# Patient Record
Sex: Male | Born: 1940
Health system: Southern US, Community
[De-identification: ages and names within clinical notes are randomized; demographics above are authoritative.]

## PROBLEM LIST (undated history)

## (undated) DIAGNOSIS — Z87442 Personal history of urinary calculi: Secondary | ICD-10-CM

## (undated) DIAGNOSIS — Z9221 Personal history of antineoplastic chemotherapy: Secondary | ICD-10-CM

## (undated) DIAGNOSIS — Z973 Presence of spectacles and contact lenses: Secondary | ICD-10-CM

## (undated) DIAGNOSIS — K219 Gastro-esophageal reflux disease without esophagitis: Secondary | ICD-10-CM

## (undated) DIAGNOSIS — I739 Peripheral vascular disease, unspecified: Secondary | ICD-10-CM

## (undated) DIAGNOSIS — J189 Pneumonia, unspecified organism: Secondary | ICD-10-CM

## (undated) DIAGNOSIS — C3490 Malignant neoplasm of unspecified part of unspecified bronchus or lung: Secondary | ICD-10-CM

## (undated) DIAGNOSIS — M1990 Unspecified osteoarthritis, unspecified site: Secondary | ICD-10-CM

## (undated) DIAGNOSIS — R32 Unspecified urinary incontinence: Secondary | ICD-10-CM

## (undated) DIAGNOSIS — H9190 Unspecified hearing loss, unspecified ear: Secondary | ICD-10-CM

## (undated) DIAGNOSIS — R0600 Dyspnea, unspecified: Secondary | ICD-10-CM

## (undated) DIAGNOSIS — Z8709 Personal history of other diseases of the respiratory system: Secondary | ICD-10-CM

## (undated) DIAGNOSIS — I6381 Other cerebral infarction due to occlusion or stenosis of small artery: Secondary | ICD-10-CM

## (undated) DIAGNOSIS — Z720 Tobacco use: Secondary | ICD-10-CM

## (undated) DIAGNOSIS — I2510 Atherosclerotic heart disease of native coronary artery without angina pectoris: Secondary | ICD-10-CM

## (undated) DIAGNOSIS — I7121 Aneurysm of the ascending aorta, without rupture: Secondary | ICD-10-CM

## (undated) DIAGNOSIS — I10 Essential (primary) hypertension: Secondary | ICD-10-CM

## (undated) DIAGNOSIS — N189 Chronic kidney disease, unspecified: Secondary | ICD-10-CM

## (undated) DIAGNOSIS — E119 Type 2 diabetes mellitus without complications: Secondary | ICD-10-CM

## (undated) DIAGNOSIS — E785 Hyperlipidemia, unspecified: Secondary | ICD-10-CM

## (undated) DIAGNOSIS — I214 Non-ST elevation (NSTEMI) myocardial infarction: Secondary | ICD-10-CM

## (undated) DIAGNOSIS — I712 Thoracic aortic aneurysm, without rupture: Secondary | ICD-10-CM

## (undated) HISTORY — DX: Tobacco use: Z72.0

## (undated) HISTORY — DX: Atherosclerotic heart disease of native coronary artery without angina pectoris: I25.10

## (undated) HISTORY — PX: COLONOSCOPY: SHX174

## (undated) HISTORY — DX: Peripheral vascular disease, unspecified: I73.9

## (undated) HISTORY — DX: Type 2 diabetes mellitus without complications: E11.9

## (undated) HISTORY — DX: Hyperlipidemia, unspecified: E78.5

## (undated) HISTORY — PX: APPENDECTOMY: SHX54

## (undated) HISTORY — DX: Essential (primary) hypertension: I10

---
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
## 1995-07-21 DIAGNOSIS — I2510 Atherosclerotic heart disease of native coronary artery without angina pectoris: Secondary | ICD-10-CM

## 1995-07-21 HISTORY — PX: CORONARY ANGIOPLASTY: SHX604

## 1995-07-21 HISTORY — DX: Atherosclerotic heart disease of native coronary artery without angina pectoris: I25.10

## 2008-03-14 ENCOUNTER — Encounter: Admission: RE | Admit: 2008-03-14 | Discharge: 2008-03-14 | Payer: Self-pay | Admitting: Internal Medicine

## 2012-08-25 ENCOUNTER — Other Ambulatory Visit (HOSPITAL_COMMUNITY): Payer: Self-pay | Admitting: Cardiovascular Disease

## 2012-08-25 DIAGNOSIS — I739 Peripheral vascular disease, unspecified: Secondary | ICD-10-CM

## 2012-09-13 ENCOUNTER — Encounter: Payer: Self-pay | Admitting: Cardiology

## 2012-09-21 ENCOUNTER — Encounter (HOSPITAL_COMMUNITY): Payer: Self-pay

## 2012-10-13 ENCOUNTER — Encounter: Payer: Self-pay | Admitting: Cardiovascular Disease

## 2013-04-24 ENCOUNTER — Other Ambulatory Visit: Payer: Self-pay | Admitting: Cardiovascular Disease

## 2013-04-25 NOTE — Telephone Encounter (Signed)
Rx was sent to pharmacy electronically. 

## 2013-05-11 ENCOUNTER — Other Ambulatory Visit: Payer: Self-pay | Admitting: Cardiovascular Disease

## 2013-05-12 NOTE — Telephone Encounter (Signed)
Rx was sent to pharmacy electronically. 

## 2013-09-04 ENCOUNTER — Other Ambulatory Visit: Payer: Self-pay | Admitting: Cardiovascular Disease

## 2013-09-07 ENCOUNTER — Other Ambulatory Visit: Payer: Self-pay

## 2013-09-07 MED ORDER — LOSARTAN POTASSIUM-HCTZ 100-25 MG PO TABS: 1.0000 | ORAL_TABLET | Freq: Every day | ORAL | Status: DC

## 2013-09-07 NOTE — Telephone Encounter (Signed)
Rx was sent to pharmacy electronically. 

## 2013-09-18 ENCOUNTER — Other Ambulatory Visit: Payer: Self-pay | Admitting: Cardiovascular Disease

## 2013-09-18 NOTE — Telephone Encounter (Signed)
E-sent rx ; For 2 more refill, appointment on 09/20/13

## 2013-09-20 ENCOUNTER — Encounter: Payer: Self-pay | Admitting: Cardiovascular Disease

## 2013-09-20 ENCOUNTER — Ambulatory Visit (INDEPENDENT_AMBULATORY_CARE_PROVIDER_SITE_OTHER): Payer: Medicare Other | Admitting: Cardiovascular Disease

## 2013-09-20 VITALS — BP 138/82 | HR 64 | Ht 70.0 in | Wt 179.0 lb

## 2013-09-20 DIAGNOSIS — E785 Hyperlipidemia, unspecified: Secondary | ICD-10-CM

## 2013-09-20 DIAGNOSIS — Z794 Long term (current) use of insulin: Secondary | ICD-10-CM

## 2013-09-20 DIAGNOSIS — I2510 Atherosclerotic heart disease of native coronary artery without angina pectoris: Secondary | ICD-10-CM | POA: Insufficient documentation

## 2013-09-20 DIAGNOSIS — I739 Peripheral vascular disease, unspecified: Secondary | ICD-10-CM | POA: Insufficient documentation

## 2013-09-20 DIAGNOSIS — IMO0001 Reserved for inherently not codable concepts without codable children: Secondary | ICD-10-CM | POA: Insufficient documentation

## 2013-09-20 DIAGNOSIS — Z9861 Coronary angioplasty status: Secondary | ICD-10-CM

## 2013-09-20 DIAGNOSIS — I10 Essential (primary) hypertension: Secondary | ICD-10-CM | POA: Insufficient documentation

## 2013-09-20 DIAGNOSIS — E119 Type 2 diabetes mellitus without complications: Secondary | ICD-10-CM

## 2013-09-20 NOTE — Assessment & Plan Note (Signed)
Well-controlled on current medications 

## 2013-09-20 NOTE — Assessment & Plan Note (Signed)
On statin therapy followed by his PCP 

## 2013-09-20 NOTE — Patient Instructions (Addendum)
  We will see you back in follow up in 1 year with Dr Gwenlyn Found  Dr Gwenlyn Found has ordered lower extremity arterial dopplers

## 2013-09-20 NOTE — Progress Notes (Signed)
09/20/2013 Arthur [name]   02-17-1941  [id number]  Primary Physician Salena Saner., MD Primary Cardiologist: Lorretta Harp MD [name]   HPI:  The patient is a 73 year old, mildly overweight, married Caucasian male, father of 16, grandfather to 62 grandchildren who I saw a year ago. He has a history of CAD status post circumflex intervention by myself May 20, 1996. He also had a 60% ostial ramus branch stenosis, a 70% distal RCA stenosis and normal LV function. His last functional study performed August 15, 2010, was nonischemic. He does continue to smoke 3 cigarettes a day. His other problems include hypertension, hyperlipidemia and noninsulin-requiring diabetes. Dr. Karlton Lemon follows his lipid profile. Since I saw him he ago he has complained of new onset bilateral calf claudication.    Current Outpatient Prescriptions  Medication Sig Dispense Refill  . aspirin 325 MG EC tablet Take 325 mg by mouth daily.      Marland Kitchen atorvastatin (LIPITOR) 20 MG tablet TAKE 1 TABLET EVERY DAY  30 tablet  2  . cholecalciferol (VITAMIN D) 1000 UNITS tablet Take 1,000 Units by mouth daily.      Marland Kitchen doxazosin (CARDURA) 1 MG tablet Take 1 mg by mouth 2 (two) times daily.      . fenofibrate 160 MG tablet TAKE 1 TABLET BY MOUTH AT BEDTIME FOR BLOOD FAT  30 tablet  2  . fish oil-omega-3 fatty acids 1000 MG capsule Take 4 g by mouth daily.      . insulin detemir (LEVEMIR) 100 UNIT/ML injection Inject 30 Units into the skin at bedtime.      Marland Kitchen losartan-hydrochlorothiazide (HYZAAR) 100-25 MG per tablet Take 1 tablet by mouth daily.  30 tablet  0  . metoprolol (LOPRESSOR) 50 MG tablet TAKE 1 TABLET BY MOUTH TWICE A DAY  60 tablet  0   No current facility-administered medications for this visit.    Not on File  History   Social History  . Marital Status: Unknown    Spouse Name: N/A    Number of Children: N/A  . Years of Education: N/A   Occupational History  . Not on file.   Social  History Main Topics  . Smoking status: Current Every Day Smoker -- 0.50 packs/day    Types: Cigarettes  . Smokeless tobacco: Not on file     Comment: 3 cigs a day  . Alcohol Use: No  . Drug Use: No  . Sexual Activity: Not on file   Other Topics Concern  . Not on file   Social History Narrative   Retired from [employer]. Married 2 children, 11 grand children, 6 great grand children     Review of Systems: General: negative for chills, fever, night sweats or weight changes.  Cardiovascular: negative for chest pain, dyspnea on exertion, edema, orthopnea, palpitations, paroxysmal nocturnal dyspnea or shortness of breath Dermatological: negative for rash Respiratory: negative for cough or wheezing Urologic: negative for hematuria Abdominal: negative for nausea, vomiting, diarrhea, bright red blood per rectum, melena, or hematemesis Neurologic: negative for visual changes, syncope, or dizziness All other systems reviewed and are otherwise negative except as noted above.    Blood pressure 138/82, pulse 64, height 5\' 10"  (1.778 m), weight 81.194 kg (179 lb).  General appearance: alert and no distress Neck: no adenopathy, no carotid bruit, no JVD, supple, symmetrical, trachea midline and thyroid not enlarged, symmetric, no tenderness/mass/nodules Lungs: clear to auscultation bilaterally Heart: regular rate and rhythm, S1, S2 normal, no murmur, click,  rub or gallop Extremities: extremities normal, atraumatic, no cyanosis or edema  EKG normal sinus rhythm at 64 without ST or T wave changes  ASSESSMENT AND PLAN:   Coronary artery disease Status post circumflex intervention by myself 05/20/96. He also has 60% ostial ramus branch stenosis and 70% distal RCA stenosis with normal LV function. A Myoview performed 08/15/10 with nonischemic. He denies chest pain or shortness of breath.  Essential hypertension Well-controlled on current medications  Hyperlipidemia On statin therapy followed by  his PCP  Claudication The patient relates symptoms compatible claudication while walking up a hill new since his prior office visit. He does have palpable pedal pulses. I'm going to get lower extremity arterial Doppler studies.      Lorretta Harp MD FACP,FACC,FAHA, [facility] 09/20/2013 5:16 PM

## 2013-09-20 NOTE — Assessment & Plan Note (Signed)
The patient relates symptoms compatible claudication while walking up a hill new since his prior office visit. He does have palpable pedal pulses. I'm going to get lower extremity arterial Doppler studies.

## 2013-09-20 NOTE — Assessment & Plan Note (Signed)
Status post circumflex intervention by myself 05/20/96. He also has 60% ostial ramus branch stenosis and 70% distal RCA stenosis with normal LV function. A Myoview performed 08/15/10 with nonischemic. He denies chest pain or shortness of breath.

## 2013-10-02 ENCOUNTER — Ambulatory Visit (HOSPITAL_COMMUNITY)
Admission: RE | Admit: 2013-10-02 | Discharge: 2013-10-02 | Disposition: A | Discharge status: Home / Self Care | Payer: Medicare Other | Source: Ambulatory Visit | Attending: Cardiovascular Disease | Admitting: Cardiovascular Disease

## 2013-10-02 ENCOUNTER — Other Ambulatory Visit: Payer: Self-pay | Admitting: *Deleted

## 2013-10-02 DIAGNOSIS — I70219 Atherosclerosis of native arteries of extremities with intermittent claudication, unspecified extremity: Secondary | ICD-10-CM | POA: Insufficient documentation

## 2013-10-02 DIAGNOSIS — I739 Peripheral vascular disease, unspecified: Secondary | ICD-10-CM

## 2013-10-02 MED ORDER — METOPROLOL TARTRATE 50 MG PO TABS: ORAL_TABLET | ORAL | Status: DC

## 2013-10-02 NOTE — Telephone Encounter (Signed)
Rx was sent to pharmacy electronically. 

## 2013-10-02 NOTE — Progress Notes (Signed)
Arterial Duplex Lower Ext. Completed. Kelle Ruppert, BS, RDMS, RVT  

## 2013-10-09 ENCOUNTER — Encounter: Payer: Self-pay | Admitting: *Deleted

## 2013-10-09 ENCOUNTER — Other Ambulatory Visit: Payer: Self-pay

## 2013-10-09 ENCOUNTER — Telehealth: Payer: Self-pay | Admitting: *Deleted

## 2013-10-09 DIAGNOSIS — I739 Peripheral vascular disease, unspecified: Secondary | ICD-10-CM

## 2013-10-09 MED ORDER — LOSARTAN POTASSIUM-HCTZ 100-25 MG PO TABS: 1.0000 | ORAL_TABLET | Freq: Every day | ORAL | Status: DC

## 2013-10-09 MED ORDER — ATORVASTATIN CALCIUM 20 MG PO TABS: 20.0000 mg | ORAL_TABLET | Freq: Every day | ORAL | Status: DC

## 2013-10-09 MED ORDER — METOPROLOL TARTRATE 50 MG PO TABS: ORAL_TABLET | ORAL | Status: DC

## 2013-10-09 MED ORDER — FENOFIBRATE 160 MG PO TABS: 160.0000 mg | ORAL_TABLET | Freq: Every day | ORAL | Status: DC

## 2013-10-09 NOTE — Telephone Encounter (Signed)
Message copied by Chauncy Lean on Mon Oct 09, 2013  9:59 PM ------      Message from: Lorretta Harp      Created: Mon Oct 09, 2013  7:06 AM       Nl ABIs. Mod distal LSFA stenosis that doesn't explain symptoms. Repeat 12 months ------

## 2013-10-09 NOTE — Telephone Encounter (Signed)
Rx was sent to pharmacy electronically. 

## 2013-10-09 NOTE — Addendum Note (Signed)
Addended by: Diana Eves on: 10/09/2013 04:11 PM   Modules accepted: Orders

## 2013-10-09 NOTE — Telephone Encounter (Signed)
Order placed for repeat lower extremity arterial doppler in 1 year

## 2014-04-30 ENCOUNTER — Other Ambulatory Visit: Payer: Self-pay | Admitting: Cardiovascular Disease

## 2014-04-30 NOTE — Telephone Encounter (Signed)
Rx was sent to pharmacy electronically. 

## 2014-07-23 ENCOUNTER — Other Ambulatory Visit: Payer: Self-pay | Admitting: Cardiovascular Disease

## 2014-09-12 ENCOUNTER — Other Ambulatory Visit: Payer: Self-pay | Admitting: Cardiovascular Disease

## 2014-09-12 NOTE — Telephone Encounter (Signed)
Rx(s) sent to pharmacy electronically. OV 09/21/14

## 2014-09-17 ENCOUNTER — Other Ambulatory Visit: Payer: Self-pay | Admitting: Cardiovascular Disease

## 2014-09-17 NOTE — Telephone Encounter (Signed)
Rx has been sent to the pharmacy electronically. ° °

## 2014-09-21 ENCOUNTER — Ambulatory Visit (INDEPENDENT_AMBULATORY_CARE_PROVIDER_SITE_OTHER): Payer: Medicare Other | Admitting: Cardiovascular Disease

## 2014-09-21 ENCOUNTER — Encounter: Payer: Self-pay | Admitting: Cardiovascular Disease

## 2014-09-21 VITALS — BP 140/62 | HR 61 | Ht 70.0 in | Wt 177.4 lb

## 2014-09-21 DIAGNOSIS — E785 Hyperlipidemia, unspecified: Secondary | ICD-10-CM

## 2014-09-21 DIAGNOSIS — I739 Peripheral vascular disease, unspecified: Secondary | ICD-10-CM

## 2014-09-21 DIAGNOSIS — I10 Essential (primary) hypertension: Secondary | ICD-10-CM | POA: Diagnosis not present

## 2014-09-21 DIAGNOSIS — I2583 Coronary atherosclerosis due to lipid rich plaque: Secondary | ICD-10-CM

## 2014-09-21 DIAGNOSIS — I2510 Atherosclerotic heart disease of native coronary artery without angina pectoris: Secondary | ICD-10-CM

## 2014-09-21 NOTE — Assessment & Plan Note (Signed)
History of hyperlipidemia on atorvastatin and fenofibrate followed by his PCP

## 2014-09-21 NOTE — Assessment & Plan Note (Signed)
History of hypertension with blood pressure measured at 140/62. He is on losartan hydrochlorothiazide, metoprolol and Cardura. Continue current meds at current dosing

## 2014-09-21 NOTE — Progress Notes (Signed)
09/21/2014 Arthur [name]   11-Aug-1940  [id number]  Primary Physician Salena Saner., MD Primary Cardiologist: Arthur Harp MD [name]   HPI:   The patient is a 74 year old, mildly overweight, married Caucasian male, father of 27, grandfather to 58 grandchildren who I saw a year ago. He has a history of CAD status post circumflex intervention by myself May 20, 1996. He also had a 60% ostial ramus branch stenosis, a 70% distal RCA stenosis and normal LV function. His last functional study performed August 15, 2010, was nonischemic. He does continue to smoke 3 cigarettes a day. His other problems include hypertension, hyperlipidemia and noninsulin-requiring diabetes. Dr. Karlton Lemon follows his lipid profile. Since I saw him he ago he has complained of new onset bilateral calf claudication. I obtained lower extremity arterial Doppler studies on 10/02/13 which revealed ABIs that were normal bilaterally with a high-frequency signal in his distal left SFA.   Current Outpatient Prescriptions  Medication Sig Dispense Refill  . aspirin 325 MG EC tablet Take 325 mg by mouth daily.    Marland Kitchen atorvastatin (LIPITOR) 20 MG tablet TAKE 1 TABLET (20 MG TOTAL) BY MOUTH DAILY. 90 tablet 0  . cholecalciferol (VITAMIN D) 1000 UNITS tablet Take 1,000 Units by mouth daily.    Marland Kitchen doxazosin (CARDURA) 1 MG tablet TAKE 2 TABLETS AT BEDTIME FOR BLOOD PRESSURE AND URINE FLOW 60 tablet 5  . fenofibrate 160 MG tablet Take 1 tablet (160 mg total) by mouth daily. 90 tablet 3  . fish oil-omega-3 fatty acids 1000 MG capsule Take 4 g by mouth daily.    . insulin detemir (LEVEMIR) 100 UNIT/ML injection Inject 30 Units into the skin 2 (two) times daily.     Marland Kitchen losartan-hydrochlorothiazide (HYZAAR) 100-25 MG per tablet Take 1 tablet by mouth daily. 90 tablet 3  . metoprolol (LOPRESSOR) 50 MG tablet TAKE 1 TABLET BY MOUTH TWICE A DAY 60 tablet 2   No current facility-administered medications for this visit.     No Known Allergies  History   Social History  . Marital Status: Unknown    Spouse Name: N/A  . Number of Children: N/A  . Years of Education: N/A   Occupational History  . Not on file.   Social History Main Topics  . Smoking status: Current Every Day Smoker -- 0.50 packs/day    Types: Cigarettes  . Smokeless tobacco: Not on file     Comment: 3 cigs a day  . Alcohol Use: No  . Drug Use: No  . Sexual Activity: Not on file   Other Topics Concern  . Not on file   Social History Narrative   Retired from [employer]. Married 2 children, 11 grand children, 6 great grand children     Review of Systems: General: negative for chills, fever, night sweats or weight changes.  Cardiovascular: negative for chest pain, dyspnea on exertion, edema, orthopnea, palpitations, paroxysmal nocturnal dyspnea or shortness of breath Dermatological: negative for rash Respiratory: negative for cough or wheezing Urologic: negative for hematuria Abdominal: negative for nausea, vomiting, diarrhea, bright red blood per rectum, melena, or hematemesis Neurologic: negative for visual changes, syncope, or dizziness All other systems reviewed and are otherwise negative except as noted above.    Blood pressure 140/62, pulse 61, height 5\' 10"  (1.778 m), weight 177 lb 6.4 oz (80.468 kg).  General appearance: alert and no distress Neck: no adenopathy, no carotid bruit, no JVD, supple, symmetrical, trachea midline and thyroid not enlarged, symmetric, no  tenderness/mass/nodules Lungs: clear to auscultation bilaterally Heart: regular rate and rhythm, S1, S2 normal, no murmur, click, rub or gallop Extremities: extremities normal, atraumatic, no cyanosis or edema  EKG normal sinus rhythm at 61 without ST or T-wave changes. I personally reviewed this EKG  ASSESSMENT AND PLAN:   Hyperlipidemia History of hyperlipidemia on atorvastatin and fenofibrate followed by his PCP   Essential hypertension History of  hypertension with blood pressure measured at 140/62. He is on losartan hydrochlorothiazide, metoprolol and Cardura. Continue current meds at current dosing   Coronary artery disease History of coronary artery disease status post circumflex intervention by myself 05/20/96. He did have a 50% ostial ramus branch stenosis, 70% distal RCA with normal LV function. His last ultrasound study performed 08/15/10 was nonischemic. He denies chest pain or shortness of breath   Claudication History of claudication with Dopplers performed 10/02/13 revealed a normal ABI in the right as well as the left with a high frequency signal in the distal left SFA. We will repeat lower extremity arterial Doppler studies       Arthur Harp MD [facility] 09/21/2014 10:36 AM

## 2014-09-21 NOTE — Assessment & Plan Note (Signed)
History of claudication with Dopplers performed 10/02/13 revealed a normal ABI in the right as well as the left with a high frequency signal in the distal left SFA. We will repeat lower extremity arterial Doppler studies

## 2014-09-21 NOTE — Assessment & Plan Note (Signed)
History of coronary artery disease status post circumflex intervention by myself 05/20/96. He did have a 50% ostial ramus branch stenosis, 70% distal RCA with normal LV function. His last ultrasound study performed 08/15/10 was nonischemic. He denies chest pain or shortness of breath

## 2014-09-21 NOTE — Patient Instructions (Signed)
Your physician wants you to follow-up in 1 year with Dr. Berry. You will receive a reminder letter in the mail 2 months in advance. If you do not receive a letter, please call our office to schedule the follow-up appointment.  

## 2014-09-24 ENCOUNTER — Other Ambulatory Visit: Payer: Self-pay | Admitting: *Deleted

## 2014-09-24 MED ORDER — LOSARTAN POTASSIUM-HCTZ 100-25 MG PO TABS: 1.0000 | ORAL_TABLET | Freq: Every day | ORAL | Status: DC

## 2014-09-24 MED ORDER — DOXAZOSIN MESYLATE 1 MG PO TABS: ORAL_TABLET | ORAL | Status: DC

## 2014-09-24 NOTE — Telephone Encounter (Signed)
[place] sent to pharmacy

## 2014-09-26 ENCOUNTER — Encounter: Payer: Self-pay | Admitting: Cardiovascular Disease

## 2014-09-27 DIAGNOSIS — Z125 Encounter for screening for malignant neoplasm of prostate: Secondary | ICD-10-CM | POA: Diagnosis not present

## 2014-09-27 DIAGNOSIS — I129 Hypertensive chronic kidney disease with stage 1 through stage 4 chronic kidney disease, or unspecified chronic kidney disease: Secondary | ICD-10-CM | POA: Diagnosis not present

## 2014-09-27 DIAGNOSIS — N183 Chronic kidney disease, stage 3 (moderate): Secondary | ICD-10-CM | POA: Diagnosis not present

## 2014-09-27 DIAGNOSIS — Z23 Encounter for immunization: Secondary | ICD-10-CM | POA: Diagnosis not present

## 2014-09-27 DIAGNOSIS — E782 Mixed hyperlipidemia: Secondary | ICD-10-CM | POA: Diagnosis not present

## 2014-09-28 ENCOUNTER — Encounter: Payer: Self-pay | Admitting: *Deleted

## 2014-09-28 ENCOUNTER — Telehealth (HOSPITAL_COMMUNITY): Payer: Self-pay | Admitting: *Deleted

## 2014-09-28 NOTE — Telephone Encounter (Signed)
Left message for patient to call back.  Dr. Gwenlyn Found is having the patient repeat the study to see if there are any changes from the previous study completed 12 months ago.

## 2014-09-28 NOTE — Telephone Encounter (Signed)
Pt would like to speak with the nurse regarding the LEA ordered by JB. He states that he just had one last year and doesn't understand why he needs another one

## 2014-10-01 ENCOUNTER — Other Ambulatory Visit: Payer: Self-pay | Admitting: Cardiovascular Disease

## 2014-10-01 NOTE — Telephone Encounter (Signed)
Spoke with pt, aware of reason for repeat. He will go ahead and schedule.  Will forward to ebony

## 2014-10-01 NOTE — Telephone Encounter (Signed)
Returning call from Friday.

## 2014-10-01 NOTE — Telephone Encounter (Signed)
Rx has been sent to the pharmacy electronically. ° °

## 2014-10-05 ENCOUNTER — Telehealth (HOSPITAL_COMMUNITY): Payer: Self-pay | Admitting: *Deleted

## 2014-10-15 ENCOUNTER — Other Ambulatory Visit: Payer: Self-pay | Admitting: Cardiovascular Disease

## 2014-10-15 ENCOUNTER — Ambulatory Visit (HOSPITAL_COMMUNITY)
Admission: RE | Admit: 2014-10-15 | Discharge: 2014-10-15 | Disposition: A | Discharge status: Home / Self Care | Payer: Medicare Other | Source: Ambulatory Visit | Attending: Cardiology | Admitting: Cardiology

## 2014-10-15 DIAGNOSIS — I739 Peripheral vascular disease, unspecified: Secondary | ICD-10-CM | POA: Insufficient documentation

## 2014-10-15 NOTE — Progress Notes (Signed)
Arterial Lower Ext. Duplex Completed. Rumaldo Difatta, BS, RDMS, RVT  

## 2014-10-16 NOTE — Telephone Encounter (Signed)
Rx refill sent to patient pharmacy   

## 2014-10-19 ENCOUNTER — Other Ambulatory Visit: Payer: Self-pay | Admitting: Cardiovascular Disease

## 2014-10-19 ENCOUNTER — Encounter: Payer: Self-pay | Admitting: Cardiovascular Disease

## 2014-10-19 NOTE — Telephone Encounter (Signed)
This encounter was created in error - please disregard.

## 2014-10-19 NOTE — Telephone Encounter (Signed)
Rx(s) sent to pharmacy electronically.  

## 2014-10-19 NOTE — Telephone Encounter (Signed)
Patient is returning call regarding his doppler result.

## 2014-12-06 DIAGNOSIS — N183 Chronic kidney disease, stage 3 (moderate): Secondary | ICD-10-CM | POA: Diagnosis not present

## 2014-12-06 DIAGNOSIS — E1121 Type 2 diabetes mellitus with diabetic nephropathy: Secondary | ICD-10-CM | POA: Diagnosis not present

## 2014-12-06 DIAGNOSIS — N2581 Secondary hyperparathyroidism of renal origin: Secondary | ICD-10-CM | POA: Diagnosis not present

## 2014-12-06 DIAGNOSIS — I129 Hypertensive chronic kidney disease with stage 1 through stage 4 chronic kidney disease, or unspecified chronic kidney disease: Secondary | ICD-10-CM | POA: Diagnosis not present

## 2014-12-10 ENCOUNTER — Other Ambulatory Visit: Payer: Self-pay | Admitting: Cardiovascular Disease

## 2014-12-10 NOTE — Telephone Encounter (Signed)
Rx has been sent to the pharmacy electronically. ° °

## 2014-12-12 ENCOUNTER — Other Ambulatory Visit: Payer: Self-pay | Admitting: Cardiovascular Disease

## 2014-12-12 ENCOUNTER — Telehealth: Payer: Self-pay | Admitting: Cardiovascular Disease

## 2014-12-12 NOTE — Telephone Encounter (Signed)
Rx(s) sent to pharmacy electronically.  

## 2014-12-12 NOTE — Telephone Encounter (Signed)
°  1. Which medications need to be refilled? Doxazosin-need new prescription  2. Which pharmacy is medication to be sent to? [phone]  3. Do they need a 30 day or 90 day supply? #[id number] and refills  4. Would they like a call back once the medication has been sent to the pharmacy? no

## 2014-12-12 NOTE — Telephone Encounter (Signed)
Spoke to pharmacist  She states the patient is taking 3 tablets daily. Dr Baird Cancer increase medication per patient. Record shows  2 tablet at bedtime. Pharmacist states she will contact  Dr Baird Cancer.

## 2014-12-28 DIAGNOSIS — H9190 Unspecified hearing loss, unspecified ear: Secondary | ICD-10-CM | POA: Diagnosis not present

## 2014-12-28 DIAGNOSIS — N183 Chronic kidney disease, stage 3 (moderate): Secondary | ICD-10-CM | POA: Diagnosis not present

## 2014-12-28 DIAGNOSIS — N08 Glomerular disorders in diseases classified elsewhere: Secondary | ICD-10-CM | POA: Diagnosis not present

## 2014-12-28 DIAGNOSIS — E1122 Type 2 diabetes mellitus with diabetic chronic kidney disease: Secondary | ICD-10-CM | POA: Diagnosis not present

## 2014-12-28 DIAGNOSIS — H6122 Impacted cerumen, left ear: Secondary | ICD-10-CM | POA: Diagnosis not present

## 2014-12-28 DIAGNOSIS — E782 Mixed hyperlipidemia: Secondary | ICD-10-CM | POA: Diagnosis not present

## 2015-01-08 DIAGNOSIS — R350 Frequency of micturition: Secondary | ICD-10-CM | POA: Diagnosis not present

## 2015-01-08 DIAGNOSIS — R3911 Hesitancy of micturition: Secondary | ICD-10-CM | POA: Diagnosis not present

## 2015-04-01 DIAGNOSIS — N08 Glomerular disorders in diseases classified elsewhere: Secondary | ICD-10-CM | POA: Diagnosis not present

## 2015-04-01 DIAGNOSIS — N183 Chronic kidney disease, stage 3 (moderate): Secondary | ICD-10-CM | POA: Diagnosis not present

## 2015-04-01 DIAGNOSIS — E782 Mixed hyperlipidemia: Secondary | ICD-10-CM | POA: Diagnosis not present

## 2015-04-01 DIAGNOSIS — Z23 Encounter for immunization: Secondary | ICD-10-CM | POA: Diagnosis not present

## 2015-04-01 DIAGNOSIS — I129 Hypertensive chronic kidney disease with stage 1 through stage 4 chronic kidney disease, or unspecified chronic kidney disease: Secondary | ICD-10-CM | POA: Diagnosis not present

## 2015-04-01 DIAGNOSIS — E1122 Type 2 diabetes mellitus with diabetic chronic kidney disease: Secondary | ICD-10-CM | POA: Diagnosis not present

## 2015-04-16 ENCOUNTER — Other Ambulatory Visit: Payer: Self-pay | Admitting: Nephrology

## 2015-04-16 DIAGNOSIS — N183 Chronic kidney disease, stage 3 unspecified: Secondary | ICD-10-CM

## 2015-04-23 ENCOUNTER — Ambulatory Visit
Admission: RE | Admit: 2015-04-23 | Discharge: 2015-04-23 | Disposition: A | Discharge status: Home / Self Care | Payer: Medicare Other | Source: Ambulatory Visit | Attending: Nephrology | Admitting: Nephrology

## 2015-04-23 DIAGNOSIS — N189 Chronic kidney disease, unspecified: Secondary | ICD-10-CM | POA: Diagnosis not present

## 2015-04-23 DIAGNOSIS — N183 Chronic kidney disease, stage 3 unspecified: Secondary | ICD-10-CM

## 2015-06-17 ENCOUNTER — Other Ambulatory Visit: Payer: Self-pay | Admitting: Cardiovascular Disease

## 2015-06-17 NOTE — Telephone Encounter (Signed)
Rx(s) sent to pharmacy electronically.  

## 2015-06-27 DIAGNOSIS — I129 Hypertensive chronic kidney disease with stage 1 through stage 4 chronic kidney disease, or unspecified chronic kidney disease: Secondary | ICD-10-CM | POA: Diagnosis not present

## 2015-06-27 DIAGNOSIS — E782 Mixed hyperlipidemia: Secondary | ICD-10-CM | POA: Diagnosis not present

## 2015-06-27 DIAGNOSIS — N183 Chronic kidney disease, stage 3 (moderate): Secondary | ICD-10-CM | POA: Diagnosis not present

## 2015-06-27 DIAGNOSIS — E1122 Type 2 diabetes mellitus with diabetic chronic kidney disease: Secondary | ICD-10-CM | POA: Diagnosis not present

## 2015-08-01 DIAGNOSIS — E1121 Type 2 diabetes mellitus with diabetic nephropathy: Secondary | ICD-10-CM | POA: Diagnosis not present

## 2015-08-01 DIAGNOSIS — N183 Chronic kidney disease, stage 3 (moderate): Secondary | ICD-10-CM | POA: Diagnosis not present

## 2015-08-01 DIAGNOSIS — D509 Iron deficiency anemia, unspecified: Secondary | ICD-10-CM | POA: Diagnosis not present

## 2015-08-01 DIAGNOSIS — N2581 Secondary hyperparathyroidism of renal origin: Secondary | ICD-10-CM | POA: Diagnosis not present

## 2015-08-01 DIAGNOSIS — I129 Hypertensive chronic kidney disease with stage 1 through stage 4 chronic kidney disease, or unspecified chronic kidney disease: Secondary | ICD-10-CM | POA: Diagnosis not present

## 2015-10-04 DIAGNOSIS — E782 Mixed hyperlipidemia: Secondary | ICD-10-CM | POA: Diagnosis not present

## 2015-10-04 DIAGNOSIS — I129 Hypertensive chronic kidney disease with stage 1 through stage 4 chronic kidney disease, or unspecified chronic kidney disease: Secondary | ICD-10-CM | POA: Diagnosis not present

## 2015-10-04 DIAGNOSIS — N183 Chronic kidney disease, stage 3 (moderate): Secondary | ICD-10-CM | POA: Diagnosis not present

## 2015-10-04 DIAGNOSIS — H6123 Impacted cerumen, bilateral: Secondary | ICD-10-CM | POA: Diagnosis not present

## 2015-10-04 DIAGNOSIS — E1122 Type 2 diabetes mellitus with diabetic chronic kidney disease: Secondary | ICD-10-CM | POA: Diagnosis not present

## 2015-10-05 ENCOUNTER — Other Ambulatory Visit: Payer: Self-pay | Admitting: Cardiovascular Disease

## 2015-10-07 NOTE — Telephone Encounter (Signed)
Rx(s) sent to pharmacy electronically.  

## 2015-11-01 ENCOUNTER — Other Ambulatory Visit: Payer: Self-pay | Admitting: Cardiovascular Disease

## 2015-11-01 NOTE — Telephone Encounter (Signed)
Rx(s) sent to pharmacy electronically.  

## 2015-11-02 ENCOUNTER — Other Ambulatory Visit: Payer: Self-pay | Admitting: Cardiovascular Disease

## 2015-11-04 ENCOUNTER — Other Ambulatory Visit: Payer: Self-pay | Admitting: Cardiovascular Disease

## 2015-11-04 NOTE — Telephone Encounter (Signed)
Rx(s) sent to pharmacy electronically.  

## 2015-11-25 ENCOUNTER — Other Ambulatory Visit: Payer: Self-pay | Admitting: Cardiovascular Disease

## 2015-11-25 NOTE — Telephone Encounter (Signed)
Rx Refill

## 2015-12-09 ENCOUNTER — Other Ambulatory Visit: Payer: Self-pay | Admitting: Cardiovascular Disease

## 2015-12-10 NOTE — Telephone Encounter (Signed)
Rx(s) sent to pharmacy electronically.  

## 2015-12-23 ENCOUNTER — Other Ambulatory Visit: Payer: Self-pay | Admitting: Cardiovascular Disease

## 2015-12-23 NOTE — Telephone Encounter (Signed)
No more refills will be given until pt schedules a follow up appointment. Please call [facility] at [phone] to schedule your follow up appointment with Dr. Gwenlyn Found. 2nd attempt

## 2015-12-24 ENCOUNTER — Ambulatory Visit (INDEPENDENT_AMBULATORY_CARE_PROVIDER_SITE_OTHER): Payer: Medicare Other | Admitting: Cardiovascular Disease

## 2015-12-24 ENCOUNTER — Encounter: Payer: Self-pay | Admitting: Cardiovascular Disease

## 2015-12-24 VITALS — BP 132/58 | HR 57 | Ht 70.0 in | Wt 172.6 lb

## 2015-12-24 DIAGNOSIS — I739 Peripheral vascular disease, unspecified: Secondary | ICD-10-CM

## 2015-12-24 DIAGNOSIS — Z79899 Other long term (current) drug therapy: Secondary | ICD-10-CM | POA: Diagnosis not present

## 2015-12-24 DIAGNOSIS — E785 Hyperlipidemia, unspecified: Secondary | ICD-10-CM | POA: Diagnosis not present

## 2015-12-24 DIAGNOSIS — I10 Essential (primary) hypertension: Secondary | ICD-10-CM

## 2015-12-24 DIAGNOSIS — Z0000 Encounter for general adult medical examination without abnormal findings: Secondary | ICD-10-CM

## 2015-12-24 NOTE — Assessment & Plan Note (Signed)
History of hyperlipidemia on statin therapy. We will recheck a lipid and liver profile 

## 2015-12-24 NOTE — Assessment & Plan Note (Signed)
He patient complains of right lower extremity claudication. He did have lower extremity Dopplers performed 10/15/14 revealing a right ABI of 1 and a left upper and 86 with a high-frequency signal in his distal left SFA. He now complains of right lower extremity claudication. We will check lower extremity arterial Doppler studies.

## 2015-12-24 NOTE — Assessment & Plan Note (Signed)
History of CAD status post circumflex intervention which I performed 05/20/1996. He also had a 60% ostial ramus branch stenosis, 70% distal RCA stenosis and normal LV function. His last functional study performed 08/15/10 was nonischemic. He denies chest pain or shortness of breath.

## 2015-12-24 NOTE — Patient Instructions (Signed)
Medication Instructions:  Your physician recommends that you continue on your current medications as directed. Please refer to the Current Medication list given to you today.   Labwork: Your physician recommends that you return for lab work AT [facility]. The lab can be found on the FIRST FLOOR of out building in [address]   Testing/Procedures: Your physician has requested that you have a lower extremity arterial doppler- During this test, ultrasound is used to evaluate arterial blood flow in the legs. Allow approximately one hour for this exam.    Follow-Up: Your physician wants you to follow-up in: [place]. You will receive a reminder letter in the mail two months in advance. If you don't receive a letter, please call our office to schedule the follow-up appointment.   Any Other Special Instructions Will Be Listed Below (If Applicable).     If you need a refill on your cardiac medications before your next appointment, please call your pharmacy.

## 2015-12-24 NOTE — Assessment & Plan Note (Signed)
History of hypertension blood pressure measured at 132/58. He is on losartan, hydrochlorothiazide and metoprolol Continue current meds at current dosing

## 2015-12-24 NOTE — Progress Notes (Signed)
12/24/2015 Arthur [name]   January 17, 1941  [id number]  Primary Physician Salena Saner., MD Primary Cardiologist: Lorretta Harp MD Arthur [name]   HPI:   The patient is a 75 year old, mildly overweight, married Caucasian male, father of 2, grandfather to 73 grandchildren who I saw 3 /4/16. He has a history of CAD status post circumflex intervention by myself May 20, 1996. He also had a 60% ostial ramus branch stenosis, a 70% distal RCA stenosis and normal LV function. His last functional study performed August 15, 2010, was nonischemic. He does continue to smoke 3 cigarettes a day. His other problems include hypertension, hyperlipidemia and noninsulin-requiring diabetes. Dr. Karlton Lemon follows his lipid profile. . I obtained lower extremity arterial Doppler studies on 10/15/14 which revealed ABIs showing a right ABI of 1.86. There was a high-frequency signal in his distal left SFA. He now complains of right lower extremity claudication. He denies chest pain or shortness of breath.   Current Outpatient Prescriptions  Medication Sig Dispense Refill  . aspirin 325 MG EC tablet Take 325 mg by mouth daily.    Marland Kitchen atorvastatin (LIPITOR) 20 MG tablet TAKE 1 TABLET (20 MG TOTAL) BY MOUTH DAILY. 90 tablet 2  . cholecalciferol (VITAMIN D) 1000 UNITS tablet Take 1,000 Units by mouth daily.    Marland Kitchen doxazosin (CARDURA) 1 MG tablet TAKE 2 TABLETS AT BEDTIME FOR BLOOD PRESSURE AND URINE FLOW 180 tablet 3  . fenofibrate 160 MG tablet Take 1 tablet (160 mg total) by mouth daily. 30 tablet 5  . fish oil-omega-3 fatty acids 1000 MG capsule Take 4 g by mouth daily.    . insulin detemir (LEVEMIR) 100 UNIT/ML injection Inject 30 Units into the skin 2 (two) times daily.     Marland Kitchen losartan-hydrochlorothiazide (HYZAAR) 100-25 MG tablet TAKE 1 TABLET BY MOUTH DAILY. <PLEASE MAKE APPOINTMENT FOR REFILLS> 15 tablet 0  . metoprolol (LOPRESSOR) 50 MG tablet TAKE 1 TABLET BY MOUTH TWICE A DAY 60 tablet 2  . metoprolol  (LOPRESSOR) 50 MG tablet Take 1 tablet (50 mg total) by mouth 2 (two) times daily. <PLEASE MAKE APPOINTMENT FOR REFILLS> 180 tablet 0  . ULTICARE MICRO PEN NEEDLES 32G X 4 MM MISC CHECK BLOOD SUGARS 3X DAILY  11   No current facility-administered medications for this visit.    No Known Allergies  Social History   Social History  . Marital Status: Married    Spouse Name: N/A  . Number of Children: N/A  . Years of Education: N/A   Occupational History  . Not on file.   Social History Main Topics  . Smoking status: Current Every Day Smoker -- 0.50 packs/day    Types: Cigarettes  . Smokeless tobacco: Not on file     Comment: 3 cigs a day  . Alcohol Use: No  . Drug Use: No  . Sexual Activity: Not on file   Other Topics Concern  . Not on file   Social History Narrative   Retired from [employer]. Married 2 children, 11 grand children, 6 great grand children     Review of Systems: General: negative for chills, fever, night sweats or weight changes.  Cardiovascular: negative for chest pain, dyspnea on exertion, edema, orthopnea, palpitations, paroxysmal nocturnal dyspnea or shortness of breath Dermatological: negative for rash Respiratory: negative for cough or wheezing Urologic: negative for hematuria Abdominal: negative for nausea, vomiting, diarrhea, bright red blood per rectum, melena, or hematemesis Neurologic: negative for visual changes, syncope, or dizziness All other systems  reviewed and are otherwise negative except as noted above.    Blood pressure 132/58, pulse 57, height '5\' 10"'$  (1.778 m), weight 172 lb 9.6 oz (78.291 kg).  General appearance: alert and no distress Neck: no adenopathy, no carotid bruit, no JVD, supple, symmetrical, trachea midline and thyroid not enlarged, symmetric, no tenderness/mass/nodules Lungs: clear to auscultation bilaterally Heart: regular rate and rhythm, S1, S2 normal, no murmur, click, rub or gallop Extremities: extremities normal,  atraumatic, no cyanosis or edema  EKG sinus bradycardia at 57 without ST or T-wave changes. I personally reviewed this EKG  ASSESSMENT AND PLAN:   Coronary artery disease History of CAD status post circumflex intervention which I performed 05/20/1996. He also had a 60% ostial ramus branch stenosis, 70% distal RCA stenosis and normal LV function. His last functional study performed 08/15/10 was nonischemic. He denies chest pain or shortness of breath.  Essential hypertension History of hypertension blood pressure measured at 132/58. He is on losartan, hydrochlorothiazide and metoprolol Continue current meds at current dosing  Hyperlipidemia History of hyperlipidemia on statin therapy. We will recheck a lipid and liver profile  Claudication He patient complains of right lower extremity claudication. He did have lower extremity Dopplers performed 10/15/14 revealing a right ABI of 1 and a left upper and 86 with a high-frequency signal in his distal left SFA. He now complains of right lower extremity claudication. We will check lower extremity arterial Doppler studies.      Lorretta Harp MD FACP,FACC,FAHA, [facility] 12/24/2015 8:15 AM

## 2015-12-25 ENCOUNTER — Other Ambulatory Visit: Payer: Self-pay | Admitting: Cardiovascular Disease

## 2015-12-25 NOTE — Telephone Encounter (Signed)
Rx(s) sent to pharmacy electronically.  

## 2015-12-31 DIAGNOSIS — I10 Essential (primary) hypertension: Secondary | ICD-10-CM | POA: Diagnosis not present

## 2015-12-31 DIAGNOSIS — Z79899 Other long term (current) drug therapy: Secondary | ICD-10-CM | POA: Diagnosis not present

## 2015-12-31 DIAGNOSIS — I739 Peripheral vascular disease, unspecified: Secondary | ICD-10-CM | POA: Diagnosis not present

## 2015-12-31 DIAGNOSIS — Z0000 Encounter for general adult medical examination without abnormal findings: Secondary | ICD-10-CM | POA: Diagnosis not present

## 2015-12-31 DIAGNOSIS — E785 Hyperlipidemia, unspecified: Secondary | ICD-10-CM | POA: Diagnosis not present

## 2016-01-01 LAB — LIPID PANEL
CHOL/HDL RATIO: 4.1 ratio (ref <=5.0)
CHOLESTEROL: 136 mg/dL (ref 125–200)
HDL: 33 mg/dL — ABNORMAL LOW (ref >=40)
LDL Cholesterol: 71 mg/dL (ref <130)
Triglycerides: 160 mg/dL — ABNORMAL HIGH (ref <150)
VLDL: 32 mg/dL — ABNORMAL HIGH (ref <30)

## 2016-01-01 LAB — HEPATIC FUNCTION PANEL
ALK PHOS: 29 U/L — AB (ref 40–115)
ALT: 18 U/L (ref 9–46)
AST: 19 U/L (ref 10–35)
Albumin: 3.9 g/dL (ref 3.6–5.1)
BILIRUBIN DIRECT: 0.2 mg/dL (ref <=0.2)
BILIRUBIN INDIRECT: 0.5 mg/dL (ref 0.2–1.2)
Total Bilirubin: 0.7 mg/dL (ref 0.2–1.2)
Total Protein: 6.9 g/dL (ref 6.1–8.1)

## 2016-01-06 ENCOUNTER — Other Ambulatory Visit: Payer: Self-pay | Admitting: Cardiovascular Disease

## 2016-01-06 DIAGNOSIS — I739 Peripheral vascular disease, unspecified: Secondary | ICD-10-CM

## 2016-01-28 ENCOUNTER — Ambulatory Visit (HOSPITAL_COMMUNITY)
Admission: RE | Admit: 2016-01-28 | Discharge: 2016-01-28 | Disposition: A | Discharge status: Home / Self Care | Payer: Medicare Other | Source: Ambulatory Visit | Attending: Cardiology | Admitting: Cardiology

## 2016-01-28 ENCOUNTER — Other Ambulatory Visit: Payer: Self-pay

## 2016-01-28 DIAGNOSIS — I739 Peripheral vascular disease, unspecified: Secondary | ICD-10-CM | POA: Insufficient documentation

## 2016-01-28 DIAGNOSIS — Z79899 Other long term (current) drug therapy: Secondary | ICD-10-CM | POA: Diagnosis not present

## 2016-01-28 DIAGNOSIS — E119 Type 2 diabetes mellitus without complications: Secondary | ICD-10-CM | POA: Insufficient documentation

## 2016-01-28 DIAGNOSIS — Z720 Tobacco use: Secondary | ICD-10-CM | POA: Insufficient documentation

## 2016-01-28 DIAGNOSIS — Z0000 Encounter for general adult medical examination without abnormal findings: Secondary | ICD-10-CM | POA: Insufficient documentation

## 2016-01-28 DIAGNOSIS — I10 Essential (primary) hypertension: Secondary | ICD-10-CM | POA: Insufficient documentation

## 2016-01-28 DIAGNOSIS — E785 Hyperlipidemia, unspecified: Secondary | ICD-10-CM | POA: Diagnosis not present

## 2016-01-28 DIAGNOSIS — R938 Abnormal findings on diagnostic imaging of other specified body structures: Secondary | ICD-10-CM | POA: Diagnosis not present

## 2016-01-28 DIAGNOSIS — I70203 Unspecified atherosclerosis of native arteries of extremities, bilateral legs: Secondary | ICD-10-CM | POA: Diagnosis not present

## 2016-01-28 DIAGNOSIS — I2510 Atherosclerotic heart disease of native coronary artery without angina pectoris: Secondary | ICD-10-CM | POA: Insufficient documentation

## 2016-02-03 ENCOUNTER — Other Ambulatory Visit: Payer: Self-pay | Admitting: Cardiovascular Disease

## 2016-02-03 NOTE — Telephone Encounter (Signed)
Rx(s) sent to pharmacy electronically.  

## 2016-02-07 ENCOUNTER — Ambulatory Visit (INDEPENDENT_AMBULATORY_CARE_PROVIDER_SITE_OTHER): Payer: Medicare Other | Admitting: Cardiovascular Disease

## 2016-02-07 ENCOUNTER — Encounter: Payer: Self-pay | Admitting: Cardiovascular Disease

## 2016-02-07 ENCOUNTER — Other Ambulatory Visit: Payer: Self-pay | Admitting: *Deleted

## 2016-02-07 VITALS — BP 130/66 | HR 64 | Ht 70.0 in | Wt 173.2 lb

## 2016-02-07 DIAGNOSIS — Z0000 Encounter for general adult medical examination without abnormal findings: Secondary | ICD-10-CM

## 2016-02-07 DIAGNOSIS — I739 Peripheral vascular disease, unspecified: Secondary | ICD-10-CM

## 2016-02-07 DIAGNOSIS — Z79899 Other long term (current) drug therapy: Secondary | ICD-10-CM

## 2016-02-07 DIAGNOSIS — Z01818 Encounter for other preprocedural examination: Secondary | ICD-10-CM | POA: Diagnosis not present

## 2016-02-07 DIAGNOSIS — D689 Coagulation defect, unspecified: Secondary | ICD-10-CM

## 2016-02-07 NOTE — Patient Instructions (Addendum)
Medication Instructions:  Your physician recommends that you continue on your current medications as directed. Please refer to the Current Medication list given to you today.   Testing/Procedures: Dr. Gwenlyn Found has ordered a peripheral angiogram to be done at [facility].  This procedure is going to look at the bloodflow in your lower extremities.  If Dr. Gwenlyn Found is able to open up the arteries, you will have to spend one night in the hospital.  If he is not able to open the arteries, you will be able to go home that same day.  July 24 OR 27 OR 31.  After the procedure, you will not be allowed to drive for 3 days or push, pull, or lift anything greater than 10 lbs for one week.    You will be required to have the following tests prior to the procedure:  1. Blood work-the blood work can be done no more than 14 days prior to the procedure.  It can be done at any [facility] lab.  There is one downstairs on the first floor of this building and one in the [facility] building [phone] [address])  2. Chest Xray-the chest xray order has already been placed at the [facility].     *REPS  SCOTT  Puncture site LEFT GROIN   If you need a refill on your cardiac medications before your next appointment, please call your pharmacy.

## 2016-02-07 NOTE — Progress Notes (Signed)
02/07/2016 Arthur [name]   [birth year]  [id number]  Primary Physician Arthur [name]., MD Primary Cardiologist: Arthur Harp MD Arthur [name]  HPI:  The patient is a 75 year old, mildly overweight, married Caucasian male, father of 2, grandfather to 48 grandchildren who I saw 12/24/15. He has a history of CAD status post circumflex intervention by myself May 20, 1996. He also had a 60% ostial ramus branch stenosis, a 70% distal RCA stenosis and normal LV function. His last functional study performed August 15, 2010, was nonischemic. He does continue to smoke 3 cigarettes a day. His other problems include hypertension, hyperlipidemia and noninsulin-requiring diabetes. Dr. Karlton [name] follows his lipid profile. . I obtained lower extremity arterial Doppler studies on 10/15/14 which revealed ABIs showing a right ABI of 1.86. There was a high-frequency signal in his distal left SFA. He now complains of right lower extremity claudication. He denies chest pain or shortness of breath. Dopplers performed 01/28/16 revealed a decline in his right ABI from one down to 0.56 with a new high-frequency signal in his right common femoral artery.    Current Outpatient Prescriptions  Medication Sig Dispense Refill  . aspirin 325 MG EC tablet Take 325 mg by mouth daily.    Marland Kitchen atorvastatin (LIPITOR) 20 MG tablet TAKE 1 TABLET (20 MG TOTAL) BY MOUTH DAILY. 90 tablet 2  . cholecalciferol (VITAMIN D) 1000 UNITS tablet Take 1,000 Units by mouth daily.    Marland Kitchen doxazosin (CARDURA) 1 MG tablet TAKE 2 TABLETS AT BEDTIME FOR BLOOD PRESSURE AND URINE FLOW 180 tablet 3  . doxazosin (CARDURA) 1 MG tablet Take 1 mg by mouth daily. 3 tablets daily    . fenofibrate 160 MG tablet Take 1 tablet (160 mg total) by mouth daily. 30 tablet 5  . fish oil-omega-3 fatty acids 1000 MG capsule Take 4 g by mouth daily.    . insulin detemir (LEVEMIR) 100 UNIT/ML injection Inject 30 Units into the skin 2 (two) times daily.     Marland Kitchen  losartan-hydrochlorothiazide (HYZAAR) 100-25 MG tablet TAKE 1 TABLET BY MOUTH DAILY. <PLEASE MAKE APPOINTMENT FOR REFILLS> 15 tablet 0  . metoprolol (LOPRESSOR) 50 MG tablet TAKE 1 TABLET BY MOUTH TWICE A DAY 60 tablet 2  . metoprolol (LOPRESSOR) 50 MG tablet Take 1 tablet (50 mg total) by mouth 2 (two) times daily. 180 tablet 3  . ULTICARE MICRO PEN NEEDLES 32G X 4 MM MISC CHECK BLOOD SUGARS 3X DAILY  11   No current facility-administered medications for this visit.    No Known Allergies  Social History   Social History  . Marital Status: Married    Spouse Name: N/A  . Number of Children: N/A  . Years of Education: N/A   Occupational History  . Not on file.   Social History Main Topics  . Smoking status: Current Every Day Smoker -- 0.50 packs/day    Types: Cigarettes  . Smokeless tobacco: Not on file     Comment: 3 cigs a day  . Alcohol Use: No  . Drug Use: No  . Sexual Activity: Not on file   Other Topics Concern  . Not on file   Social History Narrative   Retired from [employer]. Married 2 children, 11 grand children, 6 great grand children     Review of Systems: General: negative for chills, fever, night sweats or weight changes.  Cardiovascular: negative for chest pain, dyspnea on exertion, edema, orthopnea, palpitations, paroxysmal nocturnal dyspnea or shortness of breath  Dermatological: negative for rash Respiratory: negative for cough or wheezing Urologic: negative for hematuria Abdominal: negative for nausea, vomiting, diarrhea, bright red blood per rectum, melena, or hematemesis Neurologic: negative for visual changes, syncope, or dizziness All other systems reviewed and are otherwise negative except as noted above.    Blood pressure 130/66, pulse 64, height '5\' 10"'$  (1.778 m), weight 173 lb 3.2 oz (78.563 kg), SpO2 96 %.  General appearance: alert and no distress Neck: no adenopathy, no carotid bruit, no JVD, supple, symmetrical, trachea midline and thyroid  not enlarged, symmetric, no tenderness/mass/nodules Lungs: clear to auscultation bilaterally Heart: regular rate and rhythm, S1, S2 normal, no murmur, click, rub or gallop Extremities: extremities normal, atraumatic, no cyanosis or edema  EKG not performed today  ASSESSMENT AND PLAN:   Claudication [name] has been complaining of right lower extreme claudication for last 6 months. Recent Dopplers performed 01/28/16 revealed a right ABI 0.56 which is reduced from 1.0 on a Doppler performed 3/16. He does have a high-frequency signal in his right common femoral artery. Based on this I recommended that we proceed with angiography and potential intervention.      Arthur Harp MD FACP,FACC,FAHA, [facility] 02/07/2016 10:52 AM

## 2016-02-07 NOTE — Addendum Note (Signed)
Addended by: Vanessa Ralphs on: 02/07/2016 10:59 AM   Modules accepted: Orders

## 2016-02-07 NOTE — Assessment & Plan Note (Signed)
Arthur [name] has been complaining of right lower extreme claudication for last 6 months. Recent Dopplers performed 01/28/16 revealed a right ABI 0.56 which is reduced from 1.0 on a Doppler performed 3/16. He does have a high-frequency signal in his right common femoral artery. Based on this I recommended that we proceed with angiography and potential intervention.

## 2016-02-13 ENCOUNTER — Ambulatory Visit
Admission: RE | Admit: 2016-02-13 | Discharge: 2016-02-13 | Disposition: A | Discharge status: Home / Self Care | Payer: Medicare Other | Source: Ambulatory Visit | Attending: Cardiovascular Disease | Admitting: Cardiovascular Disease

## 2016-02-13 DIAGNOSIS — I739 Peripheral vascular disease, unspecified: Secondary | ICD-10-CM | POA: Diagnosis not present

## 2016-02-13 DIAGNOSIS — Z01818 Encounter for other preprocedural examination: Secondary | ICD-10-CM

## 2016-02-13 DIAGNOSIS — Z0000 Encounter for general adult medical examination without abnormal findings: Secondary | ICD-10-CM | POA: Diagnosis not present

## 2016-02-13 DIAGNOSIS — D689 Coagulation defect, unspecified: Secondary | ICD-10-CM

## 2016-02-13 DIAGNOSIS — Z79899 Other long term (current) drug therapy: Secondary | ICD-10-CM

## 2016-02-13 LAB — PROTIME-INR
INR: 1.1
Prothrombin Time: 11.5 s (ref 9.0–11.5)

## 2016-02-13 LAB — APTT: APTT: 26 s (ref 22–34)

## 2016-02-14 LAB — BASIC METABOLIC PANEL
BUN: 27 mg/dL — AB (ref 7–25)
CHLORIDE: 103 mmol/L (ref 98–110)
CO2: 27 mmol/L (ref 20–31)
Calcium: 9.5 mg/dL (ref 8.6–10.3)
Creat: 1.74 mg/dL — ABNORMAL HIGH (ref 0.70–1.18)
GLUCOSE: 215 mg/dL — AB (ref 65–99)
POTASSIUM: 4.2 mmol/L (ref 3.5–5.3)
Sodium: 137 mmol/L (ref 135–146)

## 2016-02-14 LAB — CBC WITH DIFFERENTIAL/PLATELET
BASOS PCT: 1 %
Basophils Absolute: 80 cells/uL (ref 0–200)
EOS ABS: 80 {cells}/uL (ref 15–500)
Eosinophils Relative: 1 %
HEMATOCRIT: 39.7 % (ref 38.5–50.0)
HEMOGLOBIN: 13.1 g/dL — AB (ref 13.2–17.1)
LYMPHS PCT: 28 %
Lymphs Abs: 2240 cells/uL (ref 850–3900)
MCH: 31.1 pg (ref 27.0–33.0)
MCHC: 33 g/dL (ref 32.0–36.0)
MCV: 94.3 fL (ref 80.0–100.0)
MONO ABS: 560 {cells}/uL (ref 200–950)
MPV: 9.6 fL (ref 7.5–12.5)
Monocytes Relative: 7 %
NEUTROS PCT: 63 %
Neutro Abs: 5040 cells/uL (ref 1500–7800)
Platelets: 204 10*3/uL (ref 140–400)
RBC: 4.21 MIL/uL (ref 4.20–5.80)
RDW: 13.4 % (ref 11.0–15.0)
WBC: 8 10*3/uL (ref 3.8–10.8)

## 2016-02-14 LAB — TSH: TSH: 1.57 mIU/L (ref 0.40–4.50)

## 2016-02-17 DIAGNOSIS — I129 Hypertensive chronic kidney disease with stage 1 through stage 4 chronic kidney disease, or unspecified chronic kidney disease: Secondary | ICD-10-CM | POA: Diagnosis not present

## 2016-02-17 DIAGNOSIS — E1122 Type 2 diabetes mellitus with diabetic chronic kidney disease: Secondary | ICD-10-CM | POA: Diagnosis not present

## 2016-02-17 DIAGNOSIS — N183 Chronic kidney disease, stage 3 (moderate): Secondary | ICD-10-CM | POA: Diagnosis not present

## 2016-02-17 DIAGNOSIS — N08 Glomerular disorders in diseases classified elsewhere: Secondary | ICD-10-CM | POA: Diagnosis not present

## 2016-02-18 ENCOUNTER — Telehealth: Payer: Self-pay | Admitting: *Deleted

## 2016-02-18 NOTE — Telephone Encounter (Signed)
Dr Gwenlyn Found aware that patient took his losartan/hctz this morning but will hold it tomorrow, 02/19/16 and 02/20/16. No new orders at this time.  [facility] aware that patient to be preadmitted.

## 2016-02-18 NOTE — Telephone Encounter (Signed)
-----   Message from Lorretta Harp, MD sent at 02/14/2016 12:05 PM EDT ----- Hold losartan and hydrochlorothiazide for 3 days prior to the procedure and bring him in the night before for hydration

## 2016-02-18 NOTE — Telephone Encounter (Signed)
Notes Recorded by Vanessa Ralphs, RN on 02/18/2016 at 9:20 AM EDT Called patient. He had already taken his Losartan/HCTZ today, 02/18/16. Explained to him the procedure that someone from [facility] will call him tomorrow with the time and place for him to go into the hospital tomorrow. He verbalized understanding not to take Losartan/HCTZ tomorrow or the morning of the procedure.

## 2016-02-19 ENCOUNTER — Observation Stay (HOSPITAL_COMMUNITY)
Admission: RE | Admit: 2016-02-19 | Discharge: 2016-02-20 | Disposition: A | Discharge status: Home / Self Care | Payer: Medicare Other | Source: Ambulatory Visit | Attending: Cardiovascular Disease | Admitting: Cardiovascular Disease

## 2016-02-19 DIAGNOSIS — F17210 Nicotine dependence, cigarettes, uncomplicated: Secondary | ICD-10-CM | POA: Insufficient documentation

## 2016-02-19 DIAGNOSIS — Z7982 Long term (current) use of aspirin: Secondary | ICD-10-CM | POA: Diagnosis not present

## 2016-02-19 DIAGNOSIS — I10 Essential (primary) hypertension: Secondary | ICD-10-CM | POA: Diagnosis not present

## 2016-02-19 DIAGNOSIS — E785 Hyperlipidemia, unspecified: Secondary | ICD-10-CM | POA: Diagnosis not present

## 2016-02-19 DIAGNOSIS — I739 Peripheral vascular disease, unspecified: Secondary | ICD-10-CM | POA: Diagnosis not present

## 2016-02-19 DIAGNOSIS — E119 Type 2 diabetes mellitus without complications: Secondary | ICD-10-CM | POA: Diagnosis not present

## 2016-02-19 DIAGNOSIS — I70211 Atherosclerosis of native arteries of extremities with intermittent claudication, right leg: Principal | ICD-10-CM | POA: Insufficient documentation

## 2016-02-19 DIAGNOSIS — E860 Dehydration: Secondary | ICD-10-CM | POA: Diagnosis not present

## 2016-02-19 DIAGNOSIS — I2510 Atherosclerotic heart disease of native coronary artery without angina pectoris: Secondary | ICD-10-CM | POA: Diagnosis not present

## 2016-02-19 DIAGNOSIS — Z794 Long term (current) use of insulin: Secondary | ICD-10-CM | POA: Insufficient documentation

## 2016-02-19 HISTORY — DX: Peripheral vascular disease, unspecified: I73.9

## 2016-02-19 LAB — GLUCOSE, CAPILLARY
GLUCOSE-CAPILLARY: 221 mg/dL — AB (ref 65–99)
GLUCOSE-CAPILLARY: 256 mg/dL — AB (ref 65–99)

## 2016-02-19 LAB — COMPREHENSIVE METABOLIC PANEL
ALT: 22 U/L (ref 17–63)
ANION GAP: 11 (ref 5–15)
AST: 24 U/L (ref 15–41)
Albumin: 3.4 g/dL — ABNORMAL LOW (ref 3.5–5.0)
Alkaline Phosphatase: 28 U/L — ABNORMAL LOW (ref 38–126)
BUN: 26 mg/dL — AB (ref 6–20)
CHLORIDE: 102 mmol/L (ref 101–111)
CO2: 23 mmol/L (ref 22–32)
Calcium: 9.7 mg/dL (ref 8.9–10.3)
Creatinine, Ser: 1.72 mg/dL — ABNORMAL HIGH (ref 0.61–1.24)
GFR calc Af Amer: 43 mL/min — ABNORMAL LOW (ref >60)
GFR, EST NON AFRICAN AMERICAN: 37 mL/min — AB (ref >60)
Glucose, Bld: 220 mg/dL — ABNORMAL HIGH (ref 65–99)
POTASSIUM: 3.9 mmol/L (ref 3.5–5.1)
Sodium: 136 mmol/L (ref 135–145)
Total Bilirubin: 0.6 mg/dL (ref 0.3–1.2)
Total Protein: 6.6 g/dL (ref 6.5–8.1)

## 2016-02-19 LAB — CBC
HCT: 38.3 % — ABNORMAL LOW (ref 39.0–52.0)
Hemoglobin: 13.2 g/dL (ref 13.0–17.0)
MCH: 31.8 pg (ref 26.0–34.0)
MCHC: 34.5 g/dL (ref 30.0–36.0)
MCV: 92.3 fL (ref 78.0–100.0)
PLATELETS: 205 10*3/uL (ref 150–400)
RBC: 4.15 MIL/uL — ABNORMAL LOW (ref 4.22–5.81)
RDW: 13 % (ref 11.5–15.5)
WBC: 6.8 10*3/uL (ref 4.0–10.5)

## 2016-02-19 LAB — PROTIME-INR
INR: 0.97
PROTHROMBIN TIME: 12.8 s (ref 11.4–15.2)

## 2016-02-19 MED ORDER — DOXAZOSIN MESYLATE 1 MG PO TABS
1.0000 mg | ORAL_TABLET | Freq: Every day | ORAL | Status: DC
  Administered 2016-02-20: 1 mg via ORAL
  Filled 2016-02-19: qty 1

## 2016-02-19 MED ORDER — ASPIRIN EC 81 MG PO TBEC
81.0000 mg | DELAYED_RELEASE_TABLET | Freq: Every day | ORAL | Status: DC
  Administered 2016-02-20: 81 mg via ORAL
  Filled 2016-02-19: qty 1

## 2016-02-19 MED ORDER — INSULIN DETEMIR 100 UNIT/ML ~~LOC~~ SOLN
30.0000 [IU] | Freq: Two times a day (BID) | SUBCUTANEOUS | Status: DC
  Administered 2016-02-19: 15 [IU] via SUBCUTANEOUS
  Administered 2016-02-20: 30 [IU] via SUBCUTANEOUS
  Filled 2016-02-19 (×3): qty 0.3

## 2016-02-19 MED ORDER — ACETAMINOPHEN 325 MG PO TABS: 650.0000 mg | ORAL_TABLET | ORAL | Status: DC | PRN

## 2016-02-19 MED ORDER — SODIUM CHLORIDE 0.9 % WEIGHT BASED INFUSION: 1.0000 mL/kg/h | INTRAVENOUS | Status: DC

## 2016-02-19 MED ORDER — HEPARIN SODIUM (PORCINE) 5000 UNIT/ML IJ SOLN
5000.0000 [IU] | Freq: Three times a day (TID) | INTRAMUSCULAR | Status: DC
  Administered 2016-02-19 – 2016-02-20 (×3): 5000 [IU] via SUBCUTANEOUS
  Filled 2016-02-19 (×3): qty 1

## 2016-02-19 MED ORDER — SODIUM CHLORIDE 0.9 % WEIGHT BASED INFUSION: 3.0000 mL/kg/h | INTRAVENOUS | Status: DC

## 2016-02-19 MED ORDER — ONDANSETRON HCL 4 MG/2ML IJ SOLN: 4.0000 mg | Freq: Four times a day (QID) | INTRAMUSCULAR | Status: DC | PRN

## 2016-02-19 MED ORDER — SODIUM CHLORIDE 0.9 % IV SOLN
INTRAVENOUS | Status: DC
  Administered 2016-02-19 – 2016-02-20 (×2): via INTRAVENOUS

## 2016-02-19 MED ORDER — OMEGA-3 FATTY ACIDS 1000 MG PO CAPS: 4.0000 g | ORAL_CAPSULE | Freq: Every day | ORAL | Status: DC

## 2016-02-19 MED ORDER — METOPROLOL TARTRATE 50 MG PO TABS
50.0000 mg | ORAL_TABLET | Freq: Two times a day (BID) | ORAL | Status: DC
  Administered 2016-02-19 – 2016-02-20 (×2): 50 mg via ORAL
  Filled 2016-02-19 (×2): qty 1

## 2016-02-19 MED ORDER — ATORVASTATIN CALCIUM 20 MG PO TABS
20.0000 mg | ORAL_TABLET | Freq: Every day | ORAL | Status: DC
  Administered 2016-02-19: 20 mg via ORAL
  Filled 2016-02-19 (×2): qty 1

## 2016-02-19 MED ORDER — OMEGA-3-ACID ETHYL ESTERS 1 G PO CAPS
2.0000 g | ORAL_CAPSULE | Freq: Two times a day (BID) | ORAL | Status: DC
  Administered 2016-02-20: 2 g via ORAL
  Filled 2016-02-19: qty 2

## 2016-02-19 MED ORDER — ASPIRIN 81 MG PO CHEW
81.0000 mg | CHEWABLE_TABLET | ORAL | Status: AC
  Administered 2016-02-20: 81 mg via ORAL
  Filled 2016-02-19: qty 1

## 2016-02-19 MED ORDER — SODIUM CHLORIDE 0.9% FLUSH: 3.0000 mL | INTRAVENOUS | Status: DC | PRN

## 2016-02-19 MED ORDER — FENOFIBRATE 160 MG PO TABS
160.0000 mg | ORAL_TABLET | Freq: Every day | ORAL | Status: DC
  Administered 2016-02-20: 160 mg via ORAL
  Filled 2016-02-19: qty 1

## 2016-02-19 NOTE — H&P (Signed)
     02/19/2016 Arthur [name]   10/24/1940  [id number]  Primary Physician Arthur [name]., MD Primary Cardiologist: Arthur Harp MD Arthur [name]  HPI:  The patient is a 75 year old, mildly overweight, married Caucasian male, father of 2, grandfather to 33 grandchildren who I saw 12/24/15. He has a history of CAD status post circumflex intervention by myself May 20, 1996. He also had a 60% ostial ramus branch stenosis, a 70% distal RCA stenosis and normal LV function. His last functional study performed August 15, 2010, was nonischemic. He does continue to smoke 3 cigarettes a day. His other problems include hypertension, hyperlipidemia and noninsulin-requiring diabetes. Arthur [name] follows his lipid profile. Arthur [name] obtained lower extremity arterial Doppler studies on 10/15/14 which revealed ABIs showing a right ABI of 1.86. There was a high-frequency signal in his distal left SFA. He now complains of right lower extremity claudication. He denies chest pain or shortness of breath. Dopplers performed 01/28/16 revealed a decline in his right ABI from one down to 0.56 with a new high-frequency signal in his right common femoral artery.    No current facility-administered medications for this encounter.     No Known Allergies  Social History   Social History  . Marital status: Married    Spouse name: N/A  . Number of children: N/A  . Years of education: N/A   Occupational History  . Not on file.   Social History Main Topics  . Smoking status: Current Every Day Smoker    Packs/day: 0.50    Types: Cigarettes  . Smokeless tobacco: Not on file     Comment: 3 cigs a day  . Alcohol use No  . Drug use: No  . Sexual activity: Not on file   Other Topics Concern  . Not on file   Social History Narrative   Retired from [employer]. Married 2 children, 11 grand children, 6 great grand children     Review of Systems: General: negative for chills, fever, night sweats or  weight changes.  Cardiovascular: negative for chest pain, dyspnea on exertion, edema, orthopnea, palpitations, paroxysmal nocturnal dyspnea or shortness of breath Dermatological: negative for rash Respiratory: negative for cough or wheezing Urologic: negative for hematuria Abdominal: negative for nausea, vomiting, diarrhea, bright red blood per rectum, melena, or hematemesis Neurologic: negative for visual changes, syncope, or dizziness All other systems reviewed and are otherwise negative except as noted above.    Blood pressure 130/66, pulse 64, height '5\' 10"'$  (1.778 m), weight 173 lb 3.2 oz (78.563 kg), SpO2 96 %.  General appearance: alert and no distress Neck: no adenopathy, no carotid bruit, no JVD, supple, symmetrical, trachea midline and thyroid not enlarged, symmetric, no tenderness/mass/nodules Lungs: clear to auscultation bilaterally Heart: regular rate and rhythm, S1, S2 normal, no murmur, click, rub or gallop Extremities: extremities normal, atraumatic, no cyanosis or edema EKG not performed today  ASSESSMENT AND PLAN:   Claudication [name] has been complaining of right lower extreme claudication for last 6 months. Recent Dopplers performed 01/28/16 revealed a right ABI 0.56 which is reduced from 1.0 on a Doppler performed 3/16. He does have a high-frequency signal in his right common femoral artery. Based on this Arthur [name] recommended that we proceed with angiography and potential intervention. He will be admitted overnight for hydration with plans for PV procedure tomorrow.   Signed,  Arthur Bellis NP-C 02/19/2016 2:36

## 2016-02-20 ENCOUNTER — Encounter (HOSPITAL_COMMUNITY)
Admission: RE | Disposition: A | Discharge status: Home / Self Care | Payer: Self-pay | Source: Ambulatory Visit | Attending: Cardiovascular Disease

## 2016-02-20 ENCOUNTER — Encounter (HOSPITAL_COMMUNITY): Payer: Self-pay | Admitting: General Practice

## 2016-02-20 ENCOUNTER — Other Ambulatory Visit: Payer: Self-pay | Admitting: Student

## 2016-02-20 DIAGNOSIS — I10 Essential (primary) hypertension: Secondary | ICD-10-CM | POA: Diagnosis not present

## 2016-02-20 DIAGNOSIS — E785 Hyperlipidemia, unspecified: Secondary | ICD-10-CM | POA: Diagnosis not present

## 2016-02-20 DIAGNOSIS — Z7982 Long term (current) use of aspirin: Secondary | ICD-10-CM | POA: Diagnosis not present

## 2016-02-20 DIAGNOSIS — E119 Type 2 diabetes mellitus without complications: Secondary | ICD-10-CM | POA: Diagnosis not present

## 2016-02-20 DIAGNOSIS — Z794 Long term (current) use of insulin: Secondary | ICD-10-CM | POA: Diagnosis not present

## 2016-02-20 DIAGNOSIS — N183 Chronic kidney disease, stage 3 unspecified: Secondary | ICD-10-CM

## 2016-02-20 DIAGNOSIS — I70213 Atherosclerosis of native arteries of extremities with intermittent claudication, bilateral legs: Secondary | ICD-10-CM | POA: Diagnosis not present

## 2016-02-20 DIAGNOSIS — E860 Dehydration: Secondary | ICD-10-CM | POA: Diagnosis not present

## 2016-02-20 DIAGNOSIS — I70211 Atherosclerosis of native arteries of extremities with intermittent claudication, right leg: Secondary | ICD-10-CM | POA: Diagnosis not present

## 2016-02-20 DIAGNOSIS — I2510 Atherosclerotic heart disease of native coronary artery without angina pectoris: Secondary | ICD-10-CM | POA: Diagnosis not present

## 2016-02-20 HISTORY — PX: PERIPHERAL VASCULAR CATHETERIZATION: SHX172C

## 2016-02-20 LAB — BASIC METABOLIC PANEL
ANION GAP: 7 (ref 5–15)
BUN: 25 mg/dL — ABNORMAL HIGH (ref 6–20)
CHLORIDE: 101 mmol/L (ref 101–111)
CO2: 28 mmol/L (ref 22–32)
Calcium: 9.2 mg/dL (ref 8.9–10.3)
Creatinine, Ser: 1.48 mg/dL — ABNORMAL HIGH (ref 0.61–1.24)
GFR calc non Af Amer: 44 mL/min — ABNORMAL LOW (ref >60)
GFR, EST AFRICAN AMERICAN: 51 mL/min — AB (ref >60)
Glucose, Bld: 198 mg/dL — ABNORMAL HIGH (ref 65–99)
Potassium: 3.5 mmol/L (ref 3.5–5.1)
Sodium: 136 mmol/L (ref 135–145)

## 2016-02-20 LAB — GLUCOSE, CAPILLARY
Glucose-Capillary: 203 mg/dL — ABNORMAL HIGH (ref 65–99)
Glucose-Capillary: 154 mg/dL — ABNORMAL HIGH (ref 65–99)

## 2016-02-20 SURGERY — LOWER EXTREMITY ANGIOGRAPHY
Anesthesia: LOCAL

## 2016-02-20 MED ORDER — LIDOCAINE HCL (PF) 1 % IJ SOLN
INTRAMUSCULAR | Status: AC
  Filled 2016-02-20: qty 30

## 2016-02-20 MED ORDER — IODIXANOL 320 MG/ML IV SOLN
INTRAVENOUS | Status: DC | PRN
  Administered 2016-02-20: 75 mL via INTRA_ARTERIAL

## 2016-02-20 MED ORDER — ASPIRIN EC 325 MG PO TBEC: 325.0000 mg | DELAYED_RELEASE_TABLET | Freq: Every day | ORAL | Status: DC

## 2016-02-20 MED ORDER — LIDOCAINE HCL (PF) 1 % IJ SOLN
INTRAMUSCULAR | Status: DC | PRN
  Administered 2016-02-20: 25 mL

## 2016-02-20 MED ORDER — ONDANSETRON HCL 4 MG/2ML IJ SOLN: 4.0000 mg | Freq: Four times a day (QID) | INTRAMUSCULAR | Status: DC | PRN

## 2016-02-20 MED ORDER — HEPARIN (PORCINE) IN NACL 2-0.9 UNIT/ML-% IJ SOLN
INTRAMUSCULAR | Status: DC | PRN
  Administered 2016-02-20: 1000 mL
  Administered 2016-02-20: 500 mL

## 2016-02-20 MED ORDER — ACETAMINOPHEN 325 MG PO TABS: 650.0000 mg | ORAL_TABLET | ORAL | Status: DC | PRN

## 2016-02-20 MED ORDER — SODIUM CHLORIDE 0.9 % IV SOLN: INTRAVENOUS | Status: AC

## 2016-02-20 MED ORDER — HEPARIN (PORCINE) IN NACL 2-0.9 UNIT/ML-% IJ SOLN
INTRAMUSCULAR | Status: AC
  Filled 2016-02-20: qty 1000

## 2016-02-20 MED ORDER — HYDRALAZINE HCL 20 MG/ML IJ SOLN: 10.0000 mg | INTRAMUSCULAR | Status: DC | PRN

## 2016-02-20 SURGICAL SUPPLY — 11 items
CATH ANGIO 5F PIGTAIL 65CM (CATHETERS) ×2 IMPLANT
CATH CROSS OVER TEMPO 5F (CATHETERS) ×2 IMPLANT
CATH STRAIGHT 5FR 65CM (CATHETERS) ×2 IMPLANT
KIT PV (KITS) ×2 IMPLANT
SHEATH PINNACLE 5F 10CM (SHEATH) ×2 IMPLANT
STOPCOCK MORSE 400PSI 3WAY (MISCELLANEOUS) ×2 IMPLANT
SYRINGE MEDRAD AVANTA MACH 7 (SYRINGE) ×2 IMPLANT
TRANSDUCER W/STOPCOCK (MISCELLANEOUS) ×2 IMPLANT
TRAY PV CATH (CUSTOM PROCEDURE TRAY) ×2 IMPLANT
TUBING CIL FLEX 10 FLL-RA (TUBING) ×2 IMPLANT
WIRE HITORQ VERSACORE ST 145CM (WIRE) ×2 IMPLANT

## 2016-02-20 NOTE — Discharge Summary (Signed)
   Discharge Summary    Patient ID: Arthur [name],  MRN: [id number], DOB/AGE: 77/04/1941 77 y.o.  Admit date: 02/19/2016 Discharge date: 02/20/2016  Primary Care Provider: SHELTON,KIMBERLY R. Primary Cardiologist: Dr. Berry   Discharge Diagnoses    Active Problems:   PAD (peripheral artery disease) (HCC)   Allergies Allergies  Allergen Reactions  . Codeine Swelling    Diagnostic Studies/Procedures    02/20/2016  Angiographic Data:   1: Abdominal aortogram-the distal abdominal aorta was free of significant atherosclerotic changes 2: Left lower extremity-the left common iliac artery is widely patent. There was a 30-40% eccentric plaque in the distal left external iliac artery. 3: Right lower extremity-there was a 95-90% calcified, exophytic plaque in the distal right common femoral artery. There was a 75% calcified lesion in the mid right SFA with two-vessel runoff. The anterior tibial was occluded.  IMPRESSION: Arthur [name] has a high-grade calcified exophytic plaque in the distal right common femoral artery. The best treatment option would be endarterectomy with patch angioplasty. The sheath was removed and pressure held. Patient dehydrated over the next 4 hours and discharged home. We'll check a basic metabolic panel early next week. He will see me back in the office in 2-3 weeks at which time I will refer him to vascular surgery for evaluation and surgical revascularization. _____________   History of Present Illness     Arthur [name] is a 75-year-old male patient of Dr. Berry's with past apical history of hypertension, hyperlipidemia, CAD status post intervention to the left circumflex, along with 60% ostial ramus branch stenosis and 70% distal RCA stenosis. He is currently a smoker reporting he smokes at least 3 cigarettes a day. Dr. Mary obtained a lower extremity arterial Doppler on 3/20 03/09/2015 which revealed ABIs showing a right ABI of 1.86. There was also a high-frequency  signal in his left distal SFA. He presented back to the office reporting right lower extremity claudication, and repeat Dopplers were performed on 01/28/2016 showing a decline in his right ABI down to 0.56 with new high-frequency signal in the right common femoral artery. Based on and information Dr. Berry recommended proceeding forward with angiography and possible intervention. He was admitted on 02/19/2016 for IV hydration with plans for PV procedure the following day.  Hospital Course     Consultants: None   On admission his creatinine was 1.7 to, other electrolytes were stable, along with stable hemoglobin. He was hydrated with 100cc/hr of normal saline on admission into the following morning prior to his procedure. Morning creatinine was 1.48. He underwent lower extremity angiography with Dr. Barry which showed left common iliac artery is widely patent, 30-40% plaque in the distal left external iliac artery, 9 90-95% plaque in the right distal common femoral artery and 70% lesion in the mid right SFA with two-vessel runoff. The anterior tibial was occluded. His recommendation at that time was in arterectomy with patch angioplasty. The patient was taken back to the floor and hydrated over the next 4 hours with plans to discharge home. He will need a be met early next week, and will arrange follow-up in the office within 2-3 weeks with Dr. Berry to refer him for vascular surgery evaluation.  Physical Exam: General appearance: alert and no distress Neck: no adenopathy, no carotid bruit, no JVD, supple, symmetrical, trachea midline and thyroid not enlarged, symmetric, no tenderness/mass/nodules Lungs: clear to auscultation bilaterally Heart: regular rate and rhythm, S1, S2 normal, no murmur, click, rub or gallop Extremities: extremities normal,   atraumatic, no cyanosis or edema, Femoral cath site stable.  _____________  Discharge Vitals Blood pressure 139/64, pulse 87, temperature 98.3 F (36.8 C),  temperature source Oral, resp. rate 20, height 5' 10" (1.778 m), weight 170 lb 9.6 oz (77.4 kg), SpO2 98 %.  Filed Weights   02/19/16 1429 02/20/16 0642  Weight: 169 lb 8 oz (76.9 kg) 170 lb 9.6 oz (77.4 kg)    Labs & Radiologic Studies    CBC  Recent Labs  02/19/16 1516  WBC 6.8  HGB 13.2  HCT 38.3*  MCV 92.3  PLT 314   Basic Metabolic Panel  Recent Labs  02/19/16 1516 02/20/16 0358  NA 136 136  K 3.9 3.5  CL 102 101  CO2 23 28  GLUCOSE 220* 198*  BUN 26* 25*  CREATININE 1.72* 1.48*  CALCIUM 9.7 9.2   Liver Function Tests  Recent Labs  02/19/16 1516  AST 24  ALT 22  ALKPHOS 28*  BILITOT 0.6  PROT 6.6  ALBUMIN 3.4*   No results for input(s): LIPASE, AMYLASE in the last 72 hours. Cardiac Enzymes No results for input(s): CKTOTAL, CKMB, CKMBINDEX, TROPONINI in the last 72 hours. BNP Invalid input(s): POCBNP D-Dimer No results for input(s): DDIMER in the last 72 hours. Hemoglobin A1C No results for input(s): HGBA1C in the last 72 hours. Fasting Lipid Panel No results for input(s): CHOL, HDL, LDLCALC, TRIG, CHOLHDL, LDLDIRECT in the last 72 hours. Thyroid Function Tests No results for input(s): TSH, T4TOTAL, T3FREE, THYROIDAB in the last 72 hours.  Invalid input(s): FREET3 _____________    Disposition   Pt is being discharged home today in good condition.  Follow-up Plans & Appointments    Follow-up Information    Quay Burow, MD Follow up on 03/11/2016.   Specialties:  Cardiology, Radiology Why:  Cardiology Hospital Follow-Up 03/11/2016 at 10:00AM.  Contact information: [address] [place] [address] [place] .   Specialty:  Cardiology Why:  Lab Work on 02/27/2016 to check kidney function. [facility] in [place] [employer] [place]. Closed between 12:00-1:00PM. Contact information: [address] [place]., MD .   Specialty:  Internal Medicine Why:  Please follow up with your primary doctor regarding your elevated blood sugars within 2 weeks.  Contact information: [address] [place] [address] [phone]          Discharge Instructions    Diet - low sodium heart healthy    Complete by:  As directed   Discharge instructions    Complete by:  As directed   Groin Site Care Refer to this sheet in the next few weeks. These instructions provide you with information on caring for yourself after your procedure. Your caregiver may also give you more specific instructions. Your treatment has been planned according to current medical practices, but problems sometimes occur. Call your caregiver if you have any problems or questions after your procedure. HOME CARE INSTRUCTIONS You may shower 24 hours after the procedure. Remove the bandage (dressing) and gently wash the site with plain soap and water. Gently pat the site dry.  Do not apply powder or lotion to the site.  Do not sit in a bathtub, swimming pool, or whirlpool for 5 to 7 days.  No bending, squatting, or lifting anything over 10 pounds (4.5 kg) as directed by your caregiver.  Inspect the site at least twice daily.  Do not drive home if you are discharged the same day of the procedure. Have someone else drive you.  You may drive 24 hours after the procedure unless otherwise instructed by your caregiver.  What to expect: Any bruising will usually fade within 1 to 2 weeks.  Blood that collects in the tissue (hematoma) may be painful to the touch. It should usually decrease in size and tenderness within 1 to 2 weeks.  SEEK IMMEDIATE MEDICAL CARE IF: You have unusual pain at the groin site or down the affected leg.  You have redness, warmth, swelling, or pain at the groin site.  You have drainage (other than a small amount of blood on the dressing).  You have chills.  You have a fever or persistent  symptoms for more than 72 hours.  You have a fever and your symptoms suddenly get worse.  Your leg becomes pale, cool, tingly, or numb.  You have heavy bleeding from the site. Hold pressure on the site. .   Increase activity slowly    Complete by:  As directed      Discharge Medications   Current Discharge Medication List    CONTINUE these medications which have NOT CHANGED   Details  aspirin 325 MG EC tablet Take 325 mg by mouth daily.    atorvastatin (LIPITOR) 20 MG tablet TAKE 1 TABLET (20 MG TOTAL) BY MOUTH DAILY. Qty: 90 tablet, Refills: 2    cholecalciferol (VITAMIN D) 1000 UNITS tablet Take 1,000 Units by mouth daily.    doxazosin (CARDURA) 1 MG tablet TAKE 2 TABLETS AT BEDTIME FOR BLOOD PRESSURE AND URINE FLOW Qty: 180 tablet, Refills: 3    fenofibrate 160 MG tablet Take 1 tablet (160 mg total) by mouth daily. Qty: 30 tablet, Refills: 5    fish oil-omega-3 fatty acids 1000 MG capsule Take 4 g by mouth daily.    insulin detemir (LEVEMIR) 100 UNIT/ML injection Inject 30 Units into the skin 2 (two) times daily.     metoprolol (LOPRESSOR) 50 MG tablet Take 1 tablet (50 mg total) by mouth 2 (two) times daily. Qty: 180 tablet, Refills: 3      STOP taking these medications     losartan-hydrochlorothiazide (HYZAAR) 100-25 MG tablet           Outstanding Labs/Studies   BMET  Duration of Discharge Encounter   Greater than 30 minutes including physician time.  Signed, Lindsay Roberts NP-C 02/20/2016, 3:19 PM  

## 2016-02-20 NOTE — Progress Notes (Signed)
Patient tolerating Clear liquid diet.

## 2016-02-20 NOTE — H&P (View-Only) (Signed)
02/07/2016 Arthur [name]   Sep 10, 1940  [id number]  Primary Physician Arthur [name]., MD Primary Cardiologist: Arthur Harp MD Arthur [name]  HPI:  The patient is a 75 year old, mildly overweight, married Caucasian male, father of 2, grandfather to 15 grandchildren who I saw 12/24/15. He has a history of CAD status post circumflex intervention by myself May 20, 1996. He also had a 60% ostial ramus branch stenosis, a 70% distal RCA stenosis and normal LV function. His last functional study performed August 15, 2010, was nonischemic. He does continue to smoke 3 cigarettes a day. His other problems include hypertension, hyperlipidemia and noninsulin-requiring diabetes. Dr. Karlton [name] follows his lipid profile. . I obtained lower extremity arterial Doppler studies on 10/15/14 which revealed ABIs showing a right ABI of 1.86. There was a high-frequency signal in his distal left SFA. He now complains of right lower extremity claudication. He denies chest pain or shortness of breath. Dopplers performed 01/28/16 revealed a decline in his right ABI from one down to 0.56 with a new high-frequency signal in his right common femoral artery.    Current Outpatient Prescriptions  Medication Sig Dispense Refill  . aspirin 325 MG EC tablet Take 325 mg by mouth daily.    Marland Kitchen atorvastatin (LIPITOR) 20 MG tablet TAKE 1 TABLET (20 MG TOTAL) BY MOUTH DAILY. 90 tablet 2  . cholecalciferol (VITAMIN D) 1000 UNITS tablet Take 1,000 Units by mouth daily.    Marland Kitchen doxazosin (CARDURA) 1 MG tablet TAKE 2 TABLETS AT BEDTIME FOR BLOOD PRESSURE AND URINE FLOW 180 tablet 3  . doxazosin (CARDURA) 1 MG tablet Take 1 mg by mouth daily. 3 tablets daily    . fenofibrate 160 MG tablet Take 1 tablet (160 mg total) by mouth daily. 30 tablet 5  . fish oil-omega-3 fatty acids 1000 MG capsule Take 4 g by mouth daily.    . insulin detemir (LEVEMIR) 100 UNIT/ML injection Inject 30 Units into the skin 2 (two) times daily.     Marland Kitchen  losartan-hydrochlorothiazide (HYZAAR) 100-25 MG tablet TAKE 1 TABLET BY MOUTH DAILY. <PLEASE MAKE APPOINTMENT FOR REFILLS> 15 tablet 0  . metoprolol (LOPRESSOR) 50 MG tablet TAKE 1 TABLET BY MOUTH TWICE A DAY 60 tablet 2  . metoprolol (LOPRESSOR) 50 MG tablet Take 1 tablet (50 mg total) by mouth 2 (two) times daily. 180 tablet 3  . ULTICARE MICRO PEN NEEDLES 32G X 4 MM MISC CHECK BLOOD SUGARS 3X DAILY  11   No current facility-administered medications for this visit.    No Known Allergies  Social History   Social History  . Marital Status: Married    Spouse Name: N/A  . Number of Children: N/A  . Years of Education: N/A   Occupational History  . Not on file.   Social History Main Topics  . Smoking status: Current Every Day Smoker -- 0.50 packs/day    Types: Cigarettes  . Smokeless tobacco: Not on file     Comment: 3 cigs a day  . Alcohol Use: No  . Drug Use: No  . Sexual Activity: Not on file   Other Topics Concern  . Not on file   Social History Narrative   Retired from [employer]. Married 2 children, 11 grand children, 6 great grand children     Review of Systems: General: negative for chills, fever, night sweats or weight changes.  Cardiovascular: negative for chest pain, dyspnea on exertion, edema, orthopnea, palpitations, paroxysmal nocturnal dyspnea or shortness of breath  Dermatological: negative for rash Respiratory: negative for cough or wheezing Urologic: negative for hematuria Abdominal: negative for nausea, vomiting, diarrhea, bright red blood per rectum, melena, or hematemesis Neurologic: negative for visual changes, syncope, or dizziness All other systems reviewed and are otherwise negative except as noted above.    Blood pressure 130/66, pulse 64, height '5\' 10"'$  (1.778 m), weight 173 lb 3.2 oz (78.563 kg), SpO2 96 %.  General appearance: alert and no distress Neck: no adenopathy, no carotid bruit, no JVD, supple, symmetrical, trachea midline and thyroid  not enlarged, symmetric, no tenderness/mass/nodules Lungs: clear to auscultation bilaterally Heart: regular rate and rhythm, S1, S2 normal, no murmur, click, rub or gallop Extremities: extremities normal, atraumatic, no cyanosis or edema  EKG not performed today  ASSESSMENT AND PLAN:   Claudication [name] has been complaining of right lower extreme claudication for last 6 months. Recent Dopplers performed 01/28/16 revealed a right ABI 0.56 which is reduced from 1.0 on a Doppler performed 3/16. He does have a high-frequency signal in his right common femoral artery. Based on this I recommended that we proceed with angiography and potential intervention.      Arthur Harp MD FACP,FACC,FAHA, [facility] 02/07/2016 10:52 AM

## 2016-02-20 NOTE — Interval H&P Note (Signed)
History and Physical Interval Note:  02/20/2016 9:45 AM  Arthur [name]  has presented today for surgery, with the diagnosis of pvd  The various methods of treatment have been discussed with the patient and family. After consideration of risks, benefits and other options for treatment, the patient has consented to  Procedure(s): Lower Extremity Angiography (N/A) as a surgical intervention .  The patient's history has been reviewed, patient examined, no change in status, stable for surgery.  I have reviewed the patient's chart and labs.  Questions were answered to the patient's satisfaction.     Quay Burow

## 2016-02-20 NOTE — Progress Notes (Signed)
Site area: LFA Site Prior to Removal:  Level 0 Pressure Applied For:20 min Manual:  yes  Patient Status During Pull:  stable Post Pull Site:  Level Post Pull Instructions Given:  yes Post Pull Pulses Present:palpable  Dressing Applied:  tegaderm Bedrest begins @ 1120 till 1520 Comments:

## 2016-02-20 NOTE — Progress Notes (Signed)
Patient discharge to home accompanied by spouse and NT via wheelchair. Discharge instructions given. Patient verbalizes understanding.All personalbelongingsgive,/ Telemetry box and IV removed prior to discharge. No drainage and no active bleeding noted to the left groin area.

## 2016-02-20 NOTE — Progress Notes (Signed)
Inpatient Diabetes Program Recommendations  AACE/ADA: New Consensus Statement on Inpatient Glycemic Control (2015)  Target Ranges:  Prepandial:   less than 140 mg/dL      Peak postprandial:   less than 180 mg/dL (1-2 hours)      Critically ill patients:  140 - 180 mg/dL   Results for Arthur [name], Arthur [name] (MRN [id number]) as of 02/20/2016 09:17  Ref. Range 02/19/2016 15:16 02/20/2016 03:58  Glucose Latest Ref Range: 65 - 99 mg/dL 220 (H) 198 (H)   Review of Glycemic Control  Diabetes history: DM2 Outpatient Diabetes medications: Levemir 30 units BID Current orders for Inpatient glycemic control: Levemir 30 units BID  Inpatient Diabetes Program Recommendations: Insulin - Basal: Patient received Levemir 15 units last night at bedtime and fasting glucose is 203 mg/dl this morning. Please consider changing Levemir to 15 units BID. Correction (SSI): Please consider ordering CBGs with Novolog 0-9 units Q4H (change to ACHS when diet resumed). HgbA1C: Please consider ordering an A1C to evaluate glycemic control over the past 2-3 months.  Thanks, Barnie Alderman, RN, MSN, CDE Diabetes Coordinator Inpatient Diabetes Program [phone] (Team Pager from [place] to [place]) [phone] ([place] office) [phone] [facility] office) [phone] [facility] office)

## 2016-02-21 ENCOUNTER — Encounter (HOSPITAL_COMMUNITY): Payer: Self-pay | Admitting: Cardiovascular Disease

## 2016-02-24 ENCOUNTER — Telehealth: Payer: Self-pay | Admitting: Cardiovascular Disease

## 2016-02-24 DIAGNOSIS — Z79899 Other long term (current) drug therapy: Secondary | ICD-10-CM

## 2016-02-24 DIAGNOSIS — N183 Chronic kidney disease, stage 3 unspecified: Secondary | ICD-10-CM

## 2016-02-24 NOTE — Telephone Encounter (Signed)
Attempted to return call to patient. His phone number did not ring and had a message that it had not been set up yet.

## 2016-02-24 NOTE — Telephone Encounter (Signed)
Arthur [name] is calling because when he was discharged from the hospital , some of his medications were dropped off. The vitamins in which he was taken have dropped of the list he would like to keep taking them but is not sure if he should or not . Please call him at [phone]

## 2016-02-26 NOTE — Telephone Encounter (Signed)
Returned pt call. He was recently discharged and had med changes on his list.   Patient wanted to see if it would be OK for him to continue his Centrum Silver and Osteo Bi-flex vitamins w current prescription meds. Informed pt should not be any problems, I would verify w pharmD and return his call. Pt voiced thanks.

## 2016-02-27 DIAGNOSIS — N183 Chronic kidney disease, stage 3 (moderate): Secondary | ICD-10-CM | POA: Diagnosis not present

## 2016-02-27 DIAGNOSIS — Z79899 Other long term (current) drug therapy: Secondary | ICD-10-CM | POA: Diagnosis not present

## 2016-02-27 LAB — BASIC METABOLIC PANEL
BUN: 24 mg/dL (ref 7–25)
CHLORIDE: 103 mmol/L (ref 98–110)
CO2: 29 mmol/L (ref 20–31)
Calcium: 10 mg/dL (ref 8.6–10.3)
Creat: 1.46 mg/dL — ABNORMAL HIGH (ref 0.70–1.18)
Glucose, Bld: 108 mg/dL — ABNORMAL HIGH (ref 65–99)
POTASSIUM: 4.9 mmol/L (ref 3.5–5.3)
SODIUM: 140 mmol/L (ref 135–146)

## 2016-02-27 NOTE — Telephone Encounter (Signed)
Received call from [facility] needing to know what labs to draw for patient - he presented there but did not have visible orders in system. Reviewed and reordered a BMP.  Also spoke w/ patient and discussed the recommendations from pharmD that it is ok to continue his vitamins.  Pt verbalized thanks and understanding.

## 2016-02-27 NOTE — Telephone Encounter (Signed)
These are fine with his current medications

## 2016-03-09 ENCOUNTER — Other Ambulatory Visit: Payer: Self-pay | Admitting: Cardiovascular Disease

## 2016-03-09 NOTE — Telephone Encounter (Signed)
Rx(s) sent to pharmacy electronically.  

## 2016-03-11 ENCOUNTER — Encounter: Payer: Self-pay | Admitting: Cardiovascular Disease

## 2016-03-11 ENCOUNTER — Ambulatory Visit (INDEPENDENT_AMBULATORY_CARE_PROVIDER_SITE_OTHER): Payer: Medicare Other | Admitting: Cardiovascular Disease

## 2016-03-11 VITALS — BP 142/64 | HR 57 | Ht 70.0 in | Wt 177.0 lb

## 2016-03-11 DIAGNOSIS — I739 Peripheral vascular disease, unspecified: Secondary | ICD-10-CM

## 2016-03-11 DIAGNOSIS — I10 Essential (primary) hypertension: Secondary | ICD-10-CM

## 2016-03-11 DIAGNOSIS — E785 Hyperlipidemia, unspecified: Secondary | ICD-10-CM

## 2016-03-11 NOTE — Assessment & Plan Note (Signed)
History of CAD status post circumflex intervention by myself 05/20/1996. His last functional study performed 08/15/10 was nonischemic. He denies chest pain or shortness of breath.

## 2016-03-11 NOTE — Assessment & Plan Note (Signed)
History of peripheral arterial disease status post recent angiogram which I performed 02/19/16 revealing a 99% calcified exophytic distal right common femoral artery plaque with 75% mid right SFA stenosis and two-vessel runoff. He is symptomatic. I believe the best revascularization option would be common femoral endarterectomy and patch angioplasty. I'm referring him to Dr. Trula Slade for surgical evaluation and treatment. I will follow him in my office after his surgery and will repeat his Doppler studies. He may ultimately require right SFA intervention endovascularly.Marland Kitchen

## 2016-03-11 NOTE — Progress Notes (Signed)
03/11/2016 Arthur [name]   April 01, 1941  [id number]  Primary Physician Salena Saner., MD Primary Cardiologist: Lorretta Harp MD [name]  HPI:  The patient is a 75 year old, mildly overweight, married Caucasian male, father of 2, grandfather to 61 grandchildren who I saw 02/07/16. He has a history of CAD status post circumflex intervention by myself May 20, 1996. He also had a 60% ostial ramus branch stenosis, a 70% distal RCA stenosis and normal LV function. His last functional study performed August 15, 2010, was nonischemic. He does continue to smoke 3 cigarettes a day. His other problems include hypertension, hyperlipidemia and noninsulin-requiring diabetes. Dr. Karlton Lemon follows his lipid profile. . I obtained lower extremity arterial Doppler studies on 10/15/14 which revealed ABIs showing a right ABI of 1.86. There was a high-frequency signal in his distal left SFA. He now complains of right lower extremity claudication. He denies chest pain or shortness of breath. Dopplers performed 01/28/16 revealed a decline in his right ABI from one down to 0.56 with a new high-frequency signal in his right common femoral artery. I performed angiography on him 02/20/16 revealing a 99% calcified exophytic distal right common femoral artery stenosis along with a 35% calcified mid right SFA stenosis and two-vessel runoff. He was sent home the same day. He presents today but for follow-up. I think the best revascularization strategy would be right common femoral endarterectomy with patch angioplasty.  Current Outpatient Prescriptions  Medication Sig Dispense Refill  . aspirin 325 MG EC tablet Take 325 mg by mouth daily.    Marland Kitchen atorvastatin (LIPITOR) 20 MG tablet TAKE 1 TABLET (20 MG TOTAL) BY MOUTH DAILY. 90 tablet 2  . cholecalciferol (VITAMIN D) 1000 UNITS tablet Take 1,000 Units by mouth daily.    Marland Kitchen doxazosin (CARDURA) 1 MG tablet TAKE 2 TABLETS AT BEDTIME FOR BLOOD PRESSURE AND URINE FLOW  180 tablet 3  . fenofibrate 160 MG tablet Take 1 tablet (160 mg total) by mouth daily. 30 tablet 5  . fish oil-omega-3 fatty acids 1000 MG capsule Take 4 g by mouth daily.    . insulin detemir (LEVEMIR) 100 UNIT/ML injection Inject 30 Units into the skin 2 (two) times daily.     . metoprolol (LOPRESSOR) 50 MG tablet Take 1 tablet (50 mg total) by mouth 2 (two) times daily. 180 tablet 3   No current facility-administered medications for this visit.     Allergies  Allergen Reactions  . Codeine Swelling    Social History   Social History  . Marital status: Married    Spouse name: N/A  . Number of children: N/A  . Years of education: N/A   Occupational History  . Not on file.   Social History Main Topics  . Smoking status: Current Every Day Smoker    Packs/day: 0.50    Years: 65.00    Types: Cigarettes  . Smokeless tobacco: Never Used     Comment: 3 cigs a day  . Alcohol use No  . Drug use: No  . Sexual activity: Not on file   Other Topics Concern  . Not on file   Social History Narrative   Retired from [employer]. Married 2 children, 11 grand children, 6 great grand children     Review of Systems: General: negative for chills, fever, night sweats or weight changes.  Cardiovascular: negative for chest pain, dyspnea on exertion, edema, orthopnea, palpitations, paroxysmal nocturnal dyspnea or shortness of breath Dermatological: negative for rash Respiratory: negative  for cough or wheezing Urologic: negative for hematuria Abdominal: negative for nausea, vomiting, diarrhea, bright red blood per rectum, melena, or hematemesis Neurologic: negative for visual changes, syncope, or dizziness All other systems reviewed and are otherwise negative except as noted above.    Blood pressure (!) 142/64, pulse (!) 57, height '5\' 10"'$  (1.778 m), weight 177 lb (80.3 kg).  General appearance: alert and no distress Neck: no adenopathy, no carotid bruit, no JVD, supple, symmetrical,  trachea midline and thyroid not enlarged, symmetric, no tenderness/mass/nodules Lungs: clear to auscultation bilaterally Heart: regular rate and rhythm, S1, S2 normal, no murmur, click, rub or gallop Extremities: extremities normal, atraumatic, no cyanosis or edema  EKG not performed today  ASSESSMENT AND PLAN:   Coronary artery disease History of CAD status post circumflex intervention by myself 05/20/1996. His last functional study performed 08/15/10 was nonischemic. He denies chest pain or shortness of breath.  Essential hypertension History of hypertension blood pressure measured at 142/64. He is on metoprolol. Continue current meds at current dosing. Interestingly, his losartan and hydrochlorothiazide were discontinued and he says that he feels better since that time  Hyperlipidemia History of hyperlipidemia on statin therapy with recent lipid profile performed 12/31/15 with a total cholesterol of 136, LDL 71 and HDL 33  PAD (peripheral artery disease) (HCC) History of peripheral arterial disease status post recent angiogram which I performed 02/19/16 revealing a 99% calcified exophytic distal right common femoral artery plaque with 75% mid right SFA stenosis and two-vessel runoff. He is symptomatic. I believe the best revascularization option would be common femoral endarterectomy and patch angioplasty. I'm referring him to Dr. Trula Slade for surgical evaluation and treatment. I will follow him in my office after his surgery and will repeat his Doppler studies. He may ultimately require right SFA intervention endovascularly.Lorretta Harp MD FACP,FACC,FAHA, [facility] 03/11/2016 10:53 AM

## 2016-03-11 NOTE — Patient Instructions (Signed)
Medication Instructions:  Your physician recommends that you continue on your current medications as directed. Please refer to the Current Medication list given to you today.   Follow-Up: You have been referred to WITH DR Trula Slade for consultation for vascular surgery.  Your physician recommends that you schedule a follow-up appointment in: [place].   If you need a refill on your cardiac medications before your next appointment, please call your pharmacy.

## 2016-03-11 NOTE — Assessment & Plan Note (Signed)
History of hyperlipidemia on statin therapy with recent lipid profile performed 12/31/15 with a total cholesterol of 136, LDL 71 and HDL 33

## 2016-03-11 NOTE — Assessment & Plan Note (Signed)
History of hypertension blood pressure measured at 142/64. He is on metoprolol. Continue current meds at current dosing. Interestingly, his losartan and hydrochlorothiazide were discontinued and he says that he feels better since that time

## 2016-04-04 DIAGNOSIS — J40 Bronchitis, not specified as acute or chronic: Secondary | ICD-10-CM | POA: Diagnosis not present

## 2016-04-04 DIAGNOSIS — R05 Cough: Secondary | ICD-10-CM | POA: Diagnosis not present

## 2016-04-04 DIAGNOSIS — R062 Wheezing: Secondary | ICD-10-CM | POA: Diagnosis not present

## 2016-04-14 DIAGNOSIS — I129 Hypertensive chronic kidney disease with stage 1 through stage 4 chronic kidney disease, or unspecified chronic kidney disease: Secondary | ICD-10-CM | POA: Diagnosis not present

## 2016-04-14 DIAGNOSIS — E1121 Type 2 diabetes mellitus with diabetic nephropathy: Secondary | ICD-10-CM | POA: Diagnosis not present

## 2016-04-14 DIAGNOSIS — N2581 Secondary hyperparathyroidism of renal origin: Secondary | ICD-10-CM | POA: Diagnosis not present

## 2016-04-14 DIAGNOSIS — N183 Chronic kidney disease, stage 3 (moderate): Secondary | ICD-10-CM | POA: Diagnosis not present

## 2016-04-14 DIAGNOSIS — D509 Iron deficiency anemia, unspecified: Secondary | ICD-10-CM | POA: Diagnosis not present

## 2016-04-15 ENCOUNTER — Encounter: Payer: Self-pay | Admitting: Surgery

## 2016-04-17 ENCOUNTER — Telehealth: Payer: Self-pay | Admitting: Cardiovascular Disease

## 2016-04-17 NOTE — Telephone Encounter (Signed)
Records received from [facility] for apt on 06/16/16 with Dr Gwenlyn Found. Records given to [employer] (medical records) CN

## 2016-04-20 ENCOUNTER — Ambulatory Visit (INDEPENDENT_AMBULATORY_CARE_PROVIDER_SITE_OTHER): Payer: Medicare Other | Admitting: Surgery

## 2016-04-20 ENCOUNTER — Encounter: Payer: Self-pay | Admitting: Surgery

## 2016-04-20 ENCOUNTER — Other Ambulatory Visit: Payer: Self-pay

## 2016-04-20 VITALS — BP 170/81 | HR 65 | Temp 97.9°F | Resp 16 | Ht 70.0 in | Wt 173.0 lb

## 2016-04-20 DIAGNOSIS — I70211 Atherosclerosis of native arteries of extremities with intermittent claudication, right leg: Secondary | ICD-10-CM

## 2016-04-20 NOTE — Progress Notes (Signed)
Vascular and Vein Specialist of [facility]  Patient name: Arthur [name] MRN: [id number] DOB: Dec 25, 1940 Sex: male  REFERRING PHYSICIAN: Dr. Gwenlyn Found  REASON FOR CONSULT: claudication  HPI: Arthur [name] is a 75 y.o. male, who is referred today for claudication.  The patient states that he has difficulty with his right leg when walking several 100 feet.  He underwent angiography which showed a focal calcific plaque at the right femoral bifurcation.  He does not have any ulcers.  The patient has a history of coronary artery disease, status post PCI he continues to smoke.  He suffers from hypercholesterolemia which is managed with a statin.  The patient is a diabetic, treated with insulin.  Past Medical History:  Diagnosis Date  . Claudication ([place])   . Coronary artery disease 1997   CFX PCI, '97. Mod residual RI and RCA dis.  . Diabetes mellitus ([place])    IDDM  . Dyslipidemia   . HTN (hypertension)   . PAD (peripheral artery disease) ([place])   . Tobacco abuse     Family History  Problem Relation Age of Onset  . Diabetes Sister     SOCIAL HISTORY: Social History   Social History  . Marital status: Married    Spouse name: N/A  . Number of children: N/A  . Years of education: N/A   Occupational History  . Not on file.   Social History Main Topics  . Smoking status: Current Every Day Smoker    Packs/day: 0.50    Years: 65.00    Types: Cigarettes  . Smokeless tobacco: Never Used     Comment: 3 cigs a day  . Alcohol use No  . Drug use: No  . Sexual activity: Not on file   Other Topics Concern  . Not on file   Social History Narrative   Retired from [employer]. Married 2 children, 11 grand children, 6 great grand children    Allergies  Allergen Reactions  . Codeine Swelling    Current Outpatient Prescriptions  Medication Sig Dispense Refill  . aspirin 325 MG EC tablet Take 325 mg by mouth daily.    Marland Kitchen atorvastatin (LIPITOR) 20 MG tablet  TAKE 1 TABLET (20 MG TOTAL) BY MOUTH DAILY. 90 tablet 2  . cholecalciferol (VITAMIN D) 1000 UNITS tablet Take 1,000 Units by mouth daily.    Marland Kitchen doxazosin (CARDURA) 1 MG tablet TAKE 2 TABLETS AT BEDTIME FOR BLOOD PRESSURE AND URINE FLOW 180 tablet 3  . fenofibrate 160 MG tablet Take 1 tablet (160 mg total) by mouth daily. 30 tablet 5  . fish oil-omega-3 fatty acids 1000 MG capsule Take 4 g by mouth daily.    . insulin detemir (LEVEMIR) 100 UNIT/ML injection Inject 30 Units into the skin 2 (two) times daily.     . metoprolol (LOPRESSOR) 50 MG tablet Take 1 tablet (50 mg total) by mouth 2 (two) times daily. 180 tablet 3   No current facility-administered medications for this visit.     REVIEW OF SYSTEMS:  '[X]'$  denotes positive finding, '[ ]'$  denotes negative finding Cardiac  Comments:  Chest pain or chest pressure: x   Shortness of breath upon exertion:    Short of breath when lying flat:    Irregular heart rhythm:        Vascular    Pain in calf, thigh, or hip brought on by ambulation: x   Pain in feet at night that wakes you up from your sleep:     Blood  clot in your veins: x   Leg swelling:         Pulmonary    Oxygen at home:    Productive cough:     Wheezing:         Neurologic    Sudden weakness in arms or legs:     Sudden numbness in arms or legs:     Sudden onset of difficulty speaking or slurred speech:    Temporary loss of vision in one eye:     Problems with dizziness:  x       Gastrointestinal    Blood in stool:     Vomited blood:         Genitourinary    Burning when urinating:     Blood in urine:        Psychiatric    Major depression:         Hematologic    Bleeding problems:    Problems with blood clotting too easily:        Skin    Rashes or ulcers:        Constitutional    Fever or chills:      PHYSICAL EXAM: Vitals:   04/20/16 0906 04/20/16 0909  BP: (!) 157/84 (!) 170/81  Pulse: 65   Resp: 16   Temp: 97.9 F (36.6 C)   TempSrc: Oral     SpO2: 97%   Weight: 173 lb (78.5 kg)   Height: '5\' 10"'$  (1.778 m)     GENERAL: The patient is a well-nourished male, in no acute distress. The vital signs are documented above. CARDIAC: There is a regular rate and rhythm.  VASCULAR: No carotid bruits PULMONARY: There is good air exchange bilaterally without wheezing or rales. MUSCULOSKELETAL: There are no major deformities or cyanosis. NEUROLOGIC: No focal weakness or paresthesias are detected. SKIN: There are no ulcers or rashes noted. PSYCHIATRIC: The patient has a normal affect.  DATA:  I have reviewed his arteriogram which shows a exophytic calcified plaque at the femoral bifurcation  ASSESSMENT AND PLAN: Right leg claudication: I discussed proceeding with right femoral endarterectomy with bovine pericardial patch angioplasty.  We discussed risks and benefits of the operation which include but are not limited to the risk of leg swelling, infection, bleeding, recurrent stenosis.  All of his questions were answered and he wishes to proceed.  His operations been scheduled for Wednesday, October 25   Annamarie Major, MD Vascular and Vein Specialists of [facility] [phone] Pager [phone]

## 2016-05-01 DIAGNOSIS — E1122 Type 2 diabetes mellitus with diabetic chronic kidney disease: Secondary | ICD-10-CM | POA: Diagnosis not present

## 2016-05-01 DIAGNOSIS — N183 Chronic kidney disease, stage 3 (moderate): Secondary | ICD-10-CM | POA: Diagnosis not present

## 2016-05-01 DIAGNOSIS — I129 Hypertensive chronic kidney disease with stage 1 through stage 4 chronic kidney disease, or unspecified chronic kidney disease: Secondary | ICD-10-CM | POA: Diagnosis not present

## 2016-05-01 DIAGNOSIS — N08 Glomerular disorders in diseases classified elsewhere: Secondary | ICD-10-CM | POA: Diagnosis not present

## 2016-05-06 ENCOUNTER — Other Ambulatory Visit (HOSPITAL_COMMUNITY): Payer: Self-pay | Admitting: *Deleted

## 2016-05-06 NOTE — Pre-Procedure Instructions (Addendum)
Arthur [name]  05/06/2016      [id number] [place] [place] - [address] [address]Phone: [phone]: [phone]  Your procedure is scheduled on Wednesday, May 13, 2016 at 8:30 AM.   [facility] 6:30 AM.   Call this number if you have problems the morning of [phone]   Questions prior to day of surgery, please call [phone] 8 & 4 PM.   Remember:  Do not eat food or drink liquids after midnight Tuesday, 05/12/16.   Take these medicines the morning of surgery with A SIP OF WATER: Aspirin, Metoprolol (Lopressor)  Stop Fish Oil, Multivitamins and Herbal medications as of today.   What do I do about my diabetes medications?   THE NIGHT BEFORE SURGERY, take 15 units of Levemir Insulin.   THE MORNING OF SURGERY, take 15 units of Levemir Insulin.    How to Manage Your Diabetes Before Surgery   Why is it important to control my blood sugar before and after surgery?   Improving blood sugar levels before and after surgery helps healing and can limit problems.  A way of improving blood sugar control is eating a healthy diet by:  - Eating less sugar and carbohydrates  - Increasing activity/exercise  - Talk with your doctor about reaching your blood sugar goals  High blood sugars (greater than 180 mg/dL) can raise your risk of infections and slow down your recovery so you will need to focus on controlling your diabetes during the weeks before surgery.  Make sure that the doctor who takes care of your diabetes knows about your planned surgery including the date and location.  How do I manage my blood sugars before surgery?   Check your blood sugar at least 4 times a day, 2 days before surgery to make sure that they are not too high or low.  Check your blood sugar the morning of your surgery when you wake up and every 2 hours until you get  to the Short-Stay unit.  Treat a low blood sugar (less than 70 mg/dL) with 1/2 cup of clear juice (cranberry or apple), 4 glucose tablets, OR glucose gel.  Recheck blood sugar in 15 minutes after treatment (to make sure it is greater than 70 mg/dL).  If blood sugar is not greater than 70 mg/dL on re-check, call [phone] further instructions.   Report your blood sugar to the Short-Stay nurse when you get to Short-Stay.  References:  [facility] 2007 "How to Manage your Diabetes Before and After Surgery".    Do not wear jewelry.  Do not wear lotions, powders, cologne, or deodorant.  Men may shave face and neck.  Do not bring valuables to the hospital.  [facility] or [facility].  Contacts, dentures or bridgework may not be worn into surgery.  Leave your suitcase in the car.  After surgery it may be brought to your room.  For patients admitted to the hospital, discharge time will be determined by your treatment team.   Special instructions:  [facility] - Preparing for Surgery  Before surgery, you can play an important role.  Because skin is not sterile, your skin needs to be as free of germs as possible.  You can reduce the number of germs on you skin by washing with CHG (chlorahexidine gluconate) soap before surgery.  CHG is an antiseptic cleaner which kills germs and bonds with the skin to continue killing germs even after washing.  Please DO NOT use if you have an allergy to CHG or antibacterial soaps.  If your skin becomes reddened/irritated stop using the CHG and inform your nurse when you arrive at Short Stay.  Do not shave (including legs and underarms) for at least 48 hours prior to the first CHG shower.  You may shave your face.  Please follow these instructions carefully:   1.  Shower with CHG Soap the night before surgery and the  morning of Surgery.  2.  If you choose to wash your hair, wash your hair  first as usual with your normal shampoo.  3.  After you shampoo, rinse your hair and body thoroughly to remove the shampoo.  4.  Use CHG as you would any other liquid soap.  You can apply chg directly to the skin and wash gently with scrungie or a clean washcloth.  5.  Apply the CHG Soap to your body ONLY FROM THE NECK DOWN.  Do not use on open wounds or open sores.  Avoid contact with your eyes, ears, mouth and genitals (private parts).  Wash genitals (private parts) with your normal soap.  6.  Wash thoroughly, paying special attention to the area where your surgery will be performed.  7.  Thoroughly rinse your body with warm water from the neck down.  8.  DO NOT shower/wash with your normal soap after using and rinsing off the CHG Soap.  9.  Pat yourself dry with a clean towel.            10.  Wear clean pajamas.            11.  Place clean sheets on your bed the night of your first shower and do not sleep with pets.  Day of Surgery  Do not apply any lotions/deodorants the morning of surgery.  Please wear clean clothes to the hospital.  Please read over the following fact sheets that you were given. Pain Booklet, Coughing and Deep Breathing, MRSA Information and Surgical Site Infection Prevention

## 2016-05-07 ENCOUNTER — Encounter (HOSPITAL_COMMUNITY): Payer: Self-pay

## 2016-05-07 ENCOUNTER — Encounter (HOSPITAL_COMMUNITY)
Admission: RE | Admit: 2016-05-07 | Discharge: 2016-05-07 | Disposition: A | Discharge status: Home / Self Care | Payer: Medicare Other | Source: Ambulatory Visit | Attending: Surgery | Admitting: Surgery

## 2016-05-07 DIAGNOSIS — E1122 Type 2 diabetes mellitus with diabetic chronic kidney disease: Secondary | ICD-10-CM | POA: Insufficient documentation

## 2016-05-07 DIAGNOSIS — Z0183 Encounter for blood typing: Secondary | ICD-10-CM | POA: Diagnosis not present

## 2016-05-07 DIAGNOSIS — Z01818 Encounter for other preprocedural examination: Secondary | ICD-10-CM | POA: Insufficient documentation

## 2016-05-07 DIAGNOSIS — I129 Hypertensive chronic kidney disease with stage 1 through stage 4 chronic kidney disease, or unspecified chronic kidney disease: Secondary | ICD-10-CM | POA: Insufficient documentation

## 2016-05-07 DIAGNOSIS — Z87891 Personal history of nicotine dependence: Secondary | ICD-10-CM | POA: Diagnosis not present

## 2016-05-07 DIAGNOSIS — I739 Peripheral vascular disease, unspecified: Secondary | ICD-10-CM | POA: Insufficient documentation

## 2016-05-07 DIAGNOSIS — N189 Chronic kidney disease, unspecified: Secondary | ICD-10-CM | POA: Insufficient documentation

## 2016-05-07 DIAGNOSIS — Z79899 Other long term (current) drug therapy: Secondary | ICD-10-CM | POA: Diagnosis not present

## 2016-05-07 DIAGNOSIS — E785 Hyperlipidemia, unspecified: Secondary | ICD-10-CM | POA: Diagnosis not present

## 2016-05-07 DIAGNOSIS — Z01812 Encounter for preprocedural laboratory examination: Secondary | ICD-10-CM | POA: Diagnosis not present

## 2016-05-07 DIAGNOSIS — Z955 Presence of coronary angioplasty implant and graft: Secondary | ICD-10-CM | POA: Diagnosis not present

## 2016-05-07 DIAGNOSIS — Z7982 Long term (current) use of aspirin: Secondary | ICD-10-CM | POA: Diagnosis not present

## 2016-05-07 DIAGNOSIS — I2510 Atherosclerotic heart disease of native coronary artery without angina pectoris: Secondary | ICD-10-CM | POA: Insufficient documentation

## 2016-05-07 HISTORY — DX: Chronic kidney disease, unspecified: N18.9

## 2016-05-07 HISTORY — DX: Presence of spectacles and contact lenses: Z97.3

## 2016-05-07 HISTORY — DX: Unspecified hearing loss, unspecified ear: H91.90

## 2016-05-07 HISTORY — DX: Unspecified urinary incontinence: R32

## 2016-05-07 HISTORY — DX: Personal history of other diseases of the respiratory system: Z87.09

## 2016-05-07 HISTORY — DX: Unspecified osteoarthritis, unspecified site: M19.90

## 2016-05-07 HISTORY — DX: Personal history of urinary calculi: Z87.442

## 2016-05-07 LAB — COMPREHENSIVE METABOLIC PANEL
ALT: 23 U/L (ref 17–63)
AST: 28 U/L (ref 15–41)
Albumin: 3.7 g/dL (ref 3.5–5.0)
Alkaline Phosphatase: 32 U/L — ABNORMAL LOW (ref 38–126)
Anion gap: 7 (ref 5–15)
BILIRUBIN TOTAL: 0.6 mg/dL (ref 0.3–1.2)
BUN: 19 mg/dL (ref 6–20)
CO2: 29 mmol/L (ref 22–32)
CREATININE: 1.4 mg/dL — AB (ref 0.61–1.24)
Calcium: 10.2 mg/dL (ref 8.9–10.3)
Chloride: 104 mmol/L (ref 101–111)
GFR calc Af Amer: 54 mL/min — ABNORMAL LOW (ref >60)
GFR, EST NON AFRICAN AMERICAN: 47 mL/min — AB (ref >60)
Glucose, Bld: 107 mg/dL — ABNORMAL HIGH (ref 65–99)
POTASSIUM: 4.2 mmol/L (ref 3.5–5.1)
Sodium: 140 mmol/L (ref 135–145)
TOTAL PROTEIN: 7.3 g/dL (ref 6.5–8.1)

## 2016-05-07 LAB — URINALYSIS, ROUTINE W REFLEX MICROSCOPIC
Bilirubin Urine: NEGATIVE
Glucose, UA: NEGATIVE mg/dL
Hgb urine dipstick: NEGATIVE
Ketones, ur: NEGATIVE mg/dL
LEUKOCYTES UA: NEGATIVE
NITRITE: NEGATIVE
PH: 5.5 (ref 5.0–8.0)
Protein, ur: NEGATIVE mg/dL
SPECIFIC GRAVITY, URINE: 1.019 (ref 1.005–1.030)

## 2016-05-07 LAB — SURGICAL PCR SCREEN
MRSA, PCR: NEGATIVE
Staphylococcus aureus: NEGATIVE

## 2016-05-07 LAB — CBC
HEMATOCRIT: 40.5 % (ref 39.0–52.0)
Hemoglobin: 13.9 g/dL (ref 13.0–17.0)
MCH: 31.1 pg (ref 26.0–34.0)
MCHC: 34.3 g/dL (ref 30.0–36.0)
MCV: 90.6 fL (ref 78.0–100.0)
Platelets: 235 10*3/uL (ref 150–400)
RBC: 4.47 MIL/uL (ref 4.22–5.81)
RDW: 13.9 % (ref 11.5–15.5)
WBC: 10.5 10*3/uL (ref 4.0–10.5)

## 2016-05-07 LAB — TYPE AND SCREEN
ABO/RH(D): A NEG
Antibody Screen: NEGATIVE

## 2016-05-07 LAB — PROTIME-INR
INR: 1.07
PROTHROMBIN TIME: 14 s (ref 11.4–15.2)

## 2016-05-07 LAB — GLUCOSE, CAPILLARY: GLUCOSE-CAPILLARY: 109 mg/dL — AB (ref 65–99)

## 2016-05-07 LAB — ABO/RH: ABO/RH(D): A NEG

## 2016-05-07 LAB — APTT: aPTT: 32 seconds (ref 24–36)

## 2016-05-07 NOTE — Progress Notes (Addendum)
Anesthesia Chart Review:  Pt is a 75 year old male scheduled for R femoral endarterectomy, patch angioplasty on 05/13/2016 with Harold Barban, MD.   - Cardiologist is Quay Burow, MD who referred pt to Dr. Trula Slade for surgery.  - PCP is Willey Blade, MD.   PMH includes:  CAD (PCI to [place]), HTN, DM, hyperlipidemia, CKD, PAD. Hard of hearing. Current smoker. BMI 25  Medications include: ASA 325, lipitor, doxazosin, fenofibrate, levemir, metoprolol.   Preoperative labs reviewed.    CXR 02/13/16:  1. Stable mild cardiomegaly. Mild hyperinflation indicating COPD and/or asthma. No acute cardiopulmonary disease. 2. Thoracic aortic atherosclerosis.  EKG 02/20/16: NSR. Incomplete LBBB. Appears stable when compared to EKG 09/21/14.   If no changes, I anticipate pt can proceed with surgery as scheduled.   Willeen Cass, FNP-BC [facility] Short Stay Surgical Center/Anesthesiology Phone: [phone] 05/08/2016 1:57 PM

## 2016-05-07 NOTE — Progress Notes (Signed)
PCP - Dr. Willey Blade Cardiologist - Dr. Gwenlyn Found Nephrologist - Dr. Marval Regal  EKG - 02/20/16 CXR - 02/13/16 Echo/Stress test - denies Cardiac Cath - 1997  Patient denies chest pain and shortness of breath at PAT appointment.    Patient states that he chest his blood sugar at least twice a day and that his fasting glucose is 110.

## 2016-05-13 ENCOUNTER — Encounter (HOSPITAL_COMMUNITY)
Admission: RE | Disposition: A | Discharge status: Home / Self Care | Payer: Self-pay | Source: Ambulatory Visit | Attending: Surgery

## 2016-05-13 ENCOUNTER — Encounter (HOSPITAL_COMMUNITY): Payer: Self-pay | Admitting: *Deleted

## 2016-05-13 ENCOUNTER — Inpatient Hospital Stay (HOSPITAL_COMMUNITY): Payer: Medicare Other | Admitting: Anesthesiology

## 2016-05-13 ENCOUNTER — Inpatient Hospital Stay (HOSPITAL_COMMUNITY): Payer: Medicare Other | Admitting: Emergency Medicine

## 2016-05-13 ENCOUNTER — Inpatient Hospital Stay (HOSPITAL_COMMUNITY)
Admission: RE | Admit: 2016-05-13 | Discharge: 2016-05-14 | DRG: 272 | Disposition: A | Discharge status: Home / Self Care | Payer: Medicare Other | Source: Ambulatory Visit | Attending: Surgery | Admitting: Surgery

## 2016-05-13 DIAGNOSIS — Z794 Long term (current) use of insulin: Secondary | ICD-10-CM

## 2016-05-13 DIAGNOSIS — E860 Dehydration: Secondary | ICD-10-CM | POA: Diagnosis not present

## 2016-05-13 DIAGNOSIS — Z23 Encounter for immunization: Secondary | ICD-10-CM | POA: Diagnosis not present

## 2016-05-13 DIAGNOSIS — Z833 Family history of diabetes mellitus: Secondary | ICD-10-CM

## 2016-05-13 DIAGNOSIS — I2510 Atherosclerotic heart disease of native coronary artery without angina pectoris: Secondary | ICD-10-CM | POA: Diagnosis not present

## 2016-05-13 DIAGNOSIS — Z87442 Personal history of urinary calculi: Secondary | ICD-10-CM | POA: Diagnosis not present

## 2016-05-13 DIAGNOSIS — E1151 Type 2 diabetes mellitus with diabetic peripheral angiopathy without gangrene: Secondary | ICD-10-CM | POA: Diagnosis not present

## 2016-05-13 DIAGNOSIS — E7800 Pure hypercholesterolemia, unspecified: Secondary | ICD-10-CM | POA: Diagnosis not present

## 2016-05-13 DIAGNOSIS — Z9861 Coronary angioplasty status: Secondary | ICD-10-CM | POA: Diagnosis not present

## 2016-05-13 DIAGNOSIS — I70211 Atherosclerosis of native arteries of extremities with intermittent claudication, right leg: Secondary | ICD-10-CM | POA: Diagnosis not present

## 2016-05-13 DIAGNOSIS — I10 Essential (primary) hypertension: Secondary | ICD-10-CM | POA: Diagnosis present

## 2016-05-13 DIAGNOSIS — F17210 Nicotine dependence, cigarettes, uncomplicated: Secondary | ICD-10-CM | POA: Diagnosis present

## 2016-05-13 DIAGNOSIS — N189 Chronic kidney disease, unspecified: Secondary | ICD-10-CM | POA: Diagnosis not present

## 2016-05-13 DIAGNOSIS — I739 Peripheral vascular disease, unspecified: Secondary | ICD-10-CM | POA: Diagnosis not present

## 2016-05-13 DIAGNOSIS — H9190 Unspecified hearing loss, unspecified ear: Secondary | ICD-10-CM | POA: Diagnosis not present

## 2016-05-13 HISTORY — PX: PATCH ANGIOPLASTY: SHX6230

## 2016-05-13 HISTORY — DX: Pneumonia, unspecified organism: J18.9

## 2016-05-13 HISTORY — PX: ENDARTERECTOMY FEMORAL: SHX5804

## 2016-05-13 LAB — GLUCOSE, CAPILLARY
GLUCOSE-CAPILLARY: 64 mg/dL — AB (ref 65–99)
GLUCOSE-CAPILLARY: 157 mg/dL — AB (ref 65–99)
Glucose-Capillary: 100 mg/dL — ABNORMAL HIGH (ref 65–99)
Glucose-Capillary: 93 mg/dL (ref 65–99)
Glucose-Capillary: 170 mg/dL — ABNORMAL HIGH (ref 65–99)

## 2016-05-13 LAB — CBC
HCT: 37.6 % — ABNORMAL LOW (ref 39.0–52.0)
Hemoglobin: 12.6 g/dL — ABNORMAL LOW (ref 13.0–17.0)
MCH: 30.8 pg (ref 26.0–34.0)
MCHC: 33.5 g/dL (ref 30.0–36.0)
MCV: 91.9 fL (ref 78.0–100.0)
PLATELETS: 183 10*3/uL (ref 150–400)
RBC: 4.09 MIL/uL — AB (ref 4.22–5.81)
RDW: 14.3 % (ref 11.5–15.5)
WBC: 8.1 10*3/uL (ref 4.0–10.5)

## 2016-05-13 LAB — CREATININE, SERUM
CREATININE: 1.35 mg/dL — AB (ref 0.61–1.24)
GFR calc Af Amer: 57 mL/min — ABNORMAL LOW (ref >60)
GFR, EST NON AFRICAN AMERICAN: 49 mL/min — AB (ref >60)

## 2016-05-13 SURGERY — ENDARTERECTOMY, FEMORAL
Anesthesia: General | Site: Groin | Laterality: Right

## 2016-05-13 MED ORDER — CHLORHEXIDINE GLUCONATE CLOTH 2 % EX PADS: 6.0000 | MEDICATED_PAD | Freq: Once | CUTANEOUS | Status: DC

## 2016-05-13 MED ORDER — OMEGA-3-ACID ETHYL ESTERS 1 G PO CAPS
2.0000 g | ORAL_CAPSULE | Freq: Two times a day (BID) | ORAL | Status: DC
  Administered 2016-05-13: 2 g via ORAL
  Filled 2016-05-13 (×2): qty 2

## 2016-05-13 MED ORDER — ONDANSETRON HCL 4 MG/2ML IJ SOLN
INTRAMUSCULAR | Status: AC
  Filled 2016-05-13: qty 2

## 2016-05-13 MED ORDER — PHENYLEPHRINE HCL 10 MG/ML IJ SOLN
INTRAMUSCULAR | Status: DC | PRN
  Administered 2016-05-13 (×3): 120 ug via INTRAVENOUS
  Administered 2016-05-13: 40 ug via INTRAVENOUS

## 2016-05-13 MED ORDER — ASPIRIN EC 325 MG PO TBEC
325.0000 mg | DELAYED_RELEASE_TABLET | Freq: Every day | ORAL | Status: DC
  Filled 2016-05-13: qty 1

## 2016-05-13 MED ORDER — MORPHINE SULFATE (PF) 2 MG/ML IV SOLN: 2.0000 mg | INTRAVENOUS | Status: DC | PRN

## 2016-05-13 MED ORDER — LACTATED RINGERS IV SOLN
INTRAVENOUS | Status: DC | PRN
  Administered 2016-05-13 (×2): via INTRAVENOUS

## 2016-05-13 MED ORDER — ACETAMINOPHEN 325 MG RE SUPP: 325.0000 mg | RECTAL | Status: DC | PRN

## 2016-05-13 MED ORDER — DEXTROSE 5 % IV SOLN
1.5000 g | Freq: Four times a day (QID) | INTRAVENOUS | Status: AC
  Administered 2016-05-13 (×2): 1.5 g via INTRAVENOUS
  Filled 2016-05-13 (×2): qty 1.5

## 2016-05-13 MED ORDER — VITAMIN D 1000 UNITS PO TABS
1000.0000 [IU] | ORAL_TABLET | Freq: Every day | ORAL | Status: DC
  Administered 2016-05-13: 1000 [IU] via ORAL
  Filled 2016-05-13 (×2): qty 1

## 2016-05-13 MED ORDER — GUAIFENESIN-DM 100-10 MG/5ML PO SYRP: 15.0000 mL | ORAL_SOLUTION | ORAL | Status: DC | PRN

## 2016-05-13 MED ORDER — ONDANSETRON HCL 4 MG/2ML IJ SOLN: 4.0000 mg | Freq: Once | INTRAMUSCULAR | Status: DC | PRN

## 2016-05-13 MED ORDER — PROPOFOL 10 MG/ML IV BOLUS
INTRAVENOUS | Status: DC | PRN
  Administered 2016-05-13: 100 mg via INTRAVENOUS

## 2016-05-13 MED ORDER — SODIUM CHLORIDE 0.9 % IV SOLN
INTRAVENOUS | Status: DC
  Administered 2016-05-13: 22:00:00 via INTRAVENOUS

## 2016-05-13 MED ORDER — GLYCOPYRROLATE 0.2 MG/ML IJ SOLN
INTRAMUSCULAR | Status: DC | PRN
  Administered 2016-05-13: .2 mg via INTRAVENOUS

## 2016-05-13 MED ORDER — FENTANYL CITRATE (PF) 100 MCG/2ML IJ SOLN
INTRAMUSCULAR | Status: DC | PRN
  Administered 2016-05-13: 50 ug via INTRAVENOUS
  Administered 2016-05-13: 100 ug via INTRAVENOUS
  Administered 2016-05-13: 50 ug via INTRAVENOUS

## 2016-05-13 MED ORDER — OMEGA-3 FATTY ACIDS 1000 MG PO CAPS: 2.0000 g | ORAL_CAPSULE | Freq: Two times a day (BID) | ORAL | Status: DC

## 2016-05-13 MED ORDER — LIDOCAINE HCL (CARDIAC) 20 MG/ML IV SOLN
INTRAVENOUS | Status: DC | PRN
  Administered 2016-05-13: 100 mg via INTRAVENOUS

## 2016-05-13 MED ORDER — FENOFIBRATE 160 MG PO TABS
160.0000 mg | ORAL_TABLET | Freq: Every day | ORAL | Status: DC
  Administered 2016-05-13: 160 mg via ORAL
  Filled 2016-05-13 (×2): qty 1

## 2016-05-13 MED ORDER — DOXAZOSIN MESYLATE 2 MG PO TABS
2.0000 mg | ORAL_TABLET | Freq: Every day | ORAL | Status: DC
  Administered 2016-05-13: 2 mg via ORAL
  Filled 2016-05-13 (×2): qty 1

## 2016-05-13 MED ORDER — PROPOFOL 10 MG/ML IV BOLUS
INTRAVENOUS | Status: AC
  Filled 2016-05-13: qty 20

## 2016-05-13 MED ORDER — SUGAMMADEX SODIUM 200 MG/2ML IV SOLN
INTRAVENOUS | Status: DC | PRN
  Administered 2016-05-13: 150 mg via INTRAVENOUS

## 2016-05-13 MED ORDER — HYDROMORPHONE HCL 1 MG/ML IJ SOLN: 0.2500 mg | INTRAMUSCULAR | Status: DC | PRN

## 2016-05-13 MED ORDER — DEXTROSE 5 % IV SOLN
1.5000 g | INTRAVENOUS | Status: AC
  Administered 2016-05-13: 1.5 g via INTRAVENOUS
  Filled 2016-05-13: qty 1.5

## 2016-05-13 MED ORDER — SODIUM CHLORIDE 0.9 % IV SOLN: 500.0000 mL | Freq: Once | INTRAVENOUS | Status: DC | PRN

## 2016-05-13 MED ORDER — ONDANSETRON HCL 4 MG/2ML IJ SOLN: 4.0000 mg | Freq: Four times a day (QID) | INTRAMUSCULAR | Status: DC | PRN

## 2016-05-13 MED ORDER — PHENYLEPHRINE HCL 10 MG/ML IJ SOLN
INTRAVENOUS | Status: DC | PRN
  Administered 2016-05-13: 50 ug/min via INTRAVENOUS

## 2016-05-13 MED ORDER — HEPARIN SODIUM (PORCINE) 1000 UNIT/ML IJ SOLN
INTRAMUSCULAR | Status: AC
  Filled 2016-05-13: qty 2

## 2016-05-13 MED ORDER — EPHEDRINE 5 MG/ML INJ
INTRAVENOUS | Status: AC
  Filled 2016-05-13: qty 10

## 2016-05-13 MED ORDER — HYDRALAZINE HCL 20 MG/ML IJ SOLN: 5.0000 mg | INTRAMUSCULAR | Status: DC | PRN

## 2016-05-13 MED ORDER — PHENYLEPHRINE 40 MCG/ML (10ML) SYRINGE FOR IV PUSH (FOR BLOOD PRESSURE SUPPORT)
PREFILLED_SYRINGE | INTRAVENOUS | Status: AC
  Filled 2016-05-13: qty 10

## 2016-05-13 MED ORDER — INSULIN DETEMIR 100 UNIT/ML ~~LOC~~ SOLN
30.0000 [IU] | Freq: Two times a day (BID) | SUBCUTANEOUS | Status: DC
  Administered 2016-05-13: 30 [IU] via SUBCUTANEOUS
  Filled 2016-05-13 (×3): qty 0.3

## 2016-05-13 MED ORDER — OXYCODONE-ACETAMINOPHEN 5-325 MG PO TABS: 1.0000 | ORAL_TABLET | ORAL | Status: DC | PRN

## 2016-05-13 MED ORDER — HEMOSTATIC AGENTS (NO CHARGE) OPTIME
TOPICAL | Status: DC | PRN
  Administered 2016-05-13: 1 via TOPICAL

## 2016-05-13 MED ORDER — FENTANYL CITRATE (PF) 250 MCG/5ML IJ SOLN
INTRAMUSCULAR | Status: AC
  Filled 2016-05-13: qty 10

## 2016-05-13 MED ORDER — SUCCINYLCHOLINE CHLORIDE 200 MG/10ML IV SOSY
PREFILLED_SYRINGE | INTRAVENOUS | Status: AC
  Filled 2016-05-13: qty 10

## 2016-05-13 MED ORDER — LABETALOL HCL 5 MG/ML IV SOLN: 10.0000 mg | INTRAVENOUS | Status: DC | PRN

## 2016-05-13 MED ORDER — 0.9 % SODIUM CHLORIDE (POUR BTL) OPTIME
TOPICAL | Status: DC | PRN
  Administered 2016-05-13: 2000 mL

## 2016-05-13 MED ORDER — ATORVASTATIN CALCIUM 20 MG PO TABS
20.0000 mg | ORAL_TABLET | Freq: Every day | ORAL | Status: DC
  Administered 2016-05-13: 20 mg via ORAL
  Filled 2016-05-13: qty 1

## 2016-05-13 MED ORDER — SODIUM CHLORIDE 0.9 % IV SOLN: INTRAVENOUS | Status: DC

## 2016-05-13 MED ORDER — ALUM & MAG HYDROXIDE-SIMETH 200-200-20 MG/5ML PO SUSP
15.0000 mL | ORAL | Status: DC | PRN
Start: 2016-05-13 — End: 2016-05-14

## 2016-05-13 MED ORDER — INFLUENZA VAC SPLIT QUAD 0.5 ML IM SUSY
0.5000 mL | PREFILLED_SYRINGE | INTRAMUSCULAR | Status: AC
  Administered 2016-05-14: 0.5 mL via INTRAMUSCULAR
  Filled 2016-05-13: qty 0.5

## 2016-05-13 MED ORDER — LIDOCAINE 2% (20 MG/ML) 5 ML SYRINGE
INTRAMUSCULAR | Status: AC
  Filled 2016-05-13: qty 10

## 2016-05-13 MED ORDER — METOPROLOL TARTRATE 5 MG/5ML IV SOLN: 2.0000 mg | INTRAVENOUS | Status: DC | PRN

## 2016-05-13 MED ORDER — DEXTROSE 5 % IV SOLN
1.5000 g | Freq: Two times a day (BID) | INTRAVENOUS | Status: DC
  Filled 2016-05-13: qty 1.5

## 2016-05-13 MED ORDER — MAGNESIUM SULFATE 2 GM/50ML IV SOLN: 2.0000 g | Freq: Every day | INTRAVENOUS | Status: DC | PRN

## 2016-05-13 MED ORDER — PROTAMINE SULFATE 10 MG/ML IV SOLN
INTRAVENOUS | Status: AC
  Filled 2016-05-13: qty 5

## 2016-05-13 MED ORDER — PANTOPRAZOLE SODIUM 40 MG PO TBEC
40.0000 mg | DELAYED_RELEASE_TABLET | Freq: Every day | ORAL | Status: DC
  Administered 2016-05-13: 40 mg via ORAL
  Filled 2016-05-13 (×2): qty 1

## 2016-05-13 MED ORDER — INSULIN ASPART 100 UNIT/ML ~~LOC~~ SOLN
0.0000 [IU] | Freq: Three times a day (TID) | SUBCUTANEOUS | Status: DC
  Administered 2016-05-13 – 2016-05-14 (×2): 3 [IU] via SUBCUTANEOUS

## 2016-05-13 MED ORDER — PROTAMINE SULFATE 10 MG/ML IV SOLN
INTRAVENOUS | Status: DC | PRN
  Administered 2016-05-13: 10 mg via INTRAVENOUS
  Administered 2016-05-13: 40 mg via INTRAVENOUS

## 2016-05-13 MED ORDER — HEPARIN SODIUM (PORCINE) 1000 UNIT/ML IJ SOLN
INTRAMUSCULAR | Status: DC | PRN
  Administered 2016-05-13: 7000 [IU] via INTRAVENOUS

## 2016-05-13 MED ORDER — ROCURONIUM BROMIDE 10 MG/ML (PF) SYRINGE
PREFILLED_SYRINGE | INTRAVENOUS | Status: AC
  Filled 2016-05-13: qty 10

## 2016-05-13 MED ORDER — ENOXAPARIN SODIUM 40 MG/0.4ML ~~LOC~~ SOLN: 40.0000 mg | SUBCUTANEOUS | Status: DC

## 2016-05-13 MED ORDER — ACETAMINOPHEN 325 MG PO TABS
325.0000 mg | ORAL_TABLET | ORAL | Status: DC | PRN
Start: 2016-05-13 — End: 2016-05-14

## 2016-05-13 MED ORDER — HEPARIN SODIUM (PORCINE) 5000 UNIT/ML IJ SOLN
INTRAMUSCULAR | Status: DC | PRN
  Administered 2016-05-13: 500 mL

## 2016-05-13 MED ORDER — METOPROLOL TARTRATE 50 MG PO TABS
50.0000 mg | ORAL_TABLET | Freq: Two times a day (BID) | ORAL | Status: DC
  Administered 2016-05-13: 50 mg via ORAL
  Filled 2016-05-13 (×2): qty 1

## 2016-05-13 MED ORDER — DOCUSATE SODIUM 100 MG PO CAPS
100.0000 mg | ORAL_CAPSULE | Freq: Every day | ORAL | Status: DC
  Filled 2016-05-13: qty 1

## 2016-05-13 MED ORDER — PHENOL 1.4 % MT LIQD: 1.0000 | OROMUCOSAL | Status: DC | PRN

## 2016-05-13 MED ORDER — POTASSIUM CHLORIDE CRYS ER 20 MEQ PO TBCR: 20.0000 meq | EXTENDED_RELEASE_TABLET | Freq: Every day | ORAL | Status: DC | PRN

## 2016-05-13 MED ORDER — ROCURONIUM BROMIDE 100 MG/10ML IV SOLN
INTRAVENOUS | Status: DC | PRN
  Administered 2016-05-13: 60 mg via INTRAVENOUS

## 2016-05-13 SURGICAL SUPPLY — 50 items
BANDAGE ACE 4X5 VEL STRL LF (GAUZE/BANDAGES/DRESSINGS) IMPLANT
CANISTER SUCTION 2500CC (MISCELLANEOUS) ×3 IMPLANT
CATH EMB 4FR 80CM (CATHETERS) ×3 IMPLANT
CLIP TI MEDIUM 24 (CLIP) ×3 IMPLANT
CLIP TI WIDE RED SMALL 24 (CLIP) ×3 IMPLANT
DERMABOND ADVANCED (GAUZE/BANDAGES/DRESSINGS) ×2
DERMABOND ADVANCED .7 DNX12 (GAUZE/BANDAGES/DRESSINGS) ×1 IMPLANT
DRAIN CHANNEL 15F RND FF W/TCR (WOUND CARE) IMPLANT
DRAPE X-RAY CASS 24X20 (DRAPES) IMPLANT
DRSG COVADERM 4X10 (GAUZE/BANDAGES/DRESSINGS) IMPLANT
DRSG COVADERM 4X8 (GAUZE/BANDAGES/DRESSINGS) IMPLANT
ELECT REM PT RETURN 9FT ADLT (ELECTROSURGICAL) ×3
ELECTRODE REM PT RTRN 9FT ADLT (ELECTROSURGICAL) ×1 IMPLANT
EVACUATOR SILICONE 100CC (DRAIN) IMPLANT
GLOVE BIO SURGEON STRL SZ 6.5 (GLOVE) ×2 IMPLANT
GLOVE BIO SURGEONS STRL SZ 6.5 (GLOVE) ×1
GLOVE BIOGEL PI IND STRL 6.5 (GLOVE) ×2 IMPLANT
GLOVE BIOGEL PI IND STRL 7.5 (GLOVE) ×1 IMPLANT
GLOVE BIOGEL PI INDICATOR 6.5 (GLOVE) ×4
GLOVE BIOGEL PI INDICATOR 7.5 (GLOVE) ×2
GLOVE SURG SS PI 7.5 STRL IVOR (GLOVE) ×3 IMPLANT
GOWN STRL REUS W/ TWL LRG LVL3 (GOWN DISPOSABLE) ×2 IMPLANT
GOWN STRL REUS W/ TWL XL LVL3 (GOWN DISPOSABLE) ×1 IMPLANT
GOWN STRL REUS W/TWL LRG LVL3 (GOWN DISPOSABLE) ×4
GOWN STRL REUS W/TWL XL LVL3 (GOWN DISPOSABLE) ×2
HEMOSTAT SNOW SURGICEL 2X4 (HEMOSTASIS) ×3 IMPLANT
KIT BASIN OR (CUSTOM PROCEDURE TRAY) ×3 IMPLANT
KIT ROOM TURNOVER OR (KITS) ×3 IMPLANT
NS IRRIG 1000ML POUR BTL (IV SOLUTION) ×6 IMPLANT
PACK PERIPHERAL VASCULAR (CUSTOM PROCEDURE TRAY) ×3 IMPLANT
PAD ARMBOARD 7.5X6 YLW CONV (MISCELLANEOUS) ×6 IMPLANT
PATCH VASC XENOSURE 1CMX6CM (Vascular Products) ×2 IMPLANT
PATCH VASC XENOSURE 1X6 (Vascular Products) ×1 IMPLANT
SET COLLECT BLD 21X3/4 12 (NEEDLE) IMPLANT
SPONGE INTESTINAL PEANUT (DISPOSABLE) IMPLANT
STOPCOCK 4 WAY LG BORE MALE ST (IV SETS) ×3 IMPLANT
SUT ETHILON 3 0 PS 1 (SUTURE) IMPLANT
SUT PROLENE 5 0 C 1 24 (SUTURE) ×6 IMPLANT
SUT PROLENE 6 0 BV (SUTURE) ×3 IMPLANT
SUT PROLENE 7 0 BV1 MDA (SUTURE) ×3 IMPLANT
SUT VIC AB 2-0 CT1 27 (SUTURE)
SUT VIC AB 2-0 CT1 TAPERPNT 27 (SUTURE) IMPLANT
SUT VIC AB 2-0 CTB1 (SUTURE) ×3 IMPLANT
SUT VIC AB 3-0 SH 27 (SUTURE) ×4
SUT VIC AB 3-0 SH 27X BRD (SUTURE) ×2 IMPLANT
SUT VICRYL 4-0 PS2 18IN ABS (SUTURE) ×3 IMPLANT
SYR 3ML LL SCALE MARK (SYRINGE) ×3 IMPLANT
TUBING EXTENTION W/L.L. (IV SETS) IMPLANT
UNDERPAD 30X30 (UNDERPADS AND DIAPERS) ×3 IMPLANT
WATER STERILE IRR 1000ML POUR (IV SOLUTION) ×3 IMPLANT

## 2016-05-13 NOTE — Anesthesia Postprocedure Evaluation (Signed)
Anesthesia Post Note  Patient: Everett Graff  Procedure(s) Performed: Procedure(s) (LRB): RIGHT ENDARTERECTOMY FEMORAL (Right) PATCH ANGIOPLASTY USING XENOSURE BIOLOGIC PATCH (Right)  Patient location during evaluation: PACU Anesthesia Type: General Level of consciousness: awake and alert Pain management: pain level controlled Vital Signs Assessment: post-procedure vital signs reviewed and stable Respiratory status: spontaneous breathing, nonlabored ventilation, respiratory function stable and patient connected to nasal cannula oxygen Cardiovascular status: blood pressure returned to baseline and stable Postop Assessment: no signs of nausea or vomiting Anesthetic complications: no    Last Vitals:  Vitals:   05/13/16 1045 05/13/16 1100  BP: 138/73 (!) 143/68  Pulse: 73 64  Resp: 15 13  Temp:      Last Pain:  Vitals:   05/13/16 1045  TempSrc:   PainSc: 0-No pain                 Zenaida Deed

## 2016-05-13 NOTE — Transfer of Care (Signed)
Immediate Anesthesia Transfer of Care Note  Patient: Arthur [name]  Procedure(s) Performed: Procedure(s): RIGHT ENDARTERECTOMY FEMORAL (Right) PATCH ANGIOPLASTY USING XENOSURE BIOLOGIC PATCH (Right)  Patient Location: PACU  Anesthesia Type:General  Level of Consciousness: awake, alert , oriented and patient cooperative  Airway & Oxygen Therapy: Patient Spontanous Breathing and Patient connected to nasal cannula oxygen  Post-op Assessment: Report given to RN and Post -op Vital signs reviewed and stable  Post vital signs: Reviewed and stable  Last Vitals:  Vitals:   05/13/16 0637  BP: (!) 179/77  Pulse: 60  Resp: 18  Temp: 36.6 C    Last Pain:  Vitals:   05/13/16 0637  TempSrc: Oral         Complications: No apparent anesthesia complications

## 2016-05-13 NOTE — Anesthesia Preprocedure Evaluation (Signed)
Anesthesia Evaluation  Patient identified by MRN, date of birth, ID band Patient awake    Reviewed: Allergy & Precautions, H&P , NPO status , Patient's Chart, lab work & pertinent test results  History of Anesthesia Complications Negative for: history of anesthetic complications  Airway Mallampati: II  TM Distance: >3 FB Neck ROM: full    Dental no notable dental hx.    Pulmonary Current Smoker,    Pulmonary exam normal breath sounds clear to auscultation       Cardiovascular hypertension, + CAD and + Peripheral Vascular Disease  Normal cardiovascular exam Rhythm:regular Rate:Normal     Neuro/Psych negative neurological ROS     GI/Hepatic negative GI ROS, Neg liver ROS,   Endo/Other  diabetes, Insulin Dependent  Renal/GU Renal disease     Musculoskeletal  (+) Arthritis ,   Abdominal   Peds  Hematology negative hematology ROS (+)   Anesthesia Other Findings   Reproductive/Obstetrics negative OB ROS                             Anesthesia Physical Anesthesia Plan  ASA: III  Anesthesia Plan: General   Post-op Pain Management:    Induction: Intravenous  Airway Management Planned: Oral ETT  Additional Equipment:   Intra-op Plan:   Post-operative Plan: Extubation in OR  Informed Consent: I have reviewed the patients History and Physical, chart, labs and discussed the procedure including the risks, benefits and alternatives for the proposed anesthesia with the patient or authorized representative who has indicated his/her understanding and acceptance.   Dental Advisory Given  Plan Discussed with: Anesthesiologist, CRNA and Surgeon  Anesthesia Plan Comments:         Anesthesia Quick Evaluation

## 2016-05-13 NOTE — Addendum Note (Signed)
Addendum  created 05/13/16 1157 by Glyn Ade, CRNA   Anesthesia Intra Meds edited

## 2016-05-13 NOTE — H&P (View-Only) (Signed)
[facility] and [facility] Specialist of [facility]  Patient name: Arthur [name] MRN: [id number] DOB: 1940/11/18 Sex: male  REFERRING PHYSICIAN: Dr. Gwenlyn Found  REASON FOR CONSULT: claudication  HPI: Arthur [name] is a 75 y.o. male, who is referred today for claudication.  The patient states that he has difficulty with his right leg when walking several 100 feet.  He underwent angiography which showed a focal calcific plaque at the right femoral bifurcation.  He does not have any ulcers.  The patient has a history of coronary artery disease, status post PCI he continues to smoke.  He suffers from hypercholesterolemia which is managed with a statin.  The patient is a diabetic, treated with insulin.  Past Medical History:  Diagnosis Date  . Claudication ([place])   . Coronary artery disease 1997   CFX PCI, '97. Mod residual RI and RCA dis.  . Diabetes mellitus ([place])    IDDM  . Dyslipidemia   . HTN (hypertension)   . PAD (peripheral artery disease) ([place])   . Tobacco abuse     Family History  Problem Relation Age of Onset  . Diabetes Sister     SOCIAL HISTORY: Social History   Social History  . Marital status: Married    Spouse name: N/A  . Number of children: N/A  . Years of education: N/A   Occupational History  . Not on file.   Social History Main Topics  . Smoking status: Current Every Day Smoker    Packs/day: 0.50    Years: 65.00    Types: Cigarettes  . Smokeless tobacco: Never Used     Comment: 3 cigs a day  . Alcohol use No  . Drug use: No  . Sexual activity: Not on file   Other Topics Concern  . Not on file   Social History Narrative   Retired from [employer]. Married 2 children, 11 grand children, 6 great grand children    Allergies  Allergen Reactions  . Codeine Swelling    Current Outpatient Prescriptions  Medication Sig Dispense Refill  . aspirin 325 MG EC tablet Take 325 mg by mouth daily.    Marland Kitchen atorvastatin (LIPITOR) 20 MG tablet  TAKE 1 TABLET (20 MG TOTAL) BY MOUTH DAILY. 90 tablet 2  . cholecalciferol (VITAMIN D) 1000 UNITS tablet Take 1,000 Units by mouth daily.    Marland Kitchen doxazosin (CARDURA) 1 MG tablet TAKE 2 TABLETS AT BEDTIME FOR BLOOD PRESSURE AND URINE FLOW 180 tablet 3  . fenofibrate 160 MG tablet Take 1 tablet (160 mg total) by mouth daily. 30 tablet 5  . fish oil-omega-3 fatty acids 1000 MG capsule Take 4 g by mouth daily.    . insulin detemir (LEVEMIR) 100 UNIT/ML injection Inject 30 Units into the skin 2 (two) times daily.     . metoprolol (LOPRESSOR) 50 MG tablet Take 1 tablet (50 mg total) by mouth 2 (two) times daily. 180 tablet 3   No current facility-administered medications for this visit.     REVIEW OF SYSTEMS:  '[X]'$  denotes positive finding, '[ ]'$  denotes negative finding Cardiac  Comments:  Chest pain or chest pressure: x   Shortness of breath upon exertion:    Short of breath when lying flat:    Irregular heart rhythm:        Vascular    Pain in calf, thigh, or hip brought on by ambulation: x   Pain in feet at night that wakes you up from your sleep:     Blood  clot in your veins: x   Leg swelling:         Pulmonary    Oxygen at home:    Productive cough:     Wheezing:         Neurologic    Sudden weakness in arms or legs:     Sudden numbness in arms or legs:     Sudden onset of difficulty speaking or slurred speech:    Temporary loss of vision in one eye:     Problems with dizziness:  x       Gastrointestinal    Blood in stool:     Vomited blood:         Genitourinary    Burning when urinating:     Blood in urine:        Psychiatric    Major depression:         Hematologic    Bleeding problems:    Problems with blood clotting too easily:        Skin    Rashes or ulcers:        Constitutional    Fever or chills:      PHYSICAL EXAM: Vitals:   04/20/16 0906 04/20/16 0909  BP: (!) 157/84 (!) 170/81  Pulse: 65   Resp: 16   Temp: 97.9 F (36.6 C)   TempSrc: Oral     SpO2: 97%   Weight: 173 lb (78.5 kg)   Height: '5\' 10"'$  (1.778 m)     GENERAL: The patient is a well-nourished male, in no acute distress. The vital signs are documented above. CARDIAC: There is a regular rate and rhythm.  VASCULAR: No carotid bruits PULMONARY: There is good air exchange bilaterally without wheezing or rales. MUSCULOSKELETAL: There are no major deformities or cyanosis. NEUROLOGIC: No focal weakness or paresthesias are detected. SKIN: There are no ulcers or rashes noted. PSYCHIATRIC: The patient has a normal affect.  DATA:  I have reviewed his arteriogram which shows a exophytic calcified plaque at the femoral bifurcation  ASSESSMENT AND PLAN: Right leg claudication: I discussed proceeding with right femoral endarterectomy with bovine pericardial patch angioplasty.  We discussed risks and benefits of the operation which include but are not limited to the risk of leg swelling, infection, bleeding, recurrent stenosis.  All of his questions were answered and he wishes to proceed.  His operations been scheduled for Wednesday, October 25   Annamarie Major, MD [facility]ists of [facility] [phone] Pager [phone]

## 2016-05-13 NOTE — Anesthesia Procedure Notes (Signed)
Procedure Name: Intubation Date/Time: 05/13/2016 8:38 AM Performed by: Salli Quarry Kacper Cartlidge Pre-anesthesia Checklist: Patient identified, Emergency Drugs available, Suction available and Patient being monitored Patient Re-evaluated:Patient Re-evaluated prior to inductionOxygen Delivery Method: Circle System Utilized Preoxygenation: Pre-oxygenation with 100% oxygen Intubation Type: IV induction Ventilation: Mask ventilation without difficulty Laryngoscope Size: Mac and 4 Grade View: Grade I Tube type: Oral Tube size: 7.5 mm Number of attempts: 1 Airway Equipment and Method: Stylet Placement Confirmation: ETT inserted through vocal cords under direct vision,  positive ETCO2 and breath sounds checked- equal and bilateral Secured at: 23 cm Tube secured with: Tape Dental Injury: Teeth and Oropharynx as per pre-operative assessment

## 2016-05-13 NOTE — Op Note (Signed)
Patient name: Arthur [name] MRN: [id number] DOB: 08-Mar-1941 Sex: male  05/13/2016 Pre-operative Diagnosis: Right leg claudication Post-operative diagnosis:  Same Surgeon:  Annamarie Major Assistants:  Silva Bandy Procedure:   Right iliofemoral endarterectomy with bovine pericardial patch angioplasty Anesthesia:  Gen. Blood Loss:  See anesthesia record Specimens:  none   Findings:  Nearly occlusive plaque within the common femoral artery.  There was a focal cauliflower-type lesion as well as significant posterior plaque.  Indications:  The patient suffers from claudication.  Angiography revealed a high-grade common femoral lesion.  He is here today for femoral endarterectomy.  Procedure:  The patient was identified in the holding area and taken to [place] 16  The patient was then placed supine on the table. general anesthesia was administered.  The patient was prepped and draped in the usual sterile fashion.  A time out was called and antibiotics were administered.  I used ultrasound to evaluate the location of the femoral bifurcation.  I then made an oblique incision above the groin crease.  Cautery was used was placed tissue down to the femoral sheath which was opened sharply.  I was able to expose the common femoral artery from the inguinal ligament down to the bifurcation.  I also exposed the first 1 cm of the superficial femoral and profunda femoral artery.  After everything was adequately exposed, the patient was fully heparinized.  After the heparin circulated the artery was occluded with vascular clamps.  A #11 blade was used to make an arteriotomy which was extended longitudinally with Potts scissors.  There was a focal calcified plaque which was nearly occlusive.  The patient also had significant posterior plaque.  Endarterectomy was performed with a Hector [place].  I inserted a #4 Fogarty catheter in the external iliac artery so that I could perform endarterectomy of the distal  external iliac artery.  All plaque was removed.  I then removed the posterior plaque which extended down into the profunda femoral artery.  Similarly the plaque was removed from the proximal superficial femoral artery.  I placed 3 tacking sutures in the distal common femoral artery.  The endarterectomized area was copiously irrigated and all potential embolic debris was removed.  A bovine pericardial patch was selected and patch angioplasty was performed with running 5-0 Prolene.  Prior to completion the appropriate flushing maneuvers were performed the anastomosis was completed.  The patient had excellent Doppler signals in the superficial femoral and profunda femoral artery.  50 mg of protamine was given.  Once hemostasis was satisfactory, the femoral sheath was reapproximated 2-0 Vicryl the subcutaneous tissue was closed with 3-0 Vicryl the skin was closed with 4-0 Vicryl followed by Dermabond.  There were no immediate complications.     Disposition:  To PACU in stable condition.   Theotis Burrow, M.D. [facility] [place] Office: [phone] Pager:   [phone]

## 2016-05-13 NOTE — Progress Notes (Signed)
Report given to ly rn as caregvier

## 2016-05-13 NOTE — Interval H&P Note (Signed)
History and Physical Interval Note:  05/13/2016 7:18 AM  Arthur [name]  has presented today for surgery, with the diagnosis of Peripheral vascular disease with right lower extremity claudication I70.211  The various methods of treatment have been discussed with the patient and family. After consideration of risks, benefits and other options for treatment, the patient has consented to  Procedure(s): ENDARTERECTOMY FEMORAL (Right) PATCH ANGIOPLASTY (Right) as a surgical intervention .  The patient's history has been reviewed, patient examined, no change in status, stable for surgery.  I have reviewed the patient's chart and labs.  Questions were answered to the patient's satisfaction.     Annamarie Major

## 2016-05-14 ENCOUNTER — Encounter (HOSPITAL_COMMUNITY): Payer: Medicare Other

## 2016-05-14 ENCOUNTER — Telehealth: Payer: Self-pay | Admitting: Surgery

## 2016-05-14 ENCOUNTER — Encounter (HOSPITAL_COMMUNITY): Payer: Self-pay | Admitting: Surgery

## 2016-05-14 DIAGNOSIS — Z23 Encounter for immunization: Secondary | ICD-10-CM | POA: Diagnosis not present

## 2016-05-14 LAB — BASIC METABOLIC PANEL
ANION GAP: 7 (ref 5–15)
BUN: 20 mg/dL (ref 6–20)
CALCIUM: 9.1 mg/dL (ref 8.9–10.3)
CHLORIDE: 103 mmol/L (ref 101–111)
CO2: 28 mmol/L (ref 22–32)
CREATININE: 1.53 mg/dL — AB (ref 0.61–1.24)
GFR calc non Af Amer: 42 mL/min — ABNORMAL LOW (ref >60)
GFR, EST AFRICAN AMERICAN: 49 mL/min — AB (ref >60)
Glucose, Bld: 116 mg/dL — ABNORMAL HIGH (ref 65–99)
Potassium: 3.8 mmol/L (ref 3.5–5.1)
SODIUM: 138 mmol/L (ref 135–145)

## 2016-05-14 LAB — CBC
HEMATOCRIT: 35.5 % — AB (ref 39.0–52.0)
HEMOGLOBIN: 12 g/dL — AB (ref 13.0–17.0)
MCH: 30.6 pg (ref 26.0–34.0)
MCHC: 33.8 g/dL (ref 30.0–36.0)
MCV: 90.6 fL (ref 78.0–100.0)
Platelets: 178 10*3/uL (ref 150–400)
RBC: 3.92 MIL/uL — ABNORMAL LOW (ref 4.22–5.81)
RDW: 13.9 % (ref 11.5–15.5)
WBC: 8.8 10*3/uL (ref 4.0–10.5)

## 2016-05-14 LAB — GLUCOSE, CAPILLARY: Glucose-Capillary: 197 mg/dL — ABNORMAL HIGH (ref 65–99)

## 2016-05-14 MED ORDER — OXYCODONE-ACETAMINOPHEN 5-325 MG PO TABS: 1.0000 | ORAL_TABLET | ORAL | 0 refills | Status: DC | PRN

## 2016-05-14 NOTE — Progress Notes (Signed)
  [facility]ists Progress Note  Subjective  - POD #1  Denies any pain. Has walked laps without pain.   Objective Vitals:   05/13/16 2310 05/14/16 0410  BP: (!) 118/49 138/65  Pulse: 85 75  Resp: 15 17  Temp: 99.5 F (37.5 C) 98.6 F (37 C)    Intake/Output Summary (Last 24 hours) at 05/14/16 0730 Last data filed at 05/14/16 0600  Gross per 24 hour  Intake          2640.83 ml  Output             1980 ml  Net           660.83 ml    Right groin incision c/d/i. No hematoma. Palpable right PT pulse.   Assessment/Planning: 75 y.o. male is s/p: Right iliofemoral endarterectomy with bovine pericardial patch angioplasty 1 Day Post-Op   Palpable right PT pulse this am.  Has ambulated without difficulty.  Having no pain. Baseline creatinine 1.4. Slightly elevated creatinine at 1.5 today. Likely dehydration.  D/c home today.  On ASA and plavix.  F/u in 2 weeks.   Alvia Grove 05/14/2016 7:30 AM --  Laboratory CBC    Component Value Date/Time   WBC 8.8 05/14/2016 0317   HGB 12.0 (L) 05/14/2016 0317   HCT 35.5 (L) 05/14/2016 0317   PLT 178 05/14/2016 0317    BMET    Component Value Date/Time   NA 138 05/14/2016 0317   K 3.8 05/14/2016 0317   CL 103 05/14/2016 0317   CO2 28 05/14/2016 0317   GLUCOSE 116 (H) 05/14/2016 0317   BUN 20 05/14/2016 0317   CREATININE 1.53 (H) 05/14/2016 0317   CREATININE 1.46 (H) 02/27/2016 0820   CALCIUM 9.1 05/14/2016 0317   GFRNONAA 42 (L) 05/14/2016 0317   GFRAA 49 (L) 05/14/2016 0317    COAG Lab Results  Component Value Date   INR 1.07 05/07/2016   INR 0.97 02/19/2016   INR 1.1 02/13/2016   No results found for: PTT  Antibiotics Anti-infectives    Start     Dose/Rate Route Frequency Ordered Stop   05/13/16 2000  cefUROXime (ZINACEF) 1.5 g in dextrose 5 % 50 mL IVPB  Status:  Discontinued     1.5 g 100 mL/hr over 30 Minutes Intravenous Every 12 hours 05/13/16 1217 05/13/16 1314   05/13/16 1500   cefUROXime (ZINACEF) 1.5 g in dextrose 5 % 50 mL IVPB     1.5 g 100 mL/hr over 30 Minutes Intravenous Every 6 hours 05/13/16 1314 05/13/16 2153   05/13/16 0533  cefUROXime (ZINACEF) 1.5 g in dextrose 5 % 50 mL IVPB     1.5 g 100 mL/hr over 30 Minutes Intravenous 30 min pre-op 05/13/16 0533 05/13/16 [place], PA-C [facility] Office: [phone] Pager: [phone] 05/14/2016 7:30 AM   Agree with the above D/c home  [place]

## 2016-05-14 NOTE — Progress Notes (Signed)
PT Cancellation Note  Patient Details Name: Arthur [name] MRN: [id number] DOB: Apr 10, 1941   Cancelled Treatment:    Reason Eval/Treat Not Completed: PT screened, no needs identified, will sign off Per RN, [name], pt does not require skilled therapy services as pt ambulating around unit and being discharged now. Thanks for your consult. Please re consult if any changes arise.    [name] A [name] 05/14/2016, 10:10 AM Wray Kearns, PT, DPT [phone]

## 2016-05-14 NOTE — Telephone Encounter (Signed)
Sched appt 11/15 at 11:15. Spoke to pt's daughter to inform pt of appt.

## 2016-05-14 NOTE — Care Management Note (Signed)
Case Management Note  Patient Details  Name: Albaro Deviney MRN: [id number] Date of Birth: July 21, 1940  Subjective/Objective:  S/p Right iliofemoral endarterectomy , ambulatory, for dc today,no needs.                  Action/Plan:   Expected Discharge Date:                  Expected Discharge Plan:  Home/Self Care  In-House Referral:     Discharge planning Services  CM Consult  Post Acute Care Choice:    Choice offered to:     DME Arranged:    DME Agency:     HH Arranged:    HH Agency:     Status of Service:  Completed, signed off  If discussed at [employer] of Stay Meetings, dates discussed:    Additional Comments:  Zenon Mayo, RN 05/14/2016, 10:12 AM

## 2016-05-14 NOTE — Progress Notes (Signed)
DC instructions given to patient and visitor. Rx given to patient. PIV DC, hemostasis achieved. Groin site level 1, bruising noted, soft, no hematoma present. VSS. eICU and CCMD notified of DC. Pt ambulated in hall this AM. Educated on medication regimen and follow up appts. Pt escorted by volunteer via wheelchair to private vehicle driven by visitor.

## 2016-05-14 NOTE — Discharge Summary (Signed)
[facility] Discharge Summary  Arthur [name] 1941/03/07 75 y.o. male  [id number]  Admission Date: 05/13/2016  Discharge Date: 05/14/2016  Physician: Serafina Mitchell, MD  Admission Diagnosis: Peripheral vascular disease with right lower extremity claudication I70.211  HPI:   This is a 75 y.o. male who was referred today for claudication.  The patient states that he has difficulty with his right leg when walking several 100 feet.  He underwent angiography which showed a focal calcific plaque at the right femoral bifurcation.  He does not have any ulcers.  The patient has a history of coronary artery disease, status post PCI he continues to smoke.  He suffers from hypercholesterolemia which is managed with a statin.  The patient is a diabetic, treated with insulin.  Hospital Course:  The patient was admitted to the hospital and taken to the operating room on 05/13/2016 and underwent: Right iliofemoral endarterectomy with bovine pericardial patch angioplasty    The patient tolerated the procedure well and was transported to the PACU in stable condition.   The patient was doing well post-operatively. His incision was healing well with a palpable PT pulse. He was ambulating well without difficulty. His pain was well controlled. He was discharged home on POD 1 in good condition.     CBC    Component Value Date/Time   WBC 8.8 05/14/2016 0317   RBC 3.92 (L) 05/14/2016 0317   HGB 12.0 (L) 05/14/2016 0317   HCT 35.5 (L) 05/14/2016 0317   PLT 178 05/14/2016 0317   MCV 90.6 05/14/2016 0317   MCH 30.6 05/14/2016 0317   MCHC 33.8 05/14/2016 0317   RDW 13.9 05/14/2016 0317   LYMPHSABS 2,240 02/13/2016 1005   MONOABS 560 02/13/2016 1005   EOSABS 80 02/13/2016 1005   BASOSABS 80 02/13/2016 1005    BMET    Component Value Date/Time   NA 138 05/14/2016 0317   K 3.8 05/14/2016 0317   CL 103 05/14/2016 0317   CO2 28 05/14/2016 0317   GLUCOSE 116 (H) 05/14/2016 0317    BUN 20 05/14/2016 0317   CREATININE 1.53 (H) 05/14/2016 0317   CREATININE 1.46 (H) 02/27/2016 0820   CALCIUM 9.1 05/14/2016 0317   GFRNONAA 42 (L) 05/14/2016 0317   GFRAA 49 (L) 05/14/2016 0317     Discharge Instructions:   The patient is discharged to home with extensive instructions on wound care and progressive ambulation.  They are instructed not to drive or perform any heavy lifting until returning to see the physician in his office.  Discharge Instructions    Call MD for:  redness, tenderness, or signs of infection (pain, swelling, bleeding, redness, odor or green/yellow discharge around incision site)    Complete by:  As directed    Call MD for:  severe or increased pain, loss or decreased feeling  in affected limb(s)    Complete by:  As directed    Call MD for:  temperature >100.5    Complete by:  As directed    Discharge wound care:    Complete by:  As directed    Wash the groin wound with soap and water daily and pat dry. (No tub bath-only shower)  Then put a dry gauze or washcloth there to keep this area dry daily and as needed.  Do not use Vaseline or neosporin on your incisions.  Only use soap and water on your incisions and then protect and keep dry.   Driving Restrictions    Complete  by:  As directed    No driving for 1 week and while on pain medication   Lifting restrictions    Complete by:  As directed    No lifting for 2 weeks   Resume previous diet    Complete by:  As directed       Discharge Diagnosis:  Peripheral vascular disease with right lower extremity claudication I70.211  Secondary Diagnosis: Patient Active Problem List   Diagnosis Date Noted  . Atherosclerosis of native arteries of extremities with intermittent claudication, right leg ([place]) 05/13/2016  . PAD (peripheral artery disease) ([place]) 02/19/2016  . Coronary artery disease 09/20/2013  . Essential hypertension 09/20/2013  . Hyperlipidemia 09/20/2013  . Insulin dependent diabetes mellitus  ([place]) 09/20/2013  . Claudication ([place]) 09/20/2013   Past Medical History:  Diagnosis Date  . Arthritis   . Chronic kidney disease   . Claudication ([place])   . Coronary artery disease 1997   CFX PCI, '97. Mod residual RI and RCA dis.  . Diabetes mellitus ([place])    IDDM  . Dyslipidemia   . Hard of hearing   . History of bronchitis   . History of kidney stones 1958  . HTN (hypertension)   . PAD (peripheral artery disease) ([place])   . Pneumonia   . Tobacco abuse   . Urinary incontinence   . Wears glasses        Medication List    TAKE these medications   aspirin 325 MG EC tablet Take 325 mg by mouth daily.   atorvastatin 20 MG tablet Commonly known as:  LIPITOR TAKE 1 TABLET (20 MG TOTAL) BY MOUTH DAILY.   CENTRUM SILVER PO Take 1 tablet by mouth daily.   cholecalciferol 1000 units tablet Commonly known as:  VITAMIN D Take 1,000 Units by mouth daily.   doxazosin 1 MG tablet Commonly known as:  CARDURA TAKE 2 TABLETS AT BEDTIME FOR BLOOD PRESSURE AND URINE FLOW   fenofibrate 160 MG tablet Take 1 tablet (160 mg total) by mouth daily.   fish oil-omega-3 fatty acids 1000 MG capsule Take 2 g by mouth 2 (two) times daily.   insulin detemir 100 UNIT/ML injection Commonly known as:  LEVEMIR Inject 30 Units into the skin 2 (two) times daily.   metoprolol 50 MG tablet Commonly known as:  LOPRESSOR Take 1 tablet (50 mg total) by mouth 2 (two) times daily.   OSTEO BI-FLEX TRIPLE STRENGTH Tabs Take 1 tablet by mouth 2 (two) times daily.   oxyCODONE-acetaminophen 5-325 MG tablet Commonly known as:  PERCOCET/ROXICET Take 1-2 tablets by mouth every 4 (four) hours as needed for moderate pain.       Percocet #30 No Refill  Disposition: Home  Patient's condition: is Good  Follow up: 1. Dr. Trula Slade in 2 weeks   Virgina Jock, PA-C [facility] [phone] 05/14/2016  7:56 AM  - For VQI Registry use --- Instructions: Press F2 to tab through  selections.  Delete question if not applicable.   Post-op:  Wound infection: No  Graft infection: No  Transfusion: No   New Arrhythmia: No Ipsilateral amputation: No, '[ ]'$  Minor, '[ ]'$  BKA, '[ ]'$  AKA D/C Ambulatory Status: Ambulatory  Complications: MI: No, '[ ]'$  Troponin only, '[ ]'$  EKG or Clinical CHF: No Resp failure:No, '[ ]'$  Pneumonia, '[ ]'$  Ventilator Chg in renal function: No, '[ ]'$  Inc. Cr > 0.5, '[ ]'$  Temp. Dialysis, '[ ]'$  Permanent dialysis Stroke: No, '[ ]'$  Minor, '[ ]'$  Major  Return to OR: No  Reason for return to OR: '[ ]'$  Bleeding, '[ ]'$  Infection, '[ ]'$  Thrombosis, '[ ]'$  Revision  Discharge medications: Statin use:  yes ASA use:  yes Plavix use:  no Beta blocker use: yes Coumadin use: no

## 2016-05-14 NOTE — Telephone Encounter (Signed)
-----   Message from Mena Goes, RN sent at 05/14/2016  9:18 AM EDT ----- Regarding: schedule 2 weeks post op   ----- Message ----- From: Alvia Grove, PA-C Sent: 05/14/2016   7:34 AM To: Vvs Charge Pool  S/p Right iliofemoral endarterectomy with bovine pericardial patch angioplasty 05/13/16  F/u with Dr. Trula Slade in 2 weeks  Thanks Maudie Mercury

## 2016-05-29 ENCOUNTER — Encounter: Payer: Self-pay | Admitting: Surgery

## 2016-06-03 ENCOUNTER — Encounter: Payer: Self-pay | Admitting: Surgery

## 2016-06-03 ENCOUNTER — Other Ambulatory Visit: Payer: Self-pay | Admitting: Cardiovascular Disease

## 2016-06-03 ENCOUNTER — Ambulatory Visit (INDEPENDENT_AMBULATORY_CARE_PROVIDER_SITE_OTHER): Payer: Medicare Other | Admitting: Surgery

## 2016-06-03 VITALS — BP 154/70 | HR 60 | Temp 97.2°F | Ht 69.5 in | Wt 172.0 lb

## 2016-06-03 DIAGNOSIS — I70211 Atherosclerosis of native arteries of extremities with intermittent claudication, right leg: Secondary | ICD-10-CM

## 2016-06-03 NOTE — Progress Notes (Signed)
   Patient name: Arthur [name] MRN: [id number] DOB: 1941-03-05 Sex: male  REASON FOR VISIT: Post-op  HPI: Arthur [name] is a 75 y.o. male who returns today for his first postoperative visit.  On 05/13/2016 he underwent a right iliofemoral endarterectomy with bovine pericardial patch angioplasty.  This was done for severe claudication.  Intraoperative findings included a near occlusive plaque within the common femoral artery.  This was a focal cauliflower-type lesion with significant posterior plaque.  The patient states that his symptoms are essentially resolved.  He is now stating that he will occasionally get difficulty walking in his left leg.  His right leg does not bother him.  He states he has a small amount of swelling in his right leg.  He has had no drainage from his incision  Current Outpatient Prescriptions  Medication Sig Dispense Refill  . aspirin 325 MG EC tablet Take 325 mg by mouth daily.    Marland Kitchen atorvastatin (LIPITOR) 20 MG tablet TAKE 1 TABLET (20 MG TOTAL) BY MOUTH DAILY. 90 tablet 2  . cholecalciferol (VITAMIN D) 1000 UNITS tablet Take 1,000 Units by mouth daily.    Marland Kitchen doxazosin (CARDURA) 1 MG tablet TAKE 2 TABLETS AT BEDTIME FOR BLOOD PRESSURE AND URINE FLOW 180 tablet 3  . fenofibrate 160 MG tablet Take 1 tablet (160 mg total) by mouth daily. 30 tablet 1  . fish oil-omega-3 fatty acids 1000 MG capsule Take 2 g by mouth 2 (two) times daily.     . insulin detemir (LEVEMIR) 100 UNIT/ML injection Inject 30 Units into the skin 2 (two) times daily.     . metoprolol (LOPRESSOR) 50 MG tablet Take 1 tablet (50 mg total) by mouth 2 (two) times daily. 180 tablet 3  . Misc Natural Products (OSTEO BI-FLEX TRIPLE STRENGTH) TABS Take 1 tablet by mouth 2 (two) times daily.    . Multiple Vitamins-Minerals (CENTRUM SILVER PO) Take 1 tablet by mouth daily.    Marland Kitchen oxyCODONE-acetaminophen (PERCOCET/ROXICET) 5-325 MG tablet Take 1-2 tablets by mouth every 4 (four) hours as  needed for moderate pain. 15 tablet 0   No current facility-administered medications for this visit.     REVIEW OF SYSTEMS:  '[X]'$  denotes positive finding, '[ ]'$  denotes negative finding Cardiac  Comments:  Chest pain or chest pressure:    Shortness of breath upon exertion:    Short of breath when lying flat:    Irregular heart rhythm:    Constitutional    Fever or chills:      PHYSICAL EXAM: Vitals:   06/03/16 1132  BP: (!) 154/70  Pulse: 60  Temp: 97.2 F (36.2 C)  TempSrc: Oral  Weight: 172 lb (78 kg)  Height: 5' 9.5" (1.765 m)    GENERAL: The patient is a well-nourished male, in no acute distress. The vital signs are documented above. CARDIOVASCULAR: There is a regular rate and rhythm. PULMONARY: There is good air exchange bilaterally without wheezing or rales. Mild right leg edema.  Palpable posterior tibial pulse oblique femoral incision is healing nicely  MEDICAL ISSUES: Status post right iliofemoral endarterectomy with patch angioplasty.  The patient is doing very well.  His swelling should continues to improve over time.  He is getting surveillance ultrasound follow-up with Dr. Gwenlyn Found.  He will follow with me on an as-needed basis  Annamarie Major, MD [facility] [phone] Pager [phone]

## 2016-06-05 ENCOUNTER — Other Ambulatory Visit: Payer: Self-pay | Admitting: Cardiovascular Disease

## 2016-06-05 ENCOUNTER — Telehealth: Payer: Self-pay | Admitting: Cardiovascular Disease

## 2016-06-05 DIAGNOSIS — I739 Peripheral vascular disease, unspecified: Secondary | ICD-10-CM

## 2016-06-05 DIAGNOSIS — Z9889 Other specified postprocedural states: Secondary | ICD-10-CM

## 2016-06-05 NOTE — Telephone Encounter (Signed)
Informed pt that he needs to have f/u dopplers. Pt verbalized understanding. Pt has appt scheduled with Dr. Gwenlyn Found on 06/16/16 and is able to have dopplers done prior to this appt.   Will route to scheduling.

## 2016-06-05 NOTE — Telephone Encounter (Signed)
-----   Message from Lorretta Harp, MD sent at 06/03/2016  3:54 PM EST ----- Regarding: Leandro Reasoner, Mr. Audie Box needs lower extremity are chilled Doppler studies that he is post vascular surgery and then follow-up with me as an outpatient.  JJB ----- Message ----- From: Serafina Mitchell, MD Sent: 06/03/2016  11:55 AM To: Lorretta Harp, MD

## 2016-06-08 ENCOUNTER — Other Ambulatory Visit: Payer: Self-pay | Admitting: Cardiovascular Disease

## 2016-06-09 ENCOUNTER — Other Ambulatory Visit: Payer: Self-pay | Admitting: Cardiovascular Disease

## 2016-06-09 ENCOUNTER — Other Ambulatory Visit: Payer: Self-pay

## 2016-06-09 ENCOUNTER — Ambulatory Visit (HOSPITAL_COMMUNITY)
Admission: RE | Admit: 2016-06-09 | Discharge: 2016-06-09 | Disposition: A | Discharge status: Home / Self Care | Payer: Medicare Other | Source: Ambulatory Visit | Attending: Cardiovascular Disease | Admitting: Cardiovascular Disease

## 2016-06-09 DIAGNOSIS — E785 Hyperlipidemia, unspecified: Secondary | ICD-10-CM | POA: Diagnosis not present

## 2016-06-09 DIAGNOSIS — R938 Abnormal findings on diagnostic imaging of other specified body structures: Secondary | ICD-10-CM | POA: Insufficient documentation

## 2016-06-09 DIAGNOSIS — Z720 Tobacco use: Secondary | ICD-10-CM | POA: Diagnosis not present

## 2016-06-09 DIAGNOSIS — I739 Peripheral vascular disease, unspecified: Secondary | ICD-10-CM

## 2016-06-09 DIAGNOSIS — R590 Localized enlarged lymph nodes: Secondary | ICD-10-CM | POA: Diagnosis not present

## 2016-06-09 DIAGNOSIS — I708 Atherosclerosis of other arteries: Secondary | ICD-10-CM | POA: Diagnosis not present

## 2016-06-09 DIAGNOSIS — Z9889 Other specified postprocedural states: Secondary | ICD-10-CM

## 2016-06-09 DIAGNOSIS — E1151 Type 2 diabetes mellitus with diabetic peripheral angiopathy without gangrene: Secondary | ICD-10-CM | POA: Diagnosis not present

## 2016-06-09 DIAGNOSIS — I2510 Atherosclerotic heart disease of native coronary artery without angina pectoris: Secondary | ICD-10-CM | POA: Insufficient documentation

## 2016-06-09 DIAGNOSIS — I10 Essential (primary) hypertension: Secondary | ICD-10-CM | POA: Insufficient documentation

## 2016-06-16 ENCOUNTER — Encounter: Payer: Self-pay | Admitting: Cardiovascular Disease

## 2016-06-16 ENCOUNTER — Ambulatory Visit (INDEPENDENT_AMBULATORY_CARE_PROVIDER_SITE_OTHER): Payer: Medicare Other | Admitting: Cardiovascular Disease

## 2016-06-16 VITALS — BP 150/82 | HR 63 | Ht 69.5 in | Wt 175.6 lb

## 2016-06-16 DIAGNOSIS — I739 Peripheral vascular disease, unspecified: Secondary | ICD-10-CM | POA: Diagnosis not present

## 2016-06-16 DIAGNOSIS — E7800 Pure hypercholesterolemia, unspecified: Secondary | ICD-10-CM | POA: Diagnosis not present

## 2016-06-16 DIAGNOSIS — I2510 Atherosclerotic heart disease of native coronary artery without angina pectoris: Secondary | ICD-10-CM | POA: Diagnosis not present

## 2016-06-16 DIAGNOSIS — I10 Essential (primary) hypertension: Secondary | ICD-10-CM

## 2016-06-16 DIAGNOSIS — I70211 Atherosclerosis of native arteries of extremities with intermittent claudication, right leg: Secondary | ICD-10-CM

## 2016-06-16 NOTE — Progress Notes (Signed)
06/16/2016 Arthur [name]   [birth year]  [id number]  Primary Physician Salena Saner., MD Primary Cardiologist: Lorretta Harp MD FACP, [facility], [place], [place]  HPI:  .The patient is a 75 year old, mildly overweight, married Caucasian male, father of 2, grandfather to 25 grandchildren who I saw 03/11/16. He has a history of CAD status post circumflex intervention by myself May 20, 1996. He also had a 60% ostial ramus branch stenosis, a 70% distal RCA stenosis and normal LV function. His last functional study performed August 15, 2010, was nonischemic. He does continue to smoke 3 cigarettes a day. His other problems include hypertension, hyperlipidemia and noninsulin-requiring diabetes. Dr. Karlton Lemon follows his lipid profile. . I obtained lower extremity arterial Doppler studies on 10/15/14 which revealed ABIs showing a right ABI of 1.86. There was a high-frequency signal in his distal left SFA. He now complains of right lower extremity claudication. He denies chest pain or shortness of breath. Dopplers performed 01/28/16 revealed a decline in his right ABI from one down to 0.56 with a new high-frequency signal in his right common femoral artery. Because of an elevated creatinine the patient was admitted yesterday for hydration. His serum creatinine did improve. He underwent lower extremity angiography by myself 02/19/16 revealing a 99% calcified exophytic right common femoral artery stenosis, 75% segmental calcified mid right SFA stenosis with 2 vessel runoff. He underwent right iliofemoral endarterectomy with bovine pericardial patch angioplasty by Dr. Trula Slade 05/13/16 with an excellent clinical result. He no longer has claudication on that side. His follow-up Dopplers performed 06/09/16 revealed normalization of his right ABI 1.0. His right common femoral velocities have normalized as well. He now complains of some left lower extremity claudication.  Current Outpatient Prescriptions  Medication Sig  Dispense Refill  . aspirin 325 MG EC tablet Take 325 mg by mouth daily.    Marland Kitchen atorvastatin (LIPITOR) 20 MG tablet TAKE 1 TABLET (20 MG TOTAL) BY MOUTH DAILY. 90 tablet 2  . cholecalciferol (VITAMIN D) 1000 UNITS tablet Take 1,000 Units by mouth daily.    Marland Kitchen doxazosin (CARDURA) 1 MG tablet TAKE 2 TABLETS AT BEDTIME FOR BLOOD PRESSURE AND URINE FLOW 180 tablet 3  . fenofibrate 160 MG tablet Take 1 tablet (160 mg total) by mouth daily. 30 tablet 1  . fenofibrate 160 MG tablet TAKE 1 TABLET (160 MG TOTAL) BY MOUTH DAILY. MUST KEEP APPT. 30 tablet 5  . fish oil-omega-3 fatty acids 1000 MG capsule Take 2 g by mouth 2 (two) times daily.     . insulin detemir (LEVEMIR) 100 UNIT/ML injection Inject 30 Units into the skin 2 (two) times daily.     . metoprolol (LOPRESSOR) 50 MG tablet Take 1 tablet (50 mg total) by mouth 2 (two) times daily. 180 tablet 3  . Misc Natural Products (OSTEO BI-FLEX TRIPLE STRENGTH) TABS Take 1 tablet by mouth 2 (two) times daily.    . Multiple Vitamins-Minerals (CENTRUM SILVER PO) Take 1 tablet by mouth daily.    Marland Kitchen oxyCODONE-acetaminophen (PERCOCET/ROXICET) 5-325 MG tablet Take 1-2 tablets by mouth every 4 (four) hours as needed for moderate pain. 15 tablet 0   No current facility-administered medications for this visit.     Allergies  Allergen Reactions  . Codeine Swelling    Social History   Social History  . Marital status: Married    Spouse name: N/A  . Number of children: N/A  . Years of education: N/A   Occupational History  . Not on file.  Social History Main Topics  . Smoking status: Former Smoker    Packs/day: 0.25    Years: 65.00    Types: Cigarettes  . Smokeless tobacco: Never Used     Comment: 05/13/2016 "stopped smoking ~ 1 month ago"  . Alcohol use No  . Drug use: No  . Sexual activity: Not on file   Other Topics Concern  . Not on file   Social History Narrative   Retired from [employer]. Married 2 children, 11 grand children, 6 great grand  children     Review of Systems: General: negative for chills, fever, night sweats or weight changes.  Cardiovascular: negative for chest pain, dyspnea on exertion, edema, orthopnea, palpitations, paroxysmal nocturnal dyspnea or shortness of breath Dermatological: negative for rash Respiratory: negative for cough or wheezing Urologic: negative for hematuria Abdominal: negative for nausea, vomiting, diarrhea, bright red blood per rectum, melena, or hematemesis Neurologic: negative for visual changes, syncope, or dizziness All other systems reviewed and are otherwise negative except as noted above.    Blood pressure (!) 150/82, pulse 63, height 5' 9.5" (1.765 m), weight 175 lb 9.6 oz (79.7 kg).  General appearance: alert and no distress Neck: no adenopathy, no carotid bruit, no JVD, supple, symmetrical, trachea midline and thyroid not enlarged, symmetric, no tenderness/mass/nodules Lungs: clear to auscultation bilaterally Heart: regular rate and rhythm, S1, S2 normal, no murmur, click, rub or gallop Extremities: extremities normal, atraumatic, no cyanosis or edema and Healed right common femoral endarterectomy surgical wound  EKG not performed today  ASSESSMENT AND PLAN:   Coronary artery disease History of coronary artery disease status post circumflex intervention by myself 05/20/96. He also had a 60% ostial ramus branch stenosis, 70% distal RCA stenosis and normal LV function.Marland Kitchen His last functional study performed 08/15/10 was nonischemic. He denies chest pain or shortness of breath.  Essential hypertension History of hypertension with blood pressure measured today at 150/82. He is on metoprolol. Continue current meds at current dosing  Hyperlipidemia History of hyperlipidemia on atorvastatin. 5. Recent lipid profile performed 12/30/68. Cholesterol 136, LDL 71 and HDL of 33  Atherosclerosis of native arteries of extremities with intermittent claudication, right leg (HCC) History of  right greater than left lower extremity claudication with Dopplers performed 01/28/16 revealing a right ABI 0.56 with a high-frequency signal in the right common femoral artery. His left ABI was 0.8 to. I performed angiography on 02/19/16 revealing a 90% calcified eccentric plaque in the right common femoral artery, 75% segmental calcified mid right SFA stenosis with 2 vessel runoff. His anterior tibial was occluded. He underwent right iliofemoral endarterectomy with patch angioplasty by Dr. Trula Slade 05/13/16 with an excellent surgical and clinical result. His follow-up Dopplers performed 06/09/16 revealed an increase in his right ABI to 1.0. His right common femoral frequencies have normalized his claudication has resolved. He is on aspirin. He now complains of some left lower extremity claudication. We will get follow-up lower extremity arterial Doppler studies in 6 months after which I will see him back in follow-up.      Lorretta Harp MD FACP,FACC,FAHA, [facility] 06/16/2016 8:34 AM

## 2016-06-16 NOTE — Assessment & Plan Note (Signed)
History of hyperlipidemia on atorvastatin. 5. Recent lipid profile performed 12/30/68. Cholesterol 136, LDL 71 and HDL of 33

## 2016-06-16 NOTE — Assessment & Plan Note (Signed)
History of hypertension with blood pressure measured today at 150/82. He is on metoprolol. Continue current meds at current dosing

## 2016-06-16 NOTE — Patient Instructions (Signed)
Medication Instructions: Your physician recommends that you continue on your current medications as directed. Please refer to the Current Medication list given to you today.  Testing:  Your physician has requested that you have a lower extremity arterial duplex in 6 months. During this test, ultrasound is used to evaluate arterial blood flow in the legs. Allow one hour for this exam. There are no restrictions or special instructions.  Your physician has requested that you have an ankle brachial index (ABI) in 6 months. During this test an ultrasound and blood pressure cuff are used to evaluate the arteries that supply the arms and legs with blood. Allow thirty minutes for this exam. There are no restrictions or special instructions.   Follow-Up: Your physician wants you to follow-up in: 6 months with Dr. Gwenlyn Found after dopplers. You will receive a reminder letter in the mail two months in advance. If you don't receive a letter, please call our office to schedule the follow-up appointment.  If you need a refill on your cardiac medications before your next appointment, please call your pharmacy.

## 2016-06-16 NOTE — Assessment & Plan Note (Signed)
History of coronary artery disease status post circumflex intervention by myself 05/20/96. He also had a 60% ostial ramus branch stenosis, 70% distal RCA stenosis and normal LV function.Marland Kitchen His last functional study performed 08/15/10 was nonischemic. He denies chest pain or shortness of breath.

## 2016-06-16 NOTE — Assessment & Plan Note (Signed)
History of right greater than left lower extremity claudication with Dopplers performed 01/28/16 revealing a right ABI 0.56 with a high-frequency signal in the right common femoral artery. His left ABI was 0.8 to. I performed angiography on 02/19/16 revealing a 90% calcified eccentric plaque in the right common femoral artery, 75% segmental calcified mid right SFA stenosis with 2 vessel runoff. His anterior tibial was occluded. He underwent right iliofemoral endarterectomy with patch angioplasty by Dr. Trula Slade 05/13/16 with an excellent surgical and clinical result. His follow-up Dopplers performed 06/09/16 revealed an increase in his right ABI to 1.0. His right common femoral frequencies have normalized his claudication has resolved. He is on aspirin. He now complains of some left lower extremity claudication. We will get follow-up lower extremity arterial Doppler studies in 6 months after which I will see him back in follow-up.

## 2016-07-03 ENCOUNTER — Encounter (HOSPITAL_COMMUNITY): Payer: Medicare Other

## 2016-07-17 DIAGNOSIS — Z0000 Encounter for general adult medical examination without abnormal findings: Secondary | ICD-10-CM | POA: Diagnosis not present

## 2016-07-17 DIAGNOSIS — N183 Chronic kidney disease, stage 3 (moderate): Secondary | ICD-10-CM | POA: Diagnosis not present

## 2016-07-17 DIAGNOSIS — N08 Glomerular disorders in diseases classified elsewhere: Secondary | ICD-10-CM | POA: Diagnosis not present

## 2016-07-17 DIAGNOSIS — I129 Hypertensive chronic kidney disease with stage 1 through stage 4 chronic kidney disease, or unspecified chronic kidney disease: Secondary | ICD-10-CM | POA: Diagnosis not present

## 2016-07-17 DIAGNOSIS — E1122 Type 2 diabetes mellitus with diabetic chronic kidney disease: Secondary | ICD-10-CM | POA: Diagnosis not present

## 2016-08-04 ENCOUNTER — Other Ambulatory Visit: Payer: Self-pay | Admitting: Cardiovascular Disease

## 2016-08-04 NOTE — Telephone Encounter (Signed)
Rx(s) sent to pharmacy electronically.  

## 2016-08-11 ENCOUNTER — Ambulatory Visit: Payer: Medicare Other | Admitting: Podiatry

## 2016-09-30 DIAGNOSIS — N08 Glomerular disorders in diseases classified elsewhere: Secondary | ICD-10-CM | POA: Diagnosis not present

## 2016-09-30 DIAGNOSIS — E782 Mixed hyperlipidemia: Secondary | ICD-10-CM | POA: Diagnosis not present

## 2016-09-30 DIAGNOSIS — N400 Enlarged prostate without lower urinary tract symptoms: Secondary | ICD-10-CM | POA: Diagnosis not present

## 2016-09-30 DIAGNOSIS — E1122 Type 2 diabetes mellitus with diabetic chronic kidney disease: Secondary | ICD-10-CM | POA: Diagnosis not present

## 2016-09-30 DIAGNOSIS — N183 Chronic kidney disease, stage 3 (moderate): Secondary | ICD-10-CM | POA: Diagnosis not present

## 2016-11-18 DIAGNOSIS — N2581 Secondary hyperparathyroidism of renal origin: Secondary | ICD-10-CM | POA: Diagnosis not present

## 2016-11-18 DIAGNOSIS — D631 Anemia in chronic kidney disease: Secondary | ICD-10-CM | POA: Diagnosis not present

## 2016-11-18 DIAGNOSIS — N183 Chronic kidney disease, stage 3 (moderate): Secondary | ICD-10-CM | POA: Diagnosis not present

## 2016-11-26 DIAGNOSIS — D509 Iron deficiency anemia, unspecified: Secondary | ICD-10-CM | POA: Diagnosis not present

## 2016-11-26 DIAGNOSIS — N183 Chronic kidney disease, stage 3 (moderate): Secondary | ICD-10-CM | POA: Diagnosis not present

## 2016-11-26 DIAGNOSIS — I129 Hypertensive chronic kidney disease with stage 1 through stage 4 chronic kidney disease, or unspecified chronic kidney disease: Secondary | ICD-10-CM | POA: Diagnosis not present

## 2016-11-26 DIAGNOSIS — E1121 Type 2 diabetes mellitus with diabetic nephropathy: Secondary | ICD-10-CM | POA: Diagnosis not present

## 2016-11-26 DIAGNOSIS — N2581 Secondary hyperparathyroidism of renal origin: Secondary | ICD-10-CM | POA: Diagnosis not present

## 2016-12-02 ENCOUNTER — Telehealth: Payer: Self-pay | Admitting: Cardiovascular Disease

## 2016-12-02 NOTE — Telephone Encounter (Signed)
Received records from [facility] for appointment on 12/16/16 with Dr Gwenlyn Found.  Records put with Dr Kennon Holter schedule for 12/16/16. lp

## 2016-12-16 ENCOUNTER — Encounter: Payer: Self-pay | Admitting: Cardiovascular Disease

## 2016-12-16 ENCOUNTER — Ambulatory Visit (INDEPENDENT_AMBULATORY_CARE_PROVIDER_SITE_OTHER): Payer: Medicare Other | Admitting: Cardiovascular Disease

## 2016-12-16 VITALS — BP 154/70 | HR 65 | Ht 70.0 in | Wt 186.6 lb

## 2016-12-16 DIAGNOSIS — E7800 Pure hypercholesterolemia, unspecified: Secondary | ICD-10-CM | POA: Diagnosis not present

## 2016-12-16 DIAGNOSIS — I2510 Atherosclerotic heart disease of native coronary artery without angina pectoris: Secondary | ICD-10-CM | POA: Diagnosis not present

## 2016-12-16 DIAGNOSIS — I70211 Atherosclerosis of native arteries of extremities with intermittent claudication, right leg: Secondary | ICD-10-CM

## 2016-12-16 DIAGNOSIS — I10 Essential (primary) hypertension: Secondary | ICD-10-CM | POA: Diagnosis not present

## 2016-12-16 DIAGNOSIS — I739 Peripheral vascular disease, unspecified: Secondary | ICD-10-CM

## 2016-12-16 NOTE — Assessment & Plan Note (Signed)
History of essential hypertension blood pressure measured 154/70. He is on metoprolol. Continue current meds at current dosing

## 2016-12-16 NOTE — Assessment & Plan Note (Signed)
History of peripheral arterial disease status post angiography by myself 02/19/16 revealing a 99% calcified exophytic plaque in the right common femoral artery. He had a 75%, 5 plaque in the mid right SFA with two-vessel runoff. The anterior tibial was occluded. He underwent right common femoral endarterectomy with patch angioplasty by Dr. Trula Slade 05/13/16 with an excellent angiographic and clinical result. Follow-up Dopplers performed 06/09/16 revealed an increase in his right ABI from 0.56 up to 1.0. His claudication has resolved.

## 2016-12-16 NOTE — Progress Notes (Signed)
12/16/2016 Arthur [name]   1941/05/03  [id number]  Primary Physician [name], MD Primary Cardiologist: Lorretta Harp MD FACP, [facility], [place], [place]  HPI:  .The patient is a 76 year old, mildly overweight, married Caucasian male, father of 2, grandfather to 5 grandchildren who I saw 06/16/16. He has a history of CAD status post circumflex intervention by myself May 20, 1996. He also had a 60% ostial ramus branch stenosis, a 70% distal RCA stenosis and normal LV function. His last functional study performed August 15, 2010, was nonischemic. He does continue to smoke 3 cigarettes a day. His other problems include hypertension, hyperlipidemia and noninsulin-requiring diabetes. Dr. Karlton Lemon follows his lipid profile. . I obtained lower extremity arterial Doppler studies on 10/15/14 which revealed ABIs showing a right ABI of 1.86. There was a high-frequency signal in his distal left SFA. He now complains of right lower extremity claudication. He denies chest pain or shortness of breath. Dopplers performed 01/28/16 revealed a decline in his right ABI from one down to 0.56 with a new high-frequency signal in his right common femoral artery. Because of an elevated creatinine the patient was admitted yesterday for hydration. His serum creatinine did improve. He underwent lower extremity angiography by myself 02/19/16 revealing a 99% calcified exophytic right common femoral artery stenosis, 75% segmental calcified mid right SFA stenosis with 2 vessel runoff. He underwent right iliofemoral endarterectomy with bovine pericardial patch angioplasty by Dr. Trula Slade 05/13/16 with an excellent clinical result. He no longer has claudication on that side. His follow-up Dopplers performed 06/09/16 revealed normalization of his right ABI 1.0. His right common femoral velocities have normalized as well. He Says that his left leg actually improved as well after his right common femoral endarterectomy. He denies chest pain or  shortness of breath.   Current Outpatient Prescriptions  Medication Sig Dispense Refill  . aspirin 325 MG EC tablet Take 325 mg by mouth daily.    Marland Kitchen atorvastatin (LIPITOR) 20 MG tablet TAKE 1 TABLET (20 MG TOTAL) BY MOUTH DAILY. 90 tablet 2  . cholecalciferol (VITAMIN D) 1000 UNITS tablet Take 1,000 Units by mouth daily.    Marland Kitchen doxazosin (CARDURA) 1 MG tablet Take by mouth 3 (three) times daily. Pt takes 3 at bedtime    . fenofibrate 160 MG tablet TAKE 1 TABLET (160 MG TOTAL) BY MOUTH DAILY. MUST KEEP APPT. 30 tablet 5  . fish oil-omega-3 fatty acids 1000 MG capsule Take 2 g by mouth 2 (two) times daily.     . insulin lispro (HUMALOG) 100 UNIT/ML injection Inject 40 Units into the skin 2 (two) times daily.    . metoprolol (LOPRESSOR) 50 MG tablet Take 1 tablet (50 mg total) by mouth 2 (two) times daily. 180 tablet 3  . Misc Natural Products (OSTEO BI-FLEX TRIPLE STRENGTH) TABS Take 1 tablet by mouth 2 (two) times daily.    . Multiple Vitamins-Minerals (CENTRUM SILVER PO) Take 1 tablet by mouth daily.     No current facility-administered medications for this visit.     Allergies  Allergen Reactions  . Codeine Swelling    Social History   Social History  . Marital status: Married    Spouse name: N/A  . Number of children: N/A  . Years of education: N/A   Occupational History  . Not on file.   Social History Main Topics  . Smoking status: Former Smoker    Packs/day: 0.25    Years: 65.00    Types: Cigarettes  .  Smokeless tobacco: Never Used     Comment: 05/13/2016 "stopped smoking ~ 1 month ago"  . Alcohol use No  . Drug use: No  . Sexual activity: Not on file   Other Topics Concern  . Not on file   Social History Narrative   Retired from [employer]. Married 2 children, 11 grand children, 6 great grand children     Review of Systems: General: negative for chills, fever, night sweats or weight changes.  Cardiovascular: negative for chest pain, dyspnea on exertion, edema,  orthopnea, palpitations, paroxysmal nocturnal dyspnea or shortness of breath Dermatological: negative for rash Respiratory: negative for cough or wheezing Urologic: negative for hematuria Abdominal: negative for nausea, vomiting, diarrhea, bright red blood per rectum, melena, or hematemesis Neurologic: negative for visual changes, syncope, or dizziness All other systems reviewed and are otherwise negative except as noted above.    Blood pressure (!) 154/70, pulse 65, height 5\' 10"  (1.778 m), weight 186 lb 9.6 oz (84.6 kg).  General appearance: alert and no distress Neck: no adenopathy, no carotid bruit, no JVD, supple, symmetrical, trachea midline and thyroid not enlarged, symmetric, no tenderness/mass/nodules Lungs: clear to auscultation bilaterally Heart: regular rate and rhythm, S1, S2 normal, no murmur, click, rub or gallop Extremities: extremities normal, atraumatic, no cyanosis or edema  EKG Normal sinus rhythm at 65without ST or T-wave changes. I personally reviewed this EKG  ASSESSMENT AND PLAN:   Coronary artery disease History of CAD status post circumflex intervention by myself 05/20/96. He has 60% ostial ramus branch stenosis, 70% distal RCA stenosis and normal LV function. A functional study performed 08/15/10 was nonischemic.He denies chest pain or shortness of breath.  Essential hypertension History of essential hypertension blood pressure measured 154/70. He is on metoprolol. Continue current meds at current dosing  Hyperlipidemia History of hyperlipidemia on statin therapy followed by his PCP  Atherosclerosis of native arteries of extremities with intermittent claudication, right leg ([place]) History of peripheral arterial disease status post angiography by myself 02/19/16 revealing a 99% calcified exophytic plaque in the right common femoral artery. He had a 75%, 5 plaque in the mid right SFA with two-vessel runoff. The anterior tibial was occluded. He underwent right common  femoral endarterectomy with patch angioplasty by Dr. Trula Slade 05/13/16 with an excellent angiographic and clinical result. Follow-up Dopplers performed 06/09/16 revealed an increase in his right ABI from 0.56 up to 1.0. His claudication has resolved.      Lorretta Harp MD FACP,FACC,FAHA, [facility] 12/16/2016 9:05 AM

## 2016-12-16 NOTE — Patient Instructions (Signed)
Medication Instructions: Your physician recommends that you continue on your current medications as directed. Please refer to the Current Medication list given to you today.   Testing/Procedures: Your physician has requested that you have a lower extremity arterial duplex. During this test, ultrasound is used to evaluate arterial blood flow in the legs. Allow one hour for this exam. There are no restrictions or special instructions.  Your physician has requested that you have an ankle brachial index (ABI). During this test an ultrasound and blood pressure cuff are used to evaluate the arteries that supply the arms and legs with blood. Allow thirty minutes for this exam. There are no restrictions or special instructions.  (Schedule for November 2018)  Follow-Up: Your physician wants you to follow-up in: 1 year with Dr. Gwenlyn Found. You will receive a reminder letter in the mail two months in advance. If you don't receive a letter, please call our office to schedule the follow-up appointment.  If you need a refill on your cardiac medications before your next appointment, please call your pharmacy.

## 2016-12-16 NOTE — Assessment & Plan Note (Signed)
History of hyperlipidemia on statin therapy followed by his PCP 

## 2016-12-16 NOTE — Assessment & Plan Note (Signed)
History of CAD status post circumflex intervention by myself 05/20/96. He has 60% ostial ramus branch stenosis, 70% distal RCA stenosis and normal LV function. A functional study performed 08/15/10 was nonischemic.He denies chest pain or shortness of breath.

## 2016-12-31 DIAGNOSIS — E1122 Type 2 diabetes mellitus with diabetic chronic kidney disease: Secondary | ICD-10-CM | POA: Diagnosis not present

## 2016-12-31 DIAGNOSIS — R531 Weakness: Secondary | ICD-10-CM | POA: Diagnosis not present

## 2016-12-31 DIAGNOSIS — E782 Mixed hyperlipidemia: Secondary | ICD-10-CM | POA: Diagnosis not present

## 2016-12-31 DIAGNOSIS — I129 Hypertensive chronic kidney disease with stage 1 through stage 4 chronic kidney disease, or unspecified chronic kidney disease: Secondary | ICD-10-CM | POA: Diagnosis not present

## 2017-01-05 ENCOUNTER — Other Ambulatory Visit: Payer: Self-pay | Admitting: Nurse Practitioner

## 2017-01-05 DIAGNOSIS — R531 Weakness: Secondary | ICD-10-CM

## 2017-01-07 ENCOUNTER — Ambulatory Visit
Admission: RE | Admit: 2017-01-07 | Discharge: 2017-01-07 | Disposition: A | Discharge status: Home / Self Care | Payer: Medicare Other | Source: Ambulatory Visit | Attending: Nurse Practitioner | Admitting: Nurse Practitioner

## 2017-01-07 DIAGNOSIS — R42 Dizziness and giddiness: Secondary | ICD-10-CM | POA: Diagnosis not present

## 2017-01-07 DIAGNOSIS — R531 Weakness: Secondary | ICD-10-CM

## 2017-01-27 ENCOUNTER — Ambulatory Visit (INDEPENDENT_AMBULATORY_CARE_PROVIDER_SITE_OTHER): Payer: Medicare Other | Admitting: Neurology

## 2017-01-27 ENCOUNTER — Encounter: Payer: Self-pay | Admitting: Neurology

## 2017-01-27 VITALS — BP 157/81 | HR 71 | Resp 18 | Ht 70.0 in | Wt 182.5 lb

## 2017-01-27 DIAGNOSIS — I70211 Atherosclerosis of native arteries of extremities with intermittent claudication, right leg: Secondary | ICD-10-CM | POA: Diagnosis not present

## 2017-01-27 DIAGNOSIS — G4733 Obstructive sleep apnea (adult) (pediatric): Secondary | ICD-10-CM

## 2017-01-27 DIAGNOSIS — E119 Type 2 diabetes mellitus without complications: Secondary | ICD-10-CM

## 2017-01-27 DIAGNOSIS — Z720 Tobacco use: Secondary | ICD-10-CM

## 2017-01-27 DIAGNOSIS — I10 Essential (primary) hypertension: Secondary | ICD-10-CM | POA: Diagnosis not present

## 2017-01-27 DIAGNOSIS — E7800 Pure hypercholesterolemia, unspecified: Secondary | ICD-10-CM | POA: Diagnosis not present

## 2017-01-27 DIAGNOSIS — I6381 Other cerebral infarction due to occlusion or stenosis of small artery: Secondary | ICD-10-CM | POA: Insufficient documentation

## 2017-01-27 DIAGNOSIS — Z794 Long term (current) use of insulin: Secondary | ICD-10-CM

## 2017-01-27 DIAGNOSIS — I2510 Atherosclerotic heart disease of native coronary artery without angina pectoris: Secondary | ICD-10-CM

## 2017-01-27 DIAGNOSIS — R29898 Other symptoms and signs involving the musculoskeletal system: Secondary | ICD-10-CM | POA: Diagnosis not present

## 2017-01-27 DIAGNOSIS — IMO0001 Reserved for inherently not codable concepts without codable children: Secondary | ICD-10-CM

## 2017-01-27 DIAGNOSIS — I639 Cerebral infarction, unspecified: Secondary | ICD-10-CM | POA: Diagnosis not present

## 2017-01-27 NOTE — Progress Notes (Signed)
x

## 2017-01-27 NOTE — Progress Notes (Signed)
[facility]  PATIENT: Arthur [name] DOB: Dec 12, 1940  REFERRING DOCTOR OR PCP:  Minette Brine, FNP SOURCE: Patient, family, notes from PCP, imaging/lab reports, CT scan was personally reviewed on PACS.  _________________________________   HISTORICAL  CHIEF COMPLAINT:  Chief Complaint  Patient presents with  . Abnormal CT  scan    Arthur [name] is here with his dtr. Arthur [name], and g'dtr. to discuss CT head results.  Sts. around 12/18/16, he awoke in the middle of the night to go to the restroom.  Sts. he was sweaty when he woke up, and noted new left sided weakness.  Had dizziness for about 3 days, but his has resolved now.  He saw his pcp, had CT head and is here to discuss results.  Still c/o some left sided weakness.  Dtr. sts. he occasionally drags his left foot./fim    HISTORY OF PRESENT ILLNESS:  I had the pleasure seeing you patient, Arthur [name]) Arthur [name], at [facility] for neurologic consultation regarding his mild left-sided weakness and abnormal CT scan.  In early June 2018, he woke up sweating.   When he got out of bed, he fell and he noticed the left side was weak.   He got himself back up and was able to walk but dragged the left leg.   His sugars were checked and it was fine.  The left arm was also mildly weak.    Over the next couple weeks, strength improved but he still notes minimal left sided weakness.      Of note, he was on aspirin at the time (has been on 325 mg > 25 years).    He saw primary care 12/31/2016 due to the weakness in the left arm and leg. There is no tremor reviewed. He was felt to have weakness and mild dysmetria and the CT scan and lab work was ordered. CT scan showed ischemic changes that appear to be chronic. By my review there are 3 lacunar infarctions, one in the centrum semiovale bilaterally and one in the anterior limb of the right internal capsule.   Additionally, some calcification is noted in the internal carotid arteries and  vertebral arteries  He feels he is still improving and not yet to baseline.     He denies any similar event in the past and he had never had a brain scan before last month.      He has several risk factors.   He has CAD and has had PTCA.   He has had vascular surgery for PAD.  He has poorly controlled DM and is on insulin.   He has HTN and hyperlipidemia also.     He also has mild CRF.      He snores loudly at night x years.   Although definite pauses have not been noted he sometimes jerks after a louder snore.     I personally reviewed the CT scan performed 01/07/2017. It is abnormal showing to moderate size lacunar instructions in the centrum semiovale ovale above the basal ganglia and one tiny lacunar infarction in the anterior limb of the right internal capsule. Additionally, there are hyperdense signal changes in the periventricular and deep white matter consistent with chronic microvascular ischemic change. Brain volume is slightly reduced.  Blood work was reviewed. The hemoglobin A1c was elevated at 7.7. HDL is reduced at 35 and triglycerides were minimally elevated at 157. GFR was slightly reduced at 50 and glucose was mildly elevated. TSH was normal.  REVIEW  OF SYSTEMS: Constitutional: No fevers, chills, sweats, or change in appetite Eyes: No visual changes, double vision, eye pain Ear, nose and throat: No hearing loss, ear pain, nasal congestion, sore throat Cardiovascular: History of coronary artery disease and peripheral vascular disease Respiratory: No shortness of breath at rest or with exertion.   No wheezes GastrointestinaI: No nausea, vomiting, diarrhea, abdominal pain, fecal incontinence Genitourinary: No dysuria, urinary retention or frequency.  No nocturia. Musculoskeletal: No neck pain, back pain Integumentary: No rash, pruritus, skin lesions Neurological: as above Psychiatric: No depression at this time.  No anxiety Endocrine: No palpitations, diaphoresis, change in  appetite, change in weigh or increased thirst Hematologic/Lymphatic: No anemia, purpura, petechiae.   Occasional bruising on his arms Allergic/Immunologic: No itchy/runny eyes, nasal congestion, recent allergic reactions, rashes  ALLERGIES: Allergies  Allergen Reactions  . Codeine Swelling    HOME MEDICATIONS:  Current Outpatient Prescriptions:  .  aspirin 325 MG EC tablet, Take 325 mg by mouth daily., Disp: , Rfl:  .  atorvastatin (LIPITOR) 20 MG tablet, TAKE 1 TABLET (20 MG TOTAL) BY MOUTH DAILY., Disp: 90 tablet, Rfl: 2 .  cholecalciferol (VITAMIN D) 1000 UNITS tablet, Take 1,000 Units by mouth daily., Disp: , Rfl:  .  doxazosin (CARDURA) 1 MG tablet, Take by mouth 3 (three) times daily. Pt takes 3 at bedtime, Disp: , Rfl:  .  fenofibrate 160 MG tablet, TAKE 1 TABLET (160 MG TOTAL) BY MOUTH DAILY. MUST KEEP APPT., Disp: 30 tablet, Rfl: 5 .  fish oil-omega-3 fatty acids 1000 MG capsule, Take 2 g by mouth 2 (two) times daily. , Disp: , Rfl:  .  insulin lispro (HUMALOG) 100 UNIT/ML injection, Inject 40 Units into the skin 2 (two) times daily., Disp: , Rfl:  .  metoprolol (LOPRESSOR) 50 MG tablet, Take 1 tablet (50 mg total) by mouth 2 (two) times daily., Disp: 180 tablet, Rfl: 3 .  Misc Natural Products (OSTEO BI-FLEX TRIPLE STRENGTH) TABS, Take 1 tablet by mouth 2 (two) times daily., Disp: , Rfl:  .  Multiple Vitamins-Minerals (CENTRUM SILVER PO), Take 1 tablet by mouth daily., Disp: , Rfl:  .  TRADJENTA 5 MG TABS tablet, Take 5 mg by mouth daily., Disp: , Rfl: 1  PAST MEDICAL HISTORY: Past Medical History:  Diagnosis Date  . Arthritis   . Chronic kidney disease   . Claudication ([place])   . Coronary artery disease 1997   CFX PCI, '97. Mod residual RI and RCA dis.  . Diabetes mellitus ([place])    IDDM  . Dyslipidemia   . Hard of hearing   . History of bronchitis   . History of kidney stones 1958  . HTN (hypertension)   . PAD (peripheral artery disease) ([place])   . Pneumonia   .  Tobacco abuse   . Urinary incontinence   . Wears glasses     PAST SURGICAL HISTORY: Past Surgical History:  Procedure Laterality Date  . APPENDECTOMY     "busted on me"  . COLONOSCOPY    . CORONARY ANGIOPLASTY  1997   CFX  . ENDARTERECTOMY FEMORAL Right 05/13/2016   iliofemoral endarterectomy with bovine pericardial patch angioplasty  . ENDARTERECTOMY FEMORAL Right 05/13/2016   Procedure: RIGHT ENDARTERECTOMY FEMORAL;  Surgeon: Serafina Mitchell, MD;  Location: [facility];  Service: Vascular;  Laterality: Right;  . PATCH ANGIOPLASTY Right 05/13/2016   Procedure: PATCH ANGIOPLASTY USING Rueben Bash BIOLOGIC PATCH;  Surgeon: Serafina Mitchell, MD;  Location: [facility];  Service: Vascular;  Laterality:  Right;  Marland Kitchen PERIPHERAL VASCULAR CATHETERIZATION N/A 02/20/2016   Procedure: Lower Extremity Angiography;  Surgeon: Lorretta Harp, MD;  Location: [facility];  Service: Cardiovascular;  Laterality: N/A;    FAMILY HISTORY: Family History  Problem Relation Age of Onset  . Diabetes Sister   . Arthritis Mother   . Heart disease Father     SOCIAL HISTORY:  Social History   Social History  . Marital status: Married    Spouse name: N/A  . Number of children: N/A  . Years of education: N/A   Occupational History  . Not on file.   Social History Main Topics  . Smoking status: Former Smoker    Packs/day: 0.25    Years: 65.00    Types: Cigarettes  . Smokeless tobacco: Never Used     Comment: 05/13/2016 "stopped smoking ~ 1 month ago"  . Alcohol use No  . Drug use: No  . Sexual activity: Not on file   Other Topics Concern  . Not on file   Social History Narrative   Retired from [employer]. Married 2 children, 11 grand children, 6 great grand children     PHYSICAL EXAM  Vitals:   01/27/17 0853  BP: (!) 157/81  Pulse: 71  Resp: 18  Weight: 182 lb 8 oz (82.8 kg)  Height: 5\' 10"  (1.778 m)    Body mass index is 26.19 kg/m.   General: The patient is well-developed and  well-nourished and in no acute distress  Eyes:  Funduscopic exam shows normal optic discs and retinal vessels.  Neck: The neck is supple, no carotid bruits are noted.  The neck is nontender.  Cardiovascular: The heart has a regular rate and rhythm with a normal S1 and S2. There were no murmurs, gallops or rubs. Lungs are clear to auscultation.  Skin: Extremities are without significant edema.  Musculoskeletal:  Back is nontender  Neurologic Exam  Mental status: The patient is alert and oriented x 3 at the time of the examination. The patient has apparent normal recent and remote memory, with an apparently normal attention span and concentration ability.   Speech is normal.  Cranial nerves: Extraocular movements are full. Pupils are equal, round, and reactive to light and accomodation.  Visual fields are full.  Facial symmetry is present. There is good facial sensation to soft touch bilaterally.  Facial strength is normal.  Trapezius and sternocleidomastoid strength is normal. No dysarthria is noted.  The tongue is midline, and the patient has symmetric elevation of the soft palate. Mild bilateral hearing deficits are noted.  Motor:  Muscle bulk is normal.   Tone is normal. Strength is  5 / 5 in all 4 extremities.   Sensory: Sensory testing is intact to pinprick, soft touch and vibration sensation in all 4 extremities.  Coordination: Cerebellar testing reveals reduced finger-nose-finger and reduced heel-to-shin on the left.  These are normal on the right.  Gait and station: Station is normal.   Gait is normal. Tandem gait is wide. Romberg is negative.   Reflexes: Deep tendon reflexes are symmetric and normal bilaterally.   Plantar responses are flexor.    DIAGNOSTIC DATA (LABS, IMAGING, TESTING) - I reviewed patient records, labs, notes, testing and imaging myself where available.  Lab Results  Component Value Date   WBC 8.8 05/14/2016   HGB 12.0 (L) 05/14/2016   HCT 35.5 (L)  05/14/2016   MCV 90.6 05/14/2016   PLT 178 05/14/2016      Component  Value Date/Time   NA 138 05/14/2016 0317   K 3.8 05/14/2016 0317   CL 103 05/14/2016 0317   CO2 28 05/14/2016 0317   GLUCOSE 116 (H) 05/14/2016 0317   BUN 20 05/14/2016 0317   CREATININE 1.53 (H) 05/14/2016 0317   CREATININE 1.46 (H) 02/27/2016 0820   CALCIUM 9.1 05/14/2016 0317   PROT 7.3 05/07/2016 0847   ALBUMIN 3.7 05/07/2016 0847   AST 28 05/07/2016 0847   ALT 23 05/07/2016 0847   ALKPHOS 32 (L) 05/07/2016 0847   BILITOT 0.6 05/07/2016 0847   GFRNONAA 42 (L) 05/14/2016 0317   GFRAA 49 (L) 05/14/2016 0317   Lab Results  Component Value Date   CHOL 136 12/31/2015   HDL 33 (L) 12/31/2015   LDLCALC 71 12/31/2015   TRIG 160 (H) 12/31/2015   CHOLHDL 4.1 12/31/2015   No results found for: HGBA1C No results found for: VITAMINB12 Lab Results  Component Value Date   TSH 1.57 02/13/2016       ASSESSMENT AND PLAN  Lacunar stroke ([place]) - Plan: MR BRAIN WO CONTRAST, MR MRA HEAD WO CONTRAST, US Carotid Bilateral, Split night study  Atherosclerosis of native arteries of extremities with intermittent claudication, right leg (HCC)  Coronary artery disease involving native coronary artery of native heart without angina pectoris  Insulin dependent diabetes mellitus (HCC)  Essential hypertension  Pure hypercholesterolemia  Left leg weakness  OSA (obstructive sleep apnea) - Plan: Split night study  Tobacco abuse   In summary, Mr. Arthur [name] is a 76 year old man who appears to have had a small stroke about 5 weeks ago. The CT scan actually shows 3 lacunar infarctions and other chronic microvascular ischemic change.    Two of the chronic lacunar infarctions on CT (performed a couple weeks after symptoms), especially the small one of the internal capsule, could easily have caused his symptoms.   I showed the actual CT scan images to Mr. Whitmire and his family. We need to check an MRI of the brain and MR  angiogram of the intracranial arteries to determine if he has intracranial stenosis and a carotid ultrasound to determine if he has significant carotid stenosis. Additionally, he may have sleep apnea that could be another risk factor that can be treated. We will check a split-night polysomnogram to further evaluate this possibility  He has multiple vascular risk factors including smoking, CAD/PVD, hyperlipidemia,  hypertension and insulin-dependent diabetes.     We had a good conversation about smoking cessation. He notes that he still smokes about 5 cigarettes a day. He never smoked more than one pack per day. He would prefer to try to quit smoking by tapering over the next month rather than using a nicotine patch or an oral medication. I'm in agreement with that plan but we will use a prescription strength medicine if he has not completely quit by the time of his next visit.  For the time being I will keep him on aspirin but I would consider switching to Plavix 75 mg/aspirin 81 mg if cardiology is in agreement and further risk factors are identified when he undergoes carotid ultrasound and imaging studies.  He will return to see me in 2 months but call sooner if he has new or worsening neurologic symptoms.  Thank you for asking me to see [name]. Please let me know if I can be of further assistance with him or other patients in the future.   Jamespaul Secrist A. Felecia Shelling, MD, [facility] 3/55/7322, 02:54 PM Certified  in Neurology, Clinical Neurophysiology, Sleep Medicine, Pain Medicine and Neuroimaging  [facility] [address] [place], [place] [address] [phone]

## 2017-01-28 ENCOUNTER — Other Ambulatory Visit: Payer: Self-pay | Admitting: Neurology

## 2017-01-28 DIAGNOSIS — Z77018 Contact with and (suspected) exposure to other hazardous metals: Secondary | ICD-10-CM

## 2017-01-31 ENCOUNTER — Other Ambulatory Visit: Payer: Self-pay | Admitting: Cardiovascular Disease

## 2017-02-10 ENCOUNTER — Ambulatory Visit
Admission: RE | Admit: 2017-02-10 | Discharge: 2017-02-10 | Disposition: A | Discharge status: Home / Self Care | Payer: Medicare Other | Source: Ambulatory Visit | Attending: Neurology | Admitting: Neurology

## 2017-02-10 DIAGNOSIS — I639 Cerebral infarction, unspecified: Secondary | ICD-10-CM | POA: Diagnosis not present

## 2017-02-10 DIAGNOSIS — I6381 Other cerebral infarction due to occlusion or stenosis of small artery: Secondary | ICD-10-CM

## 2017-02-10 DIAGNOSIS — Z77018 Contact with and (suspected) exposure to other hazardous metals: Secondary | ICD-10-CM

## 2017-02-10 DIAGNOSIS — Z01818 Encounter for other preprocedural examination: Secondary | ICD-10-CM | POA: Diagnosis not present

## 2017-02-15 ENCOUNTER — Telehealth: Payer: Self-pay

## 2017-02-15 NOTE — Telephone Encounter (Signed)
I called pt. I advised him that Dr. Felecia Shelling reviewed his MRI brain, MRA and carotid doppler. I advised pt that his MRI showed old strokes, but nothing looked new and there were no changes from the previous scan. The MRA was normal. The carotid dopplers showed mild narrowing of the arteries but not enough to be significant. I advised pt to continue on the aspirin. Pt verbalized understanding of results. I reminded pt of his appt with Dr. Felecia Shelling in September. Pt had no questions at this time but was encouraged to call back if questions arise.

## 2017-02-15 NOTE — Telephone Encounter (Signed)
-----   Message from Britt Bottom, MD sent at 02/15/2017  8:10 AM EDT ----- Please let him know that the studies look ok: The MRI of the brain showed the old strokes but nothing looked at new and there were no changes compared to his previous scan The MR of the arteries of the brain (MRA) was normal The carotid Dopplers showed mild narrowing of the arteries, not enough to be significant.     Continue on the aspirin.

## 2017-02-24 DIAGNOSIS — D631 Anemia in chronic kidney disease: Secondary | ICD-10-CM | POA: Diagnosis not present

## 2017-02-24 DIAGNOSIS — D531 Other megaloblastic anemias, not elsewhere classified: Secondary | ICD-10-CM | POA: Diagnosis not present

## 2017-02-24 DIAGNOSIS — N2581 Secondary hyperparathyroidism of renal origin: Secondary | ICD-10-CM | POA: Diagnosis not present

## 2017-02-24 DIAGNOSIS — N183 Chronic kidney disease, stage 3 (moderate): Secondary | ICD-10-CM | POA: Diagnosis not present

## 2017-03-04 DIAGNOSIS — R350 Frequency of micturition: Secondary | ICD-10-CM | POA: Diagnosis not present

## 2017-03-04 DIAGNOSIS — N183 Chronic kidney disease, stage 3 (moderate): Secondary | ICD-10-CM | POA: Diagnosis not present

## 2017-03-06 ENCOUNTER — Other Ambulatory Visit: Payer: Self-pay | Admitting: Cardiovascular Disease

## 2017-03-08 NOTE — Telephone Encounter (Signed)
Rx(s) sent to pharmacy electronically.  

## 2017-04-01 ENCOUNTER — Ambulatory Visit: Payer: Medicare Other | Admitting: Neurology

## 2017-04-07 DIAGNOSIS — E1122 Type 2 diabetes mellitus with diabetic chronic kidney disease: Secondary | ICD-10-CM | POA: Diagnosis not present

## 2017-04-07 DIAGNOSIS — N183 Chronic kidney disease, stage 3 (moderate): Secondary | ICD-10-CM | POA: Diagnosis not present

## 2017-04-07 DIAGNOSIS — E782 Mixed hyperlipidemia: Secondary | ICD-10-CM | POA: Diagnosis not present

## 2017-04-07 DIAGNOSIS — I129 Hypertensive chronic kidney disease with stage 1 through stage 4 chronic kidney disease, or unspecified chronic kidney disease: Secondary | ICD-10-CM | POA: Diagnosis not present

## 2017-05-01 ENCOUNTER — Other Ambulatory Visit: Payer: Self-pay | Admitting: Cardiovascular Disease

## 2017-05-03 NOTE — Telephone Encounter (Signed)
Rx(s) sent to pharmacy electronically.  

## 2017-05-13 ENCOUNTER — Encounter: Payer: Self-pay | Admitting: Neurology

## 2017-05-13 ENCOUNTER — Ambulatory Visit (INDEPENDENT_AMBULATORY_CARE_PROVIDER_SITE_OTHER): Payer: Medicare Other | Admitting: Neurology

## 2017-05-13 VITALS — BP 163/76 | HR 55 | Resp 16 | Ht 70.0 in | Wt 186.5 lb

## 2017-05-13 DIAGNOSIS — R29898 Other symptoms and signs involving the musculoskeletal system: Secondary | ICD-10-CM

## 2017-05-13 DIAGNOSIS — I6381 Other cerebral infarction due to occlusion or stenosis of small artery: Secondary | ICD-10-CM | POA: Diagnosis not present

## 2017-05-13 DIAGNOSIS — I2510 Atherosclerotic heart disease of native coronary artery without angina pectoris: Secondary | ICD-10-CM | POA: Diagnosis not present

## 2017-05-13 NOTE — Progress Notes (Signed)
[facility]  PATIENT: Arthur [name] DOB: 12/27/1940  REFERRING DOCTOR OR PCP:  Minette Brine, FNP SOURCE: Patient, family, notes from PCP, imaging/lab reports, CT scan was personally reviewed on PACS.  _________________________________   HISTORICAL  CHIEF COMPLAINT:  Chief Complaint  Patient presents with  . History of CVA    Denies new stroke sx.  Taking ASA as rx'd.  Has decreased smoking to 3-4 cigarettes per day/fim    HISTORY OF PRESENT ILLNESS:  I had the pleasure seeing you patient, Arthur [name]) Marisue Ivan, at [facility] for neurologic consultation regarding his mild left-sided weakness and abnormal CT scan.  Update 05/13/2017:    The left sided weakness has resolved but he has trouble controlling hte left leg and arm.  He feels his gait has a mild limp.       He is on aspirin and denies any stomach issues.    He has had heartburn with fish oil.      He now only smokes one pack per week.    He is quitting on his own without medication and prefers to do it as a slow taper.      He has been told he snores when he sleeps but has never been told he has pauses in his breathing and does not wake up gasping for air.   He easily falls asleep during the day for a nap but does not doze off when he does not want to.   The sleep study was ordered but he canceled it because he does not feel he will use any CPAP treatment if it was discovered.  He has several risk factors.   He has CAD and has had PTCA.   He has had vascular surgery for PAD.  He has poorly controlled DM and is on insulin.   He has HTN and hyperlipidemia also.     He also has mild CRF.      ___________________________________________________ From 01/27/2017:  In early June 2018, he woke up sweating.   When he got out of bed, he fell and he noticed the left side was weak.   He got himself back up and was able to walk but dragged the left leg.   His sugars were checked and it was fine.  The  left arm was also mildly weak.    Over the next couple weeks, strength improved but he still notes minimal left sided weakness.      Of note, he was on aspirin at the time (has been on 325 mg > 25 years).    He saw primary care 12/31/2016 due to the weakness in the left arm and leg. There is no tremor reviewed. He was felt to have weakness and mild dysmetria and the CT scan and lab work was ordered. CT scan showed ischemic changes that appear to be chronic. By my review there are 3 lacunar infarctions, one in the centrum semiovale bilaterally and one in the anterior limb of the right internal capsule.   Additionally, some calcification is noted in the internal carotid arteries and vertebral arteries  He feels he is still improving and not yet to baseline.     He denies any similar event in the past and he had never had a brain scan before last month.      He has several risk factors.   He has CAD and has had PTCA.   He has had vascular surgery for PAD.  He has poorly  controlled DM and is on insulin.   He has HTN and hyperlipidemia also.     He also has mild CRF.      He snores loudly at night x years.   Although definite pauses have not been noted he sometimes jerks after a louder snore.     I personally reviewed the CT scan performed 01/07/2017. It is abnormal showing to moderate size lacunar instructions in the centrum semiovale ovale above the basal ganglia and one tiny lacunar infarction in the anterior limb of the right internal capsule. Additionally, there are hyperdense signal changes in the periventricular and deep white matter consistent with chronic microvascular ischemic change. Brain volume is slightly reduced.  Blood work was reviewed. The hemoglobin A1c was elevated at 7.7. HDL is reduced at 35 and triglycerides were minimally elevated at 157. GFR was slightly reduced at 50 and glucose was mildly elevated. TSH was normal.  REVIEW OF SYSTEMS: Constitutional: No fevers, chills, sweats,  or change in appetite Eyes: No visual changes, double vision, eye pain Ear, nose and throat: No hearing loss, ear pain, nasal congestion, sore throat Cardiovascular: History of coronary artery disease and peripheral vascular disease Respiratory: No shortness of breath at rest or with exertion.   No wheezes GastrointestinaI: No nausea, vomiting, diarrhea, abdominal pain, fecal incontinence Genitourinary: No dysuria, urinary retention or frequency.  No nocturia. Musculoskeletal: No neck pain, back pain Integumentary: No rash, pruritus, skin lesions Neurological: as above Psychiatric: No depression at this time.  No anxiety Endocrine: No palpitations, diaphoresis, change in appetite, change in weigh or increased thirst Hematologic/Lymphatic: No anemia, purpura, petechiae.   Occasional bruising on his arms Allergic/Immunologic: No itchy/runny eyes, nasal congestion, recent allergic reactions, rashes  ALLERGIES: Allergies  Allergen Reactions  . Codeine Swelling    HOME MEDICATIONS:  Current Outpatient Prescriptions:  .  aspirin 325 MG EC tablet, Take 325 mg by mouth daily., Disp: , Rfl:  .  atorvastatin (LIPITOR) 20 MG tablet, TAKE 1 TABLET (20 MG TOTAL) BY MOUTH DAILY., Disp: 90 tablet, Rfl: 2 .  cholecalciferol (VITAMIN D) 1000 UNITS tablet, Take 1,000 Units by mouth daily., Disp: , Rfl:  .  doxazosin (CARDURA) 1 MG tablet, Take by mouth 3 (three) times daily. Pt takes 3 at bedtime, Disp: , Rfl:  .  fenofibrate 160 MG tablet, TAKE 1 TABLET (160 MG TOTAL) BY MOUTH DAILY., Disp: 30 tablet, Rfl: 6 .  fish oil-omega-3 fatty acids 1000 MG capsule, Take 2 g by mouth 2 (two) times daily. , Disp: , Rfl:  .  insulin lispro (HUMALOG) 100 UNIT/ML injection, Inject 40 Units into the skin 2 (two) times daily., Disp: , Rfl:  .  metoprolol tartrate (LOPRESSOR) 50 MG tablet, TAKE 1 TABLET (50 MG TOTAL) BY MOUTH 2 (TWO) TIMES DAILY., Disp: 180 tablet, Rfl: 3 .  Misc Natural Products (OSTEO BI-FLEX  TRIPLE STRENGTH) TABS, Take 1 tablet by mouth 2 (two) times daily., Disp: , Rfl:  .  Multiple Vitamins-Minerals (CENTRUM SILVER PO), Take 1 tablet by mouth daily., Disp: , Rfl:   PAST MEDICAL HISTORY: Past Medical History:  Diagnosis Date  . Arthritis   . Chronic kidney disease   . Claudication ([place])   . Coronary artery disease 1997   CFX PCI, '97. Mod residual RI and RCA dis.  . Diabetes mellitus ([place])    IDDM  . Dyslipidemia   . Hard of hearing   . History of bronchitis   . History of kidney stones 1958  .  HTN (hypertension)   . PAD (peripheral artery disease) ([place])   . Pneumonia   . Tobacco abuse   . Urinary incontinence   . Wears glasses     PAST SURGICAL HISTORY: Past Surgical History:  Procedure Laterality Date  . APPENDECTOMY     "busted on me"  . COLONOSCOPY    . CORONARY ANGIOPLASTY  1997   CFX  . ENDARTERECTOMY FEMORAL Right 05/13/2016   iliofemoral endarterectomy with bovine pericardial patch angioplasty  . ENDARTERECTOMY FEMORAL Right 05/13/2016   Procedure: RIGHT ENDARTERECTOMY FEMORAL;  Surgeon: Serafina Mitchell, MD;  Location: [facility];  Service: Vascular;  Laterality: Right;  . PATCH ANGIOPLASTY Right 05/13/2016   Procedure: [place];  Surgeon: Serafina Mitchell, MD;  Location: [facility];  Service: Vascular;  Laterality: Right;  . PERIPHERAL VASCULAR CATHETERIZATION N/A 02/20/2016   Procedure: Lower Extremity Angiography;  Surgeon: Lorretta Harp, MD;  Location: [facility];  Service: Cardiovascular;  Laterality: N/A;    FAMILY HISTORY: Family History  Problem Relation Age of Onset  . Diabetes Sister   . Arthritis Mother   . Heart disease Father     SOCIAL HISTORY:  Social History   Social History  . Marital status: Married    Spouse name: N/A  . Number of children: N/A  . Years of education: N/A   Occupational History  . Not on file.   Social History Main Topics  . Smoking status: Former Smoker     Packs/day: 0.25    Years: 65.00    Types: Cigarettes  . Smokeless tobacco: Never Used     Comment: 05/13/2016 "stopped smoking ~ 1 month ago"  . Alcohol use No  . Drug use: No  . Sexual activity: Not on file   Other Topics Concern  . Not on file   Social History Narrative   Retired from [employer]. Married 2 children, 11 grand children, 6 great grand children     PHYSICAL EXAM  Vitals:   05/13/17 0814  BP: (!) 163/76  Pulse: (!) 55  Resp: 16  Weight: 186 lb 8 oz (84.6 kg)  Height: 5\' 10"  (1.778 m)    Body mass index is 26.76 kg/m.   General: The patient is well-developed and well-nourished and in no acute distress   Neurologic Exam  Mental status: The patient is alert and oriented x 3 at the time of the examination. The patient has apparent normal recent and remote memory, with an apparently normal attention span and concentration ability.   Speech is normal.  Cranial nerves: Extraocular movements are full. Facial strength and sensation is normal. Trapezius strength is normal  The tongue is midline, and the patient has symmetric elevation of the soft palate. Mild bilateral hearing deficits are noted.  Motor:  Muscle bulk is normal.   Tone is normal. Strength is  5 / 5 in all 4 extremities.   Sensory: Sensory testing is intact to touch and vibration in the arms and legs.  Coordination: Cerebellar testing slightly reduced finger-nose-finger and mildly reduced heel-to-shin on the left. Right finger-to-nose and heel-to-shin is normal.  Gait and station: Station is normal.   Gait is normal. Tandem gait is mildly wide. Romberg is negative.   Reflexes: Deep tendon reflexes are symmetric and normal bilaterally.        DIAGNOSTIC DATA (LABS, IMAGING, TESTING) - I reviewed patient records, labs, notes, testing and imaging myself where available.  Lab Results  Component Value  Date   WBC 8.8 05/14/2016   HGB 12.0 (L) 05/14/2016   HCT 35.5 (L) 05/14/2016   MCV 90.6  05/14/2016   PLT 178 05/14/2016      Component Value Date/Time   NA 138 05/14/2016 0317   K 3.8 05/14/2016 0317   CL 103 05/14/2016 0317   CO2 28 05/14/2016 0317   GLUCOSE 116 (H) 05/14/2016 0317   BUN 20 05/14/2016 0317   CREATININE 1.53 (H) 05/14/2016 0317   CREATININE 1.46 (H) 02/27/2016 0820   CALCIUM 9.1 05/14/2016 0317   PROT 7.3 05/07/2016 0847   ALBUMIN 3.7 05/07/2016 0847   AST 28 05/07/2016 0847   ALT 23 05/07/2016 0847   ALKPHOS 32 (L) 05/07/2016 0847   BILITOT 0.6 05/07/2016 0847   GFRNONAA 42 (L) 05/14/2016 0317   GFRAA 49 (L) 05/14/2016 0317   Lab Results  Component Value Date   CHOL 136 12/31/2015   HDL 33 (L) 12/31/2015   LDLCALC 71 12/31/2015   TRIG 160 (H) 12/31/2015   CHOLHDL 4.1 12/31/2015   No results found for: HGBA1C No results found for: VITAMINB12 Lab Results  Component Value Date   TSH 1.57 02/13/2016       ASSESSMENT AND PLAN  Lacunar stroke  Left leg weakness  Coronary artery disease involving native coronary artery of native heart without angina pectoris   1.    He will continue aspirin and try to increase the fish oil today to 3 times a day if he is able to tolerate that.  2.    We discussed trying to taper the smoking them further.  3.    He does not want to have a sleep study as he does not feel he will use CPAP or an oral appliance if the ulcer is detected. 4.     Return as needed. Advised to call if any new or worsening neurologic symptoms  Kourosh Jablonsky A. Felecia Shelling, MD, [facility] [id number], 2:20 AM Certified in Neurology, Clinical Neurophysiology, Sleep Medicine, Pain Medicine and Neuroimaging  [facility] [address] [place], [place] [address] [phone]

## 2017-05-26 ENCOUNTER — Other Ambulatory Visit: Payer: Self-pay | Admitting: Cardiovascular Disease

## 2017-05-26 DIAGNOSIS — I739 Peripheral vascular disease, unspecified: Secondary | ICD-10-CM

## 2017-06-14 ENCOUNTER — Inpatient Hospital Stay (HOSPITAL_COMMUNITY): Admission: RE | Admit: 2017-06-14 | Payer: Medicare Other | Source: Ambulatory Visit

## 2017-07-29 DIAGNOSIS — I129 Hypertensive chronic kidney disease with stage 1 through stage 4 chronic kidney disease, or unspecified chronic kidney disease: Secondary | ICD-10-CM | POA: Diagnosis not present

## 2017-07-29 DIAGNOSIS — N183 Chronic kidney disease, stage 3 (moderate): Secondary | ICD-10-CM | POA: Diagnosis not present

## 2017-07-29 DIAGNOSIS — E782 Mixed hyperlipidemia: Secondary | ICD-10-CM | POA: Diagnosis not present

## 2017-07-29 DIAGNOSIS — N08 Glomerular disorders in diseases classified elsewhere: Secondary | ICD-10-CM | POA: Diagnosis not present

## 2017-07-29 DIAGNOSIS — E119 Type 2 diabetes mellitus without complications: Secondary | ICD-10-CM | POA: Diagnosis not present

## 2017-08-02 DIAGNOSIS — H40033 Anatomical narrow angle, bilateral: Secondary | ICD-10-CM | POA: Diagnosis not present

## 2017-08-02 DIAGNOSIS — E119 Type 2 diabetes mellitus without complications: Secondary | ICD-10-CM | POA: Diagnosis not present

## 2017-08-26 DIAGNOSIS — N183 Chronic kidney disease, stage 3 (moderate): Secondary | ICD-10-CM | POA: Diagnosis not present

## 2017-08-26 DIAGNOSIS — D631 Anemia in chronic kidney disease: Secondary | ICD-10-CM | POA: Diagnosis not present

## 2017-09-20 DIAGNOSIS — N183 Chronic kidney disease, stage 3 (moderate): Secondary | ICD-10-CM | POA: Diagnosis not present

## 2017-09-20 DIAGNOSIS — I129 Hypertensive chronic kidney disease with stage 1 through stage 4 chronic kidney disease, or unspecified chronic kidney disease: Secondary | ICD-10-CM | POA: Diagnosis not present

## 2017-09-20 DIAGNOSIS — E1121 Type 2 diabetes mellitus with diabetic nephropathy: Secondary | ICD-10-CM | POA: Diagnosis not present

## 2017-09-20 DIAGNOSIS — N2581 Secondary hyperparathyroidism of renal origin: Secondary | ICD-10-CM | POA: Diagnosis not present

## 2017-09-20 DIAGNOSIS — D509 Iron deficiency anemia, unspecified: Secondary | ICD-10-CM | POA: Diagnosis not present

## 2017-10-26 ENCOUNTER — Other Ambulatory Visit: Payer: Self-pay | Admitting: Cardiovascular Disease

## 2017-10-26 NOTE — Telephone Encounter (Signed)
REFILL 

## 2017-10-27 DIAGNOSIS — E782 Mixed hyperlipidemia: Secondary | ICD-10-CM | POA: Diagnosis not present

## 2017-10-27 DIAGNOSIS — R21 Rash and other nonspecific skin eruption: Secondary | ICD-10-CM | POA: Diagnosis not present

## 2017-10-27 DIAGNOSIS — I129 Hypertensive chronic kidney disease with stage 1 through stage 4 chronic kidney disease, or unspecified chronic kidney disease: Secondary | ICD-10-CM | POA: Diagnosis not present

## 2017-10-27 DIAGNOSIS — E1122 Type 2 diabetes mellitus with diabetic chronic kidney disease: Secondary | ICD-10-CM | POA: Diagnosis not present

## 2017-10-27 DIAGNOSIS — N08 Glomerular disorders in diseases classified elsewhere: Secondary | ICD-10-CM | POA: Diagnosis not present

## 2017-10-27 DIAGNOSIS — N183 Chronic kidney disease, stage 3 (moderate): Secondary | ICD-10-CM | POA: Diagnosis not present

## 2017-11-27 ENCOUNTER — Other Ambulatory Visit: Payer: Self-pay | Admitting: Cardiovascular Disease

## 2017-11-29 NOTE — Telephone Encounter (Signed)
Rx(s) sent to pharmacy electronically.  

## 2017-11-30 ENCOUNTER — Other Ambulatory Visit: Payer: Self-pay | Admitting: Cardiovascular Disease

## 2017-12-17 ENCOUNTER — Encounter: Payer: Self-pay | Admitting: Cardiovascular Disease

## 2017-12-17 ENCOUNTER — Ambulatory Visit: Payer: Medicare Other | Admitting: Cardiovascular Disease

## 2017-12-17 VITALS — BP 132/69 | HR 82 | Ht 69.5 in | Wt 178.0 lb

## 2017-12-17 DIAGNOSIS — E7800 Pure hypercholesterolemia, unspecified: Secondary | ICD-10-CM

## 2017-12-17 DIAGNOSIS — I739 Peripheral vascular disease, unspecified: Secondary | ICD-10-CM

## 2017-12-17 DIAGNOSIS — Z720 Tobacco use: Secondary | ICD-10-CM | POA: Diagnosis not present

## 2017-12-17 DIAGNOSIS — I10 Essential (primary) hypertension: Secondary | ICD-10-CM

## 2017-12-17 NOTE — Progress Notes (Signed)
12/17/2017 Arthur [name]   15-Jan-1941  [id number]  Primary Physician [name], MD Primary Cardiologist: Lorretta Harp MD FACP, [place], [place], [place]  HPI:  Arthur [name] is a 77 y.o.  mildly overweight, married Caucasian male, father of 2, grandfather to 63 grandchildren who I saw  12/16/2016. He has a history of CAD status post circumflex intervention by myself May 20, 1996. He also had a 60% ostial ramus branch stenosis, a 70% distal RCA stenosis and normal LV function. His last functional study performed August 15, 2010, was nonischemic. He does continue to smoke 3 cigarettes a day. His other problems include hypertension, hyperlipidemia and noninsulin-requiring diabetes. Dr. Karlton Lemon follows his lipid profile. . I obtained lower extremity arterial Doppler studies on 10/15/14 which revealed ABIs showing a right ABI of 1.86. There was a high-frequency signal in his distal left SFA. He now complains of right lower extremity claudication. He denies chest pain or shortness of breath. Dopplers performed 01/28/16 revealed a decline in his right ABI from one down to 0.56 with a new high-frequency signal in his right common femoral artery. Because of an elevated creatinine the patient was admitted yesterday for hydration. His serum creatinine did improve. He underwent lower extremity angiography by myself 02/19/16 revealing a 99% calcified exophytic right common femoral artery stenosis, 75% segmental calcified mid right SFA stenosis with 2 vessel runoff. He underwent right iliofemoral endarterectomy with bovine pericardial patch angioplasty by Dr. Trula Slade 05/13/16 with an excellent clinical result. He no longer has claudication on that side. His follow-up Dopplers performed 06/09/16 revealed normalization of his right ABI 1.0. His right common femoral velocities have normalized as well. He Says that his left leg actually improved as well after his right common femoral endarterectomy. He denies chest pain or  shortness of breath Since I saw him a year ago he did have a lacunar stroke 12/18/2016 resulting in left upper and lower extremity impairment.  He now walks with an unsteady gait.  Continues to deny chest pain but does have shortness of breath which sounds chronic.     Current Meds  Medication Sig  . aspirin 325 MG EC tablet Take 325 mg by mouth daily.  Marland Kitchen atorvastatin (LIPITOR) 20 MG tablet TAKE 1 TABLET (20 MG TOTAL) BY MOUTH DAILY.  . cholecalciferol (VITAMIN D) 1000 UNITS tablet Take 1,000 Units by mouth daily.  Marland Kitchen doxazosin (CARDURA) 1 MG tablet Take by mouth 3 (three) times daily. Pt takes 3 at bedtime  . fenofibrate 160 MG tablet TAKE 1 TABLET (160 MG TOTAL) BY MOUTH DAILY.  . fish oil-omega-3 fatty acids 1000 MG capsule Take 2 g by mouth 2 (two) times daily.   . insulin lispro (HUMALOG) 100 UNIT/ML injection Inject 40 Units into the skin 2 (two) times daily.  . metoprolol tartrate (LOPRESSOR) 50 MG tablet Take 1 tablet (50 mg total) by mouth 2 (two) times daily. KEEP OV.  . Misc Natural Products (OSTEO BI-FLEX TRIPLE STRENGTH) TABS Take 1 tablet by mouth 2 (two) times daily.  . Multiple Vitamins-Minerals (CENTRUM SILVER PO) Take 1 tablet by mouth daily.     Allergies  Allergen Reactions  . Codeine Swelling    Social History   Socioeconomic History  . Marital status: Married    Spouse name: Not on file  . Number of children: Not on file  . Years of education: Not on file  . Highest education level: Not on file  Occupational History  . Not on file  Social Needs  . Financial resource strain: Not on file  . Food insecurity:    Worry: Not on file    Inability: Not on file  . Transportation needs:    Medical: Not on file    Non-medical: Not on file  Tobacco Use  . Smoking status: Former Smoker    Packs/day: 0.25    Years: 65.00    Pack years: 16.25    Types: Cigarettes  . Smokeless tobacco: Never Used  . Tobacco comment: 05/13/2016 "stopped smoking ~ 1 month ago"    Substance and Sexual Activity  . Alcohol use: No  . Drug use: No  . Sexual activity: Not on file  Lifestyle  . Physical activity:    Days per week: Not on file    Minutes per session: Not on file  . Stress: Not on file  Relationships  . Social connections:    Talks on phone: Not on file    Gets together: Not on file    Attends religious service: Not on file    Active member of club or organization: Not on file    Attends meetings of clubs or organizations: Not on file    Relationship status: Not on file  . Intimate partner violence:    Fear of current or ex partner: Not on file    Emotionally abused: Not on file    Physically abused: Not on file    Forced sexual activity: Not on file  Other Topics Concern  . Not on file  Social History Narrative   Retired from [employer]. Married 2 children, 11 grand children, 6 great grand children     Review of Systems: General: negative for chills, fever, night sweats or weight changes.  Cardiovascular: negative for chest pain, dyspnea on exertion, edema, orthopnea, palpitations, paroxysmal nocturnal dyspnea or shortness of breath Dermatological: negative for rash Respiratory: negative for cough or wheezing Urologic: negative for hematuria Abdominal: negative for nausea, vomiting, diarrhea, bright red blood per rectum, melena, or hematemesis Neurologic: negative for visual changes, syncope, or dizziness All other systems reviewed and are otherwise negative except as noted above.    Blood pressure 132/69, pulse 82, height 5' 9.5" (1.765 m), weight 178 lb (80.7 kg).  General appearance: alert and no distress Neck: no adenopathy, no carotid bruit, no JVD, supple, symmetrical, trachea midline and thyroid not enlarged, symmetric, no tenderness/mass/nodules Lungs: clear to auscultation bilaterally Heart: regular rate and rhythm, S1, S2 normal, no murmur, click, rub or gallop Extremities: extremities normal, atraumatic, no cyanosis or  edema Pulses: 2+ and symmetric Skin: Skin color, texture, turgor normal. No rashes or lesions Neurologic: Alert and oriented X 3, normal strength and tone. Normal symmetric reflexes. Normal coordination and gait  EKG normal sinus rhythm at 69 .  I personally reviewed this EKG.  ASSESSMENT AND PLAN:   Coronary artery disease History of CAD status post circumflex intermittent intervention by myself 05/20/1996.  He also had 60% ostial ramus branch stenosis, 70% distal RCA normal LV function.  His last functional study performed 08/15/2010 was nonischemic.  He denies chest pain but does have chronic shortness of breath.  Essential hypertension History of essential hypertension her blood pressure measured at 132/69.  He is on metoprolol.  Continue current meds at current dosing  Hyperlipidemia History of hyperlipidemia on statin therapy  PAD (peripheral artery disease) ([place]) History of PAD status post peripheral angiography by myself 02/19/2016 revealing a high-grade calcified exophytic right common femoral artery stenosis along with a  75% calcified mid right SFA stenosis two-vessel runoff.  He ultimately underwent right iliofemoral endarterectomy with bovine pericardial patch angioplasty by Dr.Brabham 05/13/2016 with an excellent clinical result.  His Dopplers improved.  He no longer has claudication.  His last lower extremity arterial Doppler studies performed 06/09/2016 revealed a right ABI of 1 and a left of 0.78.  His right common femoral velocities were normal.  Tobacco abuse History of continued tobacco abuse although markedly diminished compared to prior visits.      Lorretta Harp MD FACP,FACC,FAHA, [facility] 12/17/2017 8:22 AM

## 2017-12-17 NOTE — Assessment & Plan Note (Signed)
History of PAD status post peripheral angiography by myself 02/19/2016 revealing a high-grade calcified exophytic right common femoral artery stenosis along with a 75% calcified mid right SFA stenosis two-vessel runoff.  He ultimately underwent right iliofemoral endarterectomy with bovine pericardial patch angioplasty by Dr.Brabham 05/13/2016 with an excellent clinical result.  His Dopplers improved.  He no longer has claudication.  His last lower extremity arterial Doppler studies performed 06/09/2016 revealed a right ABI of 1 and a left of 0.78.  His right common femoral velocities were normal.

## 2017-12-17 NOTE — Assessment & Plan Note (Signed)
History of continued tobacco abuse although markedly diminished compared to prior visits.

## 2017-12-17 NOTE — Assessment & Plan Note (Signed)
History of essential hypertension her blood pressure measured at 132/69.  He is on metoprolol.  Continue current meds at current dosing

## 2017-12-17 NOTE — Patient Instructions (Signed)
Medication Instructions: Your physician recommends that you continue on your current medications as directed. Please refer to the Current Medication list given to you today.  Testing/Procedures: Your physician has requested that you have a lower extremity arterial duplex. During this test, ultrasound is used to evaluate arterial blood flow in the legs. Allow one hour for this exam. There are no restrictions or special instructions.  Your physician has requested that you have an ankle brachial index (ABI). During this test an ultrasound and blood pressure cuff are used to evaluate the arteries that supply the arms and legs with blood. Allow thirty minutes for this exam. There are no restrictions or special instructions.  Follow-Up: Your physician wants you to follow-up in: 1 year with Dr. Gwenlyn Found. You will receive a reminder letter in the mail two months in advance. If you don't receive a letter, please call our office to schedule the follow-up appointment.  If you need a refill on your cardiac medications before your next appointment, please call your pharmacy.

## 2017-12-17 NOTE — Assessment & Plan Note (Signed)
History of hyperlipidemia on statin therapy. 

## 2017-12-17 NOTE — Assessment & Plan Note (Signed)
History of CAD status post circumflex intermittent intervention by myself 05/20/1996.  He also had 60% ostial ramus branch stenosis, 70% distal RCA normal LV function.  His last functional study performed 08/15/2010 was nonischemic.  He denies chest pain but does have chronic shortness of breath.

## 2018-02-24 ENCOUNTER — Other Ambulatory Visit: Payer: Self-pay | Admitting: Cardiovascular Disease

## 2018-02-24 DIAGNOSIS — I739 Peripheral vascular disease, unspecified: Secondary | ICD-10-CM

## 2018-02-25 ENCOUNTER — Other Ambulatory Visit: Payer: Self-pay

## 2018-02-25 ENCOUNTER — Emergency Department (HOSPITAL_COMMUNITY): Payer: Medicare Other

## 2018-02-25 ENCOUNTER — Emergency Department (HOSPITAL_COMMUNITY)
Admission: EM | Admit: 2018-02-25 | Discharge: 2018-02-25 | Disposition: A | Discharge status: Home / Self Care | Payer: Medicare Other | Attending: Emergency Medicine | Admitting: Emergency Medicine

## 2018-02-25 ENCOUNTER — Encounter (HOSPITAL_COMMUNITY): Payer: Self-pay | Admitting: *Deleted

## 2018-02-25 DIAGNOSIS — Z7982 Long term (current) use of aspirin: Secondary | ICD-10-CM | POA: Diagnosis not present

## 2018-02-25 DIAGNOSIS — E119 Type 2 diabetes mellitus without complications: Secondary | ICD-10-CM | POA: Diagnosis not present

## 2018-02-25 DIAGNOSIS — N200 Calculus of kidney: Secondary | ICD-10-CM | POA: Diagnosis not present

## 2018-02-25 DIAGNOSIS — I10 Essential (primary) hypertension: Secondary | ICD-10-CM | POA: Insufficient documentation

## 2018-02-25 DIAGNOSIS — K668 Other specified disorders of peritoneum: Secondary | ICD-10-CM | POA: Insufficient documentation

## 2018-02-25 DIAGNOSIS — Z79899 Other long term (current) drug therapy: Secondary | ICD-10-CM | POA: Insufficient documentation

## 2018-02-25 DIAGNOSIS — I2510 Atherosclerotic heart disease of native coronary artery without angina pectoris: Secondary | ICD-10-CM | POA: Insufficient documentation

## 2018-02-25 DIAGNOSIS — Z955 Presence of coronary angioplasty implant and graft: Secondary | ICD-10-CM | POA: Diagnosis not present

## 2018-02-25 DIAGNOSIS — N289 Disorder of kidney and ureter, unspecified: Secondary | ICD-10-CM | POA: Insufficient documentation

## 2018-02-25 DIAGNOSIS — Z794 Long term (current) use of insulin: Secondary | ICD-10-CM | POA: Insufficient documentation

## 2018-02-25 DIAGNOSIS — R1011 Right upper quadrant pain: Secondary | ICD-10-CM | POA: Diagnosis present

## 2018-02-25 DIAGNOSIS — Z87891 Personal history of nicotine dependence: Secondary | ICD-10-CM | POA: Diagnosis not present

## 2018-02-25 DIAGNOSIS — K669 Disorder of peritoneum, unspecified: Secondary | ICD-10-CM

## 2018-02-25 LAB — BASIC METABOLIC PANEL
Anion gap: 11 (ref 5–15)
BUN: 19 mg/dL (ref 8–23)
CO2: 25 mmol/L (ref 22–32)
Calcium: 9.7 mg/dL (ref 8.9–10.3)
Chloride: 103 mmol/L (ref 98–111)
Creatinine, Ser: 1.51 mg/dL — ABNORMAL HIGH (ref 0.61–1.24)
GFR calc Af Amer: 49 mL/min — ABNORMAL LOW (ref >60)
GFR calc non Af Amer: 42 mL/min — ABNORMAL LOW (ref >60)
Glucose, Bld: 100 mg/dL — ABNORMAL HIGH (ref 70–99)
Potassium: 4.3 mmol/L (ref 3.5–5.1)
Sodium: 139 mmol/L (ref 135–145)

## 2018-02-25 LAB — I-STAT TROPONIN, ED: Troponin i, poc: 0.02 ng/mL (ref 0.00–0.08)

## 2018-02-25 LAB — CBC
HEMATOCRIT: 37.2 % — AB (ref 39.0–52.0)
Hemoglobin: 12.2 g/dL — ABNORMAL LOW (ref 13.0–17.0)
MCH: 30.6 pg (ref 26.0–34.0)
MCHC: 32.8 g/dL (ref 30.0–36.0)
MCV: 93.2 fL (ref 78.0–100.0)
PLATELETS: 314 10*3/uL (ref 150–400)
RBC: 3.99 MIL/uL — AB (ref 4.22–5.81)
RDW: 13.4 % (ref 11.5–15.5)
WBC: 8.7 10*3/uL (ref 4.0–10.5)

## 2018-02-25 LAB — URINALYSIS, ROUTINE W REFLEX MICROSCOPIC
Bilirubin Urine: NEGATIVE
Glucose, UA: 50 mg/dL — AB
Hgb urine dipstick: NEGATIVE
Ketones, ur: NEGATIVE mg/dL
Leukocytes, UA: NEGATIVE
Nitrite: NEGATIVE
Protein, ur: NEGATIVE mg/dL
Specific Gravity, Urine: 1.016 (ref 1.005–1.030)
pH: 6 (ref 5.0–8.0)

## 2018-02-25 MED ORDER — TRAMADOL HCL 50 MG PO TABS: 50.0000 mg | ORAL_TABLET | Freq: Four times a day (QID) | ORAL | 0 refills | Status: DC | PRN

## 2018-02-25 NOTE — ED Triage Notes (Signed)
Pt in c/o shortness of breath and right flank pain for the last few weeks, shortness of breath started this morning, also right shoulder pain radiating into his chest (pt states his shoulder is out of joint), also bit by a tick a few weeks ago and wants to be checked for that. No distress noted

## 2018-02-25 NOTE — ED Provider Notes (Signed)
[facility] EMERGENCY DEPARTMENT Provider Note   CSN: [id number] Arrival date & time: 02/25/18  1034     History   Chief Complaint Chief Complaint  Patient presents with  . Shortness of Breath  . Flank Pain    HPI Arthur [name] is a 77 y.o. male.  HPI Arthur [name] is a 77 y.o. male  With hx of CKD, CAD, DM, HT, presents to ED with complaint of flank pain. Pt states pain started a week ago. Pain on right flank. States it is worse first thing in the morning. Reports its better as the day goes on. Denies associated urinary symptoms. No nausea, vomiting. No fever or chills. Pain is sometimes better when laying on his side. Denies cough, congestion, shortness of breath, chest pain. No injuries.   Past Medical History:  Diagnosis Date  . Arthritis   . Chronic kidney disease   . Claudication ([place])   . Coronary artery disease 1997   CFX PCI, '97. Mod residual RI and RCA dis.  . Diabetes mellitus ([place])    IDDM  . Dyslipidemia   . Hard of hearing   . History of bronchitis   . History of kidney stones 1958  . HTN (hypertension)   . PAD (peripheral artery disease) ([place])   . Pneumonia   . Tobacco abuse   . Urinary incontinence   . Wears glasses     Patient Active Problem List   Diagnosis Date Noted  . Lacunar stroke ([place]) 01/27/2017  . Left leg weakness 01/27/2017  . Tobacco abuse 01/27/2017  . Atherosclerosis of native arteries of extremities with intermittent claudication, right leg ([place]) 05/13/2016  . PAD (peripheral artery disease) ([place]) 02/19/2016  . Coronary artery disease 09/20/2013  . Essential hypertension 09/20/2013  . Hyperlipidemia 09/20/2013  . Insulin dependent diabetes mellitus ([place]) 09/20/2013  . Claudication ([place]) 09/20/2013    Past Surgical History:  Procedure Laterality Date  . APPENDECTOMY     "busted on me"  . COLONOSCOPY    . CORONARY ANGIOPLASTY  1997   CFX  . ENDARTERECTOMY FEMORAL Right 05/13/2016   iliofemoral endarterectomy  with bovine pericardial patch angioplasty  . ENDARTERECTOMY FEMORAL Right 05/13/2016   Procedure: RIGHT ENDARTERECTOMY FEMORAL;  Surgeon: Serafina Mitchell, MD;  Location: [facility];  Service: Vascular;  Laterality: Right;  . PATCH ANGIOPLASTY Right 05/13/2016   Procedure: [place];  Surgeon: Serafina Mitchell, MD;  Location: [facility];  Service: Vascular;  Laterality: Right;  . PERIPHERAL VASCULAR CATHETERIZATION N/A 02/20/2016   Procedure: Lower Extremity Angiography;  Surgeon: Lorretta Harp, MD;  Location: [facility];  Service: Cardiovascular;  Laterality: N/A;        Home Medications    Prior to Admission medications   Medication Sig Start Date End Date Taking? Authorizing Provider  aspirin 325 MG EC tablet Take 325 mg by mouth daily.    [provider]  atorvastatin (LIPITOR) 20 MG tablet TAKE 1 TABLET (20 MG TOTAL) BY MOUTH DAILY. 11/30/17   Lorretta Harp, MD  cholecalciferol (VITAMIN D) 1000 UNITS tablet Take 1,000 Units by mouth daily.    [provider]  doxazosin (CARDURA) 1 MG tablet Take by mouth 3 (three) times daily. Pt takes 3 at bedtime    [provider]  fenofibrate 160 MG tablet TAKE 1 TABLET (160 MG TOTAL) BY MOUTH DAILY. 11/29/17   Lorretta Harp, MD  fish oil-omega-3 fatty acids 1000 MG capsule  Take 2 g by mouth 2 (two) times daily.     [provider]  insulin lispro (HUMALOG) 100 UNIT/ML injection Inject 40 Units into the skin 2 (two) times daily.    [provider]  metoprolol tartrate (LOPRESSOR) 50 MG tablet Take 1 tablet (50 mg total) by mouth 2 (two) times daily. KEEP OV. 10/26/17   Lorretta Harp, MD  Misc Natural Products (OSTEO BI-FLEX TRIPLE STRENGTH) TABS Take 1 tablet by mouth 2 (two) times daily.    [provider]  Multiple Vitamins-Minerals (CENTRUM SILVER PO) Take 1 tablet by mouth daily.    [provider]    Family History Family History    Problem Relation Age of Onset  . Diabetes Sister   . Arthritis Mother   . Heart disease Father     Social History Social History   Tobacco Use  . Smoking status: Former Smoker    Packs/day: 0.25    Years: 65.00    Pack years: 16.25    Types: Cigarettes  . Smokeless tobacco: Never Used  . Tobacco comment: 05/13/2016 "stopped smoking ~ 1 month ago"  Substance Use Topics  . Alcohol use: No  . Drug use: No     Allergies   Codeine   Review of Systems Review of Systems  Constitutional: Negative for chills and fever.  Respiratory: Negative for cough, chest tightness and shortness of breath.   Cardiovascular: Negative for chest pain, palpitations and leg swelling.  Gastrointestinal: Positive for abdominal pain. Negative for abdominal distention, diarrhea, nausea and vomiting.  Genitourinary: Positive for flank pain. Negative for dysuria, frequency, hematuria and urgency.  Musculoskeletal: Negative for arthralgias, myalgias, neck pain and neck stiffness.  Skin: Negative for rash.  Allergic/Immunologic: Negative for immunocompromised state.  Neurological: Negative for dizziness, weakness, light-headedness, numbness and headaches.  All other systems reviewed and are negative.    Physical Exam Updated Vital Signs BP (!) 155/83 (BP Location: Right Arm)   Pulse 81   Temp 98.1 F (36.7 C) (Oral)   Resp 20   SpO2 100%   Physical Exam  Constitutional: He appears well-developed and well-nourished. No distress.  HENT:  Head: Normocephalic and atraumatic.  Eyes: Conjunctivae are normal.  Neck: Neck supple.  Cardiovascular: Normal rate, regular rhythm and normal heart sounds.  Pulmonary/Chest: Effort normal. No respiratory distress. He has no wheezes. He has no rales.  Abdominal: Soft. Bowel sounds are normal. He exhibits no distension. There is tenderness. There is no rebound.  Right mid abd/side tenderness  Musculoskeletal: He exhibits no edema.  Neurological: He is  alert.  Skin: Skin is warm and dry.  Nursing note and vitals reviewed.    ED Treatments / Results  Labs (all labs ordered are listed, but only abnormal results are displayed) Labs Reviewed  BASIC METABOLIC PANEL - Abnormal; Notable for the following components:      Result Value   Glucose, Bld 100 (*)    Creatinine, Ser 1.51 (*)    GFR calc non Af Amer 42 (*)    GFR calc Af Amer 49 (*)    All other components within normal limits  URINALYSIS, ROUTINE W REFLEX MICROSCOPIC - Abnormal; Notable for the following components:   Glucose, UA 50 (*)    All other components within normal limits  CBC - Abnormal; Notable for the following components:   RBC 3.99 (*)    Hemoglobin 12.2 (*)    HCT 37.2 (*)    All other components within  normal limits  I-STAT TROPONIN, ED    EKG None  Radiology Dg Chest 2 View  Result Date: 02/25/2018 CLINICAL DATA:  Shortness of breath and right flank pain for the past few weeks. EXAM: CHEST - 2 VIEW COMPARISON:  Chest x-ray dated February 13, 2016. FINDINGS: Stable mild cardiomegaly. Normal pulmonary vascularity. Atherosclerotic calcification of the aortic arch. The lungs remain hyperinflated with mildly increased retrosternal airspace. New streaky opacity in the right upper lobe and trace bilateral pleural effusions. No pneumothorax. No acute osseous abnormality. Old healed left-sided rib fractures. IMPRESSION: 1. New streaky opacity in the right upper lobe is favored to reflect atelectasis/scarring, although developing pneumonia could have a similar appearance. 2. New trace bilateral pleural effusions. 3. COPD. Electronically Signed   By: Titus Dubin M.D.   On: 02/25/2018 11:27   Ct Renal Stone Study  Result Date: 02/25/2018 CLINICAL DATA:  77 year old male with a history shortness of breath and right flank pain. EXAM: CT ABDOMEN AND PELVIS WITHOUT CONTRAST TECHNIQUE: Multidetector CT imaging of the abdomen and pelvis was performed following the standard  protocol without IV contrast. COMPARISON:  Ultrasound 04/23/2015 FINDINGS: Lower chest: Trace right-sided pleural effusion. No evidence of pulmonary edema. Trace pericardial fluid/thickening. Native coronary calcifications. Hepatobiliary: Unremarkable appearance of liver. Calcified cholelithiasis. No associated inflammation. No biliary ductal dilation. Pancreas: Unremarkable appearance of the pancreas Spleen: Unremarkable appearance of the spleen. Adrenals/Urinary Tract: Unremarkable right adrenal gland. Left adrenal gland demonstrates a nodule at the superior aspect with intermediate Hounsfield units measuring 14 mm (image 16 series 3). Thickening of the lateral limb inferiorly with intermediate Hounsfield units. Right kidney without hydronephrosis. No nephrolithiasis. Distal right ureteral stone measures 8 mm at the ureterovesical junction. Trace right-sided perinephric stranding. Nodule on lateral cortex of the right kidney measures 11 mm with Hounsfield units measuring 17. Left kidney without hydronephrosis. Mild perinephric stranding. Nodule or lymph node at the hilum of the left kidney measures 16 mm. Unremarkable course of the left ureter. Calcifications of the left renal vasculature. Urinary bladder partially distended with prominent median lobe of the prostate. Stomach/Bowel: Small hiatal hernia. Unremarkable stomach. Unremarkable small bowel. No abnormal distention. Mild stool burden. Normal appendix. Colonic diverticular disease without evidence of acute diverticulitis. Vascular/Lymphatic: Vascular calcifications. No mesenteric or retroperitoneal adenopathy. No inguinal adenopathy. There are small soft tissue nodules/implants along the peritoneum of the abdomen, including right abdomen inferior to segment 6 of the liver, adjacent to the ascending colon, within the mesentery (image 59 of series 3), adjacent to the spleen, lateral to the left kidney. Reproductive: Enlarged prostate with prominent median  lobe and transverse diameter measuring 5.1 cm. Other: None Musculoskeletal: Degenerative changes of the spine. No displaced fracture. Degenerative changes of the hips. IMPRESSION: Right-sided ureterovesical junction stone measures 8 mm. No evidence of right-sided hydronephrosis. If there is concern for ascending urinary tract infection, recommend correlation with urinalysis. There are multiple small soft tissue lesions of the mesenteric/peritoneal surfaces, as above. While these may represent splenosis, the findings are nonspecific, and peritoneal carcinomatosis could have this appearance. Referral for oncologic evaluation is recommended. Bilateral indeterminate renal lesions, including left renal hilar lymph node/implant. Once workup has been initiated, MR imaging may be considered. Indeterminate left adrenal nodule. This may be evaluated further by MRI. Trace right-sided pleural effusion. Trace pericardial fluid/thickening. Native coronary artery disease and aortic atherosclerosis. Aortic Atherosclerosis (ICD10-I70.0). Cholelithiasis. Diverticular disease without evidence of acute diverticulitis. Electronically Signed   By: Corrie Mckusick D.O.   On: 02/25/2018 13:57  Procedures Procedures (including critical care time)  Medications Ordered in ED Medications - No data to display   Initial Impression / Assessment and Plan / ED Course  I have reviewed the triage vital signs and the nursing notes.  Pertinent labs & imaging results that were available during my care of the patient were reviewed by me and considered in my medical decision making (see chart for details).   Patient in emergency department with right-sided pain.  No associated symptoms.  Patient states pain comes and goes, sometimes is worse with movement, sometimes is better when laying on that side.  Labs are really unremarkable, obtained at triage.  Will get CT renal to evaluate for possible kidney stone.  Urinalysis is negative as  well.  3:03 PM Patient CT scan showed right-sided uretrovesicular junction stone measuring 8 mm.  There is no hydronephrosis at this time.  Patient is currently comfortable.  I am wondering if this is causing his colicky symptoms.  There is some concern about possible ascending urinary tract infection, however his urine is absolutely clear with no evidence of infection.  His CT scan also shows multiple soft tissue lesions of the mesenteric and peritoneal surfaces.  Also showed bilateral indeterminate renal lesions, indeterminate left adrenal nodule, trace right-sided pleural effusion and pericardial fluid, coronary artery disease.  This was all discussed with patient, and he will need close outpatient follow-up for further evaluation.  Advised him that he will need an MRI for further evaluation of the lesions.  Patient understood.  He will follow-up with his primary care doctor.  Vitals:   02/25/18 1315 02/25/18 1330 02/25/18 1415 02/25/18 1445  BP: 127/79 (!) 164/85 (!) 149/84 (!) 176/88  Pulse: 72 74 78 72  Resp: (!) 21 (!) 25 19 20   Temp:      TempSrc:      SpO2: 97% 95% 94% 98%     Final Clinical Impressions(s) / ED Diagnoses   Final diagnoses:  Kidney stone  Renal lesion  Peritoneal lesion    ED Discharge Orders         Ordered    traMADol (ULTRAM) 50 MG tablet  Every 6 hours PRN     02/25/18 1513           Jeannett Senior, PA-C 02/25/18 1518    Virgel Manifold, MD 02/27/18 1056

## 2018-02-25 NOTE — Discharge Instructions (Addendum)
You have an 8 mm stone that is probably what is causing your pain.  Please follow-up with urology.  Take Tylenol for pain, tramadol for severe pain.  He also have multiple lesions in your peritoneal cavity as well as both kidneys and adrenal gland, he will need further work-up by her family doctor with an MRI.  These follow-up with your family doctor for further testing.

## 2018-02-28 ENCOUNTER — Other Ambulatory Visit: Payer: Self-pay | Admitting: *Deleted

## 2018-02-28 ENCOUNTER — Ambulatory Visit (HOSPITAL_COMMUNITY): Payer: Medicare Other

## 2018-02-28 ENCOUNTER — Ambulatory Visit (HOSPITAL_COMMUNITY)
Admission: RE | Admit: 2018-02-28 | Discharge: 2018-02-28 | Disposition: A | Discharge status: Home / Self Care | Payer: Medicare Other | Source: Ambulatory Visit | Attending: Cardiology | Admitting: Cardiology

## 2018-02-28 DIAGNOSIS — I739 Peripheral vascular disease, unspecified: Secondary | ICD-10-CM

## 2018-03-03 ENCOUNTER — Other Ambulatory Visit: Payer: Self-pay | Admitting: Urology

## 2018-03-04 NOTE — Patient Instructions (Addendum)
Arthur [name]  03/04/2018   Your procedure is scheduled on: Friday 03/18/2018  Report to [facility] Main  Entrance              Report to admitting at  215  PM    Call this number if you have problems the morning of surgery [phone]   How to Manage Your Diabetes Before and After Surgery  Why is it important to control my blood sugar before and after surgery? . Improving blood sugar levels before and after surgery helps healing and can limit problems. . A way of improving blood sugar control is eating a healthy diet by: o  Eating less sugar and carbohydrates o  Increasing activity/exercise o  Talking with your doctor about reaching your blood sugar goals . High blood sugars (greater than 180 mg/dL) can raise your risk of infections and slow your recovery, so you will need to focus on controlling your diabetes during the weeks before surgery. . Make sure that the doctor who takes care of your diabetes knows about your planned surgery including the date and location.  How do I manage my blood sugar before surgery? . Check your blood sugar at least 4 times a day, starting 2 days before surgery, to make sure that the level is not too high or low. o Check your blood sugar the morning of your surgery when you wake up and every 2 hours until you get to the Short Stay unit. . If your blood sugar is less than 70 mg/dL, you will need to treat for low blood sugar: o Do not take insulin. o Treat a low blood sugar (less than 70 mg/dL) with  cup of clear juice (cranberry or apple), 4 glucose tablets, OR glucose gel. o Recheck blood sugar in 15 minutes after treatment (to make sure it is greater than 70 mg/dL). If your blood sugar is not greater than 70 mg/dL on recheck, call [phone] for further instructions. . Report your blood sugar to the short stay nurse when you get to Short Stay.  . If you are admitted to the hospital after surgery: o Your blood sugar will be  checked by the staff and you will probably be given insulin after surgery (instead of oral diabetes medicines) to make sure you have good blood sugar levels. o The goal for blood sugar control after surgery is 80-180 mg/dL.   WHAT DO I DO ABOUT MY DIABETES MEDICATION?        The day before surgery , Take Humalog Insulin, the morning dose but NO BEDTIME DOSE!  Marland Kitchen Do not take oral diabetes medicines (pills) the morning of surgery.      . If your CBG is greater than 220 mg/dL, you may take  of your sliding scale  . (correction) dose of insulin.       Remember: Do not eat food :After Midnight. May have clear liquids from midnight up until 1015 am then nothing until after surgery!    CLEAR LIQUID DIET   Foods Allowed  Foods Excluded  Coffee and tea, regular and decaf                             liquids that you cannot  Plain Jell-O in any flavor                                             see through such as: Fruit ices (not with fruit pulp)                                     milk, soups, orange juice  Iced Popsicles                                    All solid food Carbonated beverages, regular and diet                                    Cranberry, grape and apple juices Sports drinks like Gatorade Lightly seasoned clear broth or consume(fat free) Sugar, honey syrup  Sample Menu Breakfast                                Lunch                                     Supper Cranberry juice                    Beef broth                            Chicken broth Jell-O                                     Grape juice                           Apple juice Coffee or tea                        Jell-O                                      Popsicle                                                Coffee or tea                        Coffee or tea  _____________________________________________________________________                 Please brush your teeth morning of surgery and  do not swallow water to rinse your mouth!   Take these medicines the morning of surgery with A SIP OF WATER: Metoprolol (Lopressor)  DO NOT TAKE ANY DIABETIC MEDICATIONS DAY OF YOUR SURGERY!                                You may not have any metal on your body including hair pins and              piercings  Do not wear jewelry, make-up, lotions, powders or perfumes, deodorant                        Men may shave face and neck.   Do not bring valuables to the hospital. [place].  Contacts, dentures or bridgework may not be worn into surgery.  Leave suitcase in the car. After surgery it may be brought to your room.     Patients discharged the day of surgery will not be allowed to drive home.  Name and phone number of your driver:                Please read over the following fact sheets you were given: _____________________________________________________________________             [facility] - Preparing for Surgery Before surgery, you can play an important role.  Because skin is not sterile, your skin needs to be as free of germs as possible.  You can reduce the number of germs on your skin by washing with CHG (chlorahexidine gluconate) soap before surgery.  CHG is an antiseptic cleaner which kills germs and bonds with the skin to continue killing germs even after washing. Please DO NOT use if you have an allergy to CHG or antibacterial soaps.  If your skin becomes reddened/irritated stop using the CHG and inform your nurse when you arrive at Short Stay. Do not shave (including legs and underarms) for at least 48 hours prior to the first CHG shower.  You may shave your face/neck. Please follow these instructions carefully:  1.  Shower with CHG Soap the night before surgery and the  morning of Surgery.  2.  If you choose to wash your hair, wash your hair first as usual with your   normal  shampoo.  3.  After you shampoo, rinse your hair and body thoroughly to remove the  shampoo.                           4.  Use CHG as you would any other liquid soap.  You can apply chg directly  to the skin and wash                       Gently with a scrungie or clean washcloth.  5.  Apply the CHG Soap to your body ONLY FROM THE NECK DOWN.   Do not use on face/ open                           Wound or open sores. Avoid contact with eyes, ears mouth and genitals (private parts).  Wash face,  Genitals (private parts) with your normal soap.             6.  Wash thoroughly, paying special attention to the area where your surgery  will be performed.  7.  Thoroughly rinse your body with warm water from the neck down.  8.  DO NOT shower/wash with your normal soap after using and rinsing off  the CHG Soap.                9.  Pat yourself dry with a clean towel.            10.  Wear clean pajamas.            11.  Place clean sheets on your bed the night of your first shower and do not  sleep with pets. Day of Surgery : Do not apply any lotions/deodorants the morning of surgery.  Please wear clean clothes to the hospital/surgery center.  FAILURE TO FOLLOW THESE INSTRUCTIONS MAY RESULT IN THE CANCELLATION OF YOUR SURGERY PATIENT SIGNATURE_________________________________  NURSE SIGNATURE__________________________________  ________________________________________________________________________

## 2018-03-04 NOTE — Progress Notes (Signed)
02/26/2018- noted in [facility]  02/25/2018-labs in Epic-CBC,BMP, U/A , Troponin, and CXR.  02/21/2018- Last office visit on chart from [facility] Lanius, PA on chart.  02/07/2018- labs on CHART- Lipid panel,CMP,HgA1C, CBC w/diff., from [place]  12/17/2017- noted in [facility] note from Dr. Quay Burow

## 2018-03-07 ENCOUNTER — Inpatient Hospital Stay (HOSPITAL_COMMUNITY)
Admission: RE | Admit: 2018-03-07 | Discharge: 2018-03-07 | Disposition: A | Discharge status: Home / Self Care | Payer: Medicare Other | Source: Ambulatory Visit

## 2018-03-08 ENCOUNTER — Inpatient Hospital Stay (HOSPITAL_COMMUNITY): Payer: Medicare Other

## 2018-03-08 ENCOUNTER — Other Ambulatory Visit: Payer: Self-pay

## 2018-03-08 ENCOUNTER — Encounter (HOSPITAL_COMMUNITY): Payer: Self-pay | Admitting: Emergency Medicine

## 2018-03-08 ENCOUNTER — Emergency Department (HOSPITAL_COMMUNITY): Payer: Medicare Other

## 2018-03-08 ENCOUNTER — Inpatient Hospital Stay (HOSPITAL_COMMUNITY)
Admission: EM | Admit: 2018-03-08 | Discharge: 2018-03-16 | DRG: 264 | Disposition: A | Discharge status: Home / Self Care | Payer: Medicare Other | Attending: Internal Medicine | Admitting: Internal Medicine

## 2018-03-08 DIAGNOSIS — R631 Polydipsia: Secondary | ICD-10-CM | POA: Diagnosis not present

## 2018-03-08 DIAGNOSIS — E1122 Type 2 diabetes mellitus with diabetic chronic kidney disease: Secondary | ICD-10-CM | POA: Diagnosis present

## 2018-03-08 DIAGNOSIS — R918 Other nonspecific abnormal finding of lung field: Secondary | ICD-10-CM | POA: Diagnosis not present

## 2018-03-08 DIAGNOSIS — E1151 Type 2 diabetes mellitus with diabetic peripheral angiopathy without gangrene: Secondary | ICD-10-CM | POA: Diagnosis present

## 2018-03-08 DIAGNOSIS — I69354 Hemiplegia and hemiparesis following cerebral infarction affecting left non-dominant side: Secondary | ICD-10-CM

## 2018-03-08 DIAGNOSIS — R590 Localized enlarged lymph nodes: Secondary | ICD-10-CM | POA: Diagnosis not present

## 2018-03-08 DIAGNOSIS — I130 Hypertensive heart and chronic kidney disease with heart failure and stage 1 through stage 4 chronic kidney disease, or unspecified chronic kidney disease: Secondary | ICD-10-CM | POA: Diagnosis not present

## 2018-03-08 DIAGNOSIS — Z8249 Family history of ischemic heart disease and other diseases of the circulatory system: Secondary | ICD-10-CM

## 2018-03-08 DIAGNOSIS — N200 Calculus of kidney: Secondary | ICD-10-CM | POA: Diagnosis not present

## 2018-03-08 DIAGNOSIS — C786 Secondary malignant neoplasm of retroperitoneum and peritoneum: Secondary | ICD-10-CM | POA: Diagnosis present

## 2018-03-08 DIAGNOSIS — N289 Disorder of kidney and ureter, unspecified: Secondary | ICD-10-CM | POA: Insufficient documentation

## 2018-03-08 DIAGNOSIS — Z885 Allergy status to narcotic agent status: Secondary | ICD-10-CM

## 2018-03-08 DIAGNOSIS — I5021 Acute systolic (congestive) heart failure: Secondary | ICD-10-CM | POA: Diagnosis not present

## 2018-03-08 DIAGNOSIS — I10 Essential (primary) hypertension: Secondary | ICD-10-CM | POA: Diagnosis present

## 2018-03-08 DIAGNOSIS — Z9861 Coronary angioplasty status: Secondary | ICD-10-CM

## 2018-03-08 DIAGNOSIS — E119 Type 2 diabetes mellitus without complications: Secondary | ICD-10-CM

## 2018-03-08 DIAGNOSIS — IMO0001 Reserved for inherently not codable concepts without codable children: Secondary | ICD-10-CM

## 2018-03-08 DIAGNOSIS — C7801 Secondary malignant neoplasm of right lung: Secondary | ICD-10-CM | POA: Diagnosis not present

## 2018-03-08 DIAGNOSIS — I214 Non-ST elevation (NSTEMI) myocardial infarction: Secondary | ICD-10-CM | POA: Diagnosis present

## 2018-03-08 DIAGNOSIS — K5900 Constipation, unspecified: Secondary | ICD-10-CM | POA: Diagnosis not present

## 2018-03-08 DIAGNOSIS — I739 Peripheral vascular disease, unspecified: Secondary | ICD-10-CM | POA: Diagnosis present

## 2018-03-08 DIAGNOSIS — E222 Syndrome of inappropriate secretion of antidiuretic hormone: Secondary | ICD-10-CM | POA: Diagnosis not present

## 2018-03-08 DIAGNOSIS — I2510 Atherosclerotic heart disease of native coronary artery without angina pectoris: Secondary | ICD-10-CM | POA: Diagnosis present

## 2018-03-08 DIAGNOSIS — I712 Thoracic aortic aneurysm, without rupture: Secondary | ICD-10-CM | POA: Diagnosis present

## 2018-03-08 DIAGNOSIS — D649 Anemia, unspecified: Secondary | ICD-10-CM | POA: Diagnosis not present

## 2018-03-08 DIAGNOSIS — C3411 Malignant neoplasm of upper lobe, right bronchus or lung: Secondary | ICD-10-CM | POA: Diagnosis present

## 2018-03-08 DIAGNOSIS — N189 Chronic kidney disease, unspecified: Secondary | ICD-10-CM | POA: Diagnosis not present

## 2018-03-08 DIAGNOSIS — Z794 Long term (current) use of insulin: Secondary | ICD-10-CM

## 2018-03-08 DIAGNOSIS — R3915 Urgency of urination: Secondary | ICD-10-CM | POA: Diagnosis present

## 2018-03-08 DIAGNOSIS — N183 Chronic kidney disease, stage 3 (moderate): Secondary | ICD-10-CM | POA: Diagnosis not present

## 2018-03-08 DIAGNOSIS — F17200 Nicotine dependence, unspecified, uncomplicated: Secondary | ICD-10-CM | POA: Diagnosis not present

## 2018-03-08 DIAGNOSIS — E785 Hyperlipidemia, unspecified: Secondary | ICD-10-CM | POA: Diagnosis present

## 2018-03-08 DIAGNOSIS — H9190 Unspecified hearing loss, unspecified ear: Secondary | ICD-10-CM | POA: Diagnosis present

## 2018-03-08 DIAGNOSIS — M1990 Unspecified osteoarthritis, unspecified site: Secondary | ICD-10-CM | POA: Diagnosis not present

## 2018-03-08 DIAGNOSIS — N202 Calculus of kidney with calculus of ureter: Secondary | ICD-10-CM | POA: Diagnosis present

## 2018-03-08 DIAGNOSIS — E279 Disorder of adrenal gland, unspecified: Secondary | ICD-10-CM | POA: Diagnosis present

## 2018-03-08 DIAGNOSIS — N2889 Other specified disorders of kidney and ureter: Secondary | ICD-10-CM | POA: Diagnosis present

## 2018-03-08 DIAGNOSIS — Z7982 Long term (current) use of aspirin: Secondary | ICD-10-CM

## 2018-03-08 DIAGNOSIS — E1140 Type 2 diabetes mellitus with diabetic neuropathy, unspecified: Secondary | ICD-10-CM | POA: Diagnosis present

## 2018-03-08 DIAGNOSIS — R0602 Shortness of breath: Secondary | ICD-10-CM

## 2018-03-08 DIAGNOSIS — J449 Chronic obstructive pulmonary disease, unspecified: Secondary | ICD-10-CM | POA: Diagnosis present

## 2018-03-08 DIAGNOSIS — M899 Disorder of bone, unspecified: Secondary | ICD-10-CM | POA: Diagnosis not present

## 2018-03-08 DIAGNOSIS — Z955 Presence of coronary angioplasty implant and graft: Secondary | ICD-10-CM

## 2018-03-08 DIAGNOSIS — C7971 Secondary malignant neoplasm of right adrenal gland: Secondary | ICD-10-CM | POA: Diagnosis not present

## 2018-03-08 DIAGNOSIS — Z79899 Other long term (current) drug therapy: Secondary | ICD-10-CM

## 2018-03-08 DIAGNOSIS — K6389 Other specified diseases of intestine: Secondary | ICD-10-CM | POA: Diagnosis present

## 2018-03-08 DIAGNOSIS — I313 Pericardial effusion (noninflammatory): Secondary | ICD-10-CM | POA: Diagnosis not present

## 2018-03-08 DIAGNOSIS — I129 Hypertensive chronic kidney disease with stage 1 through stage 4 chronic kidney disease, or unspecified chronic kidney disease: Secondary | ICD-10-CM | POA: Diagnosis not present

## 2018-03-08 DIAGNOSIS — C778 Secondary and unspecified malignant neoplasm of lymph nodes of multiple regions: Secondary | ICD-10-CM | POA: Diagnosis not present

## 2018-03-08 LAB — APTT: aPTT: 39 seconds — ABNORMAL HIGH (ref 24–36)

## 2018-03-08 LAB — COMPREHENSIVE METABOLIC PANEL
ALBUMIN: 2.8 g/dL — AB (ref 3.5–5.0)
ALT: 25 U/L (ref 0–44)
ANION GAP: 8 (ref 5–15)
AST: 40 U/L (ref 15–41)
Alkaline Phosphatase: 49 U/L (ref 38–126)
BUN: 19 mg/dL (ref 8–23)
CO2: 24 mmol/L (ref 22–32)
CREATININE: 1.27 mg/dL — AB (ref 0.61–1.24)
Calcium: 9.1 mg/dL (ref 8.9–10.3)
Chloride: 99 mmol/L (ref 98–111)
GFR calc Af Amer: >60 mL/min (ref >60)
GFR calc non Af Amer: 52 mL/min — ABNORMAL LOW (ref >60)
GLUCOSE: 90 mg/dL (ref 70–99)
Potassium: 3.6 mmol/L (ref 3.5–5.1)
SODIUM: 131 mmol/L — AB (ref 135–145)
Total Bilirubin: 0.7 mg/dL (ref 0.3–1.2)
Total Protein: 6.5 g/dL (ref 6.5–8.1)

## 2018-03-08 LAB — CBG MONITORING, ED: Glucose-Capillary: 121 mg/dL — ABNORMAL HIGH (ref 70–99)

## 2018-03-08 LAB — CBC WITH DIFFERENTIAL/PLATELET
Basophils Absolute: 0 10*3/uL (ref 0.0–0.1)
Basophils Relative: 1 %
EOS PCT: 0 %
Eosinophils Absolute: 0 10*3/uL (ref 0.0–0.7)
HEMATOCRIT: 31.3 % — AB (ref 39.0–52.0)
Hemoglobin: 10.6 g/dL — ABNORMAL LOW (ref 13.0–17.0)
LYMPHS ABS: 1 10*3/uL (ref 0.7–4.0)
LYMPHS PCT: 15 %
MCH: 30.8 pg (ref 26.0–34.0)
MCHC: 33.9 g/dL (ref 30.0–36.0)
MCV: 91 fL (ref 78.0–100.0)
Monocytes Absolute: 0.9 10*3/uL (ref 0.1–1.0)
Monocytes Relative: 14 %
Neutro Abs: 4.4 10*3/uL (ref 1.7–7.7)
Neutrophils Relative %: 70 %
PLATELETS: 296 10*3/uL (ref 150–400)
RBC: 3.44 MIL/uL — AB (ref 4.22–5.81)
RDW: 13.5 % (ref 11.5–15.5)
WBC: 6.3 10*3/uL (ref 4.0–10.5)

## 2018-03-08 LAB — POC OCCULT BLOOD, ED: Fecal Occult Bld: NEGATIVE

## 2018-03-08 LAB — TROPONIN I
Troponin I: 6 ng/mL (ref <0.03)
Troponin I: 5.77 ng/mL (ref <0.03)

## 2018-03-08 LAB — LIPASE, BLOOD: Lipase: 24 U/L (ref 11–51)

## 2018-03-08 LAB — PROTIME-INR
INR: 1.14
PROTHROMBIN TIME: 14.5 s (ref 11.4–15.2)

## 2018-03-08 LAB — BRAIN NATRIURETIC PEPTIDE: B Natriuretic Peptide: 734 pg/mL — ABNORMAL HIGH (ref 0.0–100.0)

## 2018-03-08 LAB — HEMOGLOBIN A1C
HEMOGLOBIN A1C: 7.2 % — AB (ref 4.8–5.6)
Mean Plasma Glucose: 159.94 mg/dL

## 2018-03-08 LAB — GLUCOSE, CAPILLARY: GLUCOSE-CAPILLARY: 163 mg/dL — AB (ref 70–99)

## 2018-03-08 LAB — HEPARIN LEVEL (UNFRACTIONATED)

## 2018-03-08 MED ORDER — ONDANSETRON HCL 4 MG/2ML IJ SOLN: 4.0000 mg | Freq: Four times a day (QID) | INTRAMUSCULAR | Status: DC | PRN

## 2018-03-08 MED ORDER — OSTEO BI-FLEX TRIPLE STRENGTH PO TABS: 1.0000 | ORAL_TABLET | Freq: Two times a day (BID) | ORAL | Status: DC

## 2018-03-08 MED ORDER — INSULIN ASPART 100 UNIT/ML ~~LOC~~ SOLN
0.0000 [IU] | SUBCUTANEOUS | Status: DC
  Administered 2018-03-08: 2 [IU] via SUBCUTANEOUS
  Administered 2018-03-09 (×4): 1 [IU] via SUBCUTANEOUS
  Administered 2018-03-09 – 2018-03-10 (×4): 2 [IU] via SUBCUTANEOUS
  Administered 2018-03-10: 1 [IU] via SUBCUTANEOUS
  Administered 2018-03-10: 0 [IU] via SUBCUTANEOUS
  Administered 2018-03-10 – 2018-03-11 (×3): 2 [IU] via SUBCUTANEOUS
  Administered 2018-03-11 (×2): 1 [IU] via SUBCUTANEOUS
  Administered 2018-03-11: 3 [IU] via SUBCUTANEOUS
  Administered 2018-03-11 – 2018-03-12 (×2): 2 [IU] via SUBCUTANEOUS
  Administered 2018-03-12: 1 [IU] via SUBCUTANEOUS
  Administered 2018-03-12 (×3): 2 [IU] via SUBCUTANEOUS
  Administered 2018-03-13 (×3): 1 [IU] via SUBCUTANEOUS
  Administered 2018-03-13 – 2018-03-14 (×3): 2 [IU] via SUBCUTANEOUS
  Administered 2018-03-14 (×4): 1 [IU] via SUBCUTANEOUS
  Administered 2018-03-15: 2 [IU] via SUBCUTANEOUS
  Administered 2018-03-15 – 2018-03-16 (×6): 1 [IU] via SUBCUTANEOUS
  Administered 2018-03-16: 2 [IU] via SUBCUTANEOUS

## 2018-03-08 MED ORDER — VITAMIN D 1000 UNITS PO TABS
2000.0000 [IU] | ORAL_TABLET | Freq: Every day | ORAL | Status: DC
  Administered 2018-03-09 – 2018-03-16 (×8): 2000 [IU] via ORAL
  Filled 2018-03-08 (×8): qty 2

## 2018-03-08 MED ORDER — ASPIRIN EC 325 MG PO TBEC
325.0000 mg | DELAYED_RELEASE_TABLET | Freq: Every day | ORAL | Status: DC
  Administered 2018-03-09 – 2018-03-15 (×7): 325 mg via ORAL
  Filled 2018-03-08 (×7): qty 1

## 2018-03-08 MED ORDER — ATORVASTATIN CALCIUM 20 MG PO TABS
20.0000 mg | ORAL_TABLET | Freq: Every day | ORAL | Status: DC
  Administered 2018-03-09 – 2018-03-15 (×7): 20 mg via ORAL
  Filled 2018-03-08 (×6): qty 1

## 2018-03-08 MED ORDER — OMEGA-3-ACID ETHYL ESTERS 1 G PO CAPS
2.0000 g | ORAL_CAPSULE | Freq: Every day | ORAL | Status: DC
  Administered 2018-03-09 – 2018-03-16 (×8): 2 g via ORAL
  Filled 2018-03-08 (×9): qty 2

## 2018-03-08 MED ORDER — ASPIRIN 81 MG PO CHEW
324.0000 mg | CHEWABLE_TABLET | Freq: Once | ORAL | Status: AC
  Administered 2018-03-08: 324 mg via ORAL
  Filled 2018-03-08: qty 4

## 2018-03-08 MED ORDER — ACETAMINOPHEN 325 MG PO TABS
650.0000 mg | ORAL_TABLET | ORAL | Status: DC | PRN
  Administered 2018-03-08 – 2018-03-10 (×2): 650 mg via ORAL
  Filled 2018-03-08 (×2): qty 2

## 2018-03-08 MED ORDER — ASPIRIN EC 81 MG PO TBEC: 81.0000 mg | DELAYED_RELEASE_TABLET | Freq: Every day | ORAL | Status: DC

## 2018-03-08 MED ORDER — HEPARIN BOLUS VIA INFUSION
4000.0000 [IU] | Freq: Once | INTRAVENOUS | Status: AC
  Administered 2018-03-08: 4000 [IU] via INTRAVENOUS

## 2018-03-08 MED ORDER — NITROGLYCERIN 0.4 MG SL SUBL: 0.4000 mg | SUBLINGUAL_TABLET | SUBLINGUAL | Status: DC | PRN

## 2018-03-08 MED ORDER — DOXAZOSIN MESYLATE 2 MG PO TABS
3.0000 mg | ORAL_TABLET | Freq: Every day | ORAL | Status: DC
  Administered 2018-03-08 – 2018-03-15 (×8): 3 mg via ORAL
  Filled 2018-03-08 (×4): qty 1
  Filled 2018-03-08: qty 2
  Filled 2018-03-08 (×3): qty 1

## 2018-03-08 MED ORDER — FUROSEMIDE 10 MG/ML IJ SOLN
40.0000 mg | Freq: Once | INTRAMUSCULAR | Status: AC
  Administered 2018-03-08: 40 mg via INTRAVENOUS
  Filled 2018-03-08: qty 4

## 2018-03-08 MED ORDER — TAMSULOSIN HCL 0.4 MG PO CAPS
0.4000 mg | ORAL_CAPSULE | Freq: Every day | ORAL | Status: DC
  Administered 2018-03-08 – 2018-03-16 (×9): 0.4 mg via ORAL
  Filled 2018-03-08 (×9): qty 1

## 2018-03-08 MED ORDER — HEPARIN (PORCINE) IN NACL 100-0.45 UNIT/ML-% IJ SOLN
1450.0000 [IU]/h | INTRAMUSCULAR | Status: DC
  Administered 2018-03-08: 1000 [IU]/h via INTRAVENOUS
  Administered 2018-03-09: 1200 [IU]/h via INTRAVENOUS
  Filled 2018-03-08 (×2): qty 250

## 2018-03-08 MED ORDER — SODIUM CHLORIDE 0.9 % IV BOLUS
500.0000 mL | Freq: Once | INTRAVENOUS | Status: AC
  Administered 2018-03-08: 500 mL via INTRAVENOUS

## 2018-03-08 MED ORDER — FENOFIBRATE 160 MG PO TABS
160.0000 mg | ORAL_TABLET | Freq: Every day | ORAL | Status: DC
  Administered 2018-03-09 – 2018-03-16 (×8): 160 mg via ORAL
  Filled 2018-03-08 (×9): qty 1

## 2018-03-08 MED ORDER — METOPROLOL TARTRATE 50 MG PO TABS
50.0000 mg | ORAL_TABLET | Freq: Two times a day (BID) | ORAL | Status: DC
Start: 2018-03-08 — End: 2018-03-12
  Administered 2018-03-08 – 2018-03-11 (×7): 50 mg via ORAL
  Filled 2018-03-08 (×7): qty 1

## 2018-03-08 MED ORDER — HYDROCODONE-ACETAMINOPHEN 5-325 MG PO TABS
2.0000 | ORAL_TABLET | Freq: Once | ORAL | Status: AC
  Administered 2018-03-08: 2 via ORAL
  Filled 2018-03-08: qty 2

## 2018-03-08 NOTE — ED Notes (Signed)
CRITICAL VALUE ALERT  Critical Value:  Troponin - 6.00  Date & Time Notied:  03/08/18   1253  Provider Notified: Dr Melina Copa  Orders Received/Actions taken:

## 2018-03-08 NOTE — ED Triage Notes (Signed)
Patient brought in by EMS for complaint of shortness of breath x 3 weeks. States he was put on percocet 3 weeks ago and symptoms started then. States shortness of breath is worse with activity.

## 2018-03-08 NOTE — ED Notes (Signed)
Gave patient ice water as requested and approved by MD.

## 2018-03-08 NOTE — ED Notes (Signed)
Called [facility] with bed assignment and for transport to [facility].

## 2018-03-08 NOTE — ED Provider Notes (Signed)
[facility] EMERGENCY DEPARTMENT Provider Note   CSN: [id number] Arrival date & time: 03/08/18  1155     History   Chief Complaint Chief Complaint  Patient presents with  . Shortness of Breath    HPI Arthur [name] is a 77 y.o. male.  He arrives by ambulance with complaint of shortness of breath.  He states it has been worse over the last few days but the triage report said he been complaining of it for 3 weeks.  Michela Pitcher it is bothering him all the time but worse when he is laying down.  He thought it had something to do with the pain medicine he was taking.  On review of his prior notes he was in the emergency department on August 9 here with flank and abdominal pain and found to have a 8 millimeter kidney stone.  Sounds like there were a lot of other findings on his abdominal CT and there was recommendations for him to follow-up with his doctors for further work-up.  He stopped the pain pills a couple of days ago with no change in his shortness of breath.  Says his abdomen still bothers him from time to time.  He has some chest pain with cough.  Is been nonproductive.  No reported fevers although he says he feels hot.  Says he is on some stool softeners because his bowels have been slow.  He said he is urinating a lot.  The history is provided by the patient.  Shortness of Breath  This is a new problem. The current episode started more than 2 days ago. The problem has not changed since onset.Associated symptoms include cough, PND, orthopnea, chest pain and abdominal pain. Pertinent negatives include no fever, no headaches, no rhinorrhea, no sore throat, no neck pain, no sputum production, no hemoptysis, no wheezing, no syncope, no vomiting, no rash, no leg pain and no leg swelling. It is unknown what precipitated the problem. He has tried nothing for the symptoms. The treatment provided no relief. He has had prior ED visits. Associated medical issues include CAD.    Past Medical History:    Diagnosis Date  . Arthritis   . Chronic kidney disease   . Claudication ([place])   . Coronary artery disease 1997   CFX PCI, '97. Mod residual RI and RCA dis.  . Diabetes mellitus ([place])    IDDM  . Dyslipidemia   . Hard of hearing   . History of bronchitis   . History of kidney stones 1958  . HTN (hypertension)   . PAD (peripheral artery disease) ([place])   . Pneumonia   . Tobacco abuse   . Urinary incontinence   . Wears glasses     Patient Active Problem List   Diagnosis Date Noted  . Lacunar stroke ([place]) 01/27/2017  . Left leg weakness 01/27/2017  . Tobacco abuse 01/27/2017  . Atherosclerosis of native arteries of extremities with intermittent claudication, right leg ([place]) 05/13/2016  . PAD (peripheral artery disease) ([place]) 02/19/2016  . Coronary artery disease 09/20/2013  . Essential hypertension 09/20/2013  . Hyperlipidemia 09/20/2013  . Insulin dependent diabetes mellitus ([place]) 09/20/2013  . Claudication ([place]) 09/20/2013    Past Surgical History:  Procedure Laterality Date  . APPENDECTOMY     "busted on me"  . COLONOSCOPY    . CORONARY ANGIOPLASTY  1997   CFX  . ENDARTERECTOMY FEMORAL Right 05/13/2016   iliofemoral endarterectomy with bovine pericardial patch angioplasty  . ENDARTERECTOMY FEMORAL Right 05/13/2016  Procedure: RIGHT ENDARTERECTOMY FEMORAL;  Surgeon: Serafina Mitchell, MD;  Location: [place];  Service: Vascular;  Laterality: Right;  . PATCH ANGIOPLASTY Right 05/13/2016   Procedure: [place];  Surgeon: Serafina Mitchell, MD;  Location: [place];  Service: Vascular;  Laterality: Right;  . PERIPHERAL VASCULAR CATHETERIZATION N/A 02/20/2016   Procedure: Lower Extremity Angiography;  Surgeon: Lorretta Harp, MD;  Location: [place] [facility];  Service: Cardiovascular;  Laterality: N/A;        Home Medications    Prior to Admission medications   Medication Sig Start Date End Date Taking? Authorizing Provider  aspirin 81  MG tablet Take 325 mg by mouth daily.     [provider]  atorvastatin (LIPITOR) 20 MG tablet TAKE 1 TABLET (20 MG TOTAL) BY MOUTH DAILY. Patient taking differently: Take 20 mg by mouth daily at 6 PM.  11/30/17   Lorretta Harp, MD  Cholecalciferol (VITAMIN D) 2000 units CAPS Take 2,000 Units by mouth daily.     [provider]  doxazosin (CARDURA) 1 MG tablet Take 3 mg by mouth at bedtime. Pt takes 3 at bedtime     [provider]  fenofibrate 160 MG tablet TAKE 1 TABLET (160 MG TOTAL) BY MOUTH DAILY. 11/29/17   Lorretta Harp, MD  fish oil-omega-3 fatty acids 1000 MG capsule Take 2 g by mouth daily.     [provider]  insulin lispro (HUMALOG) 100 UNIT/ML injection Inject 45 Units into the skin 2 (two) times daily.     [provider]  meloxicam (MOBIC) 7.5 MG tablet Take 7.5 mg by mouth daily. 02/19/18   [provider]  metoprolol tartrate (LOPRESSOR) 50 MG tablet Take 1 tablet (50 mg total) by mouth 2 (two) times daily. KEEP OV. 10/26/17   Lorretta Harp, MD  Misc Natural Products (OSTEO BI-FLEX TRIPLE STRENGTH) TABS Take 1 tablet by mouth 2 (two) times daily.    [provider]  Multiple Vitamins-Minerals (CENTRUM SILVER PO) Take 1 tablet by mouth daily.    [provider]  oxybutynin (DITROPAN) 5 MG tablet Take 5 mg by mouth daily.     [provider]  traMADol (ULTRAM) 50 MG tablet Take 1 tablet (50 mg total) by mouth every 6 (six) hours as needed. 02/25/18   Jeannett Senior, PA-C    Family History Family History  Problem Relation Age of Onset  . Diabetes Sister   . Arthritis Mother   . Heart disease Father     Social History Social History   Tobacco Use  . Smoking status: Former Smoker    Packs/day: 0.25    Years: 65.00    Pack years: 16.25    Types: Cigarettes  . Smokeless tobacco: Never Used  . Tobacco comment: 05/13/2016 "stopped smoking ~ 1 month ago"  Substance Use Topics  .  Alcohol use: No  . Drug use: No     Allergies   Codeine   Review of Systems Review of Systems  Constitutional: Negative for fever.  HENT: Negative for rhinorrhea and sore throat.   Eyes: Negative for visual disturbance.  Respiratory: Positive for cough and shortness of breath. Negative for hemoptysis, sputum production and wheezing.   Cardiovascular: Positive for chest pain, orthopnea and PND. Negative for leg swelling and syncope.  Gastrointestinal: Positive for abdominal pain. Negative for vomiting.  Genitourinary: Positive for frequency. Negative for dysuria.  Musculoskeletal: Negative for neck pain.  Skin: Negative  for rash.  Neurological: Negative for headaches.     Physical Exam Updated Vital Signs BP (!) 149/81 (BP Location: Left Arm)   Pulse 92   Temp 97.9 F (36.6 C) (Oral)   Resp 20   Ht 5\' 9"  (1.753 m)   Wt 80.7 kg   SpO2 96%   BMI 26.29 kg/m   Physical Exam  Constitutional: He appears well-developed and well-nourished.  HENT:  Head: Normocephalic and atraumatic.  Eyes: Conjunctivae are normal.  Neck: Neck supple.  Cardiovascular: Normal rate and regular rhythm.  No murmur heard. Pulmonary/Chest: Effort normal and breath sounds normal. No respiratory distress.  Abdominal: Soft. There is no tenderness.  Genitourinary: Rectum normal. Rectal exam shows no mass, no tenderness and guaiac negative stool.  Genitourinary Comments: Chaperone was present during exam.   Musculoskeletal: Normal range of motion. He exhibits no edema.       Right lower leg: He exhibits no tenderness.       Left lower leg: He exhibits no tenderness.  Neurological: He is alert.  Skin: Skin is warm and dry. Capillary refill takes less than 2 seconds.  Psychiatric: He has a normal mood and affect.  Nursing note and vitals reviewed.    ED Treatments / Results  Labs (all labs ordered are listed, but only abnormal results are displayed) Labs Reviewed  COMPREHENSIVE METABOLIC  PANEL - Abnormal; Notable for the following components:      Result Value   Sodium 131 (*)    Creatinine, Ser 1.27 (*)    Albumin 2.8 (*)    GFR calc non Af Amer 52 (*)    All other components within normal limits  TROPONIN I - Abnormal; Notable for the following components:   Troponin I 6.00 (*)    All other components within normal limits  CBC WITH DIFFERENTIAL/PLATELET - Abnormal; Notable for the following components:   RBC 3.44 (*)    Hemoglobin 10.6 (*)    HCT 31.3 (*)    All other components within normal limits  BRAIN NATRIURETIC PEPTIDE - Abnormal; Notable for the following components:   B Natriuretic Peptide 734.0 (*)    All other components within normal limits  APTT - Abnormal; Notable for the following components:   aPTT 39 (*)    All other components within normal limits  HEMOGLOBIN A1C - Abnormal; Notable for the following components:   Hgb A1c MFr Bld 7.2 (*)    All other components within normal limits  TROPONIN I - Abnormal; Notable for the following components:   Troponin I 5.77 (*)    All other components within normal limits  HEPARIN LEVEL (UNFRACTIONATED) - Abnormal; Notable for the following components:   Heparin Unfractionated <0.10 (*)    All other components within normal limits  GLUCOSE, CAPILLARY - Abnormal; Notable for the following components:   Glucose-Capillary 163 (*)    All other components within normal limits  GLUCOSE, CAPILLARY - Abnormal; Notable for the following components:   Glucose-Capillary 148 (*)    All other components within normal limits  BASIC METABOLIC PANEL - Abnormal; Notable for the following components:   Sodium 134 (*)    Chloride 97 (*)    Glucose, Bld 112 (*)    Creatinine, Ser 1.29 (*)    GFR calc non Af Amer 51 (*)    GFR calc Af Amer 59 (*)    All other components within normal limits  CBC - Abnormal; Notable for the following components:  RBC 3.73 (*)    Hemoglobin 11.2 (*)    HCT 33.2 (*)    All other  components within normal limits  HEPARIN LEVEL (UNFRACTIONATED) - Abnormal; Notable for the following components:   Heparin Unfractionated 0.10 (*)    All other components within normal limits  TROPONIN I - Abnormal; Notable for the following components:   Troponin I 4.44 (*)    All other components within normal limits  GLUCOSE, CAPILLARY - Abnormal; Notable for the following components:   Glucose-Capillary 129 (*)    All other components within normal limits  LIPID PANEL - Abnormal; Notable for the following components:   Triglycerides 152 (*)    HDL 21 (*)    All other components within normal limits  GLUCOSE, CAPILLARY - Abnormal; Notable for the following components:   Glucose-Capillary 129 (*)    All other components within normal limits  GLUCOSE, CAPILLARY - Abnormal; Notable for the following components:   Glucose-Capillary 125 (*)    All other components within normal limits  CBG MONITORING, ED - Abnormal; Notable for the following components:   Glucose-Capillary 121 (*)    All other components within normal limits  LIPASE, BLOOD  PROTIME-INR  HEPARIN LEVEL (UNFRACTIONATED)  HEPARIN LEVEL (UNFRACTIONATED)  POC OCCULT BLOOD, ED    EKG EKG Interpretation  Date/Time:  Tuesday March 08 2018 12:03:41 EDT Ventricular Rate:  85 PR Interval:    QRS Duration: 103 QT Interval:  395 QTC Calculation: 470 R Axis:   64 Text Interpretation:  Sinus rhythm Probable left atrial enlargement Low voltage, extremity leads new low voltage compared with prior 8/19 Confirmed by Aletta Edouard [phone]) on 03/08/2018 12:13:35 PM Also confirmed by Nat Christen [phone])  on 03/08/2018 5:59:05 PM   Radiology Dg Chest 2 View  Result Date: 03/08/2018 CLINICAL DATA:  Shortness of breath began after beginning pain medicine for kidney stones. The patient reports increase shortness of breath when walking. Former smoker. History of coronary artery disease. EXAM: CHEST - 2 VIEW COMPARISON:  PA and  lateral chest x-ray of February 25, 2018 FINDINGS: The left lung is well-expanded and clear. On the right there is persistent increased density in the inferior aspect of the upper lobe. There is new thickening of the minor fissure and the previously demonstrated trace right pleural effusion has increased in volume. The cardiac silhouette is mildly enlarged. The pulmonary vascularity is normal. There is calcification in the wall of the thoracic aorta. The bony thorax exhibits no acute abnormality. IMPRESSION: Worsening of the small right pleural effusion. Persistent airspace opacity inferiorly in the right upper lobe slightly more conspicuous than on the February 25, 2018 study. This likely reflects pneumonia. A central obstructing lesion is not excluded however. Chest CT scanning is recommended to exclude occult malignancy. Thoracic aortic atherosclerosis. Electronically Signed   By: David  Martinique M.D.   On: 03/08/2018 13:09   Ct Chest Wo Contrast  Result Date: 03/08/2018 CLINICAL DATA:  Shortness of breath for 3 weeks. CT recommended after abnormal chest x-ray. EXAM: CT CHEST WITHOUT CONTRAST TECHNIQUE: Multidetector CT imaging of the chest was performed following the standard protocol without IV contrast. COMPARISON:  Chest x-ray on 03/08/2018 FINDINGS: Cardiovascular: The heart is enlarged. There is a 1.0 centimeter pericardial effusion. There is dense atherosclerotic calcification of the coronary vessels. The thoracic aorta is also partially calcified, with mildly aneurysmal ascending of the aorta measuring 4.0 centimeters. Mediastinum/Nodes: The thyroid is mildly heterogeneous. There is extensive mediastinal and hilar adenopathy. Enlarged  nodes are identified in the LOWER neck bilaterally and in the supraclavicular regions, only partially evaluated. Numerous bulky lymph nodes are identified in the superior mediastinum and RIGHT paratracheal region, largest measuring 2.2 centimeters. Prevascular lymph node is 4.7  x 2.8 centimeters. Precarinal lymph node mass is 3.0 centimeters. Subcarinal mass is 2.3 centimeters. Enlarged RIGHT hilar lymph nodes are identified, associated with narrowing of the RIGHT central airways and soft tissue mass in the RIGHT hilum extends into the RIGHT UPPER lobe, described below. The esophagus is normal in appearance. Lungs/Pleura: Within the RIGHT UPPER lobe, there is nodular mass measuring approximately 5.0 x 2.8 centimeters. Mass is contiguous with RIGHT hilar adenopathy. There is soft tissue in the region of the RIGHT middle lobe, measuring 2.3 x 5.9 centimeters and also contiguous with the hilar adenopathy. There is thickening of the RIGHT minor and major fissures. RIGHT pleural effusion is present. LEFT lung is clear. Upper Abdomen: LEFT adrenal mass is 1.9 centimeters an indeterminate for benign lesion based on Hounsfield units. Numerous nodules are identified in the LEFT UPPER QUADRANT retroperitoneum, largest 1.8 centimeters. There is a mass extending off the UPPER pole of each kidney, not further characterized on this noncontrast exam. On the RIGHT, mass is 1.8 centimeters. On the LEFT, masses 2.1 centimeters. Partially imaged retroperitoneal lymphadenopathy. Numerous layering gallstones are present. Musculoskeletal: There is a comminuted fracture of the RIGHT scapula. There are acute fracture fragments and probable callus, consistent with a subacute injury. In the mid body of the scapula, there is question of a lytic lesion. IMPRESSION: 1. RIGHT UPPER lobe mass measuring 5.0 centimeters and suspicious for bronchogenic carcinoma. 2. Bulky adenopathy in the superior mediastinum, LOWER neck, supraclavicular region, mediastinum, and RIGHT hilar regions. 3. RIGHT pleural effusion and pleural thickening. 4. UPPER abdominal adenopathy/masses. Recommend further evaluation with CT of the abdomen and pelvis. Intravenous contrast is recommended unless contraindicated. Recommend renal protocol if  possible. 5. Pericardial effusion. 6. LEFT adrenal mass 1.9 centimeters. 7. Suspect subacute fracture of the RIGHT scapula which may be a pathologic fracture. 8. Bilateral renal masses are indeterminate. 9. Cholelithiasis. 10.  Aortic Atherosclerosis (ICD10-I70.0). 11. Aortic aneurysm NOS (ICD10-I71.9). Ascending aorta is 4.0 centimeters. Recommend annual imaging followup by CTA or MRA. This recommendation follows 2010 ACCF/AHA/AATS/ACR/ASA/SCA/SCAI/SIR/STS/SVM Guidelines for the Diagnosis and Management of Patients with Thoracic Aortic Disease. Circulation. 2010; 121: [id number] Electronically Signed   By: Nolon Nations M.D.   On: 03/08/2018 19:55    Procedures .Critical Care Performed by: Hayden Rasmussen, MD Authorized by: Hayden Rasmussen, MD   Critical care provider statement:    Critical care time (minutes):  45   Critical care was necessary to treat or prevent imminent or life-threatening deterioration of the following conditions:  Cardiac failure   Critical care was time spent personally by me on the following activities:  Discussions with consultants, evaluation of patient's response to treatment, examination of patient, ordering and performing treatments and interventions, ordering and review of laboratory studies, ordering and review of radiographic studies, pulse oximetry, re-evaluation of patient's condition, obtaining history from patient or surrogate, review of old charts and development of treatment plan with patient or surrogate   I assumed direction of critical care for this patient from another provider in my specialty: no     (including critical care time)  Medications Ordered in ED Medications  sodium chloride 0.9 % bolus 500 mL (has no administration in time range)     EMERGENCY DEPARTMENT [place] CARDIAC EXAM "Study: Limited Ultrasound  of the Heart and Pericardium"  INDICATIONS:Dyspnea Multiple views of the heart and pericardium were obtained in real-time with a  multi-frequency probe.  PERFORMED PE:JYLTEI IMAGES ARCHIVED?: Yes LIMITATIONS:  None VIEWS USED: Subcostal 4 chamber, Parasternal long axis, Parasternal short axis and Apical 4 chamber  INTERPRETATION: Pericardial effusion present - small   Initial Impression / Assessment and Plan / ED Course  I have reviewed the triage vital signs and the nursing notes.  Pertinent labs & imaging results that were available during my care of the patient were reviewed by me and considered in my medical decision making (see chart for details).  Clinical Course as of Mar 08 1314  Tue Mar 09, 6415  5450 77 year old male here with shortness of breath.  Is sats are in the mid 90s on room air.  He appears in no distress.  Lungs are clear.  On review of prior notes it seems like a lot of his doctors, and off on his complaint of shortness of breath some not sure how far from baseline this is.  He is getting chest x-ray EKG screening labs.  His EKG shows new low-voltage compared with one done about a week ago.  Will check bedside ultrasound.   [MB]  1238 Rectal guaiac negative.  Patient's hemoglobin is 10.6 and was 12 last week.   [MB]  3539 Patient saw Dr. Alvester Chou from [facility] cardiology last month.  Sounds like at that time they thought his cardiac disease was stable.  I do not see any cardiac echoes.   [MB]  1225 After lying the patient down to do rectal exam and set him up in the bed he is audibly wheezing.    [MB]  1315 Discussed with Dr. Harl Bowie.  He states he would come evaluate the patient in the department.  Recommends aspirin and heparin per protocol.   [MB]    Clinical Course User Index [MB] Hayden Rasmussen, MD     Final Clinical Impressions(s) / ED Diagnoses   Final diagnoses:  Non Q wave myocardial infarction ([place])  SOB (shortness of breath)    ED Discharge Orders    None       Hayden Rasmussen, MD 03/09/18 1229

## 2018-03-08 NOTE — Progress Notes (Signed)
Patient arrive to 4E room 24 at this time. Telemetry applied and CCMD notified. Patient oriented to room and how to call nurse with any needs.  Emelda Fear, RN

## 2018-03-08 NOTE — ED Notes (Signed)
Emptied urinal of 475 ml urine.

## 2018-03-08 NOTE — Progress Notes (Signed)
ANTICOAGULATION CONSULT NOTE - Initial Consult  Pharmacy Consult for heparin Indication: ACS/STEMI  Allergies  Allergen Reactions  . Codeine Swelling    Patient Measurements: Height: 5\' 9"  (175.3 cm) Weight: 178 lb (80.7 kg) IBW/kg (Calculated) : 70.7 Heparin Dosing Weight: 80.7 kg  Vital Signs: Temp: 97.9 F (36.6 C) (08/20 1200) Temp Source: Oral (08/20 1200) BP: 130/74 (08/20 1300) Pulse Rate: 87 (08/20 1300)  Labs: Recent Labs    03/08/18 1214  HGB 10.6*  HCT 31.3*  PLT 296  CREATININE 1.27*  TROPONINI 6.00*    Estimated Creatinine Clearance: 47.2 mL/min (A) (by C-G formula based on SCr of 1.27 mg/dL (H)).   Medical History: Past Medical History:  Diagnosis Date  . Arthritis   . Chronic kidney disease   . Claudication ([place])   . Coronary artery disease 1997   CFX PCI, '97. Mod residual RI and RCA dis.  . Diabetes mellitus ([place])    IDDM  . Dyslipidemia   . Hard of hearing   . History of bronchitis   . History of kidney stones 1958  . HTN (hypertension)   . PAD (peripheral artery disease) ([place])   . Pneumonia   . Tobacco abuse   . Urinary incontinence   . Wears glasses     Medications:   (Not in a hospital admission)  Assessment: Pharmacy consulted to dose heparin in patient with ACS/STEMI.  Patient's troponin on admission is 6.0. Patient was not on any anticoagulation prior to admission per med rec.  Goal of Therapy:  Heparin level 0.3-0.7 units/ml Monitor platelets by anticoagulation protocol: Yes   Plan:  Give 4000 units bolus x 1 Start heparin infusion at 1000 units/hr Check anti-Xa level in 8 hours and daily while on heparin Continue to monitor H&H and platelets  Revonda Standard Stephanine Reas 03/08/2018,1:46 PM

## 2018-03-08 NOTE — Consult Note (Signed)
Cardiology Admission History and Physical:   Patient ID: Arthur [name]; MRN: [id number]; DOB: [birth year]   Admission date: 03/08/2018  Primary Care Provider: Dione Housekeeper, MD Primary Cardiologist: Dr Quay Burow Primary Electrophysiologist:  na  Chief Complaint: SOB  Patient Profile:   Arthur [name] is a 77 y.o. male with a history of CAD, PAD, DM2, presents with 1 month of shortness of breath, we are consulted by Dr Melina Copa to evaluate symptoms and elevated troponin  History of Present Illness:   Arthur [name] is a 77 yo male history of CAD with prior CPI in 1997 to LCX (residaul 60% ramus and 70% distal RCA), DM2, CKD, PAD (He underwent right iliofemoral endarterectomy with bovine pericardial patch angioplasty by Dr. Trula Slade 05/13/16) , hyperlipidemia, HTN, lacunar CVA. Presents with a 3 week history of SOB. Denies any chest pain. Has had some significant wheezing that has been progressiong. No cough, no fevers or chills. No edmea, has had some orthopnea. Ongoing right sided abdominal pain for last few weeks, at its worst can be 10/10.    Na 131 K 3.6 Cr 1.27 Hgb 10.6 (down from 12.2 11 days ago) WBC 6.3 Plt 296 BNP 734 FOBT neg Trop 6--> CXR small right pleural effusion, questionable pneumonia RUL.  CT Renal stone 02/25/18: nonobstructive stone, multiple soft tissue lesions mesenteric and peritoneal surfaces. May represent splenosis cannot exclude peritoneal carcinmatosis, bilateral indeterminant renal lesions, indeterminate left adrenal nodule EKG SR, no acute ischemic changes     Past Medical History:  Diagnosis Date  . Arthritis   . Chronic kidney disease   . Claudication ([place])   . Coronary artery disease 1997   CFX PCI, '97. Mod residual RI and RCA dis.  . Diabetes mellitus ([place])    IDDM  . Dyslipidemia   . Hard of hearing   . History of bronchitis   . History of kidney stones 1958  . HTN (hypertension)   . PAD (peripheral artery disease) ([place])   . Pneumonia   . Tobacco  abuse   . Urinary incontinence   . Wears glasses     Past Surgical History:  Procedure Laterality Date  . APPENDECTOMY     "busted on me"  . COLONOSCOPY    . CORONARY ANGIOPLASTY  1997   CFX  . ENDARTERECTOMY FEMORAL Right 05/13/2016   iliofemoral endarterectomy with bovine pericardial patch angioplasty  . ENDARTERECTOMY FEMORAL Right 05/13/2016   Procedure: RIGHT ENDARTERECTOMY FEMORAL;  Surgeon: Serafina Mitchell, MD;  Location: [place];  Service: Vascular;  Laterality: Right;  . PATCH ANGIOPLASTY Right 05/13/2016   Procedure: [place];  Surgeon: Serafina Mitchell, MD;  Location: [place];  Service: Vascular;  Laterality: Right;  . PERIPHERAL VASCULAR CATHETERIZATION N/A 02/20/2016   Procedure: Lower Extremity Angiography;  Surgeon: Lorretta Harp, MD;  Location: [place] [facility];  Service: Cardiovascular;  Laterality: N/A;     Medications Prior to Admission: Prior to Admission medications   Medication Sig Start Date End Date Taking? Authorizing Provider  aspirin 81 MG tablet Take 325 mg by mouth daily.     [provider]  atorvastatin (LIPITOR) 20 MG tablet TAKE 1 TABLET (20 MG TOTAL) BY MOUTH DAILY. Patient taking differently: Take 20 mg by mouth daily at 6 PM.  11/30/17   Lorretta Harp, MD  Cholecalciferol (VITAMIN D) 2000 units CAPS Take 2,000 Units by mouth daily.     [provider]  doxazosin (CARDURA) 1 MG tablet Take  3 mg by mouth at bedtime. Pt takes 3 at bedtime     [provider]  fenofibrate 160 MG tablet TAKE 1 TABLET (160 MG TOTAL) BY MOUTH DAILY. 11/29/17   Lorretta Harp, MD  fish oil-omega-3 fatty acids 1000 MG capsule Take 2 g by mouth daily.     [provider]  insulin lispro (HUMALOG) 100 UNIT/ML injection Inject 45 Units into the skin 2 (two) times daily.     [provider]  meloxicam (MOBIC) 7.5 MG tablet Take 7.5 mg by mouth daily. 02/19/18   [provider]    metoprolol tartrate (LOPRESSOR) 50 MG tablet Take 1 tablet (50 mg total) by mouth 2 (two) times daily. KEEP OV. 10/26/17   Lorretta Harp, MD  Misc Natural Products (OSTEO BI-FLEX TRIPLE STRENGTH) TABS Take 1 tablet by mouth 2 (two) times daily.    [provider]  Multiple Vitamins-Minerals (CENTRUM SILVER PO) Take 1 tablet by mouth daily.    [provider]  oxybutynin (DITROPAN) 5 MG tablet Take 5 mg by mouth daily.     [provider]  traMADol (ULTRAM) 50 MG tablet Take 1 tablet (50 mg total) by mouth every 6 (six) hours as needed. 02/25/18   Jeannett Senior, PA-C     Allergies:    Allergies  Allergen Reactions  . Codeine Swelling    Social History:   Social History   Socioeconomic History  . Marital status: Married    Spouse name: Not on file  . Number of children: Not on file  . Years of education: Not on file  . Highest education level: Not on file  Occupational History  . Not on file  Social Needs  . Financial resource strain: Not on file  . Food insecurity:    Worry: Not on file    Inability: Not on file  . Transportation needs:    Medical: Not on file    Non-medical: Not on file  Tobacco Use  . Smoking status: Former Smoker    Packs/day: 0.25    Years: 65.00    Pack years: 16.25    Types: Cigarettes  . Smokeless tobacco: Never Used  . Tobacco comment: 05/13/2016 "stopped smoking ~ 1 month ago"  Substance and Sexual Activity  . Alcohol use: No  . Drug use: No  . Sexual activity: Not on file  Lifestyle  . Physical activity:    Days per week: Not on file    Minutes per session: Not on file  . Stress: Not on file  Relationships  . Social connections:    Talks on phone: Not on file    Gets together: Not on file    Attends religious service: Not on file    Active member of club or organization: Not on file    Attends meetings of clubs or organizations: Not on file    Relationship status: Not on file  . Intimate partner  violence:    Fear of current or ex partner: Not on file    Emotionally abused: Not on file    Physically abused: Not on file    Forced sexual activity: Not on file  Other Topics Concern  . Not on file  Social History Narrative   Retired from [employer]. Married 2 children, 11 grand children, 6 great grand children    Family History:   The patient's family history includes Arthritis in his mother; Diabetes in his sister; Heart disease in his father.  ROS:  Please see the history of present illness.  All other ROS reviewed and negative.     Physical Exam/Data:   Vitals:   03/08/18 1158 03/08/18 1200 03/08/18 1230 03/08/18 1300  BP:  (!) 149/81 (!) 148/84 130/74  Pulse:  92 87 87  Resp:  20 16 (!) 25  Temp:  97.9 F (36.6 C)    TempSrc:  Oral    SpO2:  96% 96% 100%  Weight: 80.7 kg     Height: 5\' 9"  (1.753 m)      No intake or output data in the 24 hours ending 03/08/18 1315 Filed Weights   03/08/18 1158  Weight: 80.7 kg   Body mass index is 26.29 kg/m.  General:  Well nourished, well developed, in no acute distress HEENT: normal Lymph: no adenopathy Neck: elevated JVD Endocrine:  No thryomegaly Cardiac:  normal S1, S2; RRR; no murmur  Lungs:  Mild bilateral crackles.  Abd: soft, nontender, no hepatomegaly  Ext: trace bilaterl edema.  Musculoskeletal:  No deformities, BUE and BLE strength normal and equal Skin: warm and dry  Neuro:  CNs 2-12 intact, no focal abnormalities noted Psych:  Normal affect     Laboratory Data:  Chemistry Recent Labs  Lab 03/08/18 1214  NA 131*  K 3.6  CL 99  CO2 24  GLUCOSE 90  BUN 19  CREATININE 1.27*  CALCIUM 9.1  GFRNONAA 52*  GFRAA >60  ANIONGAP 8    Recent Labs  Lab 03/08/18 1214  PROT 6.5  ALBUMIN 2.8*  AST 40  ALT 25  ALKPHOS 49  BILITOT 0.7   Hematology Recent Labs  Lab 03/08/18 1214  WBC 6.3  RBC 3.44*  HGB 10.6*  HCT 31.3*  MCV 91.0  MCH 30.8  MCHC 33.9  RDW 13.5  PLT 296   Cardiac  Enzymes Recent Labs  Lab 03/08/18 1214  TROPONINI 6.00*   No results for input(s): TROPIPOC in the last 168 hours.  BNP Recent Labs  Lab 03/08/18 1214  BNP 734.0*    DDimer No results for input(s): DDIMER in the last 168 hours.  Radiology/Studies:  Dg Chest 2 View  Result Date: 03/08/2018 CLINICAL DATA:  Shortness of breath began after beginning pain medicine for kidney stones. The patient reports increase shortness of breath when walking. Former smoker. History of coronary artery disease. EXAM: CHEST - 2 VIEW COMPARISON:  PA and lateral chest x-ray of February 25, 2018 FINDINGS: The left lung is well-expanded and clear. On the right there is persistent increased density in the inferior aspect of the upper lobe. There is new thickening of the minor fissure and the previously demonstrated trace right pleural effusion has increased in volume. The cardiac silhouette is mildly enlarged. The pulmonary vascularity is normal. There is calcification in the wall of the thoracic aorta. The bony thorax exhibits no acute abnormality. IMPRESSION: Worsening of the small right pleural effusion. Persistent airspace opacity inferiorly in the right upper lobe slightly more conspicuous than on the February 25, 2018 study. This likely reflects pneumonia. A central obstructing lesion is not excluded however. Chest CT scanning is recommended to exclude occult malignancy. Thoracic aortic atherosclerosis. Electronically Signed   By: David  Martinique M.D.   On: 03/08/2018 13:09    Assessment and Plan:   1. NSTEMI - patient with know CAD with prior LCX stent. Known residual disease to ramus and RCA at that time.  - significant troponin, EKG without acute ischemic changes. Presents with 4 weeks  progression SOB, no chest pain. Has some signs of volume overload - continue home ASA, atorvastatin, lopressor. No ACE/ARB due to renal function and upcoming cath. Started on hep gtt - would plan for cath tomorrow pending Hgb and renal  function. Order echo.  - lasix 40mg  IV x 1 in ER, reasssess need for continued diuresis tomorrow.    2. Abnormal chest xray - 4 weeks of progressing SOB, wheezing. Denies any cough, fevers or chills. WBC 6.3. CXR with RUL opacity worse from 02/25/18 study. Defer consideration for pneumonia to primary team.   3. Anemia - drop in Hgb from 11 days ago, FOBT neg in ER. Follow trend on heparin  4. Abdominal pain - recent CT scan with abnormal findings as reported above, defer to primary team. Ongoing 6/10 pain.    With patients abdominal pain, abnormal abdominal CT, abnormal CXR and possible pneumonia, and recent Hgb drop would ask patient be admitted to medicine at [facility] with cardiology following as consult. If labs stable tomorrow would plan cath.        For questions or updates, please contact [place] Please consult [email or web address] for contact info under Cardiology/STEMI.    Merrily Pew, MD  03/08/2018 1:15 PM

## 2018-03-08 NOTE — ED Notes (Signed)
Patient has audible wheezing after attempting to reposition himself in the bed.

## 2018-03-08 NOTE — H&P (Signed)
History and Physical    Arthur [name] TDD:[id number] DOB: 06-25-1941 DOA: 03/08/2018  PCP: Dione Housekeeper, MD  Patient coming from: Home  Chief Complaint: Shortness of breath  HPI: Arthur [name] is a 77 y.o. male with medical history significant of chronic kidney disease, coronary artery disease, diabetes, hypertension, recent kidney stone comes in with several weeks of dyspnea on exertion and shortness of breath.  He denies any cough.  He denies any lower extremity swelling or pain.  He denies any chest pain.  He denies any PND orthopnea.  He denies any fevers.  He denies any epigastric pain.  He has been trying to pass this kidney stone and is having some flank pain on the right.  Patient found to have a troponin of over 6 and NSTEMI referred for admission for such.  Cardiology seen the patient is recommending transfer to [facility] for heart catheterization in the morning.  Cardiology service at [facility] is been notified by Dr. Harl Bowie here.  Review of Systems: As per HPI otherwise 10 point review of systems negative.   Past Medical History:  Diagnosis Date  . Arthritis   . Chronic kidney disease   . Claudication ([place])   . Coronary artery disease 1997   CFX PCI, '97. Mod residual RI and RCA dis.  . Diabetes mellitus ([place])    IDDM  . Dyslipidemia   . Hard of hearing   . History of bronchitis   . History of kidney stones 1958  . HTN (hypertension)   . PAD (peripheral artery disease) ([place])   . Pneumonia   . Tobacco abuse   . Urinary incontinence   . Wears glasses     Past Surgical History:  Procedure Laterality Date  . APPENDECTOMY     "busted on me"  . COLONOSCOPY    . CORONARY ANGIOPLASTY  1997   CFX  . ENDARTERECTOMY FEMORAL Right 05/13/2016   iliofemoral endarterectomy with bovine pericardial patch angioplasty  . ENDARTERECTOMY FEMORAL Right 05/13/2016   Procedure: RIGHT ENDARTERECTOMY FEMORAL;  Surgeon: Serafina Mitchell, MD;  Location: [place];  Service: Vascular;   Laterality: Right;  . PATCH ANGIOPLASTY Right 05/13/2016   Procedure: [place];  Surgeon: Serafina Mitchell, MD;  Location: [place];  Service: Vascular;  Laterality: Right;  . PERIPHERAL VASCULAR CATHETERIZATION N/A 02/20/2016   Procedure: Lower Extremity Angiography;  Surgeon: Lorretta Harp, MD;  Location: [place] [facility];  Service: Cardiovascular;  Laterality: N/A;     reports that he has quit smoking. His smoking use included cigarettes. He has a 16.25 pack-year smoking history. He has never used smokeless tobacco. He reports that he does not drink alcohol or use drugs.  Allergies  Allergen Reactions  . Codeine Swelling    Family History  Problem Relation Age of Onset  . Diabetes Sister   . Arthritis Mother   . Heart disease Father     Prior to Admission medications   Medication Sig Start Date End Date Taking? Authorizing Provider  aspirin 81 MG tablet Take 325 mg by mouth daily.    Yes [provider]  atorvastatin (LIPITOR) 20 MG tablet TAKE 1 TABLET (20 MG TOTAL) BY MOUTH DAILY. Patient taking differently: Take 20 mg by mouth daily at 6 PM.  11/30/17  Yes Lorretta Harp, MD  Cholecalciferol (VITAMIN D) 2000 units CAPS Take 2,000 Units by mouth daily.    Yes [provider]  doxazosin (CARDURA) 1 MG tablet  Take 3 mg by mouth at bedtime. Pt takes 3 at bedtime    Yes [provider]  fenofibrate 160 MG tablet TAKE 1 TABLET (160 MG TOTAL) BY MOUTH DAILY. 11/29/17  Yes Lorretta Harp, MD  fish oil-omega-3 fatty acids 1000 MG capsule Take 2 g by mouth daily.    Yes [provider]  insulin lispro (HUMALOG) 100 UNIT/ML injection Inject 45 Units into the skin 2 (two) times daily.    Yes [provider]  metoprolol tartrate (LOPRESSOR) 50 MG tablet Take 1 tablet (50 mg total) by mouth 2 (two) times daily. KEEP OV. 10/26/17  Yes Lorretta Harp, MD  Misc Natural Products (OSTEO BI-FLEX TRIPLE STRENGTH)  TABS Take 1 tablet by mouth 2 (two) times daily.   Yes [provider]  Multiple Vitamins-Minerals (CENTRUM SILVER PO) Take 1 tablet by mouth daily.   Yes [provider]  oxybutynin (DITROPAN) 5 MG tablet Take 5 mg by mouth daily.    Yes [provider]    Physical Exam: Vitals:   03/08/18 1400 03/08/18 1430 03/08/18 1500 03/08/18 1600  BP: 137/68 127/79 (!) 144/79 136/75  Pulse: 88 83 89 80  Resp: 20 (!) 25 (!) 23 (!) 24  Temp:      TempSrc:      SpO2: 96% 97% 94% 96%  Weight:      Height:          Constitutional: NAD, calm, comfortable Vitals:   03/08/18 1400 03/08/18 1430 03/08/18 1500 03/08/18 1600  BP: 137/68 127/79 (!) 144/79 136/75  Pulse: 88 83 89 80  Resp: 20 (!) 25 (!) 23 (!) 24  Temp:      TempSrc:      SpO2: 96% 97% 94% 96%  Weight:      Height:       Eyes: PERRL, lids and conjunctivae normal ENMT: Mucous membranes are moist. Posterior pharynx clear of any exudate or lesions.Normal dentition.  Neck: normal, supple, no masses, no thyromegaly Respiratory: clear to auscultation bilaterally, no wheezing, no crackles. Normal respiratory effort. No accessory muscle use.  Cardiovascular: Regular rate and rhythm, no murmurs / rubs / gallops. No extremity edema. 2+ pedal pulses. No carotid bruits.  Abdomen: no tenderness, no masses palpated. No hepatosplenomegaly. Bowel sounds positive.  Musculoskeletal: no clubbing / cyanosis. No joint deformity upper and lower extremities. Good ROM, no contractures. Normal muscle tone.  Skin: no rashes, lesions, ulcers. No induration Neurologic: CN 2-12 grossly intact. Sensation intact, DTR normal. Strength 5/5 in all 4.  Psychiatric: Normal judgment and insight. Alert and oriented x 3. Normal mood.    Labs on Admission: I have personally reviewed following labs and imaging studies  CBC: Recent Labs  Lab 03/08/18 1214  WBC 6.3  NEUTROABS 4.4  HGB 10.6*  HCT 31.3*  MCV 91.0  PLT 220   Basic  Metabolic Panel: Recent Labs  Lab 03/08/18 1214  NA 131*  K 3.6  CL 99  CO2 24  GLUCOSE 90  BUN 19  CREATININE 1.27*  CALCIUM 9.1   GFR: Estimated Creatinine Clearance: 47.2 mL/min (A) (by C-G formula based on SCr of 1.27 mg/dL (H)). Liver Function Tests: Recent Labs  Lab 03/08/18 1214  AST 40  ALT 25  ALKPHOS 49  BILITOT 0.7  PROT 6.5  ALBUMIN 2.8*   Recent Labs  Lab 03/08/18 1214  LIPASE 24   No results for input(s): AMMONIA in the last 168 hours. Coagulation Profile: Recent Labs  Lab 03/08/18 1353  INR 1.14   Cardiac Enzymes: Recent Labs  Lab 03/08/18 1214  TROPONINI 6.00*   BNP (last 3 results) No results for input(s): PROBNP in the last 8760 hours. HbA1C: No results for input(s): HGBA1C in the last 72 hours. CBG: No results for input(s): GLUCAP in the last 168 hours. Lipid Profile: No results for input(s): CHOL, HDL, LDLCALC, TRIG, CHOLHDL, LDLDIRECT in the last 72 hours. Thyroid Function Tests: No results for input(s): TSH, T4TOTAL, FREET4, T3FREE, THYROIDAB in the last 72 hours. Anemia Panel: No results for input(s): VITAMINB12, FOLATE, FERRITIN, TIBC, IRON, RETICCTPCT in the last 72 hours. Urine analysis:    Component Value Date/Time   COLORURINE YELLOW 02/25/2018 [place] 02/25/2018 1157   LABSPEC 1.016 02/25/2018 1157   PHURINE 6.0 02/25/2018 1157   GLUCOSEU 50 (A) 02/25/2018 1157   HGBUR NEGATIVE 02/25/2018 1157   BILIRUBINUR NEGATIVE 02/25/2018 1157   KETONESUR NEGATIVE 02/25/2018 1157   PROTEINUR NEGATIVE 02/25/2018 1157   NITRITE NEGATIVE 02/25/2018 1157   LEUKOCYTESUR NEGATIVE 02/25/2018 1157   Sepsis Labs: !!!!!!!!!!!!!!!!!!!!!!!!!!!!!!!!!!!!!!!!!!!! @LABRCNTIP (procalcitonin:4,lacticidven:4) )No results found for this or any previous visit (from the past 240 hour(s)).   Radiological Exams on Admission: Dg Chest 2 View  Result Date: 03/08/2018 CLINICAL DATA:  Shortness of breath began after beginning pain  medicine for kidney stones. The patient reports increase shortness of breath when walking. Former smoker. History of coronary artery disease. EXAM: CHEST - 2 VIEW COMPARISON:  PA and lateral chest x-ray of February 25, 2018 FINDINGS: The left lung is well-expanded and clear. On the right there is persistent increased density in the inferior aspect of the upper lobe. There is new thickening of the minor fissure and the previously demonstrated trace right pleural effusion has increased in volume. The cardiac silhouette is mildly enlarged. The pulmonary vascularity is normal. There is calcification in the wall of the thoracic aorta. The bony thorax exhibits no acute abnormality. IMPRESSION: Worsening of the small right pleural effusion. Persistent airspace opacity inferiorly in the right upper lobe slightly more conspicuous than on the February 25, 2018 study. This likely reflects pneumonia. A central obstructing lesion is not excluded however. Chest CT scanning is recommended to exclude occult malignancy. Thoracic aortic atherosclerosis. Electronically Signed   By: David  Martinique M.D.   On: 03/08/2018 13:09    EKG: Independently reviewed.  Normal sinus rhythm no acute changes Old chart reviewed Case discussed with Dr. Melina Copa in the ED  Assessment/Plan 77 year old male with dyspnea on exertion comes with NSTEMI Principal Problem:   NSTEMI (non-ST elevated myocardial infarction) (HCC)-placed on aspirin.  Placed on heparin drip.  Troponin over 6.  Patient stable being transferred to [facility] stepdown unit for possible heart catheterization in the morning.  Cardiology service at [facility] has been notified and will see patient on arrival to [facility].  Statin beta-blocker instituted.  Patient with history of prior coronary artery disease in 1997.  Active Problems:   Coronary artery disease-as above    Essential hypertension-continue home meds    Hyperlipidemia-continue statin and check fasting lipid panel in the  morning    Insulin dependent diabetes mellitus (HCC)-hold his home insulin and placed on sliding scale insulin for now    PAD (peripheral artery disease) (HCC)-stable    Kidney stone-start on Flomax, may need urology work-up depending on cardiac evaluation    Mesenteric mass-this is being worked up as an outpatient continue outpatient work-up.     DVT prophylaxis: Heparin  Code Status: Full Family Communication: None Disposition Plan: Days Consults called: Cardiology Admission status: Admission   DAVID,RACHAL A MD [facility]  If 7PM-7AM, please contact night-coverage [email or web address] Password [facility]  03/08/2018, 4:26 PM

## 2018-03-09 ENCOUNTER — Inpatient Hospital Stay (HOSPITAL_COMMUNITY): Payer: Medicare Other

## 2018-03-09 ENCOUNTER — Encounter (HOSPITAL_COMMUNITY)
Admission: EM | Disposition: A | Discharge status: Home / Self Care | Payer: Self-pay | Source: Home / Self Care | Attending: Internal Medicine

## 2018-03-09 DIAGNOSIS — I313 Pericardial effusion (noninflammatory): Secondary | ICD-10-CM

## 2018-03-09 LAB — GLUCOSE, CAPILLARY
GLUCOSE-CAPILLARY: 129 mg/dL — AB (ref 70–99)
GLUCOSE-CAPILLARY: 129 mg/dL — AB (ref 70–99)
GLUCOSE-CAPILLARY: 200 mg/dL — AB (ref 70–99)
Glucose-Capillary: 125 mg/dL — ABNORMAL HIGH (ref 70–99)
Glucose-Capillary: 148 mg/dL — ABNORMAL HIGH (ref 70–99)
Glucose-Capillary: 186 mg/dL — ABNORMAL HIGH (ref 70–99)

## 2018-03-09 LAB — HEMOGLOBIN A1C
Hgb A1c MFr Bld: 7.1 % — ABNORMAL HIGH (ref 4.8–5.6)
Mean Plasma Glucose: 157.07 mg/dL

## 2018-03-09 LAB — BASIC METABOLIC PANEL
Anion gap: 12 (ref 5–15)
BUN: 17 mg/dL (ref 8–23)
CO2: 25 mmol/L (ref 22–32)
Calcium: 9.4 mg/dL (ref 8.9–10.3)
Chloride: 97 mmol/L — ABNORMAL LOW (ref 98–111)
Creatinine, Ser: 1.29 mg/dL — ABNORMAL HIGH (ref 0.61–1.24)
GFR, EST AFRICAN AMERICAN: 59 mL/min — AB (ref >60)
GFR, EST NON AFRICAN AMERICAN: 51 mL/min — AB (ref >60)
Glucose, Bld: 112 mg/dL — ABNORMAL HIGH (ref 70–99)
POTASSIUM: 3.9 mmol/L (ref 3.5–5.1)
SODIUM: 134 mmol/L — AB (ref 135–145)

## 2018-03-09 LAB — LIPID PANEL
Cholesterol: 90 mg/dL (ref 0–200)
HDL: 21 mg/dL — AB (ref >40)
LDL Cholesterol: 39 mg/dL (ref 0–99)
Total CHOL/HDL Ratio: 4.3 RATIO
Triglycerides: 152 mg/dL — ABNORMAL HIGH (ref <150)
VLDL: 30 mg/dL (ref 0–40)

## 2018-03-09 LAB — CBC
HEMATOCRIT: 33.2 % — AB (ref 39.0–52.0)
Hemoglobin: 11.2 g/dL — ABNORMAL LOW (ref 13.0–17.0)
MCH: 30 pg (ref 26.0–34.0)
MCHC: 33.7 g/dL (ref 30.0–36.0)
MCV: 89 fL (ref 78.0–100.0)
PLATELETS: 354 10*3/uL (ref 150–400)
RBC: 3.73 MIL/uL — ABNORMAL LOW (ref 4.22–5.81)
RDW: 13 % (ref 11.5–15.5)
WBC: 6.6 10*3/uL (ref 4.0–10.5)

## 2018-03-09 LAB — ECHOCARDIOGRAM COMPLETE
HEIGHTINCHES: 69 in
Weight: 2846.58 oz

## 2018-03-09 LAB — HEPARIN LEVEL (UNFRACTIONATED)
HEPARIN UNFRACTIONATED: 0.32 [IU]/mL (ref 0.30–0.70)
Heparin Unfractionated: 0.1 IU/mL — ABNORMAL LOW (ref 0.30–0.70)

## 2018-03-09 LAB — TROPONIN I: TROPONIN I: 4.44 ng/mL — AB (ref <0.03)

## 2018-03-09 SURGERY — LEFT HEART CATH AND CORONARY ANGIOGRAPHY
Anesthesia: LOCAL

## 2018-03-09 MED ORDER — HYDROMORPHONE HCL 1 MG/ML IJ SOLN
1.0000 mg | INTRAMUSCULAR | Status: DC | PRN
  Administered 2018-03-11 – 2018-03-16 (×14): 1 mg via INTRAVENOUS
  Filled 2018-03-09 (×14): qty 1

## 2018-03-09 MED ORDER — BISACODYL 5 MG PO TBEC
10.0000 mg | DELAYED_RELEASE_TABLET | Freq: Once | ORAL | Status: AC
  Administered 2018-03-09: 10 mg via ORAL
  Filled 2018-03-09: qty 2

## 2018-03-09 MED ORDER — HEPARIN SODIUM (PORCINE) 5000 UNIT/ML IJ SOLN
5000.0000 [IU] | Freq: Three times a day (TID) | INTRAMUSCULAR | Status: DC
  Administered 2018-03-09 – 2018-03-10 (×2): 5000 [IU] via SUBCUTANEOUS
  Filled 2018-03-09 (×3): qty 1

## 2018-03-09 MED ORDER — HYDROCODONE-ACETAMINOPHEN 5-325 MG PO TABS
1.0000 | ORAL_TABLET | Freq: Four times a day (QID) | ORAL | Status: DC | PRN
  Administered 2018-03-09 – 2018-03-16 (×18): 2 via ORAL
  Filled 2018-03-09 (×18): qty 2

## 2018-03-09 MED ORDER — HEPARIN BOLUS VIA INFUSION
2000.0000 [IU] | Freq: Once | INTRAVENOUS | Status: AC
  Administered 2018-03-09: 2000 [IU] via INTRAVENOUS
  Filled 2018-03-09: qty 2000

## 2018-03-09 MED ORDER — MORPHINE SULFATE (PF) 2 MG/ML IV SOLN: 1.0000 mg | INTRAVENOUS | Status: DC | PRN

## 2018-03-09 NOTE — Care Management Note (Signed)
Case Management Note  Patient Details  Name: Arthur [name] MRN: [id number] Date of Birth: 1940-11-27  Subjective/Objective:           NSTEMI         Action/Plan:  Patient admitted from home for NSTEMI, found to have masses to abd/ lungs. Heart cath on hold, awaiting Dx for masses.  Patient independent prior to admission, lives alone, has wife who is a LTC resident at [facility] He shares that he has been going there to feed her lunch everyday for the last 14 years.  He has two children that live in [place] and a local daughter who works two jobs and would not be able to assist a lot after DC.  Will continue to follow for Dx and treatment plan and assist with DC planning as his needs are determined.   Expected Discharge Date:                  Expected Discharge Plan:  [place]  In-House Referral:     Discharge planning Services  CM Consult  Post Acute Care Choice:    Choice offered to:     DME Arranged:    DME Agency:     HH Arranged:    [place] Agency:     Status of Service:  In process, will continue to follow  If discussed at Long Length of Stay Meetings, dates discussed:    Additional Comments:  Carles Collet, RN 03/09/2018, 2:22 PM

## 2018-03-09 NOTE — Progress Notes (Signed)
[place] for heparin Indication: ACS/STEMI  Allergies  Allergen Reactions  . Codeine Swelling    Patient Measurements: Height: 5\' 9"  (175.3 cm) Weight: 177 lb 14.6 oz (80.7 kg) IBW/kg (Calculated) : 70.7 Heparin Dosing Weight: 80.7 kg  Vital Signs: Temp: 97.6 F (36.4 C) (08/21 0003) Temp Source: Oral (08/21 0003) BP: 139/79 (08/21 0003) Pulse Rate: 76 (08/21 0003)  Labs: Recent Labs    03/08/18 1214 03/08/18 1353 03/08/18 1642 03/08/18 2257  HGB 10.6*  --   --   --   HCT 31.3*  --   --   --   PLT 296  --   --   --   APTT  --  39*  --   --   LABPROT  --  14.5  --   --   INR  --  1.14  --   --   HEPARINUNFRC  --   --   --  <0.10*  CREATININE 1.27*  --   --   --   TROPONINI 6.00*  --  5.77*  --     Estimated Creatinine Clearance: 47.2 mL/min (A) (by C-G formula based on SCr of 1.27 mg/dL (H)).  Assessment: .77 y.o. male with NSTEMI for heparin  Goal of Therapy:  Heparin level 0.3-0.7 units/ml Monitor platelets by anticoagulation protocol: Yes   Plan:  Heparin 2000 units IV bolus, then increase heparin 1200 units/hr Follow-up am labs.   Terita Hejl, [place] Curb 03/09/2018,12:18 AM

## 2018-03-09 NOTE — Progress Notes (Addendum)
Inpatient Diabetes Program Recommendations  AACE/ADA: New Consensus Statement on Inpatient Glycemic Control (2015)  Target Ranges:  Prepandial:   less than 140 mg/dL      Peak postprandial:   less than 180 mg/dL (1-2 hours)      Critically ill patients:  140 - 180 mg/dL   Lab Results  Component Value Date   GLUCAP 125 (H) 03/09/2018   HGBA1C 7.2 (H) 03/08/2018    Review of Glycemic ControlResults for MARVEL, MCPHILLIPS (MRN [id number]) as of 03/09/2018 13:32  Ref. Range 03/08/2018 17:43 03/08/2018 19:56 03/09/2018 00:01 03/09/2018 04:46 03/09/2018 08:20 03/09/2018 11:33  Glucose-Capillary Latest Ref Range: 70 - 99 mg/dL 121 (H) 163 (H) 148 (H) 129 (H) 129 (H) 125 (H)    Diabetes history: Type 2 DM  Outpatient Diabetes medications: Humalog 40 units bid ( called and confirmed with [facility] in [place] that this is home dose/type of insulin) Current orders for Inpatient glycemic control:  Novolog sensitive q 4 hours  Inpatient Diabetes Program Recommendations:   Agree that this is unusual home regimen with only rapid acting insulin.  Currently receiving only Novolog correction q 4 hours.  A1C is within goal.  Will discuss with patient.    Thanks,  Adah Perl, RN, BC-ADM Inpatient Diabetes Coordinator Pager [phone] Spoke with patient.  He states that he does take Humalog 40 units twice a day.  He states that he does sometimes have lows. He checks his blood sugars 2-3 times a day and that blood sugars run "all over everywhere".  We discussed potential need to have a long acting insulin plus short acting to prevent both hypoglycemia and hyperglycemia.  May do better with Humalog 75/25 at d/c but at a much lower dose? Will follow.

## 2018-03-09 NOTE — Progress Notes (Addendum)
Progress Note  Patient Name: Arthur [name] Date of Encounter: 03/09/2018  Primary Cardiologist: Quay Burow, MD   Subjective   Dyspnea not with exertion but with sitting or lying down--has to sit up to catch breath.  + abd pain no chest pain now.  Did have pain all over on admit but primary reason was dyspnea.    Inpatient Medications    Scheduled Meds: . aspirin EC  325 mg Oral Daily  . atorvastatin  20 mg Oral q1800  . cholecalciferol  2,000 Units Oral Daily  . doxazosin  3 mg Oral QHS  . fenofibrate  160 mg Oral Daily  . insulin aspart  0-9 Units Subcutaneous Q4H  . metoprolol tartrate  50 mg Oral BID  . omega-3 acid ethyl esters  2 g Oral Daily  . tamsulosin  0.4 mg Oral Daily   Continuous Infusions: . heparin 1,450 Units/hr (03/09/18 0900)   PRN Meds: acetaminophen, HYDROcodone-acetaminophen, HYDROmorphone (DILAUDID) injection, nitroGLYCERIN, ondansetron (ZOFRAN) IV   Vital Signs    Vitals:   03/09/18 0451 03/09/18 0833 03/09/18 0852 03/09/18 1130  BP: 130/75 (!) 146/78  140/84  Pulse: 83 81 100 78  Resp: 19   20  Temp: 97.6 F (36.4 C)   97.6 F (36.4 C)  TempSrc: Oral   Oral  SpO2: 96% 94%  95%  Weight:      Height:        Intake/Output Summary (Last 24 hours) at 03/09/2018 1220 Last data filed at 03/09/2018 0901 Gross per 24 hour  Intake 434.9 ml  Output 1725 ml  Net -1290.1 ml   Filed Weights   03/08/18 1158 03/08/18 1845  Weight: 80.7 kg 80.7 kg    Telemetry    SR - Personally Reviewed  ECG    No new - Personally Reviewed  Physical Exam   GEN: No acute distress.  While resting in bed some abd pain Neck: No JVD, + nodule 2-3 Cm Rt neck base Cardiac: RRR, no murmurs, rubs, or gallops.  Respiratory: rales Lt base and diminished breath sounds Rt base to auscultation. No wheezes. GI: Soft, nontender, non-distended + ascites MS: No edema; No deformity. Neuro:  Nonfocal  Psych: Normal affect   Labs    Chemistry Recent Labs  Lab  03/08/18 1214 03/09/18 0713  NA 131* 134*  K 3.6 3.9  CL 99 97*  CO2 24 25  GLUCOSE 90 112*  BUN 19 17  CREATININE 1.27* 1.29*  CALCIUM 9.1 9.4  PROT 6.5  --   ALBUMIN 2.8*  --   AST 40  --   ALT 25  --   ALKPHOS 49  --   BILITOT 0.7  --   GFRNONAA 52* 51*  GFRAA >60 59*  ANIONGAP 8 12     Hematology Recent Labs  Lab 03/08/18 1214 03/09/18 0713  WBC 6.3 6.6  RBC 3.44* 3.73*  HGB 10.6* 11.2*  HCT 31.3* 33.2*  MCV 91.0 89.0  MCH 30.8 30.0  MCHC 33.9 33.7  RDW 13.5 13.0  PLT 296 354    Cardiac Enzymes Recent Labs  Lab 03/08/18 1214 03/08/18 1642 03/09/18 0713  TROPONINI 6.00* 5.77* 4.44*   No results for input(s): TROPIPOC in the last 168 hours.   BNP Recent Labs  Lab 03/08/18 1214  BNP 734.0*     DDimer No results for input(s): DDIMER in the last 168 hours.   Radiology    Dg Chest 2 View  Result Date: 03/08/2018 CLINICAL DATA:  Shortness of breath began after beginning pain medicine for kidney stones. The patient reports increase shortness of breath when walking. Former smoker. History of coronary artery disease. EXAM: CHEST - 2 VIEW COMPARISON:  PA and lateral chest x-ray of February 25, 2018 FINDINGS: The left lung is well-expanded and clear. On the right there is persistent increased density in the inferior aspect of the upper lobe. There is new thickening of the minor fissure and the previously demonstrated trace right pleural effusion has increased in volume. The cardiac silhouette is mildly enlarged. The pulmonary vascularity is normal. There is calcification in the wall of the thoracic aorta. The bony thorax exhibits no acute abnormality. IMPRESSION: Worsening of the small right pleural effusion. Persistent airspace opacity inferiorly in the right upper lobe slightly more conspicuous than on the February 25, 2018 study. This likely reflects pneumonia. A central obstructing lesion is not excluded however. Chest CT scanning is recommended to exclude occult  malignancy. Thoracic aortic atherosclerosis. Electronically Signed   By: David  Martinique M.D.   On: 03/08/2018 13:09   Ct Chest Wo Contrast  Result Date: 03/08/2018 CLINICAL DATA:  Shortness of breath for 3 weeks. CT recommended after abnormal chest x-ray. EXAM: CT CHEST WITHOUT CONTRAST TECHNIQUE: Multidetector CT imaging of the chest was performed following the standard protocol without IV contrast. COMPARISON:  Chest x-ray on 03/08/2018 FINDINGS: Cardiovascular: The heart is enlarged. There is a 1.0 centimeter pericardial effusion. There is dense atherosclerotic calcification of the coronary vessels. The thoracic aorta is also partially calcified, with mildly aneurysmal ascending of the aorta measuring 4.0 centimeters. Mediastinum/Nodes: The thyroid is mildly heterogeneous. There is extensive mediastinal and hilar adenopathy. Enlarged nodes are identified in the LOWER neck bilaterally and in the supraclavicular regions, only partially evaluated. Numerous bulky lymph nodes are identified in the superior mediastinum and RIGHT paratracheal region, largest measuring 2.2 centimeters. Prevascular lymph node is 4.7 x 2.8 centimeters. Precarinal lymph node mass is 3.0 centimeters. Subcarinal mass is 2.3 centimeters. Enlarged RIGHT hilar lymph nodes are identified, associated with narrowing of the RIGHT central airways and soft tissue mass in the RIGHT hilum extends into the RIGHT UPPER lobe, described below. The esophagus is normal in appearance. Lungs/Pleura: Within the RIGHT UPPER lobe, there is nodular mass measuring approximately 5.0 x 2.8 centimeters. Mass is contiguous with RIGHT hilar adenopathy. There is soft tissue in the region of the RIGHT middle lobe, measuring 2.3 x 5.9 centimeters and also contiguous with the hilar adenopathy. There is thickening of the RIGHT minor and major fissures. RIGHT pleural effusion is present. LEFT lung is clear. Upper Abdomen: LEFT adrenal mass is 1.9 centimeters an  indeterminate for benign lesion based on Hounsfield units. Numerous nodules are identified in the LEFT UPPER QUADRANT retroperitoneum, largest 1.8 centimeters. There is a mass extending off the UPPER pole of each kidney, not further characterized on this noncontrast exam. On the RIGHT, mass is 1.8 centimeters. On the LEFT, masses 2.1 centimeters. Partially imaged retroperitoneal lymphadenopathy. Numerous layering gallstones are present. Musculoskeletal: There is a comminuted fracture of the RIGHT scapula. There are acute fracture fragments and probable callus, consistent with a subacute injury. In the mid body of the scapula, there is question of a lytic lesion. IMPRESSION: 1. RIGHT UPPER lobe mass measuring 5.0 centimeters and suspicious for bronchogenic carcinoma. 2. Bulky adenopathy in the superior mediastinum, LOWER neck, supraclavicular region, mediastinum, and RIGHT hilar regions. 3. RIGHT pleural effusion and pleural thickening. 4. UPPER abdominal adenopathy/masses. Recommend further evaluation with CT of the  abdomen and pelvis. Intravenous contrast is recommended unless contraindicated. Recommend renal protocol if possible. 5. Pericardial effusion. 6. LEFT adrenal mass 1.9 centimeters. 7. Suspect subacute fracture of the RIGHT scapula which may be a pathologic fracture. 8. Bilateral renal masses are indeterminate. 9. Cholelithiasis. 10.  Aortic Atherosclerosis (ICD10-I70.0). 11. Aortic aneurysm NOS (ICD10-I71.9). Ascending aorta is 4.0 centimeters. Recommend annual imaging followup by CTA or MRA. This recommendation follows 2010 ACCF/AHA/AATS/ACR/ASA/SCA/SCAI/SIR/STS/SVM Guidelines for the Diagnosis and Management of Patients with Thoracic Aortic Disease. Circulation. 2010; 121: [id number] Electronically Signed   By: Nolon Nations M.D.   On: 03/08/2018 19:55    Cardiac Studies   Cardiac cath 1997   Patient Profile     77 y.o. male with a history of CAD with prior PCI to LCX in 1997, last stress in  2012 neg for ischemia, PAD, DM2,HTN, HLD  presents with 1 month of shortness of breath to [facility].  He had elevated troponin and was transferred to [facility] for cardiac cath.  After transfer he had CT of chest with results as above.    Assessment & Plan    NSTEMI with pk troponin of 6.0--troponin is decreasing  Plan for cath but with significantly abnormal CT of chest may need to postpone until all issues addressed.   Unless recurrent chest pain.    Hold off on cath, needs more complete assessment of masses.  From cardiac standpoint he may eat.  But will defer to PCP.    CAD with hx of stent to LCX in 1997 and other non obstructive disease.  Last stress test 2012 neg for ischemia and EF 55%.  Pericardial Effusion 1.0 Cm.on CT of chest - non contrast  This increased since CT of abd. --Echo ordered.    Mild aneurysmal ascending of the aorta measuring 4.0 cm.      Nodular mass Rt upper lobe of lung 5.0 X 2.8 --suspicious for bronchogenic carcinoma.  Also with enlarged lymph nodes See report  Pt aware and needle biopsy has been ordered.   Rt. Pl effusion.  Increased from 02/25/18 with CT of abd for renal stone  Upper abd adenopathy/masses.  CT of abd is recommended.  Lt adrenal mass. Bilateral renal masses.  CKD-3 Cr 1.29  HLD with LDL of 39   DM-2 with hgb A1C of 7.2  Anemia with heme neg stools, HGB up to 11.2 today   Nephrolithiasis. Followed by urology.    For questions or updates, please contact [place] Please consult [email or web address] for contact info under Cardiology/STEMI.      Signed, Cecilie Kicks, NP  03/09/2018, 12:20 PM    Attending Note:   The patient was seen and examined.  Agree with assessment and plan as noted above.  Changes made to the above note as needed.  Patient seen and independently examined with Cecilie Kicks, NP .   We discussed all aspects of the encounter. I agree with the assessment and plan as stated above.  1.  Coronary artery disease: The  patient had a heart catheterization in 1997 which revealed severe moderate to severe coronary artery disease.  He had PCI of his left circumflex artery but still has residual disease in the LAD system and in the right system.  Continues to smoke.   Troponin level was recorded at 600 and has been gradually decreasing.  I suspect that he had a non-ST segment elevation myocardial infarction several weeks ago.  That also corresponds with his symptoms.  He also has been  found to have CT scan that is very suspicious for lung cancer.  He has a right supraclavicular lymph node that is very enlarged and also has lesions in his abdomin that are suspicious for metastases.  I think it is important that we identify the extent of his cancer before proceeding with heart cath.  He is currently pain-free.  We will stop the IV heparin.  We will place him on low-dose heparin for DVT prophylaxis.  For Now we will treat medically.   I have spent a total of 40 minutes with patient reviewing hospital  notes , telemetry, EKGs, labs and examining patient as well as establishing an assessment and plan that was discussed with the patient. > 50% of time was spent in direct patient care.    Thayer Headings, Brooke Bonito., MD, [facility] 03/09/2018, 2:22 PM [address],  [place] Pager [phone]

## 2018-03-09 NOTE — Progress Notes (Signed)
ANTICOAGULATION CONSULT NOTE  Pharmacy Consult:  Heparin Indication: ACS/STEMI  Allergies  Allergen Reactions  . Codeine Swelling    Patient Measurements: Height: 5\' 9"  (175.3 cm) Weight: 177 lb 14.6 oz (80.7 kg) IBW/kg (Calculated) : 70.7 Heparin Dosing Weight: 80 kg  Vital Signs: Temp: 97.6 F (36.4 C) (08/21 0451) Temp Source: Oral (08/21 0451) BP: 146/78 (08/21 0833) Pulse Rate: 100 (08/21 0852)  Labs: Recent Labs    03/08/18 1214 03/08/18 1353 03/08/18 1642 03/08/18 2257 03/09/18 0056 03/09/18 0713  HGB 10.6*  --   --   --   --  11.2*  HCT 31.3*  --   --   --   --  33.2*  PLT 296  --   --   --   --  354  APTT  --  39*  --   --   --   --   LABPROT  --  14.5  --   --   --   --   INR  --  1.14  --   --   --   --   HEPARINUNFRC  --   --   --  <0.10* 0.32 0.10*  CREATININE 1.27*  --   --   --   --  1.29*  TROPONINI 6.00*  --  5.77*  --   --  4.44*    Estimated Creatinine Clearance: 46.4 mL/min (A) (by C-G formula based on SCr of 1.29 mg/dL (H)).  Assessment: .77 y.o. male with NSTEMI to continue on IV heparin.  Heparin level is sub-therapeutic.  No issue with heparin infusion and no bleeding per RN.   Goal of Therapy:  Heparin level 0.3-0.7 units/ml Monitor platelets by anticoagulation protocol: Yes    Plan:  Heparin 2000 units IV x 1, then Increase heparin gtt to 1450 units/hr Check 8 hr heparin level Daily heparin level and CBC   Kijana Cromie D. Mina Marble, PharmD, BCPS, [place] 03/09/2018, 8:58 AM

## 2018-03-09 NOTE — Progress Notes (Signed)
  Echocardiogram 2D Echocardiogram has been performed.  Arthur [name] 03/09/2018, 2:25 PM

## 2018-03-09 NOTE — Progress Notes (Addendum)
PROGRESS NOTE    Arthur [name]  WGN:[id number] DOB: [birth year] DOA: 03/08/2018 PCP: Dione Housekeeper, MD  Brief Narrative: 77 year old male with history of CAD, diabetes mellitus type 2, PAD, long-term tobacco abuse and suspected COPD, CVA presented to the ED with [name] yesterday with right flank pain, suspected to be related to his kidney stone, he sees Dr. Tresa Moore with urology and is supposed to have a urological procedure later this month. -in the ED at [facility] he was found to have a troponin over 6,NSTEMI, subsequently transferred to [facility] for left heart catheterization   Assessment & Plan:   Principal Problem:   NSTEMI (non-ST elevated myocardial infarction) ([place]) -asymptomatic from this at this time, history of PCI to left circumflex in 1997 -admitted with significant troponin elevation and EKG without acute changes -Continue aspirin, Lopressor, atorvastatin, heparin GTT -Please note he also has pericardial effusion on CT, hence use caution with IV heparin, could be contributing to some extent to his troponin elevation but unlikely to cause this degree of troponin rise -Follow-up 2-D echocardiogram -Cardiology following    Right upper lobe lung mass -5 cm, in right upper lobe, given 65+ years of heavy smoking highly suspicious for bronchogenic CA -Discussed with the patient, ordered CT-guided biopsy per radiology -Also had CT abdomen pelvis from 8/9, which notes some peritoneal carcinomatosis type lesions which may be more accessible for biopsy, and increases the chances of this being advanced/stage IV cancer   right flank pain -Has an 8 mm right UVJ calculus -Pain could be coming from here versus peritoneal carcinomatosis -Continue Flomax -Supposed to see Dr. Tresa Moore for surgery in a week, will ask Dr. Tresa Moore to evaluate inpatient especially in light of all the above findings    Diabetes mellitus -home medication lists high-dose lispro twice a day, very unusual  regimen -Check hemoglobin A1c, diabetes coordinator consult, continue sliding scale insulin for now    Tobacco abuse -counseled, unfortunately may be too late now   CKD 2 -stable  DVT prophylaxis: heparin gtt Code Status: full code Family Communication: no family at bedside Disposition Plan:   Consultants:   cardiology   Procedures:   Antimicrobials:    Subjective: -continues to have right flank pain  Objective: Vitals:   03/09/18 0451 03/09/18 0833 03/09/18 0852 03/09/18 1130  BP: 130/75 (!) 146/78  140/84  Pulse: 83 81 100 78  Resp: 19   20  Temp: 97.6 F (36.4 C)   97.6 F (36.4 C)  TempSrc: Oral   Oral  SpO2: 96% 94%  95%  Weight:      Height:        Intake/Output Summary (Last 24 hours) at 03/09/2018 1246 Last data filed at 03/09/2018 1220 Gross per 24 hour  Intake 434.9 ml  Output 1725 ml  Net -1290.1 ml   Filed Weights   03/08/18 1158 03/08/18 1845  Weight: 80.7 kg 80.7 kg    Examination:  General exam: Appears calm and comfortable, no distress  Respiratory system: . Poor air movement, decreased breath sounds at both bases Cardiovascular system: S1 & S2 heard, RRR. Gastrointestinal system: Abdomen is nondistended, soft and nontender.Normal bowel sounds heard. Central nervous system: Alert and oriented. No focal neurological deficits. Extremities: No edema Skin: No rashes, lesions or ulcers Psychiatry: Judgement and insight appear normal. Mood & affect appropriate.     Data Reviewed:   CBC: Recent Labs  Lab 03/08/18 1214 03/09/18 0713  WBC 6.3 6.6  NEUTROABS 4.4  --  HGB 10.6* 11.2*  HCT 31.3* 33.2*  MCV 91.0 89.0  PLT 296 283   Basic Metabolic Panel: Recent Labs  Lab 03/08/18 1214 03/09/18 0713  NA 131* 134*  K 3.6 3.9  CL 99 97*  CO2 24 25  GLUCOSE 90 112*  BUN 19 17  CREATININE 1.27* 1.29*  CALCIUM 9.1 9.4   GFR: Estimated Creatinine Clearance: 46.4 mL/min (A) (by C-G formula based on SCr of 1.29 mg/dL  (H)). Liver Function Tests: Recent Labs  Lab 03/08/18 1214  AST 40  ALT 25  ALKPHOS 49  BILITOT 0.7  PROT 6.5  ALBUMIN 2.8*   Recent Labs  Lab 03/08/18 1214  LIPASE 24   No results for input(s): AMMONIA in the last 168 hours. Coagulation Profile: Recent Labs  Lab 03/08/18 1353  INR 1.14   Cardiac Enzymes: Recent Labs  Lab 03/08/18 1214 03/08/18 1642 03/09/18 0713  TROPONINI 6.00* 5.77* 4.44*   BNP (last 3 results) No results for input(s): PROBNP in the last 8760 hours. HbA1C: Recent Labs    03/08/18 1642  HGBA1C 7.2*   CBG: Recent Labs  Lab 03/08/18 1956 03/09/18 0001 03/09/18 0446 03/09/18 0820 03/09/18 1133  GLUCAP 163* 148* 129* 129* 125*   Lipid Profile: Recent Labs    03/09/18 0713  CHOL 90  HDL 21*  LDLCALC 39  TRIG 152*  CHOLHDL 4.3   Thyroid Function Tests: No results for input(s): TSH, T4TOTAL, FREET4, T3FREE, THYROIDAB in the last 72 hours. Anemia Panel: No results for input(s): VITAMINB12, FOLATE, FERRITIN, TIBC, IRON, RETICCTPCT in the last 72 hours. Urine analysis:    Component Value Date/Time   COLORURINE YELLOW 02/25/2018 [place] 02/25/2018 1157   LABSPEC 1.016 02/25/2018 1157   PHURINE 6.0 02/25/2018 1157   GLUCOSEU 50 (A) 02/25/2018 1157   HGBUR NEGATIVE 02/25/2018 1157   BILIRUBINUR NEGATIVE 02/25/2018 1157   KETONESUR NEGATIVE 02/25/2018 1157   PROTEINUR NEGATIVE 02/25/2018 1157   NITRITE NEGATIVE 02/25/2018 1157   LEUKOCYTESUR NEGATIVE 02/25/2018 1157   Sepsis Labs: @LABRCNTIP (procalcitonin:4,lacticidven:4)  )No results found for this or any previous visit (from the past 240 hour(s)).       Radiology Studies: Dg Chest 2 View  Result Date: 03/08/2018 CLINICAL DATA:  Shortness of breath began after beginning pain medicine for kidney stones. The patient reports increase shortness of breath when walking. Former smoker. History of coronary artery disease. EXAM: CHEST - 2 VIEW COMPARISON:  PA  and lateral chest x-ray of February 25, 2018 FINDINGS: The left lung is well-expanded and clear. On the right there is persistent increased density in the inferior aspect of the upper lobe. There is new thickening of the minor fissure and the previously demonstrated trace right pleural effusion has increased in volume. The cardiac silhouette is mildly enlarged. The pulmonary vascularity is normal. There is calcification in the wall of the thoracic aorta. The bony thorax exhibits no acute abnormality. IMPRESSION: Worsening of the small right pleural effusion. Persistent airspace opacity inferiorly in the right upper lobe slightly more conspicuous than on the February 25, 2018 study. This likely reflects pneumonia. A central obstructing lesion is not excluded however. Chest CT scanning is recommended to exclude occult malignancy. Thoracic aortic atherosclerosis. Electronically Signed   By: David  Martinique M.D.   On: 03/08/2018 13:09   Ct Chest Wo Contrast  Result Date: 03/08/2018 CLINICAL DATA:  Shortness of breath for 3 weeks. CT recommended after abnormal chest x-ray. EXAM: CT CHEST WITHOUT CONTRAST TECHNIQUE: Multidetector  CT imaging of the chest was performed following the standard protocol without IV contrast. COMPARISON:  Chest x-ray on 03/08/2018 FINDINGS: Cardiovascular: The heart is enlarged. There is a 1.0 centimeter pericardial effusion. There is dense atherosclerotic calcification of the coronary vessels. The thoracic aorta is also partially calcified, with mildly aneurysmal ascending of the aorta measuring 4.0 centimeters. Mediastinum/Nodes: The thyroid is mildly heterogeneous. There is extensive mediastinal and hilar adenopathy. Enlarged nodes are identified in the LOWER neck bilaterally and in the supraclavicular regions, only partially evaluated. Numerous bulky lymph nodes are identified in the superior mediastinum and RIGHT paratracheal region, largest measuring 2.2 centimeters. Prevascular lymph node is  4.7 x 2.8 centimeters. Precarinal lymph node mass is 3.0 centimeters. Subcarinal mass is 2.3 centimeters. Enlarged RIGHT hilar lymph nodes are identified, associated with narrowing of the RIGHT central airways and soft tissue mass in the RIGHT hilum extends into the RIGHT UPPER lobe, described below. The esophagus is normal in appearance. Lungs/Pleura: Within the RIGHT UPPER lobe, there is nodular mass measuring approximately 5.0 x 2.8 centimeters. Mass is contiguous with RIGHT hilar adenopathy. There is soft tissue in the region of the RIGHT middle lobe, measuring 2.3 x 5.9 centimeters and also contiguous with the hilar adenopathy. There is thickening of the RIGHT minor and major fissures. RIGHT pleural effusion is present. LEFT lung is clear. Upper Abdomen: LEFT adrenal mass is 1.9 centimeters an indeterminate for benign lesion based on Hounsfield units. Numerous nodules are identified in the LEFT UPPER QUADRANT retroperitoneum, largest 1.8 centimeters. There is a mass extending off the UPPER pole of each kidney, not further characterized on this noncontrast exam. On the RIGHT, mass is 1.8 centimeters. On the LEFT, masses 2.1 centimeters. Partially imaged retroperitoneal lymphadenopathy. Numerous layering gallstones are present. Musculoskeletal: There is a comminuted fracture of the RIGHT scapula. There are acute fracture fragments and probable callus, consistent with a subacute injury. In the mid body of the scapula, there is question of a lytic lesion. IMPRESSION: 1. RIGHT UPPER lobe mass measuring 5.0 centimeters and suspicious for bronchogenic carcinoma. 2. Bulky adenopathy in the superior mediastinum, LOWER neck, supraclavicular region, mediastinum, and RIGHT hilar regions. 3. RIGHT pleural effusion and pleural thickening. 4. UPPER abdominal adenopathy/masses. Recommend further evaluation with CT of the abdomen and pelvis. Intravenous contrast is recommended unless contraindicated. Recommend renal protocol if  possible. 5. Pericardial effusion. 6. LEFT adrenal mass 1.9 centimeters. 7. Suspect subacute fracture of the RIGHT scapula which may be a pathologic fracture. 8. Bilateral renal masses are indeterminate. 9. Cholelithiasis. 10.  Aortic Atherosclerosis (ICD10-I70.0). 11. Aortic aneurysm NOS (ICD10-I71.9). Ascending aorta is 4.0 centimeters. Recommend annual imaging followup by CTA or MRA. This recommendation follows 2010 ACCF/AHA/AATS/ACR/ASA/SCA/SCAI/SIR/STS/SVM Guidelines for the Diagnosis and Management of Patients with Thoracic Aortic Disease. Circulation. 2010; 121: G401-U272 Electronically Signed   By: Nolon Nations M.D.   On: 03/08/2018 19:55        Scheduled Meds: . aspirin EC  325 mg Oral Daily  . atorvastatin  20 mg Oral q1800  . cholecalciferol  2,000 Units Oral Daily  . doxazosin  3 mg Oral QHS  . fenofibrate  160 mg Oral Daily  . insulin aspart  0-9 Units Subcutaneous Q4H  . metoprolol tartrate  50 mg Oral BID  . omega-3 acid ethyl esters  2 g Oral Daily  . tamsulosin  0.4 mg Oral Daily   Continuous Infusions: . heparin 1,450 Units/hr (03/09/18 0900)     LOS: 1 day    Time spent:84min  Domenic Polite, MD [facility] Page via [email or web address], password [email or web address] After 7PM please contact night-coverage  03/09/2018, 12:46 PM

## 2018-03-10 ENCOUNTER — Inpatient Hospital Stay (HOSPITAL_COMMUNITY): Payer: Medicare Other

## 2018-03-10 LAB — CBC
HCT: 31.7 % — ABNORMAL LOW (ref 39.0–52.0)
Hemoglobin: 10.8 g/dL — ABNORMAL LOW (ref 13.0–17.0)
MCH: 30.8 pg (ref 26.0–34.0)
MCHC: 34.1 g/dL (ref 30.0–36.0)
MCV: 90.3 fL (ref 78.0–100.0)
PLATELETS: 364 10*3/uL (ref 150–400)
RBC: 3.51 MIL/uL — AB (ref 4.22–5.81)
RDW: 13.1 % (ref 11.5–15.5)
WBC: 6 10*3/uL (ref 4.0–10.5)

## 2018-03-10 LAB — BASIC METABOLIC PANEL
ANION GAP: 9 (ref 5–15)
BUN: 19 mg/dL (ref 8–23)
CALCIUM: 9.1 mg/dL (ref 8.9–10.3)
CO2: 26 mmol/L (ref 22–32)
Chloride: 97 mmol/L — ABNORMAL LOW (ref 98–111)
Creatinine, Ser: 1.25 mg/dL — ABNORMAL HIGH (ref 0.61–1.24)
GFR, EST NON AFRICAN AMERICAN: 53 mL/min — AB (ref >60)
GLUCOSE: 130 mg/dL — AB (ref 70–99)
POTASSIUM: 3.8 mmol/L (ref 3.5–5.1)
SODIUM: 132 mmol/L — AB (ref 135–145)

## 2018-03-10 LAB — GLUCOSE, CAPILLARY
GLUCOSE-CAPILLARY: 126 mg/dL — AB (ref 70–99)
GLUCOSE-CAPILLARY: 130 mg/dL — AB (ref 70–99)
GLUCOSE-CAPILLARY: 151 mg/dL — AB (ref 70–99)
GLUCOSE-CAPILLARY: 160 mg/dL — AB (ref 70–99)
GLUCOSE-CAPILLARY: 183 mg/dL — AB (ref 70–99)
GLUCOSE-CAPILLARY: 191 mg/dL — AB (ref 70–99)

## 2018-03-10 LAB — LIPID PANEL

## 2018-03-10 LAB — HEPARIN LEVEL (UNFRACTIONATED)

## 2018-03-10 LAB — PROTIME-INR
INR: 1.05
Prothrombin Time: 13.6 seconds (ref 11.4–15.2)

## 2018-03-10 MED ORDER — HEPARIN SODIUM (PORCINE) 5000 UNIT/ML IJ SOLN
5000.0000 [IU] | Freq: Three times a day (TID) | INTRAMUSCULAR | Status: DC
  Administered 2018-03-11 – 2018-03-16 (×16): 5000 [IU] via SUBCUTANEOUS
  Filled 2018-03-10 (×15): qty 1

## 2018-03-10 MED ORDER — MIDAZOLAM HCL 2 MG/2ML IJ SOLN
INTRAMUSCULAR | Status: AC
  Filled 2018-03-10: qty 2

## 2018-03-10 MED ORDER — FENTANYL CITRATE (PF) 100 MCG/2ML IJ SOLN
INTRAMUSCULAR | Status: AC
  Filled 2018-03-10: qty 2

## 2018-03-10 MED ORDER — LIDOCAINE HCL (PF) 1 % IJ SOLN
INTRAMUSCULAR | Status: AC
  Filled 2018-03-10: qty 30

## 2018-03-10 MED ORDER — FENTANYL CITRATE (PF) 100 MCG/2ML IJ SOLN
INTRAMUSCULAR | Status: AC | PRN
  Administered 2018-03-10: 25 ug via INTRAVENOUS

## 2018-03-10 MED ORDER — MIDAZOLAM HCL 2 MG/2ML IJ SOLN
INTRAMUSCULAR | Status: AC | PRN
  Administered 2018-03-10: 1 mg via INTRAVENOUS

## 2018-03-10 NOTE — Progress Notes (Addendum)
Progress Note  Patient Name: Michae Grimley Date of Encounter: 03/10/2018  Primary Cardiologist: Dr. Quay Burow, MD   Subjective   Pt feels good today. No chest pain or SOB, only mild lower back pain from kidney stone.   Inpatient Medications    Scheduled Meds: . aspirin EC  325 mg Oral Daily  . atorvastatin  20 mg Oral q1800  . cholecalciferol  2,000 Units Oral Daily  . doxazosin  3 mg Oral QHS  . fenofibrate  160 mg Oral Daily  . [START ON 03/11/2018] heparin injection (subcutaneous)  5,000 Units Subcutaneous Q8H  . insulin aspart  0-9 Units Subcutaneous Q4H  . metoprolol tartrate  50 mg Oral BID  . omega-3 acid ethyl esters  2 g Oral Daily  . tamsulosin  0.4 mg Oral Daily   Continuous Infusions:  PRN Meds: acetaminophen, HYDROcodone-acetaminophen, HYDROmorphone (DILAUDID) injection, nitroGLYCERIN, ondansetron (ZOFRAN) IV   Vital Signs    Vitals:   03/10/18 0020 03/10/18 0355 03/10/18 0745 03/10/18 0938  BP: 137/78 (!) 143/86 (!) 156/87   Pulse: 81 85 91 90  Resp: (!) 28 (!) 21 20   Temp: (!) 97.5 F (36.4 C) 97.9 F (36.6 C) 97.6 F (36.4 C)   TempSrc: Oral Oral Oral   SpO2: 95% 96% 94%   Weight:      Height:        Intake/Output Summary (Last 24 hours) at 03/10/2018 1106 Last data filed at 03/10/2018 9379 Gross per 24 hour  Intake 658.23 ml  Output 120 ml  Net 538.23 ml   Filed Weights   03/08/18 1158 03/08/18 1845  Weight: 80.7 kg 80.7 kg   Physical Exam   General: Elderly,  NAD Skin: Warm, dry, intact  Head: Normocephalic, atraumatic, clear, moist mucus membranes.   Right supraclavicular enlarged lymph node.  Neck: Negative for carotid bruits. No JVD Lungs:Clear to ausculation bilaterally. No wheezes, rales, or rhonchi. Breathing is unlabored. Cardiovascular: RRR with S1 S2. No murmurs, rubs, gallops, or LV heave appreciated. Abdomen: Soft, non-tender, non-distended with normoactive bowel sounds. No obvious abdominal masses. MSK: Strength  and tone appear normal for age. 5/5 in all extremities Extremities: No edema. No clubbing or cyanosis. DP/PT pulses 2+ bilaterally Neuro: Alert and oriented. No focal deficits. No facial asymmetry. MAE spontaneously. Psych: Responds to questions appropriately with normal affect.    Labs    Chemistry Recent Labs  Lab 03/08/18 1214 03/09/18 0500 03/09/18 0713 03/10/18 0310  NA 131*  --  134* 132*  K 3.6  --  3.9 3.8  CL 99  --  97* 97*  CO2 24  --  25 26  GLUCOSE 90  --  112* 130*  BUN 19  --  17 19  CREATININE 1.27*  --  1.29* 1.25*  CALCIUM 9.1  --  9.4 9.1  PROT 6.5  --   --   --   ALBUMIN 2.8*  --   --   --   AST 40  --   --   --   ALT 25  --   --   --   ALKPHOS 49  --   --   --   BILITOT 0.7  --   --   --   GFRNONAA 52*  --  51* 53*  GFRAA >60  --  59* >60  ANIONGAP 8 PENDING 12 9     Hematology Recent Labs  Lab 03/08/18 1214 03/09/18 0713 03/10/18 0310  WBC  6.3 6.6 6.0  RBC 3.44* 3.73* 3.51*  HGB 10.6* 11.2* 10.8*  HCT 31.3* 33.2* 31.7*  MCV 91.0 89.0 90.3  MCH 30.8 30.0 30.8  MCHC 33.9 33.7 34.1  RDW 13.5 13.0 13.1  PLT 296 354 364    Cardiac Enzymes Recent Labs  Lab 03/08/18 1214 03/08/18 1642 03/09/18 0713  TROPONINI 6.00* 5.77* 4.44*   No results for input(s): TROPIPOC in the last 168 hours.   BNP Recent Labs  Lab 03/08/18 1214  BNP 734.0*     DDimer No results for input(s): DDIMER in the last 168 hours.   Radiology    Dg Chest 2 View  Result Date: 03/08/2018 CLINICAL DATA:  Shortness of breath began after beginning pain medicine for kidney stones. The patient reports increase shortness of breath when walking. Former smoker. History of coronary artery disease. EXAM: CHEST - 2 VIEW COMPARISON:  PA and lateral chest x-ray of February 25, 2018 FINDINGS: The left lung is well-expanded and clear. On the right there is persistent increased density in the inferior aspect of the upper lobe. There is new thickening of the minor fissure and the  previously demonstrated trace right pleural effusion has increased in volume. The cardiac silhouette is mildly enlarged. The pulmonary vascularity is normal. There is calcification in the wall of the thoracic aorta. The bony thorax exhibits no acute abnormality. IMPRESSION: Worsening of the small right pleural effusion. Persistent airspace opacity inferiorly in the right upper lobe slightly more conspicuous than on the February 25, 2018 study. This likely reflects pneumonia. A central obstructing lesion is not excluded however. Chest CT scanning is recommended to exclude occult malignancy. Thoracic aortic atherosclerosis. Electronically Signed   By: David  Martinique M.D.   On: 03/08/2018 13:09   Ct Chest Wo Contrast  Result Date: 03/08/2018 CLINICAL DATA:  Shortness of breath for 3 weeks. CT recommended after abnormal chest x-ray. EXAM: CT CHEST WITHOUT CONTRAST TECHNIQUE: Multidetector CT imaging of the chest was performed following the standard protocol without IV contrast. COMPARISON:  Chest x-ray on 03/08/2018 FINDINGS: Cardiovascular: The heart is enlarged. There is a 1.0 centimeter pericardial effusion. There is dense atherosclerotic calcification of the coronary vessels. The thoracic aorta is also partially calcified, with mildly aneurysmal ascending of the aorta measuring 4.0 centimeters. Mediastinum/Nodes: The thyroid is mildly heterogeneous. There is extensive mediastinal and hilar adenopathy. Enlarged nodes are identified in the LOWER neck bilaterally and in the supraclavicular regions, only partially evaluated. Numerous bulky lymph nodes are identified in the superior mediastinum and RIGHT paratracheal region, largest measuring 2.2 centimeters. Prevascular lymph node is 4.7 x 2.8 centimeters. Precarinal lymph node mass is 3.0 centimeters. Subcarinal mass is 2.3 centimeters. Enlarged RIGHT hilar lymph nodes are identified, associated with narrowing of the RIGHT central airways and soft tissue mass in the  RIGHT hilum extends into the RIGHT UPPER lobe, described below. The esophagus is normal in appearance. Lungs/Pleura: Within the RIGHT UPPER lobe, there is nodular mass measuring approximately 5.0 x 2.8 centimeters. Mass is contiguous with RIGHT hilar adenopathy. There is soft tissue in the region of the RIGHT middle lobe, measuring 2.3 x 5.9 centimeters and also contiguous with the hilar adenopathy. There is thickening of the RIGHT minor and major fissures. RIGHT pleural effusion is present. LEFT lung is clear. Upper Abdomen: LEFT adrenal mass is 1.9 centimeters an indeterminate for benign lesion based on Hounsfield units. Numerous nodules are identified in the LEFT UPPER QUADRANT retroperitoneum, largest 1.8 centimeters. There is a mass extending off the  UPPER pole of each kidney, not further characterized on this noncontrast exam. On the RIGHT, mass is 1.8 centimeters. On the LEFT, masses 2.1 centimeters. Partially imaged retroperitoneal lymphadenopathy. Numerous layering gallstones are present. Musculoskeletal: There is a comminuted fracture of the RIGHT scapula. There are acute fracture fragments and probable callus, consistent with a subacute injury. In the mid body of the scapula, there is question of a lytic lesion. IMPRESSION: 1. RIGHT UPPER lobe mass measuring 5.0 centimeters and suspicious for bronchogenic carcinoma. 2. Bulky adenopathy in the superior mediastinum, LOWER neck, supraclavicular region, mediastinum, and RIGHT hilar regions. 3. RIGHT pleural effusion and pleural thickening. 4. UPPER abdominal adenopathy/masses. Recommend further evaluation with CT of the abdomen and pelvis. Intravenous contrast is recommended unless contraindicated. Recommend renal protocol if possible. 5. Pericardial effusion. 6. LEFT adrenal mass 1.9 centimeters. 7. Suspect subacute fracture of the RIGHT scapula which may be a pathologic fracture. 8. Bilateral renal masses are indeterminate. 9. Cholelithiasis. 10.  Aortic  Atherosclerosis (ICD10-I70.0). 11. Aortic aneurysm NOS (ICD10-I71.9). Ascending aorta is 4.0 centimeters. Recommend annual imaging followup by CTA or MRA. This recommendation follows 2010 ACCF/AHA/AATS/ACR/ASA/SCA/SCAI/SIR/STS/SVM Guidelines for the Diagnosis and Management of Patients with Thoracic Aortic Disease. Circulation. 2010; 121: X211-H417 Electronically Signed   By: Nolon Nations M.D.   On: 03/08/2018 19:55   Telemetry    03/10/18 NSR HR 72 - Personally Reviewed  ECG    No new tracing as of 03/10/18 - Personally Reviewed  Cardiac Studies   Echocardiogram 03/09/18: Study Conclusions  - Left ventricle: The cavity size was normal. Systolic function was   moderately reduced. The estimated ejection fraction was in the   range of 35% to 40%. Diffuse hypokinesis. There is akinesis of   the inferior and inferoseptal myocardium. Doppler parameters are   consistent with abnormal left ventricular relaxation (grade 1   diastolic dysfunction). - Aortic valve: Trileaflet; mildly thickened, mildly calcified   leaflets. - Pericardium, extracardiac: A small pericardial effusion was   identified circumferential to the heart. There was no evidence of   hemodynamic compromise.  Impressions:  - EF is decreased when compared to prior.  Patient Profile     77 y.o. male with a history of CAD with prior PCI to LCX in 1997, last stress in 2012 neg for ischemia, PAD, DM2,HTN, HLD  presents with 1 month of shortness of breath to [facility].  He had elevated troponin and was transferred to [facility] for cardiac cath.  After transfer he had CT of chest with results as above.   Assessment & Plan    1. NSTEMI: -Peak troponin > 6.0>>5.77>4.44 -Plan was initially for cardiac cath however, significantly abnormal chest CT with presence of multiple masses>>>now for cervical lymphadenopathy biopsy and peritoneal lesion biopsy>>susupicious for lung CA with mets  -Echocardiogram with decreased LVEF to  35-40% with akinesis of  the inferior and inferoseptal myocardium and grade 1  diastolic dysfunction -Will re-evaluate after mass identification  -Chest pain free today  -Low dose SQ hep for DVT prophylaxis  -ASA, statin, BB  2. CAD: -Hx of stent to LCX in 1997 and other non-obstructive disease -Last stress test 2012, negative for ischemia -See plan above  3. Pericardial effusion: -1.0 cm nodule on chest CT  -Echocardiogram with   4. Mild ascending aneurysm: -Measures 4.0cm   5. Nodular mass in right upper lobe: -5.0 X 2.8 --suspicious for bronchogenic carcinoma.  Also with enlarged lymph nodes See report   -Pt aware and needle biopsy ordered pre  radiology     6. Right pleural effusion: -Increased from 02/25/18 with CT of abd for renal stone  7. CKD stage III: -Creatinine, 1.25 today>>dopwn from 1.29 yesterday  -Avoid nephrotoxic medications   8. HLD: -LDL, 39  9.DM2: -HbA1c, 7.2 -SSI for glucose control  10. Tobacco use: -Ongoing, encouraged cessation   Signed, Kathyrn Drown NP-C [facility] Pager: [phone] 03/10/2018, 11:06 AM     For questions or updates, please contact   Please consult [email or web address] for contact info under Cardiology/STEMI.  Attending Note:   The patient was seen and examined.  Agree with assessment and plan as noted above.  Changes made to the above note as needed.  Patient seen and independently examined with  Kathyrn Drown. NP .   We discussed all aspects of the encounter. I agree with the assessment and plan as stated above.  1.  Non-ST segment elevation myocardial infarction: His troponins continued to trend downward.  I suspect that he had a myocardial infarction last week.  He is not having any further episodes of chest discomfort.  Has a history of stenting in 1997.  Will need to sort out the extent of his presumed lung cancer before we proceed with any invasive cardiac procedures.  2.  Presumed lung cancer: The patient needs to  have a CT-guided needle biopsy.  He is stable from a cardiac standpoint and is at low risk from a cardiovascular standpoint.    I have spent a total of 40 minutes with patient reviewing hospital  notes , telemetry, EKGs, labs and examining patient as well as establishing an assessment and plan that was discussed with the patient. > 50% of time was spent in direct patient care.    Thayer Headings, Brooke Bonito., MD, [facility] 03/10/2018, 1:52 PM [address]  [place] Pager [phone]

## 2018-03-10 NOTE — Progress Notes (Signed)
    Pt is at low risk for lung mass biopsy with conscious sedation from a cardiac standpoint.     Mertie Moores, MD  03/10/2018 8:51 AM    [address] [place],  [place], [place]  [address] Pager [phone] Phone: [phone]; Fax: [phone]

## 2018-03-10 NOTE — Progress Notes (Signed)
PROGRESS NOTE    Arthur [name]  HYQ:[id number] DOB: [birth year] DOA: 03/08/2018 PCP: Dione Housekeeper, MD  Brief Narrative: 77 year old male with history of CAD, diabetes mellitus type 2, PAD, long-term tobacco abuse and suspected COPD, CVA presented to the ED with [name] yesterday with right flank pain, suspected to be related to his kidney stone, he sees Dr. Tresa Moore with urology and is supposed to have a urological procedure later this month. -in the ED at [facility] he was found to have a troponin over 6,NSTEMI, subsequently transferred to [facility] for left heart catheterization   Assessment & Plan:   Principal Problem:   NSTEMI (non-ST elevated myocardial infarction) ([place]) -asymptomatic, history of PCI to left circumflex in 1997 -admitted with significant troponin elevation and EKG without acute changes, peak troponin was 6, trended down to 4.4 yesterday, suspected ACS event was 2 weeks ago, when he had symptoms at home -Continue aspirin, Lopressor, atorvastatin, off heparin drip now -2-D echocardiogram with drop in EF to 35-40% and inferior wall motion abnormalities -Cardiology following, plan for further evaluation pending workup for large new lung mass    Right upper lobe lung mass -5 cm, in right upper lobe, given 65+ years of heavy smoking highly suspicious for bronchogenic CA -Discussed with the patient, ordered CT-guided biopsy, CT abdomen pelvis from 8/9, which notes some peritoneal carcinomatosis type lesions which may be more accessible for biopsy, and increases the chances of this being advanced/stage IV cancer -appreciate IR assistance, plan for biopsy today   right flank pain -Has an 8 mm right UVJ calculus -Pain could be coming from here versus peritoneal carcinomatosis -Continue Flomax -Supposed to see Dr. Tresa Moore for surgery in a week, will ask Dr. Tresa Moore for input    Diabetes mellitus -home medication lists high-dose lispro twice a day, very unusual  regimen -hemoglobin A1c is 7.1, diabetes coordinator consult, continue sliding scale insulin for now -could transition to insulin 7030 at discharge    Tobacco abuse -counseled, unfortunately may be too late now   CKD 2 -stable  DVT prophylaxis: heparin SQ Code Status: full code Family Communication: no family at bedside Disposition Plan: pending cardiac and malignancy evaluation  Consultants:  Cardiology IR   Procedures:   Antimicrobials:    Subjective: -feels a little better, denies any chest pain or dyspnea, continues to complain of mild right flank pain  Objective: Vitals:   03/10/18 0020 03/10/18 0355 03/10/18 0745 03/10/18 0938  BP: 137/78 (!) 143/86 (!) 156/87   Pulse: 81 85 91 90  Resp: (!) 28 (!) 21 20   Temp: (!) 97.5 F (36.4 C) 97.9 F (36.6 C) 97.6 F (36.4 C)   TempSrc: Oral Oral Oral   SpO2: 95% 96% 94%   Weight:      Height:        Intake/Output Summary (Last 24 hours) at 03/10/2018 1322 Last data filed at 03/10/2018 9528 Gross per 24 hour  Intake 658.23 ml  Output 120 ml  Net 538.23 ml   Filed Weights   03/08/18 1158 03/08/18 1845  Weight: 80.7 kg 80.7 kg    Examination:  Gen: Awake, Alert, Oriented X 3, no distress HEENT: PERRLA, Neck supple, no JVD Lungs: poor air movement CVS: RRR,No Gallops,Rubs or new Murmurs Abd: soft, Non tender, non distended, BS present Extremities: No Cyanosis, Clubbing or edema Skin: no new rashes Psychiatry: Judgement and insight appear normal. Mood & affect appropriate.     Data Reviewed:   CBC: Recent Labs  Lab 03/08/18 1214 03/09/18 0713 03/10/18 0310  WBC 6.3 6.6 6.0  NEUTROABS 4.4  --   --   HGB 10.6* 11.2* 10.8*  HCT 31.3* 33.2* 31.7*  MCV 91.0 89.0 90.3  PLT 296 354 462   Basic Metabolic Panel: Recent Labs  Lab 03/08/18 1214 03/09/18 0713 03/10/18 0310  NA 131* 134* 132*  K 3.6 3.9 3.8  CL 99 97* 97*  CO2 24 25 26   GLUCOSE 90 112* 130*  BUN 19 17 19   CREATININE 1.27* 1.29*  1.25*  CALCIUM 9.1 9.4 9.1   GFR: Estimated Creatinine Clearance: 47.9 mL/min (A) (by C-G formula based on SCr of 1.25 mg/dL (H)). Liver Function Tests: Recent Labs  Lab 03/08/18 1214  AST 40  ALT 25  ALKPHOS 49  BILITOT 0.7  PROT 6.5  ALBUMIN 2.8*   Recent Labs  Lab 03/08/18 1214  LIPASE 24   No results for input(s): AMMONIA in the last 168 hours. Coagulation Profile: Recent Labs  Lab 03/08/18 1353 03/10/18 1024  INR 1.14 1.05   Cardiac Enzymes: Recent Labs  Lab 03/08/18 1214 03/08/18 1642 03/09/18 0713  TROPONINI 6.00* 5.77* 4.44*   BNP (last 3 results) No results for input(s): PROBNP in the last 8760 hours. HbA1C: Recent Labs    03/08/18 1642 03/09/18 1326  HGBA1C 7.2* 7.1*   CBG: Recent Labs  Lab 03/09/18 2116 03/10/18 0017 03/10/18 0351 03/10/18 0741 03/10/18 1133  GLUCAP 186* 126* 130* 160* 183*   Lipid Profile: Recent Labs    03/09/18 0500 03/09/18 0713  CHOL  --  90  HDL  --  21*  LDLCALC PENDING 39  TRIG  --  152*  CHOLHDL PENDING 4.3   Thyroid Function Tests: No results for input(s): TSH, T4TOTAL, FREET4, T3FREE, THYROIDAB in the last 72 hours. Anemia Panel: No results for input(s): VITAMINB12, FOLATE, FERRITIN, TIBC, IRON, RETICCTPCT in the last 72 hours. Urine analysis:    Component Value Date/Time   COLORURINE YELLOW 02/25/2018 [place] 02/25/2018 1157   LABSPEC 1.016 02/25/2018 1157   PHURINE 6.0 02/25/2018 1157   GLUCOSEU 50 (A) 02/25/2018 1157   HGBUR NEGATIVE 02/25/2018 1157   BILIRUBINUR NEGATIVE 02/25/2018 1157   KETONESUR NEGATIVE 02/25/2018 1157   PROTEINUR NEGATIVE 02/25/2018 1157   NITRITE NEGATIVE 02/25/2018 1157   LEUKOCYTESUR NEGATIVE 02/25/2018 1157   Sepsis Labs: @LABRCNTIP (procalcitonin:4,lacticidven:4)  )No results found for this or any previous visit (from the past 240 hour(s)).       Radiology Studies: Ct Chest Wo Contrast  Result Date: 03/08/2018 CLINICAL DATA:   Shortness of breath for 3 weeks. CT recommended after abnormal chest x-ray. EXAM: CT CHEST WITHOUT CONTRAST TECHNIQUE: Multidetector CT imaging of the chest was performed following the standard protocol without IV contrast. COMPARISON:  Chest x-ray on 03/08/2018 FINDINGS: Cardiovascular: The heart is enlarged. There is a 1.0 centimeter pericardial effusion. There is dense atherosclerotic calcification of the coronary vessels. The thoracic aorta is also partially calcified, with mildly aneurysmal ascending of the aorta measuring 4.0 centimeters. Mediastinum/Nodes: The thyroid is mildly heterogeneous. There is extensive mediastinal and hilar adenopathy. Enlarged nodes are identified in the LOWER neck bilaterally and in the supraclavicular regions, only partially evaluated. Numerous bulky lymph nodes are identified in the superior mediastinum and RIGHT paratracheal region, largest measuring 2.2 centimeters. Prevascular lymph node is 4.7 x 2.8 centimeters. Precarinal lymph node mass is 3.0 centimeters. Subcarinal mass is 2.3 centimeters. Enlarged RIGHT hilar lymph nodes are identified, associated with narrowing of  the RIGHT central airways and soft tissue mass in the RIGHT hilum extends into the RIGHT UPPER lobe, described below. The esophagus is normal in appearance. Lungs/Pleura: Within the RIGHT UPPER lobe, there is nodular mass measuring approximately 5.0 x 2.8 centimeters. Mass is contiguous with RIGHT hilar adenopathy. There is soft tissue in the region of the RIGHT middle lobe, measuring 2.3 x 5.9 centimeters and also contiguous with the hilar adenopathy. There is thickening of the RIGHT minor and major fissures. RIGHT pleural effusion is present. LEFT lung is clear. Upper Abdomen: LEFT adrenal mass is 1.9 centimeters an indeterminate for benign lesion based on Hounsfield units. Numerous nodules are identified in the LEFT UPPER QUADRANT retroperitoneum, largest 1.8 centimeters. There is a mass extending off the  UPPER pole of each kidney, not further characterized on this noncontrast exam. On the RIGHT, mass is 1.8 centimeters. On the LEFT, masses 2.1 centimeters. Partially imaged retroperitoneal lymphadenopathy. Numerous layering gallstones are present. Musculoskeletal: There is a comminuted fracture of the RIGHT scapula. There are acute fracture fragments and probable callus, consistent with a subacute injury. In the mid body of the scapula, there is question of a lytic lesion. IMPRESSION: 1. RIGHT UPPER lobe mass measuring 5.0 centimeters and suspicious for bronchogenic carcinoma. 2. Bulky adenopathy in the superior mediastinum, LOWER neck, supraclavicular region, mediastinum, and RIGHT hilar regions. 3. RIGHT pleural effusion and pleural thickening. 4. UPPER abdominal adenopathy/masses. Recommend further evaluation with CT of the abdomen and pelvis. Intravenous contrast is recommended unless contraindicated. Recommend renal protocol if possible. 5. Pericardial effusion. 6. LEFT adrenal mass 1.9 centimeters. 7. Suspect subacute fracture of the RIGHT scapula which may be a pathologic fracture. 8. Bilateral renal masses are indeterminate. 9. Cholelithiasis. 10.  Aortic Atherosclerosis (ICD10-I70.0). 11. Aortic aneurysm NOS (ICD10-I71.9). Ascending aorta is 4.0 centimeters. Recommend annual imaging followup by CTA or MRA. This recommendation follows 2010 ACCF/AHA/AATS/ACR/ASA/SCA/SCAI/SIR/STS/SVM Guidelines for the Diagnosis and Management of Patients with Thoracic Aortic Disease. Circulation. 2010; 121: E454-U981 Electronically Signed   By: Nolon Nations M.D.   On: 03/08/2018 19:55        Scheduled Meds: . aspirin EC  325 mg Oral Daily  . atorvastatin  20 mg Oral q1800  . cholecalciferol  2,000 Units Oral Daily  . doxazosin  3 mg Oral QHS  . fenofibrate  160 mg Oral Daily  . [START ON 03/11/2018] heparin injection (subcutaneous)  5,000 Units Subcutaneous Q8H  . insulin aspart  0-9 Units Subcutaneous Q4H  .  metoprolol tartrate  50 mg Oral BID  . omega-3 acid ethyl esters  2 g Oral Daily  . tamsulosin  0.4 mg Oral Daily   Continuous Infusions:    LOS: 2 days    Time spent:76min    Domenic Polite, MD [facility] Page via [email or web address], password [email or web address] After 7PM please contact night-coverage  03/10/2018, 1:22 PM

## 2018-03-10 NOTE — Progress Notes (Signed)
Patient ID: Arthur [name], male   DOB: 20-Sep-1940, 77 y.o.   MRN: [id number]  IR aware of bx request Dr Laurence Ferrari has seen and reviewed imaging  Rec: PET and OP lung bx Or If need urgently- would bx peritoneal lesion   Will need Cardiac clearance for conscious sedation  MD aware  Will await clearance noted in chart

## 2018-03-10 NOTE — Progress Notes (Signed)
  Progress Note   Date: 03/10/2018  Patient Name: Arthur [name]        MRN#: [id number]   Review of the patient's clinical findings supports the diagnosis of acute systolic heart failure      Carlyle Dolly, MD

## 2018-03-10 NOTE — Procedures (Signed)
US guided core biopsies from a cluster of abnormal right cervical lymph nodes.  5 cores obtained.  Minimal blood loss and no immediate complication.

## 2018-03-10 NOTE — Consult Note (Signed)
Chief Complaint: Patient was seen in consultation today for cervical lymphadenopathy biopsy vs peritoneal lesion biopsy Chief Complaint  Patient presents with  . Shortness of Breath   at the request of Dr Fanny Bien   Supervising Physician: Markus Daft  Patient Status: [facility] - In-pt  History of Present Illness: Mayo Faulk is a 77 y.o. male   Hx CAD; DM; CVA; smoker/COPD Came to [facility] ED with Rt flank pain-- related to known renal stone (was scheduled for stone procedure with Dr Tresa Moore later in month) Work up revealed elevated troponins- +NSTEMI and transferred to [facility]  Cardiology work up complete  New RUL mass; carcinomatosis type peritoneal lesions; cervical LAN CT 8/20:  IMPRESSION: 1. RIGHT UPPER lobe mass measuring 5.0 centimeters and suspicious for bronchogenic carcinoma. 2. Bulky adenopathy in the superior mediastinum, LOWER neck, supraclavicular region, mediastinum, and RIGHT hilar regions. 3. RIGHT pleural effusion and pleural thickening. 4. UPPER abdominal adenopathy/masses. Recommend further evaluation with CT of the abdomen and pelvis. Intravenous contrast is recommended unless contraindicated. Recommend renal protocol if possible. 5. Pericardial effusion. 6. LEFT adrenal mass 1.9 centimeters. 7. Suspect subacute fracture of the RIGHT scapula which may be a pathologic fracture. 8. Bilateral renal masses are indeterminate.  Request for biopsy while inpt Worrisome for lymphoma  Dr Anselm Pancoast has reviewed imaging and approves procedure: Cervical LAN bx vs Peritoneal lesion bx  Past Medical History:  Diagnosis Date  . Arthritis   . Chronic kidney disease   . Claudication ([place])   . Coronary artery disease 1997   CFX PCI, '97. Mod residual RI and RCA dis.  . Diabetes mellitus ([place])    IDDM  . Dyslipidemia   . Hard of hearing   . History of bronchitis   . History of kidney stones 1958  . HTN (hypertension)   . PAD (peripheral artery disease) ([place])   . Pneumonia    . Tobacco abuse   . Urinary incontinence   . Wears glasses     Past Surgical History:  Procedure Laterality Date  . APPENDECTOMY     "busted on me"  . COLONOSCOPY    . CORONARY ANGIOPLASTY  1997   CFX  . ENDARTERECTOMY FEMORAL Right 05/13/2016   iliofemoral endarterectomy with bovine pericardial patch angioplasty  . ENDARTERECTOMY FEMORAL Right 05/13/2016   Procedure: RIGHT ENDARTERECTOMY FEMORAL;  Surgeon: Serafina Mitchell, MD;  Location: [facility];  Service: Vascular;  Laterality: Right;  . PATCH ANGIOPLASTY Right 05/13/2016   Procedure: [place];  Surgeon: Serafina Mitchell, MD;  Location: [facility];  Service: Vascular;  Laterality: Right;  . PERIPHERAL VASCULAR CATHETERIZATION N/A 02/20/2016   Procedure: Lower Extremity Angiography;  Surgeon: Lorretta Harp, MD;  Location: [facility];  Service: Cardiovascular;  Laterality: N/A;    Allergies: Codeine  Medications: Prior to Admission medications   Medication Sig Start Date End Date Taking? Authorizing Provider  aspirin 81 MG tablet Take 325 mg by mouth daily.    Yes [provider]  atorvastatin (LIPITOR) 20 MG tablet TAKE 1 TABLET (20 MG TOTAL) BY MOUTH DAILY. Patient taking differently: Take 20 mg by mouth daily at 6 PM.  11/30/17  Yes Lorretta Harp, MD  Cholecalciferol (VITAMIN D) 2000 units CAPS Take 2,000 Units by mouth daily.    Yes [provider]  doxazosin (CARDURA) 1 MG tablet Take 3 mg by mouth at bedtime. Pt takes 3 at bedtime    Yes [provider]  fenofibrate 160 MG tablet TAKE 1 TABLET (160 MG TOTAL) BY MOUTH DAILY. 11/29/17  Yes Lorretta Harp, MD  fish oil-omega-3 fatty acids 1000 MG capsule Take 2 g by mouth daily.    Yes [provider]  insulin lispro (HUMALOG) 100 UNIT/ML injection Inject 45 Units into the skin 2 (two) times daily.    Yes [provider]  metoprolol tartrate (LOPRESSOR) 50 MG tablet Take 1 tablet (50 mg  total) by mouth 2 (two) times daily. KEEP OV. 10/26/17  Yes Lorretta Harp, MD  Misc Natural Products (OSTEO BI-FLEX TRIPLE STRENGTH) TABS Take 1 tablet by mouth 2 (two) times daily.   Yes [provider]  Multiple Vitamins-Minerals (CENTRUM SILVER PO) Take 1 tablet by mouth daily.   Yes [provider]  oxybutynin (DITROPAN) 5 MG tablet Take 5 mg by mouth daily.    Yes [provider]     Family History  Problem Relation Age of Onset  . Diabetes Sister   . Arthritis Mother   . Heart disease Father     Social History   Socioeconomic History  . Marital status: Married    Spouse name: Not on file  . Number of children: Not on file  . Years of education: Not on file  . Highest education level: Not on file  Occupational History  . Not on file  Social Needs  . Financial resource strain: Not on file  . Food insecurity:    Worry: Not on file    Inability: Not on file  . Transportation needs:    Medical: Not on file    Non-medical: Not on file  Tobacco Use  . Smoking status: Former Smoker    Packs/day: 0.25    Years: 65.00    Pack years: 16.25    Types: Cigarettes  . Smokeless tobacco: Never Used  . Tobacco comment: 05/13/2016 "stopped smoking ~ 1 month ago"  Substance and Sexual Activity  . Alcohol use: No  . Drug use: No  . Sexual activity: Not on file  Lifestyle  . Physical activity:    Days per week: Not on file    Minutes per session: Not on file  . Stress: Not on file  Relationships  . Social connections:    Talks on phone: Not on file    Gets together: Not on file    Attends religious service: Not on file    Active member of club or organization: Not on file    Attends meetings of clubs or organizations: Not on file    Relationship status: Not on file  Other Topics Concern  . Not on file  Social History Narrative   Retired from [employer]. Married 2 children, 11 grand children, 6 great grand children    Review of Systems: A 12  point ROS discussed and pertinent positives are indicated in the HPI above.  All other systems are negative.  Review of Systems  Constitutional: Positive for activity change, appetite change and fatigue. Negative for fever.  Respiratory: Negative for cough and shortness of breath.   Cardiovascular: Negative for chest pain.  Gastrointestinal: Positive for abdominal pain.  Musculoskeletal: Negative for gait problem.  Neurological: Positive for weakness.  Psychiatric/Behavioral: Negative for confusion.    Vital Signs: BP (!) 156/87 (BP Location: Left Arm)   Pulse 90   Temp 97.6 F (36.4 C) (Oral)   Resp 20   Ht 5\' 9"  (1.753 m)   Wt 177 lb 14.6 oz (80.7  kg)   SpO2 94%   BMI 26.27 kg/m   Physical Exam  Constitutional: He is oriented to person, place, and time.  Cardiovascular: Normal rate and regular rhythm.  Pulmonary/Chest: Effort normal and breath sounds normal.  Abdominal: Soft. Bowel sounds are normal.  Musculoskeletal: Normal range of motion.  Lymphadenopathy:    He has cervical adenopathy.  Neurological: He is alert and oriented to person, place, and time.  Skin: Skin is warm and dry.  Psychiatric: He has a normal mood and affect. His behavior is normal.  Vitals reviewed.   Imaging: Dg Chest 2 View  Result Date: 03/08/2018 CLINICAL DATA:  Shortness of breath began after beginning pain medicine for kidney stones. The patient reports increase shortness of breath when walking. Former smoker. History of coronary artery disease. EXAM: CHEST - 2 VIEW COMPARISON:  PA and lateral chest x-ray of February 25, 2018 FINDINGS: The left lung is well-expanded and clear. On the right there is persistent increased density in the inferior aspect of the upper lobe. There is new thickening of the minor fissure and the previously demonstrated trace right pleural effusion has increased in volume. The cardiac silhouette is mildly enlarged. The pulmonary vascularity is normal. There is calcification  in the wall of the thoracic aorta. The bony thorax exhibits no acute abnormality. IMPRESSION: Worsening of the small right pleural effusion. Persistent airspace opacity inferiorly in the right upper lobe slightly more conspicuous than on the February 25, 2018 study. This likely reflects pneumonia. A central obstructing lesion is not excluded however. Chest CT scanning is recommended to exclude occult malignancy. Thoracic aortic atherosclerosis. Electronically Signed   By: David  Martinique M.D.   On: 03/08/2018 13:09   Dg Chest 2 View  Result Date: 02/25/2018 CLINICAL DATA:  Shortness of breath and right flank pain for the past few weeks. EXAM: CHEST - 2 VIEW COMPARISON:  Chest x-ray dated February 13, 2016. FINDINGS: Stable mild cardiomegaly. Normal pulmonary vascularity. Atherosclerotic calcification of the aortic arch. The lungs remain hyperinflated with mildly increased retrosternal airspace. New streaky opacity in the right upper lobe and trace bilateral pleural effusions. No pneumothorax. No acute osseous abnormality. Old healed left-sided rib fractures. IMPRESSION: 1. New streaky opacity in the right upper lobe is favored to reflect atelectasis/scarring, although developing pneumonia could have a similar appearance. 2. New trace bilateral pleural effusions. 3. COPD. Electronically Signed   By: Titus Dubin M.D.   On: 02/25/2018 11:27   Ct Chest Wo Contrast  Result Date: 03/08/2018 CLINICAL DATA:  Shortness of breath for 3 weeks. CT recommended after abnormal chest x-ray. EXAM: CT CHEST WITHOUT CONTRAST TECHNIQUE: Multidetector CT imaging of the chest was performed following the standard protocol without IV contrast. COMPARISON:  Chest x-ray on 03/08/2018 FINDINGS: Cardiovascular: The heart is enlarged. There is a 1.0 centimeter pericardial effusion. There is dense atherosclerotic calcification of the coronary vessels. The thoracic aorta is also partially calcified, with mildly aneurysmal ascending of the aorta  measuring 4.0 centimeters. Mediastinum/Nodes: The thyroid is mildly heterogeneous. There is extensive mediastinal and hilar adenopathy. Enlarged nodes are identified in the LOWER neck bilaterally and in the supraclavicular regions, only partially evaluated. Numerous bulky lymph nodes are identified in the superior mediastinum and RIGHT paratracheal region, largest measuring 2.2 centimeters. Prevascular lymph node is 4.7 x 2.8 centimeters. Precarinal lymph node mass is 3.0 centimeters. Subcarinal mass is 2.3 centimeters. Enlarged RIGHT hilar lymph nodes are identified, associated with narrowing of the RIGHT central airways and soft tissue mass in  the RIGHT hilum extends into the RIGHT UPPER lobe, described below. The esophagus is normal in appearance. Lungs/Pleura: Within the RIGHT UPPER lobe, there is nodular mass measuring approximately 5.0 x 2.8 centimeters. Mass is contiguous with RIGHT hilar adenopathy. There is soft tissue in the region of the RIGHT middle lobe, measuring 2.3 x 5.9 centimeters and also contiguous with the hilar adenopathy. There is thickening of the RIGHT minor and major fissures. RIGHT pleural effusion is present. LEFT lung is clear. Upper Abdomen: LEFT adrenal mass is 1.9 centimeters an indeterminate for benign lesion based on Hounsfield units. Numerous nodules are identified in the LEFT UPPER QUADRANT retroperitoneum, largest 1.8 centimeters. There is a mass extending off the UPPER pole of each kidney, not further characterized on this noncontrast exam. On the RIGHT, mass is 1.8 centimeters. On the LEFT, masses 2.1 centimeters. Partially imaged retroperitoneal lymphadenopathy. Numerous layering gallstones are present. Musculoskeletal: There is a comminuted fracture of the RIGHT scapula. There are acute fracture fragments and probable callus, consistent with a subacute injury. In the mid body of the scapula, there is question of a lytic lesion. IMPRESSION: 1. RIGHT UPPER lobe mass measuring  5.0 centimeters and suspicious for bronchogenic carcinoma. 2. Bulky adenopathy in the superior mediastinum, LOWER neck, supraclavicular region, mediastinum, and RIGHT hilar regions. 3. RIGHT pleural effusion and pleural thickening. 4. UPPER abdominal adenopathy/masses. Recommend further evaluation with CT of the abdomen and pelvis. Intravenous contrast is recommended unless contraindicated. Recommend renal protocol if possible. 5. Pericardial effusion. 6. LEFT adrenal mass 1.9 centimeters. 7. Suspect subacute fracture of the RIGHT scapula which may be a pathologic fracture. 8. Bilateral renal masses are indeterminate. 9. Cholelithiasis. 10.  Aortic Atherosclerosis (ICD10-I70.0). 11. Aortic aneurysm NOS (ICD10-I71.9). Ascending aorta is 4.0 centimeters. Recommend annual imaging followup by CTA or MRA. This recommendation follows 2010 ACCF/AHA/AATS/ACR/ASA/SCA/SCAI/SIR/STS/SVM Guidelines for the Diagnosis and Management of Patients with Thoracic Aortic Disease. Circulation. 2010; 121: N235-T732 Electronically Signed   By: Nolon Nations M.D.   On: 03/08/2018 19:55   Ct Renal Stone Study  Result Date: 02/25/2018 CLINICAL DATA:  77 year old male with a history shortness of breath and right flank pain. EXAM: CT ABDOMEN AND PELVIS WITHOUT CONTRAST TECHNIQUE: Multidetector CT imaging of the abdomen and pelvis was performed following the standard protocol without IV contrast. COMPARISON:  Ultrasound 04/23/2015 FINDINGS: Lower chest: Trace right-sided pleural effusion. No evidence of pulmonary edema. Trace pericardial fluid/thickening. Native coronary calcifications. Hepatobiliary: Unremarkable appearance of liver. Calcified cholelithiasis. No associated inflammation. No biliary ductal dilation. Pancreas: Unremarkable appearance of the pancreas Spleen: Unremarkable appearance of the spleen. Adrenals/Urinary Tract: Unremarkable right adrenal gland. Left adrenal gland demonstrates a nodule at the superior aspect with  intermediate Hounsfield units measuring 14 mm (image 16 series 3). Thickening of the lateral limb inferiorly with intermediate Hounsfield units. Right kidney without hydronephrosis. No nephrolithiasis. Distal right ureteral stone measures 8 mm at the ureterovesical junction. Trace right-sided perinephric stranding. Nodule on lateral cortex of the right kidney measures 11 mm with Hounsfield units measuring 17. Left kidney without hydronephrosis. Mild perinephric stranding. Nodule or lymph node at the hilum of the left kidney measures 16 mm. Unremarkable course of the left ureter. Calcifications of the left renal vasculature. Urinary bladder partially distended with prominent median lobe of the prostate. Stomach/Bowel: Small hiatal hernia. Unremarkable stomach. Unremarkable small bowel. No abnormal distention. Mild stool burden. Normal appendix. Colonic diverticular disease without evidence of acute diverticulitis. Vascular/Lymphatic: Vascular calcifications. No mesenteric or retroperitoneal adenopathy. No inguinal adenopathy. There are small soft  tissue nodules/implants along the peritoneum of the abdomen, including right abdomen inferior to segment 6 of the liver, adjacent to the ascending colon, within the mesentery (image 59 of series 3), adjacent to the spleen, lateral to the left kidney. Reproductive: Enlarged prostate with prominent median lobe and transverse diameter measuring 5.1 cm. Other: None Musculoskeletal: Degenerative changes of the spine. No displaced fracture. Degenerative changes of the hips. IMPRESSION: Right-sided ureterovesical junction stone measures 8 mm. No evidence of right-sided hydronephrosis. If there is concern for ascending urinary tract infection, recommend correlation with urinalysis. There are multiple small soft tissue lesions of the mesenteric/peritoneal surfaces, as above. While these may represent splenosis, the findings are nonspecific, and peritoneal carcinomatosis could have  this appearance. Referral for oncologic evaluation is recommended. Bilateral indeterminate renal lesions, including left renal hilar lymph node/implant. Once workup has been initiated, MR imaging may be considered. Indeterminate left adrenal nodule. This may be evaluated further by MRI. Trace right-sided pleural effusion. Trace pericardial fluid/thickening. Native coronary artery disease and aortic atherosclerosis. Aortic Atherosclerosis (ICD10-I70.0). Cholelithiasis. Diverticular disease without evidence of acute diverticulitis. Electronically Signed   By: Corrie Mckusick D.O.   On: 02/25/2018 13:57    Labs:  CBC: Recent Labs    02/25/18 1100 03/08/18 1214 03/09/18 0713 03/10/18 0310  WBC 8.7 6.3 6.6 6.0  HGB 12.2* 10.6* 11.2* 10.8*  HCT 37.2* 31.3* 33.2* 31.7*  PLT 314 296 354 364    COAGS: Recent Labs    03/08/18 1353  INR 1.14  APTT 39*    BMP: Recent Labs    02/25/18 1043 03/08/18 1214 03/09/18 0713 03/10/18 0310  NA 139 131* 134* 132*  K 4.3 3.6 3.9 3.8  CL 103 99 97* 97*  CO2 25 24 25 26   GLUCOSE 100* 90 112* 130*  BUN 19 19 17 19   CALCIUM 9.7 9.1 9.4 9.1  CREATININE 1.51* 1.27* 1.29* 1.25*  GFRNONAA 42* 52* 51* 53*  GFRAA 49* >60 59* >60    LIVER FUNCTION TESTS: Recent Labs    03/08/18 1214  BILITOT 0.7  AST 40  ALT 25  ALKPHOS 49  PROT 6.5  ALBUMIN 2.8*    TUMOR MARKERS: No results for input(s): AFPTM, CEA, CA199, CHROMGRNA in the last 8760 hours.  Assessment and Plan:  Cervical LAN; right lung mass; peritoneal carcinomatosis lesions Scheduled for peritoneal lesion bx vs cervical LAN bx Risks and benefits discussed with the patient including, but not limited to bleeding, infection, damage to adjacent structures or low yield requiring additional tests.  All of the patient's questions were answered, patient is agreeable to proceed. Consent signed and in chart.   Thank you for this interesting consult.  I greatly enjoyed meeting Lavante Toso and  look forward to participating in their care.  A copy of this report was sent to the requesting provider on this date.  Electronically Signed: Lavonia Drafts, PA-C 03/10/2018, 10:55 AM   I spent a total of 40 Minutes    in face to face in clinical consultation, greater than 50% of which was counseling/coordinating care for cervical LAN vs peritoneal lesion bx

## 2018-03-11 DIAGNOSIS — I10 Essential (primary) hypertension: Secondary | ICD-10-CM

## 2018-03-11 LAB — CBC
HCT: 30.2 % — ABNORMAL LOW (ref 39.0–52.0)
Hemoglobin: 10.1 g/dL — ABNORMAL LOW (ref 13.0–17.0)
MCH: 30.3 pg (ref 26.0–34.0)
MCHC: 33.4 g/dL (ref 30.0–36.0)
MCV: 90.7 fL (ref 78.0–100.0)
PLATELETS: 350 10*3/uL (ref 150–400)
RBC: 3.33 MIL/uL — AB (ref 4.22–5.81)
RDW: 13.1 % (ref 11.5–15.5)
WBC: 5.9 10*3/uL (ref 4.0–10.5)

## 2018-03-11 LAB — BASIC METABOLIC PANEL
ANION GAP: 7 (ref 5–15)
BUN: 20 mg/dL (ref 8–23)
CO2: 25 mmol/L (ref 22–32)
Calcium: 9 mg/dL (ref 8.9–10.3)
Chloride: 98 mmol/L (ref 98–111)
Creatinine, Ser: 1.18 mg/dL (ref 0.61–1.24)
GFR calc Af Amer: >60 mL/min (ref >60)
GFR, EST NON AFRICAN AMERICAN: 57 mL/min — AB (ref >60)
Glucose, Bld: 127 mg/dL — ABNORMAL HIGH (ref 70–99)
POTASSIUM: 4 mmol/L (ref 3.5–5.1)
SODIUM: 130 mmol/L — AB (ref 135–145)

## 2018-03-11 LAB — GLUCOSE, CAPILLARY
GLUCOSE-CAPILLARY: 114 mg/dL — AB (ref 70–99)
GLUCOSE-CAPILLARY: 139 mg/dL — AB (ref 70–99)
GLUCOSE-CAPILLARY: 142 mg/dL — AB (ref 70–99)
GLUCOSE-CAPILLARY: 174 mg/dL — AB (ref 70–99)
Glucose-Capillary: 132 mg/dL — ABNORMAL HIGH (ref 70–99)
Glucose-Capillary: 211 mg/dL — ABNORMAL HIGH (ref 70–99)
Glucose-Capillary: 190 mg/dL — ABNORMAL HIGH (ref 70–99)

## 2018-03-11 MED ORDER — KETOROLAC TROMETHAMINE 10 MG PO TABS
10.0000 mg | ORAL_TABLET | Freq: Three times a day (TID) | ORAL | Status: DC | PRN
  Filled 2018-03-11: qty 1

## 2018-03-11 MED ORDER — LOSARTAN POTASSIUM 25 MG PO TABS
25.0000 mg | ORAL_TABLET | Freq: Every day | ORAL | Status: DC
  Administered 2018-03-11 – 2018-03-16 (×6): 25 mg via ORAL
  Filled 2018-03-11 (×6): qty 1

## 2018-03-11 MED ORDER — MAGNESIUM HYDROXIDE 400 MG/5ML PO SUSP: 5.0000 mL | Freq: Every day | ORAL | Status: DC | PRN

## 2018-03-11 MED ORDER — MAGNESIUM HYDROXIDE 400 MG/5ML PO SUSP
15.0000 mL | Freq: Every day | ORAL | Status: DC | PRN
  Administered 2018-03-11: 15 mL via ORAL
  Filled 2018-03-11: qty 30

## 2018-03-11 NOTE — Progress Notes (Addendum)
PROGRESS NOTE    Arthur [name]  TIR:[id number] DOB: 19-Aug-1940 DOA: 03/08/2018 PCP: Dione Housekeeper, MD  Brief Narrative: 77 year old male with history of CAD, diabetes mellitus type 2, PAD, long-term tobacco abuse and suspected COPD, CVA presented to the ED with [name] yesterday with right flank pain, suspected to be related to his kidney stone, he sees Dr. Tresa Moore with urology and is supposed to have a urological procedure later this month. -in the ED at [facility] he was found to have a troponin over 6,NSTEMI, subsequently transferred to [facility] for left heart catheterization   Assessment & Plan:     NSTEMI (non-ST elevated myocardial infarction) ([place]) -asymptomatic, remote history of PCI to left circumflex in 1997 -admitted with significant troponin elevation and EKG without acute changes, peak troponin was 6, trended down to 4.4 , suspected ACS event was 2 weeks ago, when he had symptoms at home -Continue aspirin, Lopressor, atorvastatin, off heparin drip now -2-D echocardiogram with drop in EF to 35-40% and inferior wall motion abnormalities -Cardiology following, plan for further evaluation pending workup for large new lung mass, suspected metastatic Lung CA    Right upper lobe lung mass -5 cm, in right upper lobe, given 65+ years of heavy smoking highly suspicious for bronchogenic CA -Underwent US guided Biopsy of Large R Cervical Lymph node by IR 8/22 -Called and D/w Oncology Dr.Shadad, he felt there isn't much to add inpatient and recommended Outpatient Oncology Follow up, once biopsy confirms likely metastatic Lung CA   right flank pain -Has an 8 mm right UVJ calculus -Continue Flomax, added low dose toradol only for today-not a good long-term option due to CKD2, need for cardiac cath and new CHF -Continue Hydrocodone PRN -d/w Dr.Manny not appropriate for surgery in setting of recent MI, pt not satisfied with this despite long discussion regarding risks of  anesthesia    Diabetes mellitus -home medication lists high-dose lispro twice a day, very unusual regimen -hemoglobin A1c is 7.1, diabetes coordinator consult, continue sliding scale insulin for now -could transition to insulin 7030 at discharge    Tobacco abuse -counseled   CKD 2 -stable  DVT prophylaxis: heparin SQ Code Status: full code Family Communication: no family at bedside Disposition Plan: pending cardiac and malignancy evaluation  Consultants:  Cardiology IR   Procedures:   Antimicrobials:    Subjective: -continues to be bothered by right flank pain, reports that this is little better after pain medications -Denies any chest pain, no significant dyspnea  Objective: Vitals:   03/11/18 0011 03/11/18 0400 03/11/18 0829 03/11/18 0857  BP: 130/81 132/81 118/73 118/73  Pulse: 83  97 94  Resp: 19 19 16    Temp: (!) 97.5 F (36.4 C) (!) 97.5 F (36.4 C)    TempSrc: Oral Oral    SpO2: 95% 96%    Weight:      Height:        Intake/Output Summary (Last 24 hours) at 03/11/2018 1133 Last data filed at 03/10/2018 2105 Gross per 24 hour  Intake 222 ml  Output -  Net 222 ml   Filed Weights   03/08/18 1158 03/08/18 1845  Weight: 80.7 kg 80.7 kg    Examination:  Gen: Awake, Alert, Oriented X 3, no distress HEENT: PERRLA, Neck supple, no JVD Lungs: poor air movement CVS: RRR,No Gallops,Rubs or new Murmurs Abd: soft, Non tender, non distended, BS present Extremities: No Cyanosis, Clubbing or edema Skin: no new rashes Psychiatry: Judgement and insight appear normal. Mood &  affect appropriate.     Data Reviewed:   CBC: Recent Labs  Lab 03/08/18 1214 03/09/18 0713 03/10/18 0310 03/11/18 0317  WBC 6.3 6.6 6.0 5.9  NEUTROABS 4.4  --   --   --   HGB 10.6* 11.2* 10.8* 10.1*  HCT 31.3* 33.2* 31.7* 30.2*  MCV 91.0 89.0 90.3 90.7  PLT 296 354 364 259   Basic Metabolic Panel: Recent Labs  Lab 03/08/18 1214 03/09/18 0713 03/10/18 0310  03/11/18 0317  NA 131* 134* 132* 130*  K 3.6 3.9 3.8 4.0  CL 99 97* 97* 98  CO2 24 25 26 25   GLUCOSE 90 112* 130* 127*  BUN 19 17 19 20   CREATININE 1.27* 1.29* 1.25* 1.18  CALCIUM 9.1 9.4 9.1 9.0   GFR: Estimated Creatinine Clearance: 50.8 mL/min (by C-G formula based on SCr of 1.18 mg/dL). Liver Function Tests: Recent Labs  Lab 03/08/18 1214  AST 40  ALT 25  ALKPHOS 49  BILITOT 0.7  PROT 6.5  ALBUMIN 2.8*   Recent Labs  Lab 03/08/18 1214  LIPASE 24   No results for input(s): AMMONIA in the last 168 hours. Coagulation Profile: Recent Labs  Lab 03/08/18 1353 03/10/18 1024  INR 1.14 1.05   Cardiac Enzymes: Recent Labs  Lab 03/08/18 1214 03/08/18 1642 03/09/18 0713  TROPONINI 6.00* 5.77* 4.44*   BNP (last 3 results) No results for input(s): PROBNP in the last 8760 hours. HbA1C: Recent Labs    03/08/18 1642 03/09/18 1326  HGBA1C 7.2* 7.1*   CBG: Recent Labs  Lab 03/10/18 1716 03/10/18 2052 03/11/18 0009 03/11/18 0400 03/11/18 0832  GLUCAP 151* 191* 142* 132* 139*   Lipid Profile: Recent Labs    03/09/18 0500 03/09/18 0713  CHOL  --  90  HDL  --  21*  LDLCALC PENDING 39  TRIG  --  152*  CHOLHDL PENDING 4.3   Thyroid Function Tests: No results for input(s): TSH, T4TOTAL, FREET4, T3FREE, THYROIDAB in the last 72 hours. Anemia Panel: No results for input(s): VITAMINB12, FOLATE, FERRITIN, TIBC, IRON, RETICCTPCT in the last 72 hours. Urine analysis:    Component Value Date/Time   COLORURINE YELLOW 02/25/2018 [place] 02/25/2018 1157   LABSPEC 1.016 02/25/2018 1157   PHURINE 6.0 02/25/2018 1157   GLUCOSEU 50 (A) 02/25/2018 1157   HGBUR NEGATIVE 02/25/2018 1157   BILIRUBINUR NEGATIVE 02/25/2018 1157   KETONESUR NEGATIVE 02/25/2018 1157   PROTEINUR NEGATIVE 02/25/2018 1157   NITRITE NEGATIVE 02/25/2018 1157   LEUKOCYTESUR NEGATIVE 02/25/2018 1157   Sepsis Labs: @LABRCNTIP (procalcitonin:4,lacticidven:4)  )No results  found for this or any previous visit (from the past 240 hour(s)).       Radiology Studies: US Biopsy (abdominal Retropertioneal Mass)  Result Date: 03/10/2018 INDICATION: 77 year old with evidence for metastatic disease and needs a tissue diagnosis. Patient is extensive cervical lymphadenopathy, chest lymphadenopathy, right lung lesion and concern for abdominal metastatic disease. EXAM: ULTRASOUND-GUIDED CORE BIOPSY OF RIGHT CERVICAL LYMPH NODES MEDICATIONS: None. ANESTHESIA/SEDATION: Moderate (conscious) sedation was employed during this procedure. A total of Versed 1 mg and Fentanyl 25 mcg was administered intravenously. Moderate Sedation Time: 13 minutes. The patient's level of consciousness and vital signs were monitored continuously by radiology nursing throughout the procedure under my direct supervision. FLUOROSCOPY TIME:  None COMPLICATIONS: None immediate. PROCEDURE: Informed written consent was obtained from the patient after a thorough discussion of the procedural risks, benefits and alternatives. All questions were addressed. A timeout was performed prior to the initiation of  the procedure. Both sides of the neck were evaluated with ultrasound. Patient has enlarged abnormal lymph nodes on both sides of the neck. Lymphadenopathy appears to be greater on the right side where the patient actually has a palpable lesion. A cluster of abnormal lymph nodes was targeted for biopsy. The right side of the neck was prepped with chlorhexidine and sterile field was created. Skin was anesthetized with 1% lidocaine. Using ultrasound guidance, 18 gauge core needle was directed into this cluster of abnormal lymph nodes. Total of 5 core biopsies were obtained. Specimens placed in saline. Bandage placed over the puncture site. FINDINGS: Cluster of abnormal lymph nodes in the right lower neck. In addition, there are abnormal large lymph nodes on the left side of the neck. Core biopsy needle confirmed within the  abnormal lymph nodes. No evidence for hematoma formation or bleeding following the core biopsies. IMPRESSION: Successful ultrasound-guided core biopsies of abnormal right cervical lymph nodes. Electronically Signed   By: Markus Daft M.D.   On: 03/10/2018 16:07        Scheduled Meds: . aspirin EC  325 mg Oral Daily  . atorvastatin  20 mg Oral q1800  . cholecalciferol  2,000 Units Oral Daily  . doxazosin  3 mg Oral QHS  . fenofibrate  160 mg Oral Daily  . heparin injection (subcutaneous)  5,000 Units Subcutaneous Q8H  . insulin aspart  0-9 Units Subcutaneous Q4H  . metoprolol tartrate  50 mg Oral BID  . omega-3 acid ethyl esters  2 g Oral Daily  . tamsulosin  0.4 mg Oral Daily   Continuous Infusions:    LOS: 3 days    Time spent:61min    Domenic Polite, MD [facility] Page via [email or web address], password [email or web address] After 7PM please contact night-coverage  03/11/2018, 11:33 AM

## 2018-03-11 NOTE — Evaluation (Signed)
Physical Therapy Evaluation Patient Details Name: Arthur [name] MRN: [id number] DOB: August 15, 1940 Today's Date: 03/11/2018   History of Present Illness  Pt presented to [facility] with rt flank pain related to known kidney stone. Pt found to have elevated troponin and thought to be NSTEMI and transferred to [facility]. Work up found rt lung mass thought to be metastatic lung CA with biopsy pending. PMH - CAD, DM, copd, CVA, kidney stones, PAD  Clinical Impression  Pt doing well with mobility and no further PT needed.  Recommend continued mobility while hospitalized.      Follow Up Recommendations No PT follow up    Equipment Recommendations  None recommended by PT    Recommendations for Other Services       Precautions / Restrictions Precautions Precautions: None Restrictions Weight Bearing Restrictions: No      Mobility  Bed Mobility               General bed mobility comments: pt up in chair  Transfers Overall transfer level: Modified independent                  Ambulation/Gait Ambulation/Gait assistance: Modified independent (Device/Increase time) Gait Distance (Feet): 300 Feet Assistive device: None Gait Pattern/deviations: WFL(Within Functional Limits);Decreased stride length   Gait velocity interpretation: >2.62 ft/sec, indicative of community ambulatory    Stairs            Wheelchair Mobility    Modified Rankin (Stroke Patients Only)       Balance Overall balance assessment: Mild deficits observed, not formally tested                                           Pertinent Vitals/Pain Pain Assessment: No/denies pain    Home Living Family/patient expects to be discharged to:: Private residence Living Arrangements: Alone   Type of Home: [place]: One level        Prior Function Level of Independence: Independent               Hand Dominance        Extremity/Trunk Assessment   Upper  Extremity Assessment Upper Extremity Assessment: Defer to OT evaluation    Lower Extremity Assessment Lower Extremity Assessment: Overall WFL for tasks assessed       Communication   Communication: HOH  Cognition Arousal/Alertness: Awake/alert Behavior During Therapy: WFL for tasks assessed/performed Overall Cognitive Status: Within Functional Limits for tasks assessed                                        General Comments      Exercises     Assessment/Plan    PT Assessment Patent does not need any further PT services  PT Problem List         PT Treatment Interventions      PT Goals (Current goals can be found in the Care Plan section)  Acute Rehab PT Goals PT Goal Formulation: All assessment and education complete, DC therapy    Frequency     Barriers to discharge        Co-evaluation               AM-PAC PT "6 Clicks" Daily Activity  Outcome Measure  Difficulty turning over in bed (including adjusting bedclothes, sheets and blankets)?: None Difficulty moving from lying on back to sitting on the side of the bed? : None Difficulty sitting down on and standing up from a chair with arms (e.g., wheelchair, bedside commode, etc,.)?: None Help needed moving to and from a bed to chair (including a wheelchair)?: None Help needed walking in hospital room?: None Help needed climbing 3-5 steps with a railing? : None 6 Click Score: 24    End of Session   Activity Tolerance: Patient tolerated treatment well Patient left: in chair;with call bell/phone within reach Nurse Communication: Mobility status PT Visit Diagnosis: Unsteadiness on feet (R26.81)    Time: 3491-7915 PT Time Calculation (min) (ACUTE ONLY): 8 min   Charges:   PT Evaluation $PT Eval Low Complexity: [place] 03/11/2018, 5:46 PM

## 2018-03-11 NOTE — Progress Notes (Signed)
Progress Note  Patient Name: Arthur [name] Date of Encounter: 03/11/2018  Primary Cardiologist: Dr. Quay Burow, MD   Subjective   Pt feels good today. No chest pain or SOB, only mild lower back pain from kidney stone.   Tolerated the biopsy of his right supraclavicular lymph node.  No CP or dyspnea  Lying flat without any problems   Inpatient Medications    Scheduled Meds: . aspirin EC  325 mg Oral Daily  . atorvastatin  20 mg Oral q1800  . cholecalciferol  2,000 Units Oral Daily  . doxazosin  3 mg Oral QHS  . fenofibrate  160 mg Oral Daily  . heparin injection (subcutaneous)  5,000 Units Subcutaneous Q8H  . insulin aspart  0-9 Units Subcutaneous Q4H  . metoprolol tartrate  50 mg Oral BID  . omega-3 acid ethyl esters  2 g Oral Daily  . tamsulosin  0.4 mg Oral Daily   Continuous Infusions:  PRN Meds: acetaminophen, HYDROcodone-acetaminophen, HYDROmorphone (DILAUDID) injection, ketorolac, nitroGLYCERIN, ondansetron (ZOFRAN) IV   Vital Signs    Vitals:   03/11/18 0400 03/11/18 0829 03/11/18 0857 03/11/18 1202  BP: 132/81 118/73 118/73 125/77  Pulse:  97 94 84  Resp: 19 16  (!) 23  Temp: (!) 97.5 F (36.4 C)   98 F (36.7 C)  TempSrc: Oral   Oral  SpO2: 96%     Weight:      Height:        Intake/Output Summary (Last 24 hours) at 03/11/2018 1222 Last data filed at 03/10/2018 2105 Gross per 24 hour  Intake 222 ml  Output -  Net 222 ml   Filed Weights   03/08/18 1158 03/08/18 1845  Weight: 80.7 kg 80.7 kg   Physical Exam    Physical Exam: Blood pressure 125/77, pulse 84, temperature 98 F (36.7 C), temperature source Oral, resp. rate (!) 23, height 5\' 9"  (1.753 m), weight 80.7 kg, SpO2 96 %.  GEN:   Elderly male, nad  HEENT: Normal NECK: No JVD; No carotid bruits,  Large right supraclavicular lymph node LYMPHATICS: No lymphadenopathy CARDIAC:  RR  RESPIRATORY:  Clear to auscultation without rales, wheezing or rhonchi  ABDOMEN: Soft, non-tender,  non-distended MUSCULOSKELETAL:  No edema; No deformity  SKIN: Warm and dry NEUROLOGIC:  Alert and oriented x 3    Labs    Chemistry Recent Labs  Lab 03/08/18 1214  03/09/18 0713 03/10/18 0310 03/11/18 0317  NA 131*  --  134* 132* 130*  K 3.6  --  3.9 3.8 4.0  CL 99  --  97* 97* 98  CO2 24  --  25 26 25   GLUCOSE 90  --  112* 130* 127*  BUN 19  --  17 19 20   CREATININE 1.27*  --  1.29* 1.25* 1.18  CALCIUM 9.1  --  9.4 9.1 9.0  PROT 6.5  --   --   --   --   ALBUMIN 2.8*  --   --   --   --   AST 40  --   --   --   --   ALT 25  --   --   --   --   ALKPHOS 49  --   --   --   --   BILITOT 0.7  --   --   --   --   GFRNONAA 52*  --  51* 53* 57*  GFRAA >60  --  59* >60 >  60  ANIONGAP 8   < > 12 9 7    < > = values in this interval not displayed.     Hematology Recent Labs  Lab 03/09/18 0713 03/10/18 0310 03/11/18 0317  WBC 6.6 6.0 5.9  RBC 3.73* 3.51* 3.33*  HGB 11.2* 10.8* 10.1*  HCT 33.2* 31.7* 30.2*  MCV 89.0 90.3 90.7  MCH 30.0 30.8 30.3  MCHC 33.7 34.1 33.4  RDW 13.0 13.1 13.1  PLT 354 364 350    Cardiac Enzymes Recent Labs  Lab 03/08/18 1214 03/08/18 1642 03/09/18 0713  TROPONINI 6.00* 5.77* 4.44*   No results for input(s): TROPIPOC in the last 168 hours.   BNP Recent Labs  Lab 03/08/18 1214  BNP 734.0*     DDimer No results for input(s): DDIMER in the last 168 hours.   Radiology    US Biopsy (abdominal Retropertioneal Mass)  Result Date: 03/10/2018 INDICATION: 77 year old with evidence for metastatic disease and needs a tissue diagnosis. Patient is extensive cervical lymphadenopathy, chest lymphadenopathy, right lung lesion and concern for abdominal metastatic disease. EXAM: ULTRASOUND-GUIDED CORE BIOPSY OF RIGHT CERVICAL LYMPH NODES MEDICATIONS: None. ANESTHESIA/SEDATION: Moderate (conscious) sedation was employed during this procedure. A total of Versed 1 mg and Fentanyl 25 mcg was administered intravenously. Moderate Sedation Time: 13 minutes.  The patient's level of consciousness and vital signs were monitored continuously by radiology nursing throughout the procedure under my direct supervision. FLUOROSCOPY TIME:  None COMPLICATIONS: None immediate. PROCEDURE: Informed written consent was obtained from the patient after a thorough discussion of the procedural risks, benefits and alternatives. All questions were addressed. A timeout was performed prior to the initiation of the procedure. Both sides of the neck were evaluated with ultrasound. Patient has enlarged abnormal lymph nodes on both sides of the neck. Lymphadenopathy appears to be greater on the right side where the patient actually has a palpable lesion. A cluster of abnormal lymph nodes was targeted for biopsy. The right side of the neck was prepped with chlorhexidine and sterile field was created. Skin was anesthetized with 1% lidocaine. Using ultrasound guidance, 18 gauge core needle was directed into this cluster of abnormal lymph nodes. Total of 5 core biopsies were obtained. Specimens placed in saline. Bandage placed over the puncture site. FINDINGS: Cluster of abnormal lymph nodes in the right lower neck. In addition, there are abnormal large lymph nodes on the left side of the neck. Core biopsy needle confirmed within the abnormal lymph nodes. No evidence for hematoma formation or bleeding following the core biopsies. IMPRESSION: Successful ultrasound-guided core biopsies of abnormal right cervical lymph nodes. Electronically Signed   By: Markus Daft M.D.   On: 03/10/2018 16:07   Telemetry    03/10/18 NSR HR 72 - Personally Reviewed  ECG    No new tracing as of 03/10/18 - Personally Reviewed  Cardiac Studies   Echocardiogram 03/09/18: Study Conclusions  - Left ventricle: The cavity size was normal. Systolic function was   moderately reduced. The estimated ejection fraction was in the   range of 35% to 40%. Diffuse hypokinesis. There is akinesis of   the inferior and  inferoseptal myocardium. Doppler parameters are   consistent with abnormal left ventricular relaxation (grade 1   diastolic dysfunction). - Aortic valve: Trileaflet; mildly thickened, mildly calcified   leaflets. - Pericardium, extracardiac: A small pericardial effusion was   identified circumferential to the heart. There was no evidence of   hemodynamic compromise.  Impressions:  - EF is decreased when compared  to prior.  Patient Profile     78 y.o. male with a history of CAD with prior PCI to LCX in 1997, last stress in 2012 neg for ischemia, PAD, DM2,HTN, HLD  presents with 1 month of shortness of breath to [facility].  He had elevated troponin and was transferred to [facility] for cardiac cath.  After transfer he had CT of chest with results as above.   Assessment & Plan    1. NSTEMI: -Peak troponin > 6.0>>5.77>4.44 -Plan was initially for cardiac cath however, significantly abnormal chest CT with presence of multiple masses>>>   -Echocardiogram with decreased LVEF to 35-40% with akinesis of  the inferior and inferoseptal myocardium and grade 1  diastolic dysfunction The patient remains pain-free.  We will need to wait for the results of the lymph node pathology before we proceed with any type of cardiac work-up.  2. CAD: -Hx of stent to LCX in 1997 and other non-obstructive disease -Last stress test 2012, negative for ischemia -See plan above  3.  Acute on chronic systolic congestive heart failure: Early on metoprolol.  Will start losartan 25 mg a day.  4. Mild ascending aneurysm: -Measures 4.0cm   5. Nodular mass in right upper lobe: Right supraclavicular lymph node has been biopsied this morning.  Awaiting pathology.   6. Right pleural effusion: -Increased from 02/25/18 with CT of abd for renal stone  7. CKD stage III: -Creatinine, 1.25 today>>dopwn from 1.29 yesterday  -Avoid nephrotoxic medications   8. HLD: -LDL, 39  9.DM2: -HbA1c, 7.2 -SSI for glucose  control  10. Tobacco use: -Ongoing, encouraged cessation     Mertie Moores, MD  03/11/2018 12:26 PM    [place],  [place], [place]  [address] Pager [phone] Phone: [phone]; Fax: [phone]

## 2018-03-12 LAB — CBC
HCT: 33 % — ABNORMAL LOW (ref 39.0–52.0)
Hemoglobin: 10.7 g/dL — ABNORMAL LOW (ref 13.0–17.0)
MCH: 29.6 pg (ref 26.0–34.0)
MCHC: 32.4 g/dL (ref 30.0–36.0)
MCV: 91.2 fL (ref 78.0–100.0)
Platelets: 421 10*3/uL — ABNORMAL HIGH (ref 150–400)
RBC: 3.62 MIL/uL — AB (ref 4.22–5.81)
RDW: 13.3 % (ref 11.5–15.5)
WBC: 7.4 10*3/uL (ref 4.0–10.5)

## 2018-03-12 LAB — GLUCOSE, CAPILLARY
GLUCOSE-CAPILLARY: 130 mg/dL — AB (ref 70–99)
Glucose-Capillary: 168 mg/dL — ABNORMAL HIGH (ref 70–99)
Glucose-Capillary: 165 mg/dL — ABNORMAL HIGH (ref 70–99)
Glucose-Capillary: 176 mg/dL — ABNORMAL HIGH (ref 70–99)
Glucose-Capillary: 150 mg/dL — ABNORMAL HIGH (ref 70–99)
Glucose-Capillary: 161 mg/dL — ABNORMAL HIGH (ref 70–99)

## 2018-03-12 LAB — BASIC METABOLIC PANEL
ANION GAP: 9 (ref 5–15)
BUN: 18 mg/dL (ref 8–23)
CHLORIDE: 96 mmol/L — AB (ref 98–111)
CO2: 26 mmol/L (ref 22–32)
Calcium: 9.2 mg/dL (ref 8.9–10.3)
Creatinine, Ser: 1.17 mg/dL (ref 0.61–1.24)
GFR calc Af Amer: >60 mL/min (ref >60)
GFR, EST NON AFRICAN AMERICAN: 57 mL/min — AB (ref >60)
GLUCOSE: 148 mg/dL — AB (ref 70–99)
POTASSIUM: 4 mmol/L (ref 3.5–5.1)
Sodium: 131 mmol/L — ABNORMAL LOW (ref 135–145)

## 2018-03-12 MED ORDER — SENNOSIDES-DOCUSATE SODIUM 8.6-50 MG PO TABS
1.0000 | ORAL_TABLET | Freq: Two times a day (BID) | ORAL | Status: DC
  Administered 2018-03-12 – 2018-03-16 (×9): 1 via ORAL
  Filled 2018-03-12 (×8): qty 1

## 2018-03-12 MED ORDER — METOPROLOL SUCCINATE ER 50 MG PO TB24
50.0000 mg | ORAL_TABLET | Freq: Every day | ORAL | Status: DC
  Administered 2018-03-12 – 2018-03-16 (×5): 50 mg via ORAL
  Filled 2018-03-12 (×5): qty 1

## 2018-03-12 MED ORDER — POLYETHYLENE GLYCOL 3350 17 G PO PACK
17.0000 g | PACK | Freq: Every day | ORAL | Status: DC
  Administered 2018-03-12 – 2018-03-14 (×3): 17 g via ORAL
  Filled 2018-03-12 (×3): qty 1

## 2018-03-12 MED ORDER — BISACODYL 10 MG RE SUPP
10.0000 mg | Freq: Once | RECTAL | Status: DC
  Filled 2018-03-12: qty 1

## 2018-03-12 NOTE — Consult Note (Signed)
Reason for Consult: Right Ureteral Stone  Referring Physician: Domenic Polite MD  Arthur [name] is an 77 y.o. male.   HPI:   1 - Lower Urinary Tract Symptoms - Pt with slowly progressive irritative and obstructive symptoms with some weak stream, and progressive urinary urgency. NO retention. DRE 2018 60gm smooth. PVR 2018 "170m". PSA 1.5 2016. Has been on Doxazosin x years per PCP, and PRN oxybutynin added 2018. No gross hematuria. He is diabetic with neuropathy and A1C >9. Prostate Vol 1053mwith large median lobe by CT 2019. Did not tollerate finasteride.   2 - Chronic Kidney Disease Stage 3 - Long h/o IDDM, HTN and CR around 1.6. CT 2019 w/o hydro.   3 - Rt Ureteral Stone - 15m89mt UVJ stone with minimal hydro by ER CT 02/2018 on eval Rt flank pain. Stone is solitary, 1100HU, 15mm33mr 1.5 (at baseline) and UA without infectious parameters. Tentatively schjeduled for ureteroscopy on 03/18/18.   PMH sig for IDDM2, HTN, Appy, CVA (some left sided weakness, no blood thinners), Rt Lung mass (path pending), CAD/MI (cath pending). He stays very active taking care of his 3 acre yard. His PCP is RobyGlendale Chardwith [facility].    Today " Arthur [name] in consultation for above. He unfortunately is hospitalized with NSTEMI and new lung mass. Lung mass BX'd 8/22 / pending. Cr <1.5 this admission.      Past Medical History:  Diagnosis Date  . Arthritis   . Chronic kidney disease   . Claudication (HCC)[place]. Coronary artery disease 1997   CFX PCI, '97. Mod residual RI and RCA dis.  . Diabetes mellitus (HCC)[place] IDDM  . Dyslipidemia   . Hard of hearing   . History of bronchitis   . History of kidney stones 1958  . HTN (hypertension)   . PAD (peripheral artery disease) (HCC)[place]. Pneumonia   . Tobacco abuse   . Urinary incontinence   . Wears glasses     Past Surgical History:  Procedure Laterality Date  . APPENDECTOMY     "busted on me"  . COLONOSCOPY    . CORONARY  ANGIOPLASTY  1997   CFX  . ENDARTERECTOMY FEMORAL Right 05/13/2016   iliofemoral endarterectomy with bovine pericardial patch angioplasty  . ENDARTERECTOMY FEMORAL Right 05/13/2016   Procedure: RIGHT ENDARTERECTOMY FEMORAL;  Surgeon: VancSerafina Mitchell;  [facility];  Laterality: Right;  . PATCH ANGIOPLASTY Right 05/13/2016   Procedure: PATCWoodburnurgeon: VancSerafina Mitchell;  [facility];  Laterality: Right;  . PERIPHERAL VASCULAR CATHETERIZATION N/A 02/20/2016   Procedure: Lower Extremity Angiography;  Surgeon: JonaLorretta Harp;  [facility];  Service: Cardiovascular;  Laterality: N/A;    Family History  Problem Relation Age of Onset  . Diabetes Sister   . Arthritis Mother   . Heart disease Father     Social History:  reports that he has quit smoking. His smoking use included cigarettes. He has a 16.25 pack-year smoking history. He has never used smokeless tobacco. He reports that he does not drink alcohol or use drugs.  Allergies:  Allergies  Allergen Reactions  . Codeine Swelling    Medications: I have reviewed the patient's current medications.  Results for orders placed or performed during the hospital encounter of 03/08/18 (from the past 48 hour(s))  Glucose, capillary  Status: Abnormal   Collection Time: 03/10/18 11:33 AM  Result Value Ref Range   Glucose-Capillary 183 (H) 70 - 99 mg/dL  Glucose, capillary     Status: Abnormal   Collection Time: 03/10/18  5:16 PM  Result Value Ref Range   Glucose-Capillary 151 (H) 70 - 99 mg/dL  Glucose, capillary     Status: Abnormal   Collection Time: 03/10/18  8:52 PM  Result Value Ref Range   Glucose-Capillary 191 (H) 70 - 99 mg/dL  Glucose, capillary     Status: Abnormal   Collection Time: 03/11/18 12:09 AM  Result Value Ref Range   Glucose-Capillary 142 (H) 70 - 99 mg/dL  CBC     Status: Abnormal   Collection  Time: 03/11/18  3:17 AM  Result Value Ref Range   WBC 5.9 4.0 - 10.5 K/uL   RBC 3.33 (L) 4.22 - 5.81 MIL/uL   Hemoglobin 10.1 (L) 13.0 - 17.0 g/dL   HCT 30.2 (L) 39.0 - 52.0 %   MCV 90.7 78.0 - 100.0 fL   MCH 30.3 26.0 - 34.0 pg   MCHC 33.4 30.0 - 36.0 g/dL   RDW 13.1 11.5 - 15.5 %   Platelets 350 150 - 400 K/uL    Comment: Performed at [facility], [place], [place], [place] [address]  Basic metabolic panel     Status: Abnormal   Collection Time: 03/11/18  3:17 AM  Result Value Ref Range   Sodium 130 (L) 135 - 145 mmol/L   Potassium 4.0 3.5 - 5.1 mmol/L   Chloride 98 98 - 111 mmol/L   CO2 25 22 - 32 mmol/L   Glucose, Bld 127 (H) 70 - 99 mg/dL   BUN 20 8 - 23 mg/dL   Creatinine, Ser 1.18 0.61 - 1.24 mg/dL   Calcium 9.0 8.9 - 10.3 mg/dL   GFR calc non Af Amer 57 (L) >60 mL/min   GFR calc Af Amer >60 >60 mL/min    Comment: (NOTE) The eGFR has been calculated using the CKD EPI equation. This calculation has not been validated in all clinical situations. eGFR's persistently <60 mL/min signify possible Chronic Kidney Disease.    Anion gap 7 5 - 15    Comment: Performed at [facility] [address], [place], [place] [address]  Glucose, capillary     Status: Abnormal   Collection Time: 03/11/18  4:00 AM  Result Value Ref Range   Glucose-Capillary 132 (H) 70 - 99 mg/dL  Glucose, capillary     Status: Abnormal   Collection Time: 03/11/18  8:32 AM  Result Value Ref Range   Glucose-Capillary 139 (H) 70 - 99 mg/dL  Glucose, capillary     Status: Abnormal   Collection Time: 03/11/18 12:04 PM  Result Value Ref Range   Glucose-Capillary 211 (H) 70 - 99 mg/dL  Glucose, capillary     Status: Abnormal   Collection Time: 03/11/18  4:28 PM  Result Value Ref Range   Glucose-Capillary 190 (H) 70 - 99 mg/dL  Glucose, capillary     Status: Abnormal   Collection Time: 03/11/18  8:27 PM  Result Value Ref Range   Glucose-Capillary 174 (H) 70 - 99 mg/dL  Glucose,  capillary     Status: Abnormal   Collection Time: 03/11/18 11:45 PM  Result Value Ref Range   Glucose-Capillary 114 (H) 70 - 99 mg/dL  Glucose, capillary     Status: Abnormal   Collection Time: 03/12/18  4:15 AM  Result Value Ref Range   Glucose-Capillary 168 (H) 70 - 99 mg/dL  CBC     Status: Abnormal   Collection Time: 03/12/18  8:01 AM  Result Value Ref Range   WBC 7.4 4.0 - 10.5 K/uL   RBC 3.62 (L) 4.22 - 5.81 MIL/uL   Hemoglobin 10.7 (L) 13.0 - 17.0 g/dL   HCT 33.0 (L) 39.0 - 52.0 %   MCV 91.2 78.0 - 100.0 fL   MCH 29.6 26.0 - 34.0 pg   MCHC 32.4 30.0 - 36.0 g/dL   RDW 13.3 11.5 - 15.5 %   Platelets 421 (H) 150 - 400 K/uL    Comment: Performed at [facility], [place] [address], [place], [place] [address]  Basic metabolic panel     Status: Abnormal   Collection Time: 03/12/18  8:01 AM  Result Value Ref Range   Sodium 131 (L) 135 - 145 mmol/L   Potassium 4.0 3.5 - 5.1 mmol/L   Chloride 96 (L) 98 - 111 mmol/L   CO2 26 22 - 32 mmol/L   Glucose, Bld 148 (H) 70 - 99 mg/dL   BUN 18 8 - 23 mg/dL   Creatinine, Ser 1.17 0.61 - 1.24 mg/dL   Calcium 9.2 8.9 - 10.3 mg/dL   GFR calc non Af Amer 57 (L) >60 mL/min   GFR calc Af Amer >60 >60 mL/min    Comment: (NOTE) The eGFR has been calculated using the CKD EPI equation. This calculation has not been validated in all clinical situations. eGFR's persistently <60 mL/min signify possible Chronic Kidney Disease.    Anion gap 9 5 - 15    Comment: Performed at [facility] [address], [place], [place] [address]  Glucose, capillary     Status: Abnormal   Collection Time: 03/12/18  8:20 AM  Result Value Ref Range   Glucose-Capillary 165 (H) 70 - 99 mg/dL    US Biopsy (abdominal Retropertioneal Mass)  Result Date: 03/10/2018 INDICATION: 77 year old with evidence for metastatic disease and needs a tissue diagnosis. Patient is extensive cervical lymphadenopathy, chest lymphadenopathy, right lung lesion and concern for  abdominal metastatic disease. EXAM: ULTRASOUND-GUIDED CORE BIOPSY OF RIGHT CERVICAL LYMPH NODES MEDICATIONS: None. ANESTHESIA/SEDATION: Moderate (conscious) sedation was employed during this procedure. A total of Versed 1 mg and Fentanyl 25 mcg was administered intravenously. Moderate Sedation Time: 13 minutes. The patient's level of consciousness and vital signs were monitored continuously by radiology nursing throughout the procedure under my direct supervision. FLUOROSCOPY TIME:  None COMPLICATIONS: None immediate. PROCEDURE: Informed written consent was obtained from the patient after a thorough discussion of the procedural risks, benefits and alternatives. All questions were addressed. A timeout was performed prior to the initiation of the procedure. Both sides of the neck were evaluated with ultrasound. Patient has enlarged abnormal lymph nodes on both sides of the neck. Lymphadenopathy appears to be greater on the right side where the patient actually has a palpable lesion. A cluster of abnormal lymph nodes was targeted for biopsy. The right side of the neck was prepped with chlorhexidine and sterile field was created. Skin was anesthetized with 1% lidocaine. Using ultrasound guidance, 18 gauge core needle was directed into this cluster of abnormal lymph nodes. Total of 5 core biopsies were obtained. Specimens placed in saline. Bandage placed over the puncture site. FINDINGS: Cluster of abnormal lymph nodes in the right lower neck. In addition, there are abnormal large lymph nodes on the left side  of the neck. Core biopsy needle confirmed within the abnormal lymph nodes. No evidence for hematoma formation or bleeding following the core biopsies. IMPRESSION: Successful ultrasound-guided core biopsies of abnormal right cervical lymph nodes. Electronically Signed   By: Markus Daft M.D.   On: 03/10/2018 16:07    Review of Systems  Constitutional: Negative.  Negative for chills and fever.  HENT: Negative.    Eyes: Negative.   Respiratory: Negative.   Cardiovascular: Negative.  Negative for chest pain.  Gastrointestinal: Negative.   Genitourinary: Positive for flank pain and urgency.  Skin: Negative.   Neurological: Negative.   Endo/Heme/Allergies: Negative.   Psychiatric/Behavioral: Negative.    Blood pressure (!) 141/71, pulse 93, temperature (!) 96.6 F (35.9 C), temperature source Axillary, resp. rate (!) 21, height 5' 9"  (1.753 m), weight 80.7 kg, SpO2 97 %. Physical Exam  Constitutional: He appears well-developed.  Pleasant, at basline.   HENT:  Head: Normocephalic.  Neck: Normal range of motion.  Cardiovascular: Normal rate.  By bedside monitor  Respiratory: Effort normal.  GI: Soft.  Genitourinary:  Genitourinary Comments: Mild Rt CVAT  Musculoskeletal: Normal range of motion.  Neurological: He is alert.  Skin: Skin is warm.  Psychiatric: He has a normal mood and affect.    Assessment/Plan:   1 - Lower Urinary Tract Symptoms - doing well on alpha blockers, continue.   2 - Chronic Kidney Disease Stage 3 - stable medical renal disease, treatment of his partially obstructing ureteral stone will also help preserve.   3 - Rt Ureteral Stone - Unlikely to pass with medical therapy alone, and his colic pain remains his biggest day to day complaint. Rec proceed as planned with Rt ureteroscopy to stone free pending cards opinion. This is endoscopic procedure that can be safely done ON antiplatlet therapy / anticoagulation if needed. Fortunately his GFR remains acceptable, pain managable (though still bothersome) and no urine infectious parametrs.  Consider low dose PO ketorolac (69m Q8 or similar) if needed, his GFR appears acceptable to tollerate.  Pt has good understanding of competing CV and GU surgical risks.  I will discuss with cardiology collegues his peri-op risk and optimization.   Arthur [name] 03/12/2018, 10:38 AM

## 2018-03-12 NOTE — Progress Notes (Signed)
PROGRESS NOTE    Arthur [name]  ZOX:[id number] DOB: 12-Jul-1941 DOA: 03/08/2018 PCP: Dione Housekeeper, MD  Brief Narrative: 77 year old male with history of CAD, diabetes mellitus type 2, PAD, long-term tobacco abuse and suspected COPD, CVA presented to the ED with [name] yesterday with right flank pain, suspected to be related to his kidney stone, he sees Dr. Tresa Moore with urology and is supposed to have a urological procedure later this month. -in the ED at [facility] he was found to have a troponin over 6,NSTEMI, subsequently transferred to [facility] for left heart catheterization -found to have Large RUL Lung mass with metastatic lesions, s/p Supraclav LN biopsy -Cards following  Assessment & Plan:     NSTEMI (non-ST elevated myocardial infarction) ([place]) -asymptomatic, remote history of PCI to left circumflex in 1997 -admitted with significant troponin elevation and EKG without acute changes, peak troponin was 6, trended down to 4.4 , suspected ACS event was 2 weeks ago, when he had symptoms at home -Continue aspirin, Lopressor, atorvastatin, off heparin drip now -2-D echocardiogram with drop in EF to 35-40% and inferior wall motion abnormalities -Cardiology following, plan for further evaluation pending workup for large new lung mass, suspected metastatic Lung CA    Right upper lobe lung mass -5 cm, in right upper lobe, given 65+ years of heavy smoking highly suspicious for bronchogenic CA -Underwent US guided Biopsy of Large R Cervical Lymph node by IR 8/22 -Called and D/w Oncology Dr.Shadad, he felt there isn't much to add inpatient and recommended Outpatient Oncology Follow up, once biopsy confirms likely metastatic Lung CA   right flank pain -Has an 8 mm right UVJ calculus -Continue Flomax, added low dose toradol only for today-not a good long-term option due to CKD2, need for cardiac cath and new CHF -Continue Hydrocodone PRN -d/w Dr.Manny not appropriate for surgery in  setting of recent MI, await Cards input    Diabetes mellitus -home medication lists high-dose lispro twice a day, very unusual regimen -hemoglobin A1c is 7.1, diabetes coordinator consult, continue sliding scale insulin for now -could transition to insulin 70/30 at discharge    Tobacco abuse -counseled   CKD 2 -stable  DVT prophylaxis: heparin SQ Code Status: full code Family Communication: no family at bedside Disposition Plan: pending cardiac and malignancy evaluation  Consultants:  Cardiology IR D/w Oncology Dr.Shadad   Procedures:   Antimicrobials:    Subjective: -c/o R flank pain, no chest pain, no dyspnea  Objective: Vitals:   03/11/18 2346 03/12/18 0407 03/12/18 0817 03/12/18 1234  BP: 139/75 (!) 146/80 (!) 141/71 (!) 125/94  Pulse: 86 88 93 82  Resp: 16 20 (!) 21 12  Temp: 97.8 F (36.6 C) 98.3 F (36.8 C) (!) 96.6 F (35.9 C) 97.8 F (36.6 C)  TempSrc: Oral Oral Axillary Oral  SpO2: 94% 97% 97% 100%  Weight:      Height:        Intake/Output Summary (Last 24 hours) at 03/12/2018 1254 Last data filed at 03/11/2018 2125 Gross per 24 hour  Intake 222 ml  Output -  Net 222 ml   Filed Weights   03/08/18 1158 03/08/18 1845  Weight: 80.7 kg 80.7 kg    Examination:  Gen: Awake, Alert, Oriented X 3,  HEENT: PERRLA, Neck supple, no JVD Lungs: improved air movement bilaterally, CTAB CVS: RRR,No Gallops,Rubs or new Murmurs Abd: soft, Non tender, non distended, BS present Extremities: No Cyanosis, Clubbing or edema Skin: no new rashes Psychiatry: Judgement and  insight appear normal. Mood & affect appropriate.     Data Reviewed:   CBC: Recent Labs  Lab 03/08/18 1214 03/09/18 0713 03/10/18 0310 03/11/18 0317 03/12/18 0801  WBC 6.3 6.6 6.0 5.9 7.4  NEUTROABS 4.4  --   --   --   --   HGB 10.6* 11.2* 10.8* 10.1* 10.7*  HCT 31.3* 33.2* 31.7* 30.2* 33.0*  MCV 91.0 89.0 90.3 90.7 91.2  PLT 296 354 364 350 245*   Basic Metabolic  Panel: Recent Labs  Lab 03/08/18 1214 03/09/18 0713 03/10/18 0310 03/11/18 0317 03/12/18 0801  NA 131* 134* 132* 130* 131*  K 3.6 3.9 3.8 4.0 4.0  CL 99 97* 97* 98 96*  CO2 24 25 26 25 26   GLUCOSE 90 112* 130* 127* 148*  BUN 19 17 19 20 18   CREATININE 1.27* 1.29* 1.25* 1.18 1.17  CALCIUM 9.1 9.4 9.1 9.0 9.2   GFR: Estimated Creatinine Clearance: 51.2 mL/min (by C-G formula based on SCr of 1.17 mg/dL). Liver Function Tests: Recent Labs  Lab 03/08/18 1214  AST 40  ALT 25  ALKPHOS 49  BILITOT 0.7  PROT 6.5  ALBUMIN 2.8*   Recent Labs  Lab 03/08/18 1214  LIPASE 24   No results for input(s): AMMONIA in the last 168 hours. Coagulation Profile: Recent Labs  Lab 03/08/18 1353 03/10/18 1024  INR 1.14 1.05   Cardiac Enzymes: Recent Labs  Lab 03/08/18 1214 03/08/18 1642 03/09/18 0713  TROPONINI 6.00* 5.77* 4.44*   BNP (last 3 results) No results for input(s): PROBNP in the last 8760 hours. HbA1C: Recent Labs    03/09/18 1326  HGBA1C 7.1*   CBG: Recent Labs  Lab 03/11/18 2027 03/11/18 2345 03/12/18 0415 03/12/18 0820 03/12/18 1154  GLUCAP 174* 114* 168* 165* 176*   Lipid Profile: No results for input(s): CHOL, HDL, LDLCALC, TRIG, CHOLHDL, LDLDIRECT in the last 72 hours. Thyroid Function Tests: No results for input(s): TSH, T4TOTAL, FREET4, T3FREE, THYROIDAB in the last 72 hours. Anemia Panel: No results for input(s): VITAMINB12, FOLATE, FERRITIN, TIBC, IRON, RETICCTPCT in the last 72 hours. Urine analysis:    Component Value Date/Time   COLORURINE YELLOW 02/25/2018 [place] 02/25/2018 1157   LABSPEC 1.016 02/25/2018 1157   PHURINE 6.0 02/25/2018 1157   GLUCOSEU 50 (A) 02/25/2018 1157   HGBUR NEGATIVE 02/25/2018 1157   BILIRUBINUR NEGATIVE 02/25/2018 1157   KETONESUR NEGATIVE 02/25/2018 1157   PROTEINUR NEGATIVE 02/25/2018 1157   NITRITE NEGATIVE 02/25/2018 1157   LEUKOCYTESUR NEGATIVE 02/25/2018 1157   Sepsis  Labs: @LABRCNTIP (procalcitonin:4,lacticidven:4)  )No results found for this or any previous visit (from the past 240 hour(s)).       Radiology Studies: US Biopsy (abdominal Retropertioneal Mass)  Result Date: 03/10/2018 INDICATION: 77 year old with evidence for metastatic disease and needs a tissue diagnosis. Patient is extensive cervical lymphadenopathy, chest lymphadenopathy, right lung lesion and concern for abdominal metastatic disease. EXAM: ULTRASOUND-GUIDED CORE BIOPSY OF RIGHT CERVICAL LYMPH NODES MEDICATIONS: None. ANESTHESIA/SEDATION: Moderate (conscious) sedation was employed during this procedure. A total of Versed 1 mg and Fentanyl 25 mcg was administered intravenously. Moderate Sedation Time: 13 minutes. The patient's level of consciousness and vital signs were monitored continuously by radiology nursing throughout the procedure under my direct supervision. FLUOROSCOPY TIME:  None COMPLICATIONS: None immediate. PROCEDURE: Informed written consent was obtained from the patient after a thorough discussion of the procedural risks, benefits and alternatives. All questions were addressed. A timeout was performed prior to the initiation of  the procedure. Both sides of the neck were evaluated with ultrasound. Patient has enlarged abnormal lymph nodes on both sides of the neck. Lymphadenopathy appears to be greater on the right side where the patient actually has a palpable lesion. A cluster of abnormal lymph nodes was targeted for biopsy. The right side of the neck was prepped with chlorhexidine and sterile field was created. Skin was anesthetized with 1% lidocaine. Using ultrasound guidance, 18 gauge core needle was directed into this cluster of abnormal lymph nodes. Total of 5 core biopsies were obtained. Specimens placed in saline. Bandage placed over the puncture site. FINDINGS: Cluster of abnormal lymph nodes in the right lower neck. In addition, there are abnormal large lymph nodes on the  left side of the neck. Core biopsy needle confirmed within the abnormal lymph nodes. No evidence for hematoma formation or bleeding following the core biopsies. IMPRESSION: Successful ultrasound-guided core biopsies of abnormal right cervical lymph nodes. Electronically Signed   By: Markus Daft M.D.   On: 03/10/2018 16:07        Scheduled Meds: . aspirin EC  325 mg Oral Daily  . atorvastatin  20 mg Oral q1800  . bisacodyl  10 mg Rectal Once  . cholecalciferol  2,000 Units Oral Daily  . doxazosin  3 mg Oral QHS  . fenofibrate  160 mg Oral Daily  . heparin injection (subcutaneous)  5,000 Units Subcutaneous Q8H  . insulin aspart  0-9 Units Subcutaneous Q4H  . losartan  25 mg Oral Daily  . metoprolol succinate  50 mg Oral Daily  . omega-3 acid ethyl esters  2 g Oral Daily  . polyethylene glycol  17 g Oral Daily  . senna-docusate  1 tablet Oral BID  . tamsulosin  0.4 mg Oral Daily   Continuous Infusions:    LOS: 4 days    Time spent: 67min    Domenic Polite, MD [facility] Page via [email or web address], password [email or web address] After 7PM please contact night-coverage  03/12/2018, 12:54 PM

## 2018-03-12 NOTE — Progress Notes (Addendum)
Awaiting surgical biopsy results. Patient admitted with likely remote MI, drop in LVEF. Medical therapy for his MI due to new diagnosis of lung mass. Medical therapy with ASA, lopressor 50mg  bid, losartan 25, atorva 20. LDL 39 on low dose statin, have not changed to high dose due to LDL already in the 30s. Would change lopressor to Toprol XL 50mg  daily. Continue medical therapy at this time, would not pursue cath in absence of recurrent chest pain or hemodynamica or electrical instability until work up and prognosis for likely lung cancer is clear. ASA 81mg  daily would be fine from cardiac stanpoint.    Carlyle Dolly MD

## 2018-03-12 NOTE — Evaluation (Signed)
Occupational Therapy Evaluation Patient Details Name: Arthur [name] MRN: [id number] DOB: September 11, 1940 Today's Date: 03/12/2018    History of Present Illness Pt presented to [facility] with rt flank pain related to known kidney stone. Pt found to have elevated troponin and thought to be NSTEMI and transferred to [facility]. Work up found rt lung mass thought to be metastatic lung CA with biopsy pending. PMH - CAD, DM, copd, CVA, kidney stones, PAD   Clinical Impression   Patient evaluated by Occupational Therapy with no further acute OT needs identified. He is able to complete all ADL independently in hospital setting at this time including LB ADL, standing grooming tasks, toilet transfers, and walk-in shower transfers. Educated pt concerning activity progression and energy conservation strategies. Recommended that pt maintain activity levels and frequently ambulate while in hospital. All education has been completed and the patient has no further questions. See below for any follow-up Occupational Therapy or equipment needs. OT to sign off. Thank you for referral.      Follow Up Recommendations  No OT follow up    Equipment Recommendations  None recommended by OT    Recommendations for Other Services       Precautions / Restrictions Precautions Precautions: None Restrictions Weight Bearing Restrictions: No      Mobility Bed Mobility Overal bed mobility: Independent                Transfers Overall transfer level: Independent                    Balance Overall balance assessment: Mild deficits observed, not formally tested                                         ADL either performed or assessed with clinical judgement   ADL Overall ADL's : Independent                                       General ADL Comments: Able to complete all ADL and ADL transfers slowly but independently. Educated concerning activity progression and  recommendation to be as independent and active as possible to improve strength in hospital setting.       Vision Baseline Vision/History: Wears glasses Patient Visual Report: No change from baseline Vision Assessment?: No apparent visual deficits     Perception     Praxis      Pertinent Vitals/Pain Pain Assessment: Faces Faces Pain Scale: Hurts little more Pain Location: R flank  Pain Descriptors / Indicators: Discomfort;Constant Pain Intervention(s): Limited activity within patient's tolerance;Monitored during session;Repositioned     Hand Dominance Right   Extremity/Trunk Assessment Upper Extremity Assessment Upper Extremity Assessment: Overall WFL for tasks assessed   Lower Extremity Assessment Lower Extremity Assessment: Overall WFL for tasks assessed       Communication Communication Communication: HOH   Cognition Arousal/Alertness: Awake/alert Behavior During Therapy: WFL for tasks assessed/performed Overall Cognitive Status: Within Functional Limits for tasks assessed                                     General Comments  Educated pt concerning activity progression and energy conservation for home.     Exercises     Shoulder  Instructions      Home Living Family/patient expects to be discharged to:: Private residence Living Arrangements: Alone Available Help at Discharge: Family;Available PRN/intermittently Type of Home: House       Home Layout: One level     Bathroom Shower/Tub: Occupational psychologist: Standard     Home Equipment: Grab bars - tub/shower          Prior Functioning/Environment Level of Independence: Independent        Comments: Independent in ADL and IADL. Wife is in SNF and pt spends time there daily. Does all grocery shopping and home management.         OT Problem List: Impaired balance (sitting and/or standing);Cardiopulmonary status limiting activity      OT Treatment/Interventions:       OT Goals(Current goals can be found in the care plan section) Acute Rehab OT Goals Patient Stated Goal: get everything figured out before I go home OT Goal Formulation: With patient  OT Frequency:     Barriers to D/C:            Co-evaluation              AM-PAC PT "6 Clicks" Daily Activity     Outcome Measure Help from another person eating meals?: None Help from another person taking care of personal grooming?: None Help from another person toileting, which includes using toliet, bedpan, or urinal?: None Help from another person bathing (including washing, rinsing, drying)?: None Help from another person to put on and taking off regular upper body clothing?: None Help from another person to put on and taking off regular lower body clothing?: None 6 Click Score: 24   End of Session Nurse Communication: Mobility status  Activity Tolerance: Patient tolerated treatment well Patient left: in bed;with call bell/phone within reach(seated at EOB)  OT Visit Diagnosis: Other abnormalities of gait and mobility (R26.89)                Time: 2334-3568 OT Time Calculation (min): 11 min Charges:  OT General Charges $OT Visit: 1 Visit OT Evaluation $OT Eval Low Complexity: 1 Low  Arthur Herrlich, MS OTR/L  Pager: [place] Arthur [name] 03/12/2018, 5:46 PM

## 2018-03-13 LAB — GLUCOSE, CAPILLARY
GLUCOSE-CAPILLARY: 144 mg/dL — AB (ref 70–99)
Glucose-Capillary: 123 mg/dL — ABNORMAL HIGH (ref 70–99)
Glucose-Capillary: 162 mg/dL — ABNORMAL HIGH (ref 70–99)
Glucose-Capillary: 137 mg/dL — ABNORMAL HIGH (ref 70–99)

## 2018-03-13 MED ORDER — BISACODYL 10 MG RE SUPP
10.0000 mg | Freq: Once | RECTAL | Status: AC
  Administered 2018-03-13: 10 mg via RECTAL
  Filled 2018-03-13: qty 1

## 2018-03-13 NOTE — Progress Notes (Addendum)
Progress Note  Patient Name: Arthur [name] Date of Encounter: 03/13/2018  Primary Cardiologist: Quay Burow, MD   Subjective   No chest pain. Mild SOB.   Inpatient Medications    Scheduled Meds: . aspirin EC  325 mg Oral Daily  . atorvastatin  20 mg Oral q1800  . bisacodyl  10 mg Rectal Once  . cholecalciferol  2,000 Units Oral Daily  . doxazosin  3 mg Oral QHS  . fenofibrate  160 mg Oral Daily  . heparin injection (subcutaneous)  5,000 Units Subcutaneous Q8H  . insulin aspart  0-9 Units Subcutaneous Q4H  . losartan  25 mg Oral Daily  . metoprolol succinate  50 mg Oral Daily  . omega-3 acid ethyl esters  2 g Oral Daily  . polyethylene glycol  17 g Oral Daily  . senna-docusate  1 tablet Oral BID  . tamsulosin  0.4 mg Oral Daily   Continuous Infusions:  PRN Meds: acetaminophen, HYDROcodone-acetaminophen, HYDROmorphone (DILAUDID) injection, magnesium hydroxide, nitroGLYCERIN, ondansetron (ZOFRAN) IV   Vital Signs    Vitals:   03/12/18 1751 03/12/18 2023 03/12/18 2348 03/13/18 0405  BP: 127/77 139/76 119/72 (!) 154/78  Pulse: 83 89 82 91  Resp: 18 15 15 20   Temp: 98.1 F (36.7 C) 98.3 F (36.8 C) 98.4 F (36.9 C) 98.1 F (36.7 C)  TempSrc: Oral Oral Oral Oral  SpO2: 96% 94% 96% 96%  Weight:      Height:        Intake/Output Summary (Last 24 hours) at 03/13/2018 0800 Last data filed at 03/12/2018 1700 Gross per 24 hour  Intake 240 ml  Output -  Net 240 ml   Filed Weights   03/08/18 1158 03/08/18 1845  Weight: 80.7 kg 80.7 kg    Telemetry    SR- Personally Reviewed  ECG  na  Physical Exam   GEN: No acute distress.   Neck: No JVD Cardiac: RRR, no murmurs, rubs, or gallops.  Respiratory: Clear to auscultation bilaterally. GI: Soft, nontender, non-distended  MS: No edema; No deformity. Neuro:  Nonfocal  Psych: Normal affect   Labs    Chemistry Recent Labs  Lab 03/08/18 1214  03/10/18 0310 03/11/18 0317 03/12/18 0801  NA 131*   < >  132* 130* 131*  K 3.6   < > 3.8 4.0 4.0  CL 99   < > 97* 98 96*  CO2 24   < > 26 25 26   GLUCOSE 90   < > 130* 127* 148*  BUN 19   < > 19 20 18   CREATININE 1.27*   < > 1.25* 1.18 1.17  CALCIUM 9.1   < > 9.1 9.0 9.2  PROT 6.5  --   --   --   --   ALBUMIN 2.8*  --   --   --   --   AST 40  --   --   --   --   ALT 25  --   --   --   --   ALKPHOS 49  --   --   --   --   BILITOT 0.7  --   --   --   --   GFRNONAA 52*   < > 53* 57* 57*  GFRAA >60   < > >60 >60 >60  ANIONGAP 8   < > 9 7 9    < > = values in this interval not displayed.     Hematology Recent Labs  Lab 03/10/18 0310 03/11/18 0317 03/12/18 0801  WBC 6.0 5.9 7.4  RBC 3.51* 3.33* 3.62*  HGB 10.8* 10.1* 10.7*  HCT 31.7* 30.2* 33.0*  MCV 90.3 90.7 91.2  MCH 30.8 30.3 29.6  MCHC 34.1 33.4 32.4  RDW 13.1 13.1 13.3  PLT 364 350 421*    Cardiac Enzymes Recent Labs  Lab 03/08/18 1214 03/08/18 1642 03/09/18 0713  TROPONINI 6.00* 5.77* 4.44*   No results for input(s): TROPIPOC in the last 168 hours.   BNP Recent Labs  Lab 03/08/18 1214  BNP 734.0*     DDimer No results for input(s): DDIMER in the last 168 hours.   Radiology    No results found.  Cardiac Studies    Patient Profile     77 y.o. male with a history of CADwith prior PCI to LCX in 1997, last stress in 2012 neg for ischemia, PAD, DM2,HTN, HLDpresents with 1 month of shortness of breath to [facility]. He had elevated troponin and was transferred to [facility] for cardiac cath. After transfer he had CT of chest with results as above.   Assessment & Plan    1. NSTEMI/ICM - initial trop 6, trending down. Based on symptoms some thought he may have had a remote MI over the last 1-2 weeks - have not pursued cath as patient also with new diagnosis of a lung mass suspicious for cancer, he is s/p biopsy. CT would suggest possible extensive mets.  - echo LVEF 20-25%, grade I diastolic dysfunction. Akinesis inferior/inferseoptal walls. No prior echo for  comparison - medical therapy with ASA, atorva 20, losartan 25, Toprol 50mg . Pending clinical course with cath or not could consider adding plavix for medically managed ACS  - if metastatic lung CA is confirmed would lean toward medical therapy for his ACS in the absence of ongoing chest pain, hemodynamic or electrical instability. If cancer confirmed, staged, and prognosis defined over time could reconsider cardiac management.   2. Lung mass - CT chest with 5 cm RUL mass suspicious for bronchogenic carcinoma. Bulk adenopathy in chest, neck, abdomen and pelvis, adrenal mass - s/p biopsy, awaiting results.    3. Kidney stone - initially scheduled for ureteroscopy 03/18/18, at this time would recommend postponing  - discussed with Dr Collene Mares. For cystoscopy/ureteroscopy patient would need general anesthesia but would be able to stay on DAPT. Procedure is minimally invasive.  I think if patient remains stable without recurrence of ischemia would be reasonable to consider within the next 3 weeks. Patient with significant ongoing flank pain and really wants to have procedure done for symptoms.     For questions or updates, please contact [place] Please consult [email or web address] for contact info under Cardiology/STEMI.      Merrily Pew, MD  03/13/2018, 8:00 AM

## 2018-03-13 NOTE — Progress Notes (Signed)
PROGRESS NOTE    Vasco Chong  KGY:[id number] DOB: [birth year] DOA: 03/08/2018 PCP: Dione Housekeeper, MD  Brief Narrative: 77 year old male with history of CAD, diabetes mellitus type 2, PAD, long-term tobacco abuse and suspected COPD, CVA presented to the ED with [name] yesterday with right flank pain, suspected to be related to his kidney stone, he sees Dr. Tresa Moore with urology and is supposed to have a urological procedure later this month. -in the ED at [facility] he was found to have a troponin over 6,NSTEMI, subsequently transferred to [facility] for left heart catheterization -found to have Large RUL Lung mass with metastatic lesions, s/p Supraclav LN biopsy -Cards following  Assessment & Plan:     NSTEMI (non-ST elevated myocardial infarction) ([place]) -asymptomatic, remote history of PCI to left circumflex in 1997 -admitted with significant troponin elevation and EKG without acute changes, peak troponin was 6, trended down to 4.4 , suspected ACS event was 2 weeks ago, he had symptoms at home -Continue aspirin, Lopressor, atorvastatin, off heparin drip now -2-D echocardiogram with drop in EF to 35-40% and inferior wall motion abnormalities -Cardiology following, plan for further evaluation pending workup for large new lung mass, suspected metastatic Lung CA    Right upper lobe lung mass -5 cm right upper lobe, w/ 65+ years of smoking highly suspicious for bronchogenic CA -Underwent US guided Biopsy of Large R Cervical Lymph node by IR 8/22 -Called and D/w Oncology Dr.Shadad, he felt there isn't much to add inpatient and recommended Outpatient Oncology Follow up, once biopsy confirms likely metastatic Lung CA    Right flank pain -Has an 8 mm right UVJ calculus -Continue Flomax, continue Hydrocodone PRN -d/w Dr.Manny not appropriate for surgery in setting of recent MI, await Cards input    Diabetes mellitus -home medication lists high-dose lispro twice a day, very unusual  regimen -hemoglobin A1c is 7.1, diabetes coordinator consult, continue sliding scale insulin for now -could transition to insulin 70/30 at discharge    Tobacco abuse -counseled    CKD 2  -stable   Constipation -continue miralax senokot, dulcolax and added enema if needed  DVT prophylaxis: heparin SQ Code Status: full code Family Communication: no family at bedside Disposition Plan: pending cardiac and malignancy evaluation  Consultants:  Cardiology IR D/w Oncology Dr.Shadad   Procedures:   Antimicrobials:    Subjective: -c/o R flank discomfort persists  Objective: Vitals:   03/13/18 0405 03/13/18 0845 03/13/18 1020 03/13/18 1206  BP: (!) 154/78 (!) 141/66  136/68  Pulse: 91 95  (!) 104  Resp: 20 18 15 18   Temp: 98.1 F (36.7 C) (!) 97.5 F (36.4 C)  98 F (36.7 C)  TempSrc: Oral Oral  Oral  SpO2: 96% 98%  93%  Weight:      Height:        Intake/Output Summary (Last 24 hours) at 03/13/2018 1310 Last data filed at 03/13/2018 1207 Gross per 24 hour  Intake 720 ml  Output -  Net 720 ml   Filed Weights   03/08/18 1158 03/08/18 1845  Weight: 80.7 kg 80.7 kg    Examination:  Gen: Awake, Alert, Oriented X 3,  HEENT: PERRLA, Neck supple, no JVD Lungs: Good air movement bilaterally, CTAB CVS: RRR,No Gallops,Rubs or new Murmurs Abd: soft, Non tender, non distended, BS present Extremities: No edema Psychiatry: Judgement and insight appear normal. Mood & affect appropriate.     Data Reviewed:   CBC: Recent Labs  Lab 03/08/18 1214 03/09/18 0263  03/10/18 0310 03/11/18 0317 03/12/18 0801  WBC 6.3 6.6 6.0 5.9 7.4  NEUTROABS 4.4  --   --   --   --   HGB 10.6* 11.2* 10.8* 10.1* 10.7*  HCT 31.3* 33.2* 31.7* 30.2* 33.0*  MCV 91.0 89.0 90.3 90.7 91.2  PLT 296 354 364 350 144*   Basic Metabolic Panel: Recent Labs  Lab 03/08/18 1214 03/09/18 0713 03/10/18 0310 03/11/18 0317 03/12/18 0801  NA 131* 134* 132* 130* 131*  K 3.6 3.9 3.8 4.0 4.0  CL  99 97* 97* 98 96*  CO2 24 25 26 25 26   GLUCOSE 90 112* 130* 127* 148*  BUN 19 17 19 20 18   CREATININE 1.27* 1.29* 1.25* 1.18 1.17  CALCIUM 9.1 9.4 9.1 9.0 9.2   GFR: Estimated Creatinine Clearance: 51.2 mL/min (by C-G formula based on SCr of 1.17 mg/dL). Liver Function Tests: Recent Labs  Lab 03/08/18 1214  AST 40  ALT 25  ALKPHOS 49  BILITOT 0.7  PROT 6.5  ALBUMIN 2.8*   Recent Labs  Lab 03/08/18 1214  LIPASE 24   No results for input(s): AMMONIA in the last 168 hours. Coagulation Profile: Recent Labs  Lab 03/08/18 1353 03/10/18 1024  INR 1.14 1.05   Cardiac Enzymes: Recent Labs  Lab 03/08/18 1214 03/08/18 1642 03/09/18 0713  TROPONINI 6.00* 5.77* 4.44*   BNP (last 3 results) No results for input(s): PROBNP in the last 8760 hours. HbA1C: No results for input(s): HGBA1C in the last 72 hours. CBG: Recent Labs  Lab 03/12/18 1616 03/12/18 2020 03/12/18 2350 03/13/18 0406 03/13/18 1201  GLUCAP 150* 161* 130* 123* 162*   Lipid Profile: No results for input(s): CHOL, HDL, LDLCALC, TRIG, CHOLHDL, LDLDIRECT in the last 72 hours. Thyroid Function Tests: No results for input(s): TSH, T4TOTAL, FREET4, T3FREE, THYROIDAB in the last 72 hours. Anemia Panel: No results for input(s): VITAMINB12, FOLATE, FERRITIN, TIBC, IRON, RETICCTPCT in the last 72 hours. Urine analysis:    Component Value Date/Time   COLORURINE YELLOW 02/25/2018 [place] 02/25/2018 1157   LABSPEC 1.016 02/25/2018 1157   PHURINE 6.0 02/25/2018 1157   GLUCOSEU 50 (A) 02/25/2018 1157   HGBUR NEGATIVE 02/25/2018 1157   BILIRUBINUR NEGATIVE 02/25/2018 1157   KETONESUR NEGATIVE 02/25/2018 1157   PROTEINUR NEGATIVE 02/25/2018 1157   NITRITE NEGATIVE 02/25/2018 1157   LEUKOCYTESUR NEGATIVE 02/25/2018 1157   Sepsis Labs: @LABRCNTIP (procalcitonin:4,lacticidven:4)  )No results found for this or any previous visit (from the past 240 hour(s)).       Radiology Studies: No  results found.      Scheduled Meds: . aspirin EC  325 mg Oral Daily  . atorvastatin  20 mg Oral q1800  . bisacodyl  10 mg Rectal Once  . cholecalciferol  2,000 Units Oral Daily  . doxazosin  3 mg Oral QHS  . fenofibrate  160 mg Oral Daily  . heparin injection (subcutaneous)  5,000 Units Subcutaneous Q8H  . insulin aspart  0-9 Units Subcutaneous Q4H  . losartan  25 mg Oral Daily  . metoprolol succinate  50 mg Oral Daily  . omega-3 acid ethyl esters  2 g Oral Daily  . polyethylene glycol  17 g Oral Daily  . senna-docusate  1 tablet Oral BID  . tamsulosin  0.4 mg Oral Daily   Continuous Infusions:    LOS: 5 days    Time spent: 62min    Domenic Polite, MD [facility] Page via [email or web address], password [email or web address] After 7PM please  contact night-coverage  03/13/2018, 1:10 PM

## 2018-03-14 ENCOUNTER — Inpatient Hospital Stay (HOSPITAL_COMMUNITY): Admission: RE | Admit: 2018-03-14 | Payer: Medicare Other | Source: Ambulatory Visit

## 2018-03-14 DIAGNOSIS — Z79899 Other long term (current) drug therapy: Secondary | ICD-10-CM

## 2018-03-14 DIAGNOSIS — C3411 Malignant neoplasm of upper lobe, right bronchus or lung: Secondary | ICD-10-CM

## 2018-03-14 DIAGNOSIS — I2510 Atherosclerotic heart disease of native coronary artery without angina pectoris: Secondary | ICD-10-CM

## 2018-03-14 DIAGNOSIS — N200 Calculus of kidney: Secondary | ICD-10-CM

## 2018-03-14 DIAGNOSIS — I313 Pericardial effusion (noninflammatory): Secondary | ICD-10-CM

## 2018-03-14 DIAGNOSIS — C7971 Secondary malignant neoplasm of right adrenal gland: Secondary | ICD-10-CM

## 2018-03-14 DIAGNOSIS — R918 Other nonspecific abnormal finding of lung field: Secondary | ICD-10-CM

## 2018-03-14 DIAGNOSIS — J90 Pleural effusion, not elsewhere classified: Secondary | ICD-10-CM

## 2018-03-14 DIAGNOSIS — I129 Hypertensive chronic kidney disease with stage 1 through stage 4 chronic kidney disease, or unspecified chronic kidney disease: Secondary | ICD-10-CM

## 2018-03-14 DIAGNOSIS — C7801 Secondary malignant neoplasm of right lung: Secondary | ICD-10-CM

## 2018-03-14 DIAGNOSIS — Z87891 Personal history of nicotine dependence: Secondary | ICD-10-CM

## 2018-03-14 DIAGNOSIS — E119 Type 2 diabetes mellitus without complications: Secondary | ICD-10-CM

## 2018-03-14 DIAGNOSIS — Z87442 Personal history of urinary calculi: Secondary | ICD-10-CM

## 2018-03-14 DIAGNOSIS — E875 Hyperkalemia: Secondary | ICD-10-CM

## 2018-03-14 DIAGNOSIS — C778 Secondary and unspecified malignant neoplasm of lymph nodes of multiple regions: Secondary | ICD-10-CM

## 2018-03-14 DIAGNOSIS — M129 Arthropathy, unspecified: Secondary | ICD-10-CM

## 2018-03-14 DIAGNOSIS — N189 Chronic kidney disease, unspecified: Secondary | ICD-10-CM

## 2018-03-14 DIAGNOSIS — M899 Disorder of bone, unspecified: Secondary | ICD-10-CM

## 2018-03-14 DIAGNOSIS — E785 Hyperlipidemia, unspecified: Secondary | ICD-10-CM

## 2018-03-14 LAB — CBC
HCT: 31.5 % — ABNORMAL LOW (ref 39.0–52.0)
HEMOGLOBIN: 10.4 g/dL — AB (ref 13.0–17.0)
MCH: 29.9 pg (ref 26.0–34.0)
MCHC: 33 g/dL (ref 30.0–36.0)
MCV: 90.5 fL (ref 78.0–100.0)
Platelets: 370 10*3/uL (ref 150–400)
RBC: 3.48 MIL/uL — AB (ref 4.22–5.81)
RDW: 13.2 % (ref 11.5–15.5)
WBC: 8 10*3/uL (ref 4.0–10.5)

## 2018-03-14 LAB — BASIC METABOLIC PANEL
Anion gap: 9 (ref 5–15)
BUN: 15 mg/dL (ref 8–23)
CHLORIDE: 92 mmol/L — AB (ref 98–111)
CO2: 25 mmol/L (ref 22–32)
CREATININE: 1.19 mg/dL (ref 0.61–1.24)
Calcium: 9.3 mg/dL (ref 8.9–10.3)
GFR calc Af Amer: >60 mL/min (ref >60)
GFR calc non Af Amer: 56 mL/min — ABNORMAL LOW (ref >60)
GLUCOSE: 126 mg/dL — AB (ref 70–99)
POTASSIUM: 4.1 mmol/L (ref 3.5–5.1)
SODIUM: 126 mmol/L — AB (ref 135–145)

## 2018-03-14 LAB — GLUCOSE, CAPILLARY
GLUCOSE-CAPILLARY: 121 mg/dL — AB (ref 70–99)
GLUCOSE-CAPILLARY: 128 mg/dL — AB (ref 70–99)
GLUCOSE-CAPILLARY: 132 mg/dL — AB (ref 70–99)
GLUCOSE-CAPILLARY: 143 mg/dL — AB (ref 70–99)

## 2018-03-14 MED ORDER — BISACODYL 10 MG RE SUPP: 10.0000 mg | Freq: Once | RECTAL | Status: DC

## 2018-03-14 MED ORDER — LACTULOSE 10 GM/15ML PO SOLN
20.0000 g | Freq: Three times a day (TID) | ORAL | Status: AC
  Administered 2018-03-14 – 2018-03-15 (×2): 20 g via ORAL
  Filled 2018-03-14 (×3): qty 30

## 2018-03-14 MED ORDER — POLYETHYLENE GLYCOL 3350 17 G PO PACK
17.0000 g | PACK | Freq: Two times a day (BID) | ORAL | Status: DC
  Administered 2018-03-14 – 2018-03-16 (×3): 17 g via ORAL
  Filled 2018-03-14 (×3): qty 1

## 2018-03-14 MED ORDER — ISOSORBIDE MONONITRATE ER 30 MG PO TB24
30.0000 mg | ORAL_TABLET | Freq: Every day | ORAL | Status: DC
  Administered 2018-03-14 – 2018-03-16 (×3): 30 mg via ORAL
  Filled 2018-03-14 (×3): qty 1

## 2018-03-14 NOTE — Progress Notes (Addendum)
The patient has been seen in conjunction with Vin Bhagat, PA-C. All aspects of care have been considered and discussed. The patient has been personally interviewed, examined, and all clinical data has been reviewed.   Complicated clinical situation.  Recent non-ST elevation MI with decreased LV function/EF less than 40%.  No symptoms of angina/chest pain.  Concomitant diagnosis of small cell carcinoma of the lung with multiple metastases.  Given the current clinical scenario, including absence of ischemic symptoms, medical management of non-ST elevation MI seems most appropriate.  Should he develop angina/ischemic symptoms, coronary angiography could be considered.  If coronary angiography/PCI dual antiplatelet therapy could compromise diagnostic/therapeutic options for up to 6 to 12 months.  Overall prognosis related to lung cancer is unknown at this time.  Plan aspirin, high intensity statin therapy, beta-blocker therapy, and his blood pressure will tolerate, long-acting nitrates.    Progress Note  Patient Name: Arthur [name] Date of Encounter: 03/14/2018  Primary Cardiologist: Quay Burow, MD   Subjective   Feeling well. No chest pain, sob or palpitations.   Inpatient Medications    Scheduled Meds: . aspirin EC  325 mg Oral Daily  . atorvastatin  20 mg Oral q1800  . bisacodyl  10 mg Rectal Once  . cholecalciferol  2,000 Units Oral Daily  . doxazosin  3 mg Oral QHS  . fenofibrate  160 mg Oral Daily  . heparin injection (subcutaneous)  5,000 Units Subcutaneous Q8H  . insulin aspart  0-9 Units Subcutaneous Q4H  . losartan  25 mg Oral Daily  . metoprolol succinate  50 mg Oral Daily  . omega-3 acid ethyl esters  2 g Oral Daily  . polyethylene glycol  17 g Oral Daily  . senna-docusate  1 tablet Oral BID  . tamsulosin  0.4 mg Oral Daily   Continuous Infusions:  PRN Meds: acetaminophen, HYDROcodone-acetaminophen, HYDROmorphone (DILAUDID) injection, magnesium hydroxide,  nitroGLYCERIN, ondansetron (ZOFRAN) IV   Vital Signs    Vitals:   03/13/18 2038 03/14/18 0009 03/14/18 0430 03/14/18 0849  BP: (!) 141/85 134/76 126/71 116/68  Pulse: 88 82 91 91  Resp: 17 14 17    Temp: 98.5 F (36.9 C) 98.2 F (36.8 C) 98 F (36.7 C)   TempSrc: Oral Oral Oral   SpO2: 93% 96% 95%   Weight:   75.6 kg   Height:        Intake/Output Summary (Last 24 hours) at 03/14/2018 1025 Last data filed at 03/14/2018 0430 Gross per 24 hour  Intake 480 ml  Output 500 ml  Net -20 ml   Filed Weights   03/08/18 1158 03/08/18 1845 03/14/18 0430  Weight: 80.7 kg 80.7 kg 75.6 kg    Telemetry    SR with rare PVC- Personally Reviewed  ECG    N/A  Physical Exam   GEN: No acute distress.   Neck: No JVD Cardiac: RRR, no murmurs, rubs, or gallops.  Respiratory: Clear to auscultation bilaterally. GI: Soft, nontender, non-distended  MS: No edema; No deformity. Neuro:  Nonfocal  Psych: Normal affect   Labs    Chemistry Recent Labs  Lab 03/08/18 1214  03/11/18 0317 03/12/18 0801 03/14/18 0404  NA 131*   < > 130* 131* 126*  K 3.6   < > 4.0 4.0 4.1  CL 99   < > 98 96* 92*  CO2 24   < > 25 26 25   GLUCOSE 90   < > 127* 148* 126*  BUN 19   < >  20 18 15   CREATININE 1.27*   < > 1.18 1.17 1.19  CALCIUM 9.1   < > 9.0 9.2 9.3  PROT 6.5  --   --   --   --   ALBUMIN 2.8*  --   --   --   --   AST 40  --   --   --   --   ALT 25  --   --   --   --   ALKPHOS 49  --   --   --   --   BILITOT 0.7  --   --   --   --   GFRNONAA 52*   < > 57* 57* 56*  GFRAA >60   < > >60 >60 >60  ANIONGAP 8   < > 7 9 9    < > = values in this interval not displayed.     Hematology Recent Labs  Lab 03/11/18 0317 03/12/18 0801 03/14/18 0404  WBC 5.9 7.4 8.0  RBC 3.33* 3.62* 3.48*  HGB 10.1* 10.7* 10.4*  HCT 30.2* 33.0* 31.5*  MCV 90.7 91.2 90.5  MCH 30.3 29.6 29.9  MCHC 33.4 32.4 33.0  RDW 13.1 13.3 13.2  PLT 350 421* 370    Cardiac Enzymes Recent Labs  Lab 03/08/18 1214  03/08/18 1642 03/09/18 0713  TROPONINI 6.00* 5.77* 4.44*   No results for input(s): TROPIPOC in the last 168 hours.   BNP Recent Labs  Lab 03/08/18 1214  BNP 734.0*    Radiology    No results found.  Cardiac Studies   Echo 03/09/18 Study Conclusions  - Left ventricle: The cavity size was normal. Systolic function was   moderately reduced. The estimated ejection fraction was in the   range of 35% to 40%. Diffuse hypokinesis. There is akinesis of   the inferior and inferoseptal myocardium. Doppler parameters are   consistent with abnormal left ventricular relaxation (grade 1   diastolic dysfunction). - Aortic valve: Trileaflet; mildly thickened, mildly calcified   leaflets. - Pericardium, extracardiac: A small pericardial effusion was   identified circumferential to the heart. There was no evidence of   hemodynamic compromise.  Impressions:  - EF is decreased when compared to prior.  CT of Chest 03/08/18 IMPRESSION: 1. RIGHT UPPER lobe mass measuring 5.0 centimeters and suspicious for bronchogenic carcinoma. 2. Bulky adenopathy in the superior mediastinum, LOWER neck, supraclavicular region, mediastinum, and RIGHT hilar regions. 3. RIGHT pleural effusion and pleural thickening. 4. UPPER abdominal adenopathy/masses. Recommend further evaluation with CT of the abdomen and pelvis. Intravenous contrast is recommended unless contraindicated. Recommend renal protocol if possible. 5. Pericardial effusion. 6. LEFT adrenal mass 1.9 centimeters. 7. Suspect subacute fracture of the RIGHT scapula which may be a pathologic fracture. 8. Bilateral renal masses are indeterminate. 9. Cholelithiasis. 10.  Aortic Atherosclerosis (ICD10-I70.0). 11. Aortic aneurysm NOS (ICD10-I71.9). Ascending aorta is 4.0 centimeters. Recommend annual imaging followup by CTA or MRA. This recommendation follows 2010 ACCF/AHA/AATS/ACR/ASA/SCA/SCAI/SIR/STS/SVM Guidelines for the Diagnosis and  Management of Patients with Thoracic Aortic Disease. Circulation. 2010; 121: X833-A250   Patient Profile     77 y.o. male a history of CADwith prior PCI to LCX in 1997, last stress in 2012 neg for ischemia, PAD, DM2,HTN, HLDpresents with 1 month of shortness of breath to [facility]. He had elevated troponin and was transferred to [facility] for cardiac cath. After transfer he had CT of chest with results as above.   Assessment & Plan    1.  NSTEMI - Peak of troponin 6 then trended down. Based on symptoms some thought he may have had a remote MI over the last 1-2 weeks. - Did not taken to cath lab due to  ew diagnosis of a lung mass suspicious for cancer, he is s/p biopsy (pending result). CT would suggest possible extensive mets.  - Echo with newly down LV function and WM abnormality.  - pending final recommendations due to uncertain clinic course.  - Continue medical therapy with ASA (reduce to 81mg  qd), atorva 20, losartan 25, Toprol 50mg  - ? Add plavix per prior note  2. ICM - echo LVEF 71-24%, grade I diastolic dysfunction. Akinesis inferior/inferseoptal walls. No prior echo for comparison - Continue BB and ARB.  - Euvolemic  3. Lung mass - CT chest with 5 cm RUL mass suspicious for bronchogenic carcinoma. Bulk adenopathy in chest, neck, abdomen and pelvis, adrenal mass - Waiting biopsy result.  - Plan to follow up with oncology as outpatient per primary's note  4. R flank pain/ surgical clearance - Difficult situation given recent remote MI. Has an 8 mm right UVJ calculus. He continues to have ongoing pain.   Per Dr. Harl Bowie "discussed with Dr Tresa Moore. For cystoscopy/ureteroscopy patient would need general anesthesia but would be able to stay on DAPT. Procedure is minimally invasive.  I think if patient remains stable without recurrence of ischemia would be reasonable to consider within the next 3 weeks. Patient with significant ongoing flank pain and really wants to have procedure  done for symptoms".   Further recommendations per Dr. Tamala Julian.     For questions or updates, please contact [place] Please consult [email or web address] for contact info under Cardiology/STEMI.      Jarrett Soho, PA  03/14/2018, 10:25 AM

## 2018-03-14 NOTE — Consult Note (Signed)
[place] [phone]) [phone]   [phone]  CONSULT NOTE  REFERRING PHYSICIAN: DR. Domenic Polite  REASON FOR CONSULTATION:  77 years old white male recently diagnosed with lung cancer.  HPI Arthur [name] is a 77 y.o. male with past medical history significant for hypertension, diabetes mellitus, dyslipidemia, chronic kidney disease, coronary artery disease, prefer arterial disease, history of kidney stone, hearing deficit as well as long history of smoking but quit 2 months ago.  The patient mentions that he has been complaining of shortness of breath for few weeks.  He was seen by his primary care physician and treated with antibiotics with no improvement in his symptoms.  On 03/08/2016 he presented to [facility] complaining of worsening dyspnea.  He was evaluated by cardiology because of elevated troponin of 6 and he was diagnosed with NSTEMI.  The patient was transferred to [facility] for further evaluation.  Chest x-ray on the day of admission showed worsening of the small right effusion with persistent airspace opacity inferiorly in the right upper lobe slightly more conspicuous than on the previous study of February 25, 2018.  This was followed by CT scan of the chest without contrast on the same day and that showed nodular mass within the right upper lobe measuring 5.0 x 2.8 cm.  The mass is contiguous with the right hilar adenopathy.  There was also soft tissue in the region of the right middle lobe measuring 2.3 x 5.9 cm contagious with the hilar adenopathy.  The scan also showed right pleural effusion.  There was also numerous bulky lymph nodes identified in the superior mediastinum and right paratracheal region the largest measured 2.2 cm.  There was a prevascular lymph node measuring 4.7 x 2.8 cm, precarinal lymph node measuring 3.0 cm, subcarinal mass measuring 2.3 cm.  There was also enlarged right hilar lymph node associated with narrowing of  the right central airway and soft tissue mass in the right hilum extending into the right upper lobe.  The scan also showed pericardial effusion.  There are also enlarged nodes identified in the lower neck bilaterally and in the supraclavicular region.  The scan also showed upper abdominal adenopathy masses as well as left adrenal mass measuring 1.9 cm and suspected subacute fracture of the right scapula concerning to be pathologic in nature. On 03/10/2018 the patient underwent ultrasound-guided core biopsy of the right cervical lymph nodes. The preliminary pathology was reported to be small cell carcinoma. I was asked by Dr. Broadus John to see the patient today for evaluation and recommendation regarding treatment of his condition. When seen today the patient is feeling better with less shortness of breath.  He denied having any chest pain but continues to have pain in the right flank area secondary to kidney stone.  He was seen by Dr. Tresa Moore for evaluation of this condition.  He has no cough or hemoptysis.  He denied having any recent weight loss or night sweats.  He has no headache or visual changes. Family history is unremarkable for malignancy. The patient is married and his wife has been a resident of a skilled nursing facility for the last 13 years.  He is the only caregiver for her.  He has 2 children living in [place].  He also has an adopted daughter and her daughter lives in [place].  He has a history of his smoking 1 pack/day for around 60 years and quit 2 months ago.  He has  no history of alcohol or drug abuse. HPI  Past Medical History:  Diagnosis Date  . Arthritis   . Chronic kidney disease   . Claudication ([place])   . Coronary artery disease 1997   CFX PCI, '97. Mod residual RI and RCA dis.  . Diabetes mellitus ([place])    IDDM  . Dyslipidemia   . Hard of hearing   . History of bronchitis   . History of kidney stones 1958  . HTN (hypertension)   . PAD (peripheral  artery disease) ([place])   . Pneumonia   . Tobacco abuse   . Urinary incontinence   . Wears glasses     Past Surgical History:  Procedure Laterality Date  . APPENDECTOMY     "busted on me"  . COLONOSCOPY    . CORONARY ANGIOPLASTY  1997   CFX  . ENDARTERECTOMY FEMORAL Right 05/13/2016   iliofemoral endarterectomy with bovine pericardial patch angioplasty  . ENDARTERECTOMY FEMORAL Right 05/13/2016   Procedure: RIGHT ENDARTERECTOMY FEMORAL;  Surgeon: Serafina Mitchell, MD;  Location: [facility];  Service: Vascular;  Laterality: Right;  . PATCH ANGIOPLASTY Right 05/13/2016   Procedure: [place];  Surgeon: Serafina Mitchell, MD;  Location: [place];  Service: Vascular;  Laterality: Right;  . PERIPHERAL VASCULAR CATHETERIZATION N/A 02/20/2016   Procedure: Lower Extremity Angiography;  Surgeon: Lorretta Harp, MD;  Location: [facility];  Service: Cardiovascular;  Laterality: N/A;    Family History  Problem Relation Age of Onset  . Diabetes Sister   . Arthritis Mother   . Heart disease Father     Social History Social History   Tobacco Use  . Smoking status: Former Smoker    Packs/day: 0.25    Years: 65.00    Pack years: 16.25    Types: Cigarettes  . Smokeless tobacco: Never Used  . Tobacco comment: 05/13/2016 "stopped smoking ~ 1 month ago"  Substance Use Topics  . Alcohol use: No  . Drug use: No    Allergies  Allergen Reactions  . Codeine Swelling    Current Facility-Administered Medications  Medication Dose Route Frequency Provider Last Rate Last Dose  . acetaminophen (TYLENOL) tablet 650 mg  650 mg Oral Q4H PRN Phillips Grout, MD   650 mg at 03/10/18 2023  . aspirin EC tablet 325 mg  325 mg Oral Daily Phillips Grout, MD   325 mg at 03/14/18 0849  . atorvastatin (LIPITOR) tablet 20 mg  20 mg Oral q1800 Phillips Grout, MD   20 mg at 03/13/18 1713  . bisacodyl (DULCOLAX) suppository 10 mg  10 mg Rectal Once Domenic Polite, MD        . cholecalciferol (VITAMIN D) tablet 2,000 Units  2,000 Units Oral Daily Phillips Grout, MD   2,000 Units at 03/14/18 0848  . doxazosin (CARDURA) tablet 3 mg  3 mg Oral QHS Derrill Kay A, MD   3 mg at 03/13/18 2109  . fenofibrate tablet 160 mg  160 mg Oral Daily Derrill Kay A, MD   160 mg at 03/14/18 0848  . heparin injection 5,000 Units  5,000 Units Subcutaneous Q8H Monia Sabal, PA-C   5,000 Units at 03/14/18 1412  . HYDROcodone-acetaminophen (NORCO/VICODIN) 5-325 MG per tablet 1-2 tablet  1-2 tablet Oral Q6H PRN Domenic Polite, MD   2 tablet at 03/14/18 1411  . HYDROmorphone (DILAUDID) injection 1 mg  1 mg Intravenous Q3H PRN Domenic Polite, MD   1  mg at 03/14/18 1522  . insulin aspart (novoLOG) injection 0-9 Units  0-9 Units Subcutaneous Q4H Phillips Grout, MD   1 Units at 03/14/18 1155  . isosorbide mononitrate (IMDUR) 24 hr tablet 30 mg  30 mg Oral Daily Belva Crome, MD      . lactulose [facility]) 10 GM/15ML solution 20 g  20 g Oral TID Domenic Polite, MD      . losartan (COZAAR) tablet 25 mg  25 mg Oral Daily Nahser, Wonda Cheng, MD   25 mg at 03/14/18 0851  . magnesium hydroxide (MILK OF MAGNESIA) suspension 15 mL  15 mL Oral Daily PRN Domenic Polite, MD   15 mL at 03/11/18 2119  . metoprolol succinate (TOPROL-XL) 24 hr tablet 50 mg  50 mg Oral Daily Arnoldo Lenis, MD   50 mg at 03/14/18 0849  . nitroGLYCERIN (NITROSTAT) SL tablet 0.4 mg  0.4 mg Sublingual Q5 Min x 3 PRN Derrill Kay A, MD      . omega-3 acid ethyl esters (LOVAZA) capsule 2 g  2 g Oral Daily Derrill Kay A, MD   2 g at 03/14/18 0851  . ondansetron (ZOFRAN) injection 4 mg  4 mg Intravenous Q6H PRN Derrill Kay A, MD      . polyethylene glycol (MIRALAX / GLYCOLAX) packet 17 g  17 g Oral BID Domenic Polite, MD      . senna-docusate (Senokot-S) tablet 1 tablet  1 tablet Oral BID Domenic Polite, MD   1 tablet at 03/14/18 0848  . tamsulosin (FLOMAX) capsule 0.4 mg  0.4 mg Oral Daily Derrill Kay A, MD    0.4 mg at 03/14/18 1601    Review of Systems  Constitutional: positive for fatigue Eyes: negative Ears, nose, mouth, throat, and face: negative Respiratory: positive for dyspnea on exertion Cardiovascular: negative Gastrointestinal: negative Genitourinary:positive for Right flank pain Integument/breast: negative Hematologic/lymphatic: negative Musculoskeletal:negative Neurological: negative Behavioral/Psych: negative Endocrine: negative Allergic/Immunologic: negative  Physical Exam  UXN:ATFTD, healthy, no distress, well nourished and well developed SKIN: skin color, texture, turgor are normal, no rashes or significant lesions HEAD: Normocephalic, No masses, lesions, tenderness or abnormalities EYES: normal, PERRLA, Conjunctiva are pink and non-injected EARS: External ears normal, Canals clear OROPHARYNX:no exudate, no erythema and lips, buccal mucosa, and tongue normal  NECK: supple, no adenopathy, no JVD LYMPH:  no palpable lymphadenopathy, no hepatosplenomegaly LUNGS: expiratory wheezes bilaterally, scattered rhonchi bilaterally HEART: regular rate & rhythm, no murmurs and no gallops ABDOMEN:abdomen soft, non-tender, normal bowel sounds and no masses or organomegaly BACK: Back symmetric, no curvature., No CVA tenderness EXTREMITIES:no joint deformities, effusion, or inflammation, no edema  NEURO: alert & oriented x 3 with fluent speech, no focal motor/sensory deficits  PERFORMANCE STATUS: ECOG 1  LABORATORY DATA: Lab Results  Component Value Date   WBC 8.0 03/14/2018   HGB 10.4 (L) 03/14/2018   HCT 31.5 (L) 03/14/2018   MCV 90.5 03/14/2018   PLT 370 03/14/2018    @LASTCHEM @  RADIOGRAPHIC STUDIES: Dg Chest 2 View  Result Date: 03/08/2018 CLINICAL DATA:  Shortness of breath began after beginning pain medicine for kidney stones. The patient reports increase shortness of breath when walking. Former smoker. History of coronary artery disease. EXAM: CHEST - 2 VIEW  COMPARISON:  PA and lateral chest x-ray of February 25, 2018 FINDINGS: The left lung is well-expanded and clear. On the right there is persistent increased density in the inferior aspect of the upper lobe. There is new thickening of the minor fissure  and the previously demonstrated trace right pleural effusion has increased in volume. The cardiac silhouette is mildly enlarged. The pulmonary vascularity is normal. There is calcification in the wall of the thoracic aorta. The bony thorax exhibits no acute abnormality. IMPRESSION: Worsening of the small right pleural effusion. Persistent airspace opacity inferiorly in the right upper lobe slightly more conspicuous than on the February 25, 2018 study. This likely reflects pneumonia. A central obstructing lesion is not excluded however. Chest CT scanning is recommended to exclude occult malignancy. Thoracic aortic atherosclerosis. Electronically Signed   By: David  Martinique M.D.   On: 03/08/2018 13:09   Dg Chest 2 View  Result Date: 02/25/2018 CLINICAL DATA:  Shortness of breath and right flank pain for the past few weeks. EXAM: CHEST - 2 VIEW COMPARISON:  Chest x-ray dated February 13, 2016. FINDINGS: Stable mild cardiomegaly. Normal pulmonary vascularity. Atherosclerotic calcification of the aortic arch. The lungs remain hyperinflated with mildly increased retrosternal airspace. New streaky opacity in the right upper lobe and trace bilateral pleural effusions. No pneumothorax. No acute osseous abnormality. Old healed left-sided rib fractures. IMPRESSION: 1. New streaky opacity in the right upper lobe is favored to reflect atelectasis/scarring, although developing pneumonia could have a similar appearance. 2. New trace bilateral pleural effusions. 3. COPD. Electronically Signed   By: Titus Dubin M.D.   On: 02/25/2018 11:27   Ct Chest Wo Contrast  Result Date: 03/08/2018 CLINICAL DATA:  Shortness of breath for 3 weeks. CT recommended after abnormal chest x-ray. EXAM: CT  CHEST WITHOUT CONTRAST TECHNIQUE: Multidetector CT imaging of the chest was performed following the standard protocol without IV contrast. COMPARISON:  Chest x-ray on 03/08/2018 FINDINGS: Cardiovascular: The heart is enlarged. There is a 1.0 centimeter pericardial effusion. There is dense atherosclerotic calcification of the coronary vessels. The thoracic aorta is also partially calcified, with mildly aneurysmal ascending of the aorta measuring 4.0 centimeters. Mediastinum/Nodes: The thyroid is mildly heterogeneous. There is extensive mediastinal and hilar adenopathy. Enlarged nodes are identified in the LOWER neck bilaterally and in the supraclavicular regions, only partially evaluated. Numerous bulky lymph nodes are identified in the superior mediastinum and RIGHT paratracheal region, largest measuring 2.2 centimeters. Prevascular lymph node is 4.7 x 2.8 centimeters. Precarinal lymph node mass is 3.0 centimeters. Subcarinal mass is 2.3 centimeters. Enlarged RIGHT hilar lymph nodes are identified, associated with narrowing of the RIGHT central airways and soft tissue mass in the RIGHT hilum extends into the RIGHT UPPER lobe, described below. The esophagus is normal in appearance. Lungs/Pleura: Within the RIGHT UPPER lobe, there is nodular mass measuring approximately 5.0 x 2.8 centimeters. Mass is contiguous with RIGHT hilar adenopathy. There is soft tissue in the region of the RIGHT middle lobe, measuring 2.3 x 5.9 centimeters and also contiguous with the hilar adenopathy. There is thickening of the RIGHT minor and major fissures. RIGHT pleural effusion is present. LEFT lung is clear. Upper Abdomen: LEFT adrenal mass is 1.9 centimeters an indeterminate for benign lesion based on Hounsfield units. Numerous nodules are identified in the LEFT UPPER QUADRANT retroperitoneum, largest 1.8 centimeters. There is a mass extending off the UPPER pole of each kidney, not further characterized on this noncontrast exam. On the  RIGHT, mass is 1.8 centimeters. On the LEFT, masses 2.1 centimeters. Partially imaged retroperitoneal lymphadenopathy. Numerous layering gallstones are present. Musculoskeletal: There is a comminuted fracture of the RIGHT scapula. There are acute fracture fragments and probable callus, consistent with a subacute injury. In the mid body of the scapula, there  is question of a lytic lesion. IMPRESSION: 1. RIGHT UPPER lobe mass measuring 5.0 centimeters and suspicious for bronchogenic carcinoma. 2. Bulky adenopathy in the superior mediastinum, LOWER neck, supraclavicular region, mediastinum, and RIGHT hilar regions. 3. RIGHT pleural effusion and pleural thickening. 4. UPPER abdominal adenopathy/masses. Recommend further evaluation with CT of the abdomen and pelvis. Intravenous contrast is recommended unless contraindicated. Recommend renal protocol if possible. 5. Pericardial effusion. 6. LEFT adrenal mass 1.9 centimeters. 7. Suspect subacute fracture of the RIGHT scapula which may be a pathologic fracture. 8. Bilateral renal masses are indeterminate. 9. Cholelithiasis. 10.  Aortic Atherosclerosis (ICD10-I70.0). 11. Aortic aneurysm NOS (ICD10-I71.9). Ascending aorta is 4.0 centimeters. Recommend annual imaging followup by CTA or MRA. This recommendation follows 2010 ACCF/AHA/AATS/ACR/ASA/SCA/SCAI/SIR/STS/SVM Guidelines for the Diagnosis and Management of Patients with Thoracic Aortic Disease. Circulation. 2010; 121: [id number] Electronically Signed   By: Nolon Nations M.D.   On: 03/08/2018 19:55   Ct Renal Stone Study  Result Date: 02/25/2018 CLINICAL DATA:  77 year old male with a history shortness of breath and right flank pain. EXAM: CT ABDOMEN AND PELVIS WITHOUT CONTRAST TECHNIQUE: Multidetector CT imaging of the abdomen and pelvis was performed following the standard protocol without IV contrast. COMPARISON:  Ultrasound 04/23/2015 FINDINGS: Lower chest: Trace right-sided pleural effusion. No evidence of  pulmonary edema. Trace pericardial fluid/thickening. Native coronary calcifications. Hepatobiliary: Unremarkable appearance of liver. Calcified cholelithiasis. No associated inflammation. No biliary ductal dilation. Pancreas: Unremarkable appearance of the pancreas Spleen: Unremarkable appearance of the spleen. Adrenals/Urinary Tract: Unremarkable right adrenal gland. Left adrenal gland demonstrates a nodule at the superior aspect with intermediate Hounsfield units measuring 14 mm (image 16 series 3). Thickening of the lateral limb inferiorly with intermediate Hounsfield units. Right kidney without hydronephrosis. No nephrolithiasis. Distal right ureteral stone measures 8 mm at the ureterovesical junction. Trace right-sided perinephric stranding. Nodule on lateral cortex of the right kidney measures 11 mm with Hounsfield units measuring 17. Left kidney without hydronephrosis. Mild perinephric stranding. Nodule or lymph node at the hilum of the left kidney measures 16 mm. Unremarkable course of the left ureter. Calcifications of the left renal vasculature. Urinary bladder partially distended with prominent median lobe of the prostate. Stomach/Bowel: Small hiatal hernia. Unremarkable stomach. Unremarkable small bowel. No abnormal distention. Mild stool burden. Normal appendix. Colonic diverticular disease without evidence of acute diverticulitis. Vascular/Lymphatic: Vascular calcifications. No mesenteric or retroperitoneal adenopathy. No inguinal adenopathy. There are small soft tissue nodules/implants along the peritoneum of the abdomen, including right abdomen inferior to segment 6 of the liver, adjacent to the ascending colon, within the mesentery (image 59 of series 3), adjacent to the spleen, lateral to the left kidney. Reproductive: Enlarged prostate with prominent median lobe and transverse diameter measuring 5.1 cm. Other: None Musculoskeletal: Degenerative changes of the spine. No displaced fracture.  Degenerative changes of the hips. IMPRESSION: Right-sided ureterovesical junction stone measures 8 mm. No evidence of right-sided hydronephrosis. If there is concern for ascending urinary tract infection, recommend correlation with urinalysis. There are multiple small soft tissue lesions of the mesenteric/peritoneal surfaces, as above. While these may represent splenosis, the findings are nonspecific, and peritoneal carcinomatosis could have this appearance. Referral for oncologic evaluation is recommended. Bilateral indeterminate renal lesions, including left renal hilar lymph node/implant. Once workup has been initiated, MR imaging may be considered. Indeterminate left adrenal nodule. This may be evaluated further by MRI. Trace right-sided pleural effusion. Trace pericardial fluid/thickening. Native coronary artery disease and aortic atherosclerosis. Aortic Atherosclerosis (ICD10-I70.0). Cholelithiasis. Diverticular disease without evidence of acute  diverticulitis. Electronically Signed   By: Corrie Mckusick D.O.   On: 02/25/2018 13:57   US Biopsy (abdominal Retropertioneal Mass)  Result Date: 03/10/2018 INDICATION: 77 year old with evidence for metastatic disease and needs a tissue diagnosis. Patient is extensive cervical lymphadenopathy, chest lymphadenopathy, right lung lesion and concern for abdominal metastatic disease. EXAM: ULTRASOUND-GUIDED CORE BIOPSY OF RIGHT CERVICAL LYMPH NODES MEDICATIONS: None. ANESTHESIA/SEDATION: Moderate (conscious) sedation was employed during this procedure. A total of Versed 1 mg and Fentanyl 25 mcg was administered intravenously. Moderate Sedation Time: 13 minutes. The patient's level of consciousness and vital signs were monitored continuously by radiology nursing throughout the procedure under my direct supervision. FLUOROSCOPY TIME:  None COMPLICATIONS: None immediate. PROCEDURE: Informed written consent was obtained from the patient after a thorough discussion of the  procedural risks, benefits and alternatives. All questions were addressed. A timeout was performed prior to the initiation of the procedure. Both sides of the neck were evaluated with ultrasound. Patient has enlarged abnormal lymph nodes on both sides of the neck. Lymphadenopathy appears to be greater on the right side where the patient actually has a palpable lesion. A cluster of abnormal lymph nodes was targeted for biopsy. The right side of the neck was prepped with chlorhexidine and sterile field was created. Skin was anesthetized with 1% lidocaine. Using ultrasound guidance, 18 gauge core needle was directed into this cluster of abnormal lymph nodes. Total of 5 core biopsies were obtained. Specimens placed in saline. Bandage placed over the puncture site. FINDINGS: Cluster of abnormal lymph nodes in the right lower neck. In addition, there are abnormal large lymph nodes on the left side of the neck. Core biopsy needle confirmed within the abnormal lymph nodes. No evidence for hematoma formation or bleeding following the core biopsies. IMPRESSION: Successful ultrasound-guided core biopsies of abnormal right cervical lymph nodes. Electronically Signed   By: Markus Daft M.D.   On: 03/10/2018 16:07    ASSESSMENT: This is a very pleasant 77 years old white male recently diagnosed with extensive stage small cell lung cancer pending final pathology report, presented with large right upper lobe lung mass with extension to the right hilum in addition to the external and supraclavicular lymphadenopathy as well as metastatic disease to the retroperitoneal lymph nodes and left adrenal gland and suspicious right scapular bone lesion diagnosed in August 2019   PLAN: I had a lengthy discussion with the patient today about his condition and treatment options.  The patient has very complicated medical history and currently presenting with NSTEMI in addition to his recent diagnosis of the lung cancer. He is being managed  conservatively for the NSTEMI. I had a lengthy discussion with the patient about his treatment options.  I gave him the option of palliative care and hospice referral versus consideration of palliative systemic chemotherapy with carboplatin, etoposide and Tecentriq followed by maintenance Tecentriq if he has no evidence for disease progression. The patient is interested in treatment.  He understands his poor prognosis and the treatment is not curative in nature. He will need completion of the staging work-up by ordering a PET scan as well as MRI of the brain.  I will order this on outpatient basis after discharge. I also discussed with the patient the adverse effect of the chemotherapy including but not limited to alopecia, myelosuppression, nausea and vomiting, peripheral neuropathy, liver or renal dysfunction. I will arrange for the patient a follow-up appointment with me at the [facility] health cancer center after discharge for more detailed discussion  of his treatment options. The patient voices understanding of current disease status and treatment options and is in agreement with the current care plan.  All questions were answered. The patient knows to call the clinic with any problems, questions or concerns. We can certainly see the patient much sooner if necessary.  Thank you so much for allowing me to participate in the care of Arthur [name]. I will continue to follow up the patient with you and assist in his care.  Disclaimer: This note was dictated with voice recognition software. Similar sounding words can inadvertently be transcribed and may not be corrected upon review.   Eilleen Kempf March 14, 2018, 4:39 PM

## 2018-03-14 NOTE — Progress Notes (Addendum)
PROGRESS NOTE    Arthur [name]  HGD:[id number] DOB: 08-09-1940 DOA: 03/08/2018 PCP: Dione Housekeeper, MD  Brief Narrative: 77 year old male with history of CAD, diabetes mellitus type 2, PAD, long-term tobacco abuse and suspected COPD, CVA presented to the ED with [name]  with right flank pain, suspected to be related to his kidney stone, he sees Dr. Tresa Moore with urology and was supposed to have a urological procedure later this month. -in the ED at [facility] he was found to have a troponin over 6, NSTEMI, subsequently transferred to [facility] for left heart catheterization, this was deferred due to concomitant found finding of Large RUL Lung mass with metastatic lesions, s/p Supraclav LN biopsy -Cards following -also has a 8 mm right UVJ calculus, causing flank pain which is the main symptom bothering the patient, urology Dr. Tresa Moore has been following him,   Assessment & Plan:     NSTEMI (non-ST elevated myocardial infarction) ([place]) -asymptomatic, remote history of PCI to left circumflex in 1997 -admitted with significant troponin elevation and EKG without acute changes, peak troponin was 6, trended down to 4.4 , suspected ACS event was 2 weeks ago, he had Cardiopulm symptoms at home but didn't seek care -Continue aspirin, Lopressor, atorvastatin, off heparin drip now -2-D echocardiogram with drop in EF to 35-40% and inferior wall motion abnormalities,  -Cardiology following, ? Medical management    Right upper lobe lung mass -5 cm right upper lobe, w/ 65+ years of smoking highly suspicious for bronchogenic CA -Underwent US guided Biopsy of Large R Cervical Lymph node by IR 8/22 -Called Pathology, Biopsy c/w METASTATIC SMALL CELL CA -will d/w Oncology, Dr.Mohamed will consult, prognosis appears VERY POOR In light of Metastatic small cell CA with Peritoneal Carcinomatosis    Right flank pain -Has an 8 mm right UVJ calculus -Continue Flomax, continue Hydrocodone PRN -due to  persistence of symptoms, Dr.Manny trying to figure out if planning Cystoscopy would be reasonable under these circumstances    Diabetes mellitus -home medication lists high-dose lispro twice a day, very unusual regimen -hemoglobin A1c is 7.1, diabetes coordinator consult, continue sliding scale insulin for now -could transition to insulin 70/30 at discharge    Tobacco abuse -counseled    CKD 2  -stable   Constipation -due to narcotics, less mobility -continue miralax senokot, dulcolax and given enema 8/25 -increase miralax, repeat dulcolax and add lactulose  DVT prophylaxis: heparin SQ Code Status: full code Family Communication: no family at bedside Disposition Plan: pending plan from Cardiology  Consultants:  Cardiology IR D/w Oncology Dr.Shadad   Procedures:   Antimicrobials:    Subjective:  -continues to complain of constipation and right flank pain -Denies any chest pain or shortness of breath  Objective: Vitals:   03/14/18 0009 03/14/18 0430 03/14/18 0849 03/14/18 1259  BP: 134/76 126/71 116/68 120/69  Pulse: 82 91 91 85  Resp: 14 17  13   Temp: 98.2 F (36.8 C) 98 F (36.7 C)  98.2 F (36.8 C)  TempSrc: Oral Oral  Oral  SpO2: 96% 95%  95%  Weight:  75.6 kg    Height:        Intake/Output Summary (Last 24 hours) at 03/14/2018 1354 Last data filed at 03/14/2018 1258 Gross per 24 hour  Intake 240 ml  Output 600 ml  Net -360 ml   Filed Weights   03/08/18 1158 03/08/18 1845 03/14/18 0430  Weight: 80.7 kg 80.7 kg 75.6 kg    Examination:  Gen: Awake, Alert,  Oriented X 3, no distress HEENT: PERRLA, Neck supple, no JVD Lungs: poor air movement, clear CVS: S1S2/RRR Abd: soft, Non tender, non distended, BS present Extremities: 1plus edema Skin: no new rashes Psychiatry: Judgement and insight appear normal. Mood & affect appropriate.     Data Reviewed:   CBC: Recent Labs  Lab 03/08/18 1214 03/09/18 0713 03/10/18 0310 03/11/18 0317  03/12/18 0801 03/14/18 0404  WBC 6.3 6.6 6.0 5.9 7.4 8.0  NEUTROABS 4.4  --   --   --   --   --   HGB 10.6* 11.2* 10.8* 10.1* 10.7* 10.4*  HCT 31.3* 33.2* 31.7* 30.2* 33.0* 31.5*  MCV 91.0 89.0 90.3 90.7 91.2 90.5  PLT 296 354 364 350 421* 712   Basic Metabolic Panel: Recent Labs  Lab 03/09/18 0713 03/10/18 0310 03/11/18 0317 03/12/18 0801 03/14/18 0404  NA 134* 132* 130* 131* 126*  K 3.9 3.8 4.0 4.0 4.1  CL 97* 97* 98 96* 92*  CO2 25 26 25 26 25   GLUCOSE 112* 130* 127* 148* 126*  BUN 17 19 20 18 15   CREATININE 1.29* 1.25* 1.18 1.17 1.19  CALCIUM 9.4 9.1 9.0 9.2 9.3   GFR: Estimated Creatinine Clearance: 50.3 mL/min (by C-G formula based on SCr of 1.19 mg/dL). Liver Function Tests: Recent Labs  Lab 03/08/18 1214  AST 40  ALT 25  ALKPHOS 49  BILITOT 0.7  PROT 6.5  ALBUMIN 2.8*   Recent Labs  Lab 03/08/18 1214  LIPASE 24   No results for input(s): AMMONIA in the last 168 hours. Coagulation Profile: Recent Labs  Lab 03/08/18 1353 03/10/18 1024  INR 1.14 1.05   Cardiac Enzymes: Recent Labs  Lab 03/08/18 1214 03/08/18 1642 03/09/18 0713  TROPONINI 6.00* 5.77* 4.44*   BNP (last 3 results) No results for input(s): PROBNP in the last 8760 hours. HbA1C: No results for input(s): HGBA1C in the last 72 hours. CBG: Recent Labs  Lab 03/13/18 2035 03/14/18 0010 03/14/18 0428 03/14/18 0759 03/14/18 1105  GLUCAP 144* 132* 128* 121* 143*   Lipid Profile: No results for input(s): CHOL, HDL, LDLCALC, TRIG, CHOLHDL, LDLDIRECT in the last 72 hours. Thyroid Function Tests: No results for input(s): TSH, T4TOTAL, FREET4, T3FREE, THYROIDAB in the last 72 hours. Anemia Panel: No results for input(s): VITAMINB12, FOLATE, FERRITIN, TIBC, IRON, RETICCTPCT in the last 72 hours. Urine analysis:    Component Value Date/Time   COLORURINE YELLOW 02/25/2018 [place] 02/25/2018 1157   LABSPEC 1.016 02/25/2018 1157   PHURINE 6.0 02/25/2018 1157    GLUCOSEU 50 (A) 02/25/2018 1157   HGBUR NEGATIVE 02/25/2018 1157   BILIRUBINUR NEGATIVE 02/25/2018 1157   KETONESUR NEGATIVE 02/25/2018 1157   PROTEINUR NEGATIVE 02/25/2018 1157   NITRITE NEGATIVE 02/25/2018 1157   LEUKOCYTESUR NEGATIVE 02/25/2018 1157   Sepsis Labs: @LABRCNTIP (procalcitonin:4,lacticidven:4)  )No results found for this or any previous visit (from the past 240 hour(s)).       Radiology Studies: No results found.      Scheduled Meds: . aspirin EC  325 mg Oral Daily  . atorvastatin  20 mg Oral q1800  . bisacodyl  10 mg Rectal Once  . cholecalciferol  2,000 Units Oral Daily  . doxazosin  3 mg Oral QHS  . fenofibrate  160 mg Oral Daily  . heparin injection (subcutaneous)  5,000 Units Subcutaneous Q8H  . insulin aspart  0-9 Units Subcutaneous Q4H  . losartan  25 mg Oral Daily  . metoprolol succinate  50 mg  Oral Daily  . omega-3 acid ethyl esters  2 g Oral Daily  . polyethylene glycol  17 g Oral Daily  . senna-docusate  1 tablet Oral BID  . tamsulosin  0.4 mg Oral Daily   Continuous Infusions:    LOS: 6 days    Time spent: 75min    Domenic Polite, MD [facility] Page via [email or web address], password [email or web address] After 7PM please contact night-coverage  03/14/2018, 1:54 PM

## 2018-03-14 NOTE — Care Management Important Message (Signed)
Important Message  Patient Details  Name: Arthur [name] MRN: [id number] Date of Birth: February 26, 1941   Medicare Important Message Given:  Yes    Barb Merino [place] 03/14/2018, 12:35 PM

## 2018-03-15 ENCOUNTER — Encounter: Payer: Self-pay | Admitting: *Deleted

## 2018-03-15 LAB — GLUCOSE, CAPILLARY
GLUCOSE-CAPILLARY: 164 mg/dL — AB (ref 70–99)
GLUCOSE-CAPILLARY: 171 mg/dL — AB (ref 70–99)
GLUCOSE-CAPILLARY: 143 mg/dL — AB (ref 70–99)
GLUCOSE-CAPILLARY: 185 mg/dL — AB (ref 70–99)
GLUCOSE-CAPILLARY: 146 mg/dL — AB (ref 70–99)
Glucose-Capillary: 136 mg/dL — ABNORMAL HIGH (ref 70–99)
Glucose-Capillary: 148 mg/dL — ABNORMAL HIGH (ref 70–99)
Glucose-Capillary: 177 mg/dL — ABNORMAL HIGH (ref 70–99)
Glucose-Capillary: 88 mg/dL (ref 70–99)

## 2018-03-15 LAB — BASIC METABOLIC PANEL
Anion gap: 8 (ref 5–15)
BUN: 21 mg/dL (ref 8–23)
CHLORIDE: 92 mmol/L — AB (ref 98–111)
CO2: 28 mmol/L (ref 22–32)
Calcium: 9.8 mg/dL (ref 8.9–10.3)
Creatinine, Ser: 1.37 mg/dL — ABNORMAL HIGH (ref 0.61–1.24)
GFR calc Af Amer: 55 mL/min — ABNORMAL LOW (ref >60)
GFR calc non Af Amer: 47 mL/min — ABNORMAL LOW (ref >60)
GLUCOSE: 134 mg/dL — AB (ref 70–99)
POTASSIUM: 4.2 mmol/L (ref 3.5–5.1)
Sodium: 128 mmol/L — ABNORMAL LOW (ref 135–145)

## 2018-03-15 LAB — OSMOLALITY: Osmolality: 273 mOsm/kg — ABNORMAL LOW (ref 275–295)

## 2018-03-15 LAB — OSMOLALITY, URINE: Osmolality, Ur: 641 mOsm/kg (ref 300–900)

## 2018-03-15 LAB — SODIUM, URINE, RANDOM: Sodium, Ur: 44 mmol/L

## 2018-03-15 MED ORDER — CLOPIDOGREL BISULFATE 75 MG PO TABS
75.0000 mg | ORAL_TABLET | Freq: Every day | ORAL | Status: DC
  Administered 2018-03-16: 75 mg via ORAL
  Filled 2018-03-15: qty 1

## 2018-03-15 MED ORDER — ASPIRIN EC 81 MG PO TBEC
81.0000 mg | DELAYED_RELEASE_TABLET | Freq: Every day | ORAL | Status: DC
  Administered 2018-03-16: 81 mg via ORAL
  Filled 2018-03-15: qty 1

## 2018-03-15 NOTE — Progress Notes (Signed)
Oncology Nurse Navigator Documentation  Oncology Nurse Navigator Flowsheets 03/15/2018  Navigator Location [facility]-  Referral date to RadOnc/MedOnc 03/15/2018  Navigator Encounter Type Telephone/I received a call from patient. He is presently in the hospital. I gave him an appt to be seen with Dr. Julien Nordmann this Friday. He verbalized understanding of appt time and place.   Telephone Outgoing Call  Patient Visit Type Inpatient  Treatment Phase Pre-Tx/Tx Discussion  Barriers/Navigation Needs Education;Coordination of Care  Education Other  Interventions Coordination of Care;Education  Coordination of Care Appts  Education Method Verbal  Acuity Level 2  Time Spent with Patient 30

## 2018-03-15 NOTE — Progress Notes (Signed)
PROGRESS NOTE    Arthur [name]  LYY:[id number] DOB: 04-25-1941 DOA: 03/08/2018 PCP: Dione Housekeeper, MD  Brief Narrative: 77 year old male with history of CAD, diabetes mellitus type 2, PAD, long-term tobacco abuse and suspected COPD, CVA presented to the ED with [name]  with right flank pain, suspected to be related to his kidney stone, he sees Dr. Tresa Moore with urology and was supposed to have a urological procedure later this month. -in the ED at [facility] he was found to have a troponin over 6, NSTEMI, subsequently transferred to [facility] for left heart catheterization, this was deferred due to concomitant found finding of Large RUL Lung mass with metastatic lesions, s/p Supraclav LN biopsy -Cards following -also has a 8 mm right UVJ calculus, causing flank pain which is the main symptom bothering the patient, urology Dr. Tresa Moore has been following him,   Subjective:  -Reports good bowel movement this morning with good laxative regimen, still reports right flank pain, denies any chest pain or shortness of breath  Assessment & Plan:     NSTEMI (non-ST elevated myocardial infarction) ([place]) -asymptomatic, remote history of PCI to left circumflex in 1997 -admitted with significant troponin elevation and EKG without acute changes, peak troponin was 6, trended down to 4.4 , suspected ACS event was 2 weeks ago, he had Cardiopulm symptoms at home but didn't seek care -Management per cardiology, planned with conservative management, with no plan of intervention, continue with full medical therapy including aspirin, Lopressor, atorvastatin, initially on heparin drip, currently stopped. -No plan for intervention currently because if he ended needing stenting, he will require dual antiplatelet therapy for's 21-month, which might hinder some procedures or work-up for his lung malignancy and no stones. -2-D echocardiogram with drop in EF to 35-40% and inferior wall motion abnormalities,     Right  upper lobe lung mass -5 cm right upper lobe, w/ 65+ years of smoking highly suspicious for bronchogenic CA -Underwent US guided Biopsy of Large R Cervical Lymph node by IR 8/22 - Biopsy c/w METASTATIC SMALL CELL CA -Oncology input greatly appreciated, plan for outpatient PET and MRI brain, and palliative chemotherapy    Right flank pain -Has an 8 mm right UVJ calculus -Continue Flomax, continue Hydrocodone PRN -due to persistence of symptoms, Dr.Manny trying to figure out if planning Cystoscopy would be reasonable under these circumstances    Diabetes mellitus -home medication lists high-dose lispro twice a day, very unusual regimen -hemoglobin A1c is 7.1, diabetes coordinator consult, continue sliding scale insulin for now -could transition to insulin 70/30 at discharge    Tobacco abuse -counseled    CKD 2  -stable   Constipation -due to narcotics, less mobility -continue miralax senokot, dulcolax and given enema 8/25 -Good bowel movement today, continue with MiraLAX  Hyponatremia -Due to polydipsia, as he reports increased water intake, for now would restriction, check serum osmolality, urine osmolality and urine sodium, repeat BMP in a.m.  DVT prophylaxis: heparin SQ Code Status: full code Family Communication: no family at bedside Disposition Plan: pending plan from Cardiology  Consultants:  Cardiology IR D/w Oncology Dr.Shadad   Procedures:   Antimicrobials:      Objective: Vitals:   03/14/18 2010 03/15/18 0040 03/15/18 0844 03/15/18 1009  BP: 94/61 112/66 94/60 114/64  Pulse: 89 81  88  Resp: 12 16    Temp: 98 F (36.7 C) 98.4 F (36.9 C) (!) 97.3 F (36.3 C)   TempSrc: Oral Oral Oral   SpO2: 91% 95%  94%   Weight:      Height:        Intake/Output Summary (Last 24 hours) at 03/15/2018 1151 Last data filed at 03/14/2018 2123 Gross per 24 hour  Intake 222 ml  Output 100 ml  Net 122 ml   Filed Weights   03/08/18 1158 03/08/18 1845 03/14/18 0430   Weight: 80.7 kg 80.7 kg 75.6 kg    Examination:  Awake Alert, Oriented X 3, No new F.N deficits, Normal affect Symmetrical Chest wall movement, Good air movement bilaterally, CTAB RRR,No Gallops,Rubs or new Murmurs, No Parasternal Heave +ve B.Sounds, Abd Soft, No tenderness, No rebound - guarding or rigidity. No Cyanosis, Clubbing , has mild edema, No new Rash or bruise     Data Reviewed:   CBC: Recent Labs  Lab 03/08/18 1214 03/09/18 0713 03/10/18 0310 03/11/18 0317 03/12/18 0801 03/14/18 0404  WBC 6.3 6.6 6.0 5.9 7.4 8.0  NEUTROABS 4.4  --   --   --   --   --   HGB 10.6* 11.2* 10.8* 10.1* 10.7* 10.4*  HCT 31.3* 33.2* 31.7* 30.2* 33.0* 31.5*  MCV 91.0 89.0 90.3 90.7 91.2 90.5  PLT 296 354 364 350 421* 883   Basic Metabolic Panel: Recent Labs  Lab 03/10/18 0310 03/11/18 0317 03/12/18 0801 03/14/18 0404 03/15/18 0755  NA 132* 130* 131* 126* 128*  K 3.8 4.0 4.0 4.1 4.2  CL 97* 98 96* 92* 92*  CO2 26 25 26 25 28   GLUCOSE 130* 127* 148* 126* 134*  BUN 19 20 18 15 21   CREATININE 1.25* 1.18 1.17 1.19 1.37*  CALCIUM 9.1 9.0 9.2 9.3 9.8   GFR: Estimated Creatinine Clearance: 43.7 mL/min (A) (by C-G formula based on SCr of 1.37 mg/dL (H)). Liver Function Tests: Recent Labs  Lab 03/08/18 1214  AST 40  ALT 25  ALKPHOS 49  BILITOT 0.7  PROT 6.5  ALBUMIN 2.8*   Recent Labs  Lab 03/08/18 1214  LIPASE 24   No results for input(s): AMMONIA in the last 168 hours. Coagulation Profile: Recent Labs  Lab 03/08/18 1353 03/10/18 1024  INR 1.14 1.05   Cardiac Enzymes: Recent Labs  Lab 03/08/18 1214 03/08/18 1642 03/09/18 0713  TROPONINI 6.00* 5.77* 4.44*   BNP (last 3 results) No results for input(s): PROBNP in the last 8760 hours. HbA1C: No results for input(s): HGBA1C in the last 72 hours. CBG: Recent Labs  Lab 03/14/18 2012 03/15/18 0037 03/15/18 0407 03/15/18 0819 03/15/18 1137  GLUCAP 177* 136* 148* 88 171*   Lipid Profile: No results for  input(s): CHOL, HDL, LDLCALC, TRIG, CHOLHDL, LDLDIRECT in the last 72 hours. Thyroid Function Tests: No results for input(s): TSH, T4TOTAL, FREET4, T3FREE, THYROIDAB in the last 72 hours. Anemia Panel: No results for input(s): VITAMINB12, FOLATE, FERRITIN, TIBC, IRON, RETICCTPCT in the last 72 hours. Urine analysis:    Component Value Date/Time   COLORURINE YELLOW 02/25/2018 [place] 02/25/2018 1157   LABSPEC 1.016 02/25/2018 1157   PHURINE 6.0 02/25/2018 1157   GLUCOSEU 50 (A) 02/25/2018 1157   HGBUR NEGATIVE 02/25/2018 1157   BILIRUBINUR NEGATIVE 02/25/2018 1157   KETONESUR NEGATIVE 02/25/2018 1157   PROTEINUR NEGATIVE 02/25/2018 1157   NITRITE NEGATIVE 02/25/2018 1157   LEUKOCYTESUR NEGATIVE 02/25/2018 1157   Sepsis Labs: @LABRCNTIP (procalcitonin:4,lacticidven:4)  )No results found for this or any previous visit (from the past 240 hour(s)).       Radiology Studies: No results found.      Scheduled  Meds: . aspirin EC  325 mg Oral Daily  . atorvastatin  20 mg Oral q1800  . bisacodyl  10 mg Rectal Once  . cholecalciferol  2,000 Units Oral Daily  . doxazosin  3 mg Oral QHS  . fenofibrate  160 mg Oral Daily  . heparin injection (subcutaneous)  5,000 Units Subcutaneous Q8H  . insulin aspart  0-9 Units Subcutaneous Q4H  . isosorbide mononitrate  30 mg Oral Daily  . lactulose  20 g Oral TID  . losartan  25 mg Oral Daily  . metoprolol succinate  50 mg Oral Daily  . omega-3 acid ethyl esters  2 g Oral Daily  . polyethylene glycol  17 g Oral BID  . senna-docusate  1 tablet Oral BID  . tamsulosin  0.4 mg Oral Daily   Continuous Infusions:    LOS: 7 days      Phillips Climes, MD Pager [phone] [facility] Page via [email or web address], password [email or web address] After 7PM please contact night-coverage  03/15/2018, 11:51 AM

## 2018-03-15 NOTE — Progress Notes (Signed)
No anginal type symptoms. Plan for medical management for NSTEMI.   [facility] will sign off.   Medication Recommendations:  ASA 81mg  qd, Plavix 75mg  qd, Lipitor 20mg  qd, Imdur 30mg  qd, Losartan 25mg  qd and Toprol XL 50mg  qd. Give PRN nitro rx as well.  Other recommendations (labs, testing, etc):  None Follow up as an outpatient:  9/11 @ 8am with APP

## 2018-03-16 ENCOUNTER — Telehealth: Payer: Self-pay | Admitting: Internal Medicine

## 2018-03-16 LAB — CBC
HCT: 32.5 % — ABNORMAL LOW (ref 39.0–52.0)
HEMOGLOBIN: 10.9 g/dL — AB (ref 13.0–17.0)
MCH: 30.1 pg (ref 26.0–34.0)
MCHC: 33.5 g/dL (ref 30.0–36.0)
MCV: 89.8 fL (ref 78.0–100.0)
PLATELETS: 416 10*3/uL — AB (ref 150–400)
RBC: 3.62 MIL/uL — AB (ref 4.22–5.81)
RDW: 13 % (ref 11.5–15.5)
WBC: 8.1 10*3/uL (ref 4.0–10.5)

## 2018-03-16 LAB — BASIC METABOLIC PANEL
ANION GAP: 12 (ref 5–15)
BUN: 19 mg/dL (ref 8–23)
CHLORIDE: 93 mmol/L — AB (ref 98–111)
CO2: 22 mmol/L (ref 22–32)
Calcium: 9.7 mg/dL (ref 8.9–10.3)
Creatinine, Ser: 1.26 mg/dL — ABNORMAL HIGH (ref 0.61–1.24)
GFR, EST NON AFRICAN AMERICAN: 52 mL/min — AB (ref >60)
Glucose, Bld: 120 mg/dL — ABNORMAL HIGH (ref 70–99)
Potassium: 4.5 mmol/L (ref 3.5–5.1)
SODIUM: 127 mmol/L — AB (ref 135–145)

## 2018-03-16 LAB — GLUCOSE, CAPILLARY
GLUCOSE-CAPILLARY: 101 mg/dL — AB (ref 70–99)
GLUCOSE-CAPILLARY: 140 mg/dL — AB (ref 70–99)
Glucose-Capillary: 160 mg/dL — ABNORMAL HIGH (ref 70–99)

## 2018-03-16 MED ORDER — CLOPIDOGREL BISULFATE 75 MG PO TABS: 75.0000 mg | ORAL_TABLET | Freq: Every day | ORAL | 1 refills | Status: AC

## 2018-03-16 MED ORDER — PANTOPRAZOLE SODIUM 40 MG PO TBEC: 40.0000 mg | DELAYED_RELEASE_TABLET | Freq: Every day | ORAL | 1 refills | Status: AC

## 2018-03-16 MED ORDER — METOPROLOL SUCCINATE ER 50 MG PO TB24: 50.0000 mg | ORAL_TABLET | Freq: Every day | ORAL | 1 refills | Status: DC

## 2018-03-16 MED ORDER — HYDROMORPHONE HCL 2 MG PO TABS: 1.0000 mg | ORAL_TABLET | ORAL | 0 refills | Status: DC | PRN

## 2018-03-16 MED ORDER — INSULIN LISPRO 100 UNIT/ML ~~LOC~~ SOLN: 35.0000 [IU] | Freq: Two times a day (BID) | SUBCUTANEOUS | 11 refills | Status: AC

## 2018-03-16 MED ORDER — HYDROCODONE-ACETAMINOPHEN 10-325 MG PO TABS: 2.0000 | ORAL_TABLET | Freq: Four times a day (QID) | ORAL | 0 refills | Status: DC | PRN

## 2018-03-16 MED ORDER — NITROGLYCERIN 0.4 MG SL SUBL: 0.4000 mg | SUBLINGUAL_TABLET | SUBLINGUAL | 1 refills | Status: DC | PRN

## 2018-03-16 MED ORDER — ASPIRIN 81 MG PO TBEC: 81.0000 mg | DELAYED_RELEASE_TABLET | Freq: Every day | ORAL | 1 refills | Status: AC

## 2018-03-16 MED ORDER — ISOSORBIDE MONONITRATE ER 30 MG PO TB24: 30.0000 mg | ORAL_TABLET | Freq: Every day | ORAL | 1 refills | Status: DC

## 2018-03-16 MED ORDER — LOSARTAN POTASSIUM 25 MG PO TABS: 25.0000 mg | ORAL_TABLET | Freq: Every day | ORAL | 1 refills | Status: DC

## 2018-03-16 NOTE — Telephone Encounter (Signed)
Spoke with patient's daughter, Sharyn Lull, to confirm appt for 8/30 with MM.

## 2018-03-16 NOTE — Care Management Note (Signed)
Case Management Note Previous CM note completed by Carles Collet, RN 03/09/2018, 2:22 PM   Patient Details  Name: Arthur [name] MRN: [id number] Date of Birth: Jul 31, 1940  Subjective/Objective:           NSTEMI         Action/Plan:  Patient admitted from home for NSTEMI, found to have masses to abd/ lungs. Heart cath on hold, awaiting Dx for masses.  Patient independent prior to admission, lives alone, has wife who is a LTC resident at [facility]. He shares that he has been going there to feed her lunch everyday for the last 14 years.  He has two children that live in [place] and a local daughter who works two jobs and would not be able to assist a lot after DC.  Will continue to follow for Dx and treatment plan and assist with DC planning as his needs are determined.   Expected Discharge Date:  03/16/18               Expected Discharge Plan:  Home/Self Care  In-House Referral:  NA  Discharge planning Services  CM Consult  Post Acute Care Choice:  NA Choice offered to:  NA  DME Arranged:    DME Agency:     HH Arranged:    HH Agency:     Status of Service:  Completed, signed off  If discussed at [employer] of Stay Meetings, dates discussed:    Discharge Disposition: home/self care   Additional Comments:  03/16/18- 1143- Marvetta Gibbons RN, CM- pt with new dx of Metastatic small cell CA, Oncology to follow as outpt, Pt to transition back home, no f/u recommendations made per PT/OT evals.   Marvetta Gibbons [place], RN 03/16/2018, 11:41 AM [phone] 4E Transition Care Coordinator

## 2018-03-16 NOTE — Discharge Summary (Addendum)
Arthur [name], is a 77 y.o. male  DOB 07/05/1941  MRN [id number].  Admission date:  03/08/2018  Admitting Physician  Arthur Grout, MD  Discharge Date:  03/16/2018   Primary MD  Arthur Housekeeper, MD  Recommendations for primary care physician for things to follow:  -Check CBC, BMP during next visit, families instructed to schedule an appointment by Monday to repeat labs to ensure stable creatinine and sodium. -To follow with oncology Dr. Earlie [name] this Friday for new diagnosis of lung cancer -To follow with urology Dr. Bess [name] within 2 weeks regarding right renal stones -To follow with cardiology as an outpatient regarding new diagnosis of MI   Admission Diagnosis  Chronic kidney disease, unspecified CKD stage [N18.9]   Discharge Diagnosis  Chronic kidney disease, unspecified CKD stage [N18.9]    Principal Problem:   NSTEMI (non-ST elevated myocardial infarction) [facility]) Active Problems:   Coronary artery disease   Essential hypertension   Hyperlipidemia   Insulin dependent diabetes mellitus ([place])   PAD (peripheral artery disease) ([place])   Kidney stone   Mesenteric mass   Lung mass      Past Medical History:  Diagnosis Date  . Arthritis   . Chronic kidney disease   . Claudication ([place])   . Coronary artery disease 1997   CFX PCI, '97. Mod residual RI and RCA dis.  . Diabetes mellitus ([place])    IDDM  . Dyslipidemia   . Hard of hearing   . History of bronchitis   . History of kidney stones 1958  . HTN (hypertension)   . PAD (peripheral artery disease) ([place])   . Pneumonia   . Tobacco abuse   . Urinary incontinence   . Wears glasses     Past Surgical History:  Procedure Laterality Date  . APPENDECTOMY     "busted on me"  . COLONOSCOPY    . CORONARY ANGIOPLASTY  1997   CFX  . ENDARTERECTOMY FEMORAL Right 05/13/2016   iliofemoral endarterectomy with bovine pericardial patch angioplasty  .  ENDARTERECTOMY FEMORAL Right 05/13/2016   Procedure: RIGHT ENDARTERECTOMY FEMORAL;  Surgeon: Arthur Mitchell, MD;  Location: [place];  Service: Vascular;  Laterality: Right;  . PATCH ANGIOPLASTY Right 05/13/2016   Procedure: [place];  Surgeon: Arthur Mitchell, MD;  Location: [place];  Service: Vascular;  Laterality: Right;  . PERIPHERAL VASCULAR CATHETERIZATION N/A 02/20/2016   Procedure: Lower Extremity Angiography;  Surgeon: Arthur Harp, MD;  Location: [place] [facility];  Service: Cardiovascular;  Laterality: N/A;       History of present illness and  Hospital Course:     Kindly see H&P for history of present illness and admission details, please review complete Labs, Consult reports and Test reports for all details in brief  HPI  from the history and physical done on the day of admission 03/08/2018 HPI: Arthur [name] is a 77 y.o. male with medical history significant of chronic kidney disease, coronary artery disease, diabetes, hypertension, recent kidney stone  comes in with several weeks of dyspnea on exertion and shortness of breath.  He denies any cough.  He denies any lower extremity swelling or pain.  He denies any chest pain.  He denies any PND orthopnea.  He denies any fevers.  He denies any epigastric pain.  He has been trying to pass this kidney stone and is having some flank pain on the right.  Patient found to have a troponin of over 6 and NSTEMI referred for admission for such.  Cardiology seen the patient is recommending transfer to [facility] for heart catheterization in the morning.  Cardiology service at [facility] is been notified by Dr. Harl [name] here.    Hospital Course   76 year old male with history of CAD, diabetes mellitus type 2, PAD, long-term tobacco abuse and suspected COPD, CVA presented to the ED with [name]  with right flank pain, suspected to be related to his kidney stone, he sees Arthur [name] with urology and was supposed to  have a urological procedure later this month. -in the ED at [facility] he was found to have a troponin over 6, NSTEMI, subsequently transferred to [facility] for left heart catheterization, this was deferred due to concomitant found finding of Large RUL Lung mass with metastatic lesions, s/p Supraclav LN biopsy -Cards following -also has a 8 mm right UVJ calculus, causing flank pain which is the main symptom bothering the patient, urology Arthur [name] has been following him,   NSTEMI (non-ST elevated myocardial infarction) ([place]) -asymptomatic, remote history of PCI to left circumflex in 1997 -admitted with significant troponin elevation and EKG without acute changes, peak troponin was 6, trended down to 4.4 , suspected ACS event was 2 weeks ago, he had Cardiopulm symptoms at home but didn't seek care -Management per cardiology, planned with conservative management, with no plan of intervention, continue with full medical therapy including aspirin, plavix,Lopressor, atorvastatin, initially on heparin drip, currently stopped. -No plan for intervention currently because if he ended needing stenting, he will require dual antiplatelet therapy for's 79-month, which might hinder some procedures or work-up for his lung malignancy and kidney  Stones easily he is symptoms free as discussed with cardiology, plan is to continue now with full medical therapy, will add Plavix Nikkel optimization, but it can be stopped if any procedure are needed, I have discussed with renal, Plavix should not hinder their work-up currently. -2-D echocardiogram with drop in EF to 35-40% and inferior wall motion abnormalities,     Right upper lobe lung mass/lung cancer -5 cm right upper lobe, w/ 65+ years of smoking highly suspicious for bronchogenic CA -Underwent US guided Biopsy of Large R Cervical Lymph node by IR 8/22 - Biopsy c/w METASTATIC SMALL CELL CA -Oncology input greatly appreciated, plan for outpatient PET and  MRI brain, and palliative chemotherapy, has an appointment scheduled this day with Dr. Earlie [name]    Right flank pain/nephrolithiasis -Has an 8 mm right UVJ calculus -Continue Flomax, continue Hydrocodone PRN -due to persistence of symptoms, ArthurManny trying to figure out if planning Cystoscopy would be reasonable under these circumstances, we have discussed with him, plan to do procedure within 2 to 3 weeks as an outpatient setting as discussion with cardiology. -Initially was giving Vicodin for pain, reports not helped his pain, so he will be discharged on Dilaudid 1 mg every 4 hours as needed, total of 20 tablets last him 10 to 14 days until he seen by his urologist.    Diabetes mellitus -  home medication lists high-dose lispro twice a day, very unusual regimen -hemoglobin A1c is 7.1, diabetes coordinator consult, continue sliding scale insulin for now -Resume home dose lispro at 35 units twice daily    Tobacco abuse -counseled    CKD 2  -stable   Constipation -due to narcotics, less mobility -continue miralax senokot, dulcolax and given enema 8/25 -Good bowel movement today, continue with MiraLAX  Hyponatremia/SIADH -Serum osmolality of 273, urine osmolality of 641, urine sodium of 44, which is consistent of SIADH, sodium is 127 on discharge, discussed with patient and family fluid restriction 1500 cc/day, he is to follow with his PCP next week regarding repeat labs.  Discharge Condition:  Stable   Follow UP  Follow-up Information    Erlene Quan, PA-C. Go on 03/30/2018.   Specialties:  Cardiology, Radiology Why:  @8am  for hospital follow up with Dr. Kennon Holter PA Contact information: [place] [address] [phone]        Arthur Housekeeper, MD Follow up.   Specialty:  Family Medicine Contact information: [address] [place] [address] [phone]        Curt Bears, MD Follow up.   Specialty:  Oncology Why:   Please keep your appointment this Friday 10:30 AM Contact information: [place] [address] [phone]        Alexis Frock, MD Follow up.   Specialty:  Urology Why:  you will be  called by his office for an appointment for your procedure Contact information: [place] [address] [phone]             Discharge Instructions  and  Discharge Medications     Discharge Instructions    Discharge instructions   Complete by:  As directed    Follow with Primary MD Arthur Housekeeper, MD in 7 days   Get CBC, CMP, 2 view Chest X ray checked  by Primary MD next visit.    Activity: As tolerated with Full fall precautions use walker/cane & assistance as needed   Disposition Home    Diet: Heart Healthy carbohydrate modified, fluid restriction 1500 cc/day, with feeding assistance and aspiration precautions.  For Heart failure patients - Check your Weight same time everyday, if you gain over 2 pounds, or you develop in leg swelling, experience more shortness of breath or chest pain, call your Primary MD immediately. Follow Cardiac Low Salt Diet and 1.5 lit/day fluid restriction.   On your next visit with your primary care physician please Get Medicines reviewed and adjusted.   Please request your Prim.MD to go over all Hospital Tests and Procedure/Radiological results at the follow up, please get all Hospital records sent to your Prim MD by signing hospital release before you go home.   If you experience worsening of your admission symptoms, develop shortness of breath, life threatening emergency, suicidal or homicidal thoughts you must seek medical attention immediately by calling 911 or calling your MD immediately  if symptoms less severe.  You Must read complete instructions/literature along with all the possible adverse reactions/side effects for all the Medicines you take and that have been prescribed to you. Take any new  Medicines after you have completely understood and accpet all the possible adverse reactions/side effects.   Do not drive, operating heavy machinery, perform activities at heights, swimming or participation in water activities or provide baby sitting services if your were admitted for syncope or siezures until you have seen by Primary  MD or a Neurologist and advised to do so again.  Do not drive when taking Pain medications.    Do not take more than prescribed Pain, Sleep and Anxiety Medications  Special Instructions: If you have smoked or chewed Tobacco  in the last 2 yrs please stop smoking, stop any regular Alcohol  and or any Recreational drug use.  Wear Seat belts while driving.   Please note  You were cared for by a hospitalist during your hospital stay. If you have any questions about your discharge medications or the care you received while you were in the hospital after you are discharged, you can call the unit and asked to speak with the hospitalist on call if the hospitalist that took care of you is not available. Once you are discharged, your primary care physician will handle any further medical issues. Please note that NO REFILLS for any discharge medications will be authorized once you are discharged, as it is imperative that you return to your primary care physician (or establish a relationship with a primary care physician if you do not have one) for your aftercare needs so that they can reassess your need for medications and monitor your lab values.   Increase activity slowly   Complete by:  As directed      Allergies as of 03/16/2018      Reactions   Codeine Swelling      Medication List    STOP taking these medications   aspirin 81 MG tablet Replaced by:  aspirin 81 MG EC tablet   metoprolol tartrate 50 MG tablet Commonly known as:  LOPRESSOR     TAKE these medications   aspirin 81 MG EC tablet Take 1 tablet (81 mg total) by mouth daily. Start taking on:   03/17/2018 Replaces:  aspirin 81 MG tablet   atorvastatin 20 MG tablet Commonly known as:  LIPITOR TAKE 1 TABLET (20 MG TOTAL) BY MOUTH DAILY. What changed:  See the new instructions.   CENTRUM SILVER PO Take 1 tablet by mouth daily.   clopidogrel 75 MG tablet Commonly known as:  PLAVIX Take 1 tablet (75 mg total) by mouth daily. Start taking on:  03/17/2018   doxazosin 1 MG tablet Commonly known as:  CARDURA Take 3 mg by mouth at bedtime. Pt takes 3 at bedtime   fenofibrate 160 MG tablet TAKE 1 TABLET (160 MG TOTAL) BY MOUTH DAILY.   fish oil-omega-3 fatty acids 1000 MG capsule Take 2 g by mouth daily.   HYDROmorphone 2 MG tablet Commonly known as:  DILAUDID Take 0.5 tablets (1 mg total) by mouth every 4 (four) hours as needed for severe pain.   insulin lispro 100 UNIT/ML injection Commonly known as:  HUMALOG Inject 0.35 mLs (35 Units total) into the skin 2 (two) times daily. What changed:  how much to take   isosorbide mononitrate 30 MG 24 hr tablet Commonly known as:  IMDUR Take 1 tablet (30 mg total) by mouth daily. Start taking on:  03/17/2018   losartan 25 MG tablet Commonly known as:  COZAAR Take 1 tablet (25 mg total) by mouth daily. Start taking on:  03/17/2018   metoprolol succinate 50 MG 24 hr tablet Commonly known as:  TOPROL-XL Take 1 tablet (50 mg total) by mouth daily. Take with or immediately following a meal. Start taking on:  03/17/2018   nitroGLYCERIN 0.4 MG SL tablet Commonly known as:  NITROSTAT Place 1 tablet (0.4 mg total) under the tongue every  5 (five) minutes x 3 doses as needed for chest pain.   OSTEO BI-FLEX TRIPLE STRENGTH Tabs Take 1 tablet by mouth 2 (two) times daily.   oxybutynin 5 MG tablet Commonly known as:  DITROPAN Take 5 mg by mouth daily.   pantoprazole 40 MG tablet Commonly known as:  PROTONIX Take 1 tablet (40 mg total) by mouth daily.   Vitamin D 2000 units Caps Take 2,000 Units by mouth daily.         Diet  and Activity recommendation: See Discharge Instructions above   Consults obtained -   Cardiology Interventional radiology Urology Oncology  Major procedures and Radiology Reports - PLEASE review detailed and final reports for all details, in brief -   US guided core biopsies from a cluster of abnormal right cervical lymph nodes.    By interventional radiology 03/10/2018.  Echo  ------------------------------------------------------------------- LV EF: 35% -   40%  ------------------------------------------------------------------- Indications:      Pericardial effusion 423.9.  ------------------------------------------------------------------- History:   PMH:   Coronary artery disease.  Risk factors:  NSTEMI. Hypertension. Diabetes mellitus. Dyslipidemia.  ------------------------------------------------------------------- Study Conclusions  - Left ventricle: The cavity size was normal. Systolic function was   moderately reduced. The estimated ejection fraction was in the   range of 35% to 40%. Diffuse hypokinesis. There is akinesis of   the inferior and inferoseptal myocardium. Doppler parameters are   consistent with abnormal left ventricular relaxation (grade 1   diastolic dysfunction). - Aortic valve: Trileaflet; mildly thickened, mildly calcified   leaflets. - Pericardium, extracardiac: A small pericardial effusion was   identified circumferential to the heart. There was no evidence of   hemodynamic compromise.  Impressions:  - EF is decreased when compared to prior.  ------------------------------------------------------------------- Study data:  No prior study was available for comparison.  Study status:  Routine.  Procedure:  The patient reported no pain pre or post test. Transthoracic echocardiography. Image quality was fair. Study completion:  There were no complications. Transthoracic echocardiography.  M-mode, complete 2D, spectral Doppler, and  color Doppler.  Birthdate:  Patient birthdate: 04/20/1941.  Age:  Patient is 77 yr old.  Sex:  Gender: male. BMI: 26.3 kg/m^2.  Blood pressure:     140/84  Patient status: Inpatient.  Study date:  Study date: 03/09/2018. Study time: 01:41 PM.  Location:  Echo laboratory.  -------------------------------------------------------------------  ------------------------------------------------------------------- Left ventricle:  The cavity size was normal. Systolic function was moderately reduced. The estimated ejection fraction was in the range of 35% to 40%. Diffuse hypokinesis.  Regional wall motion abnormalities:   There is akinesis of the inferior and inferoseptal myocardium. Doppler parameters are consistent with abnormal left ventricular relaxation (grade 1 diastolic dysfunction).  ------------------------------------------------------------------- Aortic valve:   Trileaflet; mildly thickened, mildly calcified leaflets. Mobility was not restricted.  Doppler:  Transvalvular velocity was within the normal range. There was no stenosis. There was no regurgitation.  ------------------------------------------------------------------- Aorta:  Aortic root: The aortic root was normal in size.  ------------------------------------------------------------------- Mitral valve:   Structurally normal valve.   Mobility was not restricted.  Doppler:  Transvalvular velocity was within the normal range. There was no evidence for stenosis. There was no regurgitation.  ------------------------------------------------------------------- Left atrium:  The atrium was normal in size.  ------------------------------------------------------------------- Right ventricle:  The cavity size was normal. Wall thickness was normal. Systolic function was normal.  ------------------------------------------------------------------- Pulmonic valve:   Poorly visualized.  Structurally normal valve. Cusp  separation was normal.  Doppler:  Transvalvular velocity was within the normal  range. There was no evidence for stenosis. There was no regurgitation.  ------------------------------------------------------------------- Tricuspid valve:   Structurally normal valve.    Doppler: Transvalvular velocity was within the normal range. There was no regurgitation.  ------------------------------------------------------------------- Pulmonary artery:   The main pulmonary artery was normal-sized. Systolic pressure was within the normal range.  ------------------------------------------------------------------- Right atrium:  The atrium was normal in size.  ------------------------------------------------------------------- Pericardium:  A small pericardial effusion was identified circumferential to the heart. There was no evidence of hemodynamic compromise.    Dg Chest 2 View  Result Date: 03/08/2018 CLINICAL DATA:  Shortness of breath began after beginning pain medicine for kidney stones. The patient reports increase shortness of breath when walking. Former smoker. History of coronary artery disease. EXAM: CHEST - 2 VIEW COMPARISON:  PA and lateral chest x-ray of February 25, 2018 FINDINGS: The left lung is well-expanded and clear. On the right there is persistent increased density in the inferior aspect of the upper lobe. There is new thickening of the minor fissure and the previously demonstrated trace right pleural effusion has increased in volume. The cardiac silhouette is mildly enlarged. The pulmonary vascularity is normal. There is calcification in the wall of the thoracic aorta. The bony thorax exhibits no acute abnormality. IMPRESSION: Worsening of the small right pleural effusion. Persistent airspace opacity inferiorly in the right upper lobe slightly more conspicuous than on the February 25, 2018 study. This likely reflects pneumonia. A central obstructing lesion is not excluded however.  Chest CT scanning is recommended to exclude occult malignancy. Thoracic aortic atherosclerosis. Electronically Signed   By: David  Martinique M.D.   On: 03/08/2018 13:09   Dg Chest 2 View  Result Date: 02/25/2018 CLINICAL DATA:  Shortness of breath and right flank pain for the past few weeks. EXAM: CHEST - 2 VIEW COMPARISON:  Chest x-ray dated February 13, 2016. FINDINGS: Stable mild cardiomegaly. Normal pulmonary vascularity. Atherosclerotic calcification of the aortic arch. The lungs remain hyperinflated with mildly increased retrosternal airspace. New streaky opacity in the right upper lobe and trace bilateral pleural effusions. No pneumothorax. No acute osseous abnormality. Old healed left-sided rib fractures. IMPRESSION: 1. New streaky opacity in the right upper lobe is favored to reflect atelectasis/scarring, although developing pneumonia could have a similar appearance. 2. New trace bilateral pleural effusions. 3. COPD. Electronically Signed   By: Titus Dubin M.D.   On: 02/25/2018 11:27   Ct Chest Wo Contrast  Result Date: 03/08/2018 CLINICAL DATA:  Shortness of breath for 3 weeks. CT recommended after abnormal chest x-ray. EXAM: CT CHEST WITHOUT CONTRAST TECHNIQUE: Multidetector CT imaging of the chest was performed following the standard protocol without IV contrast. COMPARISON:  Chest x-ray on 03/08/2018 FINDINGS: Cardiovascular: The heart is enlarged. There is a 1.0 centimeter pericardial effusion. There is dense atherosclerotic calcification of the coronary vessels. The thoracic aorta is also partially calcified, with mildly aneurysmal ascending of the aorta measuring 4.0 centimeters. Mediastinum/Nodes: The thyroid is mildly heterogeneous. There is extensive mediastinal and hilar adenopathy. Enlarged nodes are identified in the LOWER neck bilaterally and in the supraclavicular regions, only partially evaluated. Numerous bulky lymph nodes are identified in the superior mediastinum and RIGHT  paratracheal region, largest measuring 2.2 centimeters. Prevascular lymph node is 4.7 x 2.8 centimeters. Precarinal lymph node mass is 3.0 centimeters. Subcarinal mass is 2.3 centimeters. Enlarged RIGHT hilar lymph nodes are identified, associated with narrowing of the RIGHT central airways and soft tissue mass in the RIGHT hilum extends into the RIGHT UPPER lobe, described  below. The esophagus is normal in appearance. Lungs/Pleura: Within the RIGHT UPPER lobe, there is nodular mass measuring approximately 5.0 x 2.8 centimeters. Mass is contiguous with RIGHT hilar adenopathy. There is soft tissue in the region of the RIGHT middle lobe, measuring 2.3 x 5.9 centimeters and also contiguous with the hilar adenopathy. There is thickening of the RIGHT minor and major fissures. RIGHT pleural effusion is present. LEFT lung is clear. Upper Abdomen: LEFT adrenal mass is 1.9 centimeters an indeterminate for benign lesion based on Hounsfield units. Numerous nodules are identified in the LEFT UPPER QUADRANT retroperitoneum, largest 1.8 centimeters. There is a mass extending off the UPPER pole of each kidney, not further characterized on this noncontrast exam. On the RIGHT, mass is 1.8 centimeters. On the LEFT, masses 2.1 centimeters. Partially imaged retroperitoneal lymphadenopathy. Numerous layering gallstones are present. Musculoskeletal: There is a comminuted fracture of the RIGHT scapula. There are acute fracture fragments and probable callus, consistent with a subacute injury. In the mid body of the scapula, there is question of a lytic lesion. IMPRESSION: 1. RIGHT UPPER lobe mass measuring 5.0 centimeters and suspicious for bronchogenic carcinoma. 2. Bulky adenopathy in the superior mediastinum, LOWER neck, supraclavicular region, mediastinum, and RIGHT hilar regions. 3. RIGHT pleural effusion and pleural thickening. 4. UPPER abdominal adenopathy/masses. Recommend further evaluation with CT of the abdomen and pelvis.  Intravenous contrast is recommended unless contraindicated. Recommend renal protocol if possible. 5. Pericardial effusion. 6. LEFT adrenal mass 1.9 centimeters. 7. Suspect subacute fracture of the RIGHT scapula which may be a pathologic fracture. 8. Bilateral renal masses are indeterminate. 9. Cholelithiasis. 10.  Aortic Atherosclerosis (ICD10-I70.0). 11. Aortic aneurysm NOS (ICD10-I71.9). Ascending aorta is 4.0 centimeters. Recommend annual imaging followup by CTA or MRA. This recommendation follows 2010 ACCF/AHA/AATS/ACR/ASA/SCA/SCAI/SIR/STS/SVM Guidelines for the Diagnosis and Management of Patients with Thoracic Aortic Disease. Circulation. 2010; 121: H607-P710 Electronically Signed   By: Nolon Nations M.D.   On: 03/08/2018 19:55   Ct Renal Stone Study  Result Date: 02/25/2018 CLINICAL DATA:  77 year old male with a history shortness of breath and right flank pain. EXAM: CT ABDOMEN AND PELVIS WITHOUT CONTRAST TECHNIQUE: Multidetector CT imaging of the abdomen and pelvis was performed following the standard protocol without IV contrast. COMPARISON:  Ultrasound 04/23/2015 FINDINGS: Lower chest: Trace right-sided pleural effusion. No evidence of pulmonary edema. Trace pericardial fluid/thickening. Native coronary calcifications. Hepatobiliary: Unremarkable appearance of liver. Calcified cholelithiasis. No associated inflammation. No biliary ductal dilation. Pancreas: Unremarkable appearance of the pancreas Spleen: Unremarkable appearance of the spleen. Adrenals/Urinary Tract: Unremarkable right adrenal gland. Left adrenal gland demonstrates a nodule at the superior aspect with intermediate Hounsfield units measuring 14 mm (image 16 series 3). Thickening of the lateral limb inferiorly with intermediate Hounsfield units. Right kidney without hydronephrosis. No nephrolithiasis. Distal right ureteral stone measures 8 mm at the ureterovesical junction. Trace right-sided perinephric stranding. Nodule on lateral  cortex of the right kidney measures 11 mm with Hounsfield units measuring 17. Left kidney without hydronephrosis. Mild perinephric stranding. Nodule or lymph node at the hilum of the left kidney measures 16 mm. Unremarkable course of the left ureter. Calcifications of the left renal vasculature. Urinary bladder partially distended with prominent median lobe of the prostate. Stomach/Bowel: Small hiatal hernia. Unremarkable stomach. Unremarkable small bowel. No abnormal distention. Mild stool burden. Normal appendix. Colonic diverticular disease without evidence of acute diverticulitis. Vascular/Lymphatic: Vascular calcifications. No mesenteric or retroperitoneal adenopathy. No inguinal adenopathy. There are small soft tissue nodules/implants along the peritoneum of the abdomen, including right  abdomen inferior to segment 6 of the liver, adjacent to the ascending colon, within the mesentery (image 59 of series 3), adjacent to the spleen, lateral to the left kidney. Reproductive: Enlarged prostate with prominent median lobe and transverse diameter measuring 5.1 cm. Other: None Musculoskeletal: Degenerative changes of the spine. No displaced fracture. Degenerative changes of the hips. IMPRESSION: Right-sided ureterovesical junction stone measures 8 mm. No evidence of right-sided hydronephrosis. If there is concern for ascending urinary tract infection, recommend correlation with urinalysis. There are multiple small soft tissue lesions of the mesenteric/peritoneal surfaces, as above. While these may represent splenosis, the findings are nonspecific, and peritoneal carcinomatosis could have this appearance. Referral for oncologic evaluation is recommended. Bilateral indeterminate renal lesions, including left renal hilar lymph node/implant. Once workup has been initiated, MR imaging may be considered. Indeterminate left adrenal nodule. This may be evaluated further by MRI. Trace right-sided pleural effusion. Trace  pericardial fluid/thickening. Native coronary artery disease and aortic atherosclerosis. Aortic Atherosclerosis (ICD10-I70.0). Cholelithiasis. Diverticular disease without evidence of acute diverticulitis. Electronically Signed   By: Corrie Mckusick D.O.   On: 02/25/2018 13:57   US Biopsy (abdominal Retropertioneal Mass)  Result Date: 03/10/2018 INDICATION: 77 year old with evidence for metastatic disease and needs a tissue diagnosis. Patient is extensive cervical lymphadenopathy, chest lymphadenopathy, right lung lesion and concern for abdominal metastatic disease. EXAM: ULTRASOUND-GUIDED CORE BIOPSY OF RIGHT CERVICAL LYMPH NODES MEDICATIONS: None. ANESTHESIA/SEDATION: Moderate (conscious) sedation was employed during this procedure. A total of Versed 1 mg and Fentanyl 25 mcg was administered intravenously. Moderate Sedation Time: 13 minutes. The patient's level of consciousness and vital signs were monitored continuously by radiology nursing throughout the procedure under my direct supervision. FLUOROSCOPY TIME:  None COMPLICATIONS: None immediate. PROCEDURE: Informed written consent was obtained from the patient after a thorough discussion of the procedural risks, benefits and alternatives. All questions were addressed. A timeout was performed prior to the initiation of the procedure. Both sides of the neck were evaluated with ultrasound. Patient has enlarged abnormal lymph nodes on both sides of the neck. Lymphadenopathy appears to be greater on the right side where the patient actually has a palpable lesion. A cluster of abnormal lymph nodes was targeted for biopsy. The right side of the neck was prepped with chlorhexidine and sterile field was created. Skin was anesthetized with 1% lidocaine. Using ultrasound guidance, 18 gauge core needle was directed into this cluster of abnormal lymph nodes. Total of 5 core biopsies were obtained. Specimens placed in saline. Bandage placed over the puncture site.  FINDINGS: Cluster of abnormal lymph nodes in the right lower neck. In addition, there are abnormal large lymph nodes on the left side of the neck. Core biopsy needle confirmed within the abnormal lymph nodes. No evidence for hematoma formation or bleeding following the core biopsies. IMPRESSION: Successful ultrasound-guided core biopsies of abnormal right cervical lymph nodes. Electronically Signed   By: Markus Daft M.D.   On: 03/10/2018 16:07    Micro Results     No results found for this or any previous visit (from the past 240 hour(s)).     Today   Subjective:   Everett Graff today has no headache,no chest pain, no shortness of breath, reports is feeling better, still complaining of his baseline right flank pain .  Objective:   Blood pressure (!) 151/65, pulse (!) 101, temperature 98 F (36.7 C), temperature source Oral, resp. rate 18, height 5\' 9"  (1.753 m), weight 75.6 kg, SpO2 95 %.   Intake/Output Summary (  Last 24 hours) at 03/16/2018 1231 Last data filed at 03/16/2018 0300 Gross per 24 hour  Intake -  Output 210 ml  Net -210 ml    Exam Awake Alert, Oriented x 3, No new F.N deficits, Normal affect Symmetrical Chest wall movement, Good air movement bilaterally, CTAB RRR,No Gallops,Rubs or new Murmurs, No Parasternal Heave +ve B.Sounds, Abd Soft, Non tender, no guarding or rigidity. No Cyanosis, Clubbing or edema, No new Rash or bruise  Data Review   CBC w Diff:  Lab Results  Component Value Date   WBC 8.1 03/16/2018   HGB 10.9 (L) 03/16/2018   HCT 32.5 (L) 03/16/2018   PLT 416 (H) 03/16/2018   LYMPHOPCT 15 03/08/2018   MONOPCT 14 03/08/2018   EOSPCT 0 03/08/2018   BASOPCT 1 03/08/2018    CMP:  Lab Results  Component Value Date   NA 127 (L) 03/16/2018   K 4.5 03/16/2018   CL 93 (L) 03/16/2018   CO2 22 03/16/2018   BUN 19 03/16/2018   CREATININE 1.26 (H) 03/16/2018   CREATININE 1.46 (H) 02/27/2016   PROT 6.5 03/08/2018   ALBUMIN 2.8 (L) 03/08/2018    BILITOT 0.7 03/08/2018   ALKPHOS 49 03/08/2018   AST 40 03/08/2018   ALT 25 03/08/2018  .   Total Time in preparing paper work, data evaluation and todays exam - 42 minutes  Arthur Climes M.D on 03/16/2018 at 12:31 PM  [facility]   Office  [phone]

## 2018-03-16 NOTE — Discharge Instructions (Signed)
Follow with Primary MD Arthur Housekeeper, MD in 7 days   Get CBC, CMP, 2 view Chest X ray checked  by Primary MD next visit.    Activity: As tolerated with Full fall precautions use walker/cane & assistance as needed   Disposition Home    Diet: Heart Healthy carbohydrate modified, fluid restriction 1500 cc/day, with feeding assistance and aspiration precautions.  For Heart failure patients - Check your Weight same time everyday, if you gain over 2 pounds, or you develop in leg swelling, experience more shortness of breath or chest pain, call your Primary MD immediately. Follow Cardiac Low Salt Diet and 1.5 lit/day fluid restriction.   On your next visit with your primary care physician please Get Medicines reviewed and adjusted.   Please request your Prim.MD to go over all Hospital Tests and Procedure/Radiological results at the follow up, please get all Hospital records sent to your Prim MD by signing hospital release before you go home.   If you experience worsening of your admission symptoms, develop shortness of breath, life threatening emergency, suicidal or homicidal thoughts you must seek medical attention immediately by calling 911 or calling your MD immediately  if symptoms less severe.  You Must read complete instructions/literature along with all the possible adverse reactions/side effects for all the Medicines you take and that have been prescribed to you. Take any new Medicines after you have completely understood and accpet all the possible adverse reactions/side effects.   Do not drive, operating heavy machinery, perform activities at heights, swimming or participation in water activities or provide baby sitting services if your were admitted for syncope or siezures until you have seen by Primary MD or a Neurologist and advised to do so again.  Do not drive when taking Pain medications.    Do not take more than prescribed Pain, Sleep and Anxiety Medications  Special  Instructions: If you have smoked or chewed Tobacco  in the last 2 yrs please stop smoking, stop any regular Alcohol  and or any Recreational drug use.  Wear Seat belts while driving.   Please note  You were cared for by a hospitalist during your hospital stay. If you have any questions about your discharge medications or the care you received while you were in the hospital after you are discharged, you can call the unit and asked to speak with the hospitalist on call if the hospitalist that took care of you is not available. Once you are discharged, your primary care physician will handle any further medical issues. Please note that NO REFILLS for any discharge medications will be authorized once you are discharged, as it is imperative that you return to your primary care physician (or establish a relationship with a primary care physician if you do not have one) for your aftercare needs so that they can reassess your need for medications and monitor your lab values.

## 2018-03-16 NOTE — Progress Notes (Signed)
Pt given AVS handout. Pt verbalized understanding. VSS. IV removed. Pt given all prescriptions.   Chauncey Reading

## 2018-03-17 MED ORDER — GENTAMICIN SULFATE 40 MG/ML IJ SOLN
5.0000 mg/kg | INTRAVENOUS | Status: DC
  Filled 2018-03-17: qty 9.5

## 2018-03-18 ENCOUNTER — Inpatient Hospital Stay: Payer: Medicare Other | Attending: Internal Medicine | Admitting: Internal Medicine

## 2018-03-18 ENCOUNTER — Encounter (HOSPITAL_COMMUNITY): Admission: RE | Payer: Self-pay | Source: Ambulatory Visit

## 2018-03-18 ENCOUNTER — Encounter: Payer: Self-pay | Admitting: Internal Medicine

## 2018-03-18 ENCOUNTER — Telehealth: Payer: Self-pay | Admitting: Internal Medicine

## 2018-03-18 ENCOUNTER — Ambulatory Visit (HOSPITAL_COMMUNITY): Admission: RE | Admit: 2018-03-18 | Payer: Medicare Other | Source: Ambulatory Visit | Admitting: Urology

## 2018-03-18 VITALS — BP 135/77 | HR 104 | Temp 97.7°F | Resp 18 | Ht 69.0 in | Wt 165.7 lb

## 2018-03-18 DIAGNOSIS — F17210 Nicotine dependence, cigarettes, uncomplicated: Secondary | ICD-10-CM | POA: Diagnosis not present

## 2018-03-18 DIAGNOSIS — R5382 Chronic fatigue, unspecified: Secondary | ICD-10-CM

## 2018-03-18 DIAGNOSIS — E785 Hyperlipidemia, unspecified: Secondary | ICD-10-CM

## 2018-03-18 DIAGNOSIS — C3491 Malignant neoplasm of unspecified part of right bronchus or lung: Secondary | ICD-10-CM | POA: Insufficient documentation

## 2018-03-18 DIAGNOSIS — C3411 Malignant neoplasm of upper lobe, right bronchus or lung: Secondary | ICD-10-CM | POA: Insufficient documentation

## 2018-03-18 DIAGNOSIS — M899 Disorder of bone, unspecified: Secondary | ICD-10-CM | POA: Insufficient documentation

## 2018-03-18 DIAGNOSIS — K8020 Calculus of gallbladder without cholecystitis without obstruction: Secondary | ICD-10-CM | POA: Diagnosis not present

## 2018-03-18 DIAGNOSIS — I129 Hypertensive chronic kidney disease with stage 1 through stage 4 chronic kidney disease, or unspecified chronic kidney disease: Secondary | ICD-10-CM | POA: Diagnosis not present

## 2018-03-18 DIAGNOSIS — C7972 Secondary malignant neoplasm of left adrenal gland: Secondary | ICD-10-CM | POA: Insufficient documentation

## 2018-03-18 DIAGNOSIS — E119 Type 2 diabetes mellitus without complications: Secondary | ICD-10-CM | POA: Insufficient documentation

## 2018-03-18 DIAGNOSIS — Z720 Tobacco use: Secondary | ICD-10-CM

## 2018-03-18 DIAGNOSIS — J90 Pleural effusion, not elsewhere classified: Secondary | ICD-10-CM | POA: Insufficient documentation

## 2018-03-18 DIAGNOSIS — Z5112 Encounter for antineoplastic immunotherapy: Secondary | ICD-10-CM

## 2018-03-18 DIAGNOSIS — I10 Essential (primary) hypertension: Secondary | ICD-10-CM

## 2018-03-18 DIAGNOSIS — Z79899 Other long term (current) drug therapy: Secondary | ICD-10-CM | POA: Diagnosis not present

## 2018-03-18 DIAGNOSIS — I2510 Atherosclerotic heart disease of native coronary artery without angina pectoris: Secondary | ICD-10-CM | POA: Diagnosis not present

## 2018-03-18 DIAGNOSIS — J449 Chronic obstructive pulmonary disease, unspecified: Secondary | ICD-10-CM | POA: Diagnosis not present

## 2018-03-18 DIAGNOSIS — I313 Pericardial effusion (noninflammatory): Secondary | ICD-10-CM | POA: Diagnosis not present

## 2018-03-18 DIAGNOSIS — Z7982 Long term (current) use of aspirin: Secondary | ICD-10-CM | POA: Diagnosis not present

## 2018-03-18 DIAGNOSIS — C778 Secondary and unspecified malignant neoplasm of lymph nodes of multiple regions: Secondary | ICD-10-CM | POA: Diagnosis not present

## 2018-03-18 DIAGNOSIS — N189 Chronic kidney disease, unspecified: Secondary | ICD-10-CM | POA: Insufficient documentation

## 2018-03-18 DIAGNOSIS — Z5111 Encounter for antineoplastic chemotherapy: Secondary | ICD-10-CM

## 2018-03-18 DIAGNOSIS — N200 Calculus of kidney: Secondary | ICD-10-CM

## 2018-03-18 DIAGNOSIS — Z7189 Other specified counseling: Secondary | ICD-10-CM

## 2018-03-18 DIAGNOSIS — I739 Peripheral vascular disease, unspecified: Secondary | ICD-10-CM | POA: Diagnosis not present

## 2018-03-18 SURGERY — CYSTOSCOPY/URETEROSCOPY/HOLMIUM LASER/STENT PLACEMENT
Anesthesia: General | Laterality: Right

## 2018-03-18 MED ORDER — HYDROMORPHONE HCL 2 MG PO TABS: 2.0000 mg | ORAL_TABLET | Freq: Four times a day (QID) | ORAL | 0 refills | Status: DC | PRN

## 2018-03-18 NOTE — Telephone Encounter (Signed)
Scheduled appt per 8/30 los - pt is aware of appts added and knows the appt for chemo edu is scheduled for 9/4

## 2018-03-18 NOTE — Progress Notes (Signed)
[facility] Telephone:([phone]) [phone]   [phone]  OFFICE PROGRESS NOTE  Dione Housekeeper, MD [place] [address]  DIAGNOSIS: extensive stage (T2b, N3, M1c) small cell lung cancer, presented with large right upper lobe lung mass with extension to the right hilum in addition to the external and supraclavicular lymphadenopathy as well as metastatic disease to the retroperitoneal lymph nodes and left adrenal gland and suspicious right scapular bone lesion diagnosed in August 2019  PRIOR THERAPY: None.  CURRENT THERAPY: Systemic chemotherapy with carboplatin for AUC of 5 on day 1, etoposide 100 mg/M2 on days 1, 2 and 3 as well as Tecentriq 1200 mg IV every 3 weeks with Neulasta support.  First dose 03/28/2018.  INTERVAL HISTORY: Arthur [name] 77 y.o. male returns to the clinic today for came to the clinic today for hospital follow-up visit accompanied by several family members including 2 granddaughters and son-in-law.  The patient was recently diagnosed with extensive stage small cell lung cancer after he presented to the hospital with significant shortness of breath.  CT scan of the chest at that time showed large right upper lobe lung mass with extension to the right hilum as well as mediastinal and supraclavicular lymphadenopathy and metastatic disease in the retroperitoneal lymph nodes as well as left adrenal gland and bone metastases in the right scapula.  He had ultrasound-guided core biopsy of the right supraclavicular lymph node and the final pathology was consistent with metastatic small cell lung cancer.  He is here today for evaluation and recommendation regarding treatment of his condition.  When seen today he continues to have pain in the right flank area from kidney stone that is followed by Dr. Tresa Moore.  He was giving prescription for Dilaudid 1 mg p.o. every 6 hours as needed for pain but this dose is not effective and the patient is running out of  his medications.  He denied having any fever or chills.  He has no nausea, vomiting, diarrhea or constipation.  He has no headache or visual changes.  MEDICAL HISTORY: Past Medical History:  Diagnosis Date  . Arthritis   . Chronic kidney disease   . Claudication ([employer])   . Coronary artery disease 1997   CFX PCI, '97. Mod residual RI and RCA dis.  . Diabetes mellitus ([place])    IDDM  . Dyslipidemia   . Hard of hearing   . History of bronchitis   . History of kidney stones 1958  . HTN (hypertension)   . PAD (peripheral artery disease) ([place])   . Pneumonia   . Tobacco abuse   . Urinary incontinence   . Wears glasses     ALLERGIES:  is allergic to codeine.  MEDICATIONS:  Current Outpatient Medications  Medication Sig Dispense Refill  . aspirin EC 81 MG EC tablet Take 1 tablet (81 mg total) by mouth daily. 30 tablet 1  . atorvastatin (LIPITOR) 20 MG tablet TAKE 1 TABLET (20 MG TOTAL) BY MOUTH DAILY. (Patient taking differently: Take 20 mg by mouth daily at 6 PM. ) 90 tablet 2  . Cholecalciferol (VITAMIN D) 2000 units CAPS Take 2,000 Units by mouth daily.     . clopidogrel (PLAVIX) 75 MG tablet Take 1 tablet (75 mg total) by mouth daily. 30 tablet 1  . doxazosin (CARDURA) 1 MG tablet Take 3 mg by mouth at bedtime. Pt takes 3 at bedtime     . fenofibrate 160 MG tablet TAKE 1 TABLET (160  MG TOTAL) BY MOUTH DAILY. 30 tablet 6  . fish oil-omega-3 fatty acids 1000 MG capsule Take 2 g by mouth daily.     Marland Kitchen HYDROmorphone (DILAUDID) 2 MG tablet Take 0.5 tablets (1 mg total) by mouth every 4 (four) hours as needed for severe pain. 20 tablet 0  . insulin lispro (HUMALOG) 100 UNIT/ML injection Inject 0.35 mLs (35 Units total) into the skin 2 (two) times daily. 10 mL 11  . isosorbide mononitrate (IMDUR) 30 MG 24 hr tablet Take 1 tablet (30 mg total) by mouth daily. 30 tablet 1  . losartan (COZAAR) 25 MG tablet Take 1 tablet (25 mg total) by mouth daily. 30 tablet 1  . metoprolol succinate  (TOPROL-XL) 50 MG 24 hr tablet Take 1 tablet (50 mg total) by mouth daily. Take with or immediately following a meal. 30 tablet 1  . Misc Natural Products (OSTEO BI-FLEX TRIPLE STRENGTH) TABS Take 1 tablet by mouth 2 (two) times daily.    . Multiple Vitamins-Minerals (CENTRUM SILVER PO) Take 1 tablet by mouth daily.    . nitroGLYCERIN (NITROSTAT) 0.4 MG SL tablet Place 1 tablet (0.4 mg total) under the tongue every 5 (five) minutes x 3 doses as needed for chest pain. 30 tablet 1  . oxybutynin (DITROPAN) 5 MG tablet Take 5 mg by mouth daily.     . pantoprazole (PROTONIX) 40 MG tablet Take 1 tablet (40 mg total) by mouth daily. 30 tablet 1   No current facility-administered medications for this visit.     SURGICAL HISTORY:  Past Surgical History:  Procedure Laterality Date  . APPENDECTOMY     "busted on me"  . COLONOSCOPY    . CORONARY ANGIOPLASTY  1997   CFX  . ENDARTERECTOMY FEMORAL Right 05/13/2016   iliofemoral endarterectomy with bovine pericardial patch angioplasty  . ENDARTERECTOMY FEMORAL Right 05/13/2016   Procedure: RIGHT ENDARTERECTOMY FEMORAL;  Surgeon: Serafina Mitchell, MD;  Location: [facility];  Service: Vascular;  Laterality: Right;  . PATCH ANGIOPLASTY Right 05/13/2016   Procedure: [place];  Surgeon: Serafina Mitchell, MD;  Location: [facility];  Service: Vascular;  Laterality: Right;  . PERIPHERAL VASCULAR CATHETERIZATION N/A 02/20/2016   Procedure: Lower Extremity Angiography;  Surgeon: Lorretta Harp, MD;  Location: [facility];  Service: Cardiovascular;  Laterality: N/A;    REVIEW OF SYSTEMS:  Constitutional: positive for anorexia, fatigue and weight loss Eyes: negative Ears, nose, mouth, throat, and face: negative Respiratory: positive for cough and dyspnea on exertion Cardiovascular: negative Gastrointestinal: negative Genitourinary:positive for Right flank pain from kidney stone Integument/breast:  negative Hematologic/lymphatic: negative Musculoskeletal:positive for muscle weakness Neurological: negative Behavioral/Psych: negative Endocrine: negative Allergic/Immunologic: negative   PHYSICAL EXAMINATION: General appearance: alert, cooperative, fatigued and no distress Head: Normocephalic, without obvious abnormality, atraumatic Neck: no adenopathy, no JVD, supple, symmetrical, trachea midline and thyroid not enlarged, symmetric, no tenderness/mass/nodules Lymph nodes: Cervical, supraclavicular, and axillary nodes normal. Resp: rales RUL and wheezes RUL Back: symmetric, no curvature. ROM normal. No CVA tenderness. Cardio: regular rate and rhythm, S1, S2 normal, no murmur, click, rub or gallop GI: soft, non-tender; bowel sounds normal; no masses,  no organomegaly Extremities: extremities normal, atraumatic, no cyanosis or edema Neurologic: Alert and oriented X 3, normal strength and tone. Normal symmetric reflexes. Normal coordination and gait  ECOG PERFORMANCE STATUS: 1 - Symptomatic but completely ambulatory  Blood pressure 135/77, pulse (!) 104, temperature 97.7 F (36.5 C), temperature source Oral, resp. rate 18, height  5\' 9"  (1.753 m), weight 165 lb 11.2 oz (75.2 kg), SpO2 97 %.  LABORATORY DATA: Lab Results  Component Value Date   WBC 8.1 03/16/2018   HGB 10.9 (L) 03/16/2018   HCT 32.5 (L) 03/16/2018   MCV 89.8 03/16/2018   PLT 416 (H) 03/16/2018      Chemistry      Component Value Date/Time   NA 127 (L) 03/16/2018 0337   K 4.5 03/16/2018 0337   CL 93 (L) 03/16/2018 0337   CO2 22 03/16/2018 0337   BUN 19 03/16/2018 0337   CREATININE 1.26 (H) 03/16/2018 0337   CREATININE 1.46 (H) 02/27/2016 0820      Component Value Date/Time   CALCIUM 9.7 03/16/2018 0337   ALKPHOS 49 03/08/2018 1214   AST 40 03/08/2018 1214   ALT 25 03/08/2018 1214   BILITOT 0.7 03/08/2018 1214       RADIOGRAPHIC STUDIES: Dg Chest 2 View  Result Date: 03/08/2018 CLINICAL DATA:   Shortness of breath began after beginning pain medicine for kidney stones. The patient reports increase shortness of breath when walking. Former smoker. History of coronary artery disease. EXAM: CHEST - 2 VIEW COMPARISON:  PA and lateral chest x-ray of February 25, 2018 FINDINGS: The left lung is well-expanded and clear. On the right there is persistent increased density in the inferior aspect of the upper lobe. There is new thickening of the minor fissure and the previously demonstrated trace right pleural effusion has increased in volume. The cardiac silhouette is mildly enlarged. The pulmonary vascularity is normal. There is calcification in the wall of the thoracic aorta. The bony thorax exhibits no acute abnormality. IMPRESSION: Worsening of the small right pleural effusion. Persistent airspace opacity inferiorly in the right upper lobe slightly more conspicuous than on the February 25, 2018 study. This likely reflects pneumonia. A central obstructing lesion is not excluded however. Chest CT scanning is recommended to exclude occult malignancy. Thoracic aortic atherosclerosis. Electronically Signed   By: David  Martinique M.D.   On: 03/08/2018 13:09   Dg Chest 2 View  Result Date: 02/25/2018 CLINICAL DATA:  Shortness of breath and right flank pain for the past few weeks. EXAM: CHEST - 2 VIEW COMPARISON:  Chest x-ray dated February 13, 2016. FINDINGS: Stable mild cardiomegaly. Normal pulmonary vascularity. Atherosclerotic calcification of the aortic arch. The lungs remain hyperinflated with mildly increased retrosternal airspace. New streaky opacity in the right upper lobe and trace bilateral pleural effusions. No pneumothorax. No acute osseous abnormality. Old healed left-sided rib fractures. IMPRESSION: 1. New streaky opacity in the right upper lobe is favored to reflect atelectasis/scarring, although developing pneumonia could have a similar appearance. 2. New trace bilateral pleural effusions. 3. COPD. Electronically  Signed   By: Titus Dubin M.D.   On: 02/25/2018 11:27   Ct Chest Wo Contrast  Result Date: 03/08/2018 CLINICAL DATA:  Shortness of breath for 3 weeks. CT recommended after abnormal chest x-ray. EXAM: CT CHEST WITHOUT CONTRAST TECHNIQUE: Multidetector CT imaging of the chest was performed following the standard protocol without IV contrast. COMPARISON:  Chest x-ray on 03/08/2018 FINDINGS: Cardiovascular: The heart is enlarged. There is a 1.0 centimeter pericardial effusion. There is dense atherosclerotic calcification of the coronary vessels. The thoracic aorta is also partially calcified, with mildly aneurysmal ascending of the aorta measuring 4.0 centimeters. Mediastinum/Nodes: The thyroid is mildly heterogeneous. There is extensive mediastinal and hilar adenopathy. Enlarged nodes are identified in the LOWER neck bilaterally and in the supraclavicular regions, only partially evaluated.  Numerous bulky lymph nodes are identified in the superior mediastinum and RIGHT paratracheal region, largest measuring 2.2 centimeters. Prevascular lymph node is 4.7 x 2.8 centimeters. Precarinal lymph node mass is 3.0 centimeters. Subcarinal mass is 2.3 centimeters. Enlarged RIGHT hilar lymph nodes are identified, associated with narrowing of the RIGHT central airways and soft tissue mass in the RIGHT hilum extends into the RIGHT UPPER lobe, described below. The esophagus is normal in appearance. Lungs/Pleura: Within the RIGHT UPPER lobe, there is nodular mass measuring approximately 5.0 x 2.8 centimeters. Mass is contiguous with RIGHT hilar adenopathy. There is soft tissue in the region of the RIGHT middle lobe, measuring 2.3 x 5.9 centimeters and also contiguous with the hilar adenopathy. There is thickening of the RIGHT minor and major fissures. RIGHT pleural effusion is present. LEFT lung is clear. Upper Abdomen: LEFT adrenal mass is 1.9 centimeters an indeterminate for benign lesion based on Hounsfield units. Numerous  nodules are identified in the LEFT UPPER QUADRANT retroperitoneum, largest 1.8 centimeters. There is a mass extending off the UPPER pole of each kidney, not further characterized on this noncontrast exam. On the RIGHT, mass is 1.8 centimeters. On the LEFT, masses 2.1 centimeters. Partially imaged retroperitoneal lymphadenopathy. Numerous layering gallstones are present. Musculoskeletal: There is a comminuted fracture of the RIGHT scapula. There are acute fracture fragments and probable callus, consistent with a subacute injury. In the mid body of the scapula, there is question of a lytic lesion. IMPRESSION: 1. RIGHT UPPER lobe mass measuring 5.0 centimeters and suspicious for bronchogenic carcinoma. 2. Bulky adenopathy in the superior mediastinum, LOWER neck, supraclavicular region, mediastinum, and RIGHT hilar regions. 3. RIGHT pleural effusion and pleural thickening. 4. UPPER abdominal adenopathy/masses. Recommend further evaluation with CT of the abdomen and pelvis. Intravenous contrast is recommended unless contraindicated. Recommend renal protocol if possible. 5. Pericardial effusion. 6. LEFT adrenal mass 1.9 centimeters. 7. Suspect subacute fracture of the RIGHT scapula which may be a pathologic fracture. 8. Bilateral renal masses are indeterminate. 9. Cholelithiasis. 10.  Aortic Atherosclerosis (ICD10-I70.0). 11. Aortic aneurysm NOS (ICD10-I71.9). Ascending aorta is 4.0 centimeters. Recommend annual imaging followup by CTA or MRA. This recommendation follows 2010 ACCF/AHA/AATS/ACR/ASA/SCA/SCAI/SIR/STS/SVM Guidelines for the Diagnosis and Management of Patients with Thoracic Aortic Disease. Circulation. 2010; 121: Z610-R604 Electronically Signed   By: Nolon Nations M.D.   On: 03/08/2018 19:55   Ct Renal Stone Study  Result Date: 02/25/2018 CLINICAL DATA:  76 year old male with a history shortness of breath and right flank pain. EXAM: CT ABDOMEN AND PELVIS WITHOUT CONTRAST TECHNIQUE: Multidetector CT  imaging of the abdomen and pelvis was performed following the standard protocol without IV contrast. COMPARISON:  Ultrasound 04/23/2015 FINDINGS: Lower chest: Trace right-sided pleural effusion. No evidence of pulmonary edema. Trace pericardial fluid/thickening. Native coronary calcifications. Hepatobiliary: Unremarkable appearance of liver. Calcified cholelithiasis. No associated inflammation. No biliary ductal dilation. Pancreas: Unremarkable appearance of the pancreas Spleen: Unremarkable appearance of the spleen. Adrenals/Urinary Tract: Unremarkable right adrenal gland. Left adrenal gland demonstrates a nodule at the superior aspect with intermediate Hounsfield units measuring 14 mm (image 16 series 3). Thickening of the lateral limb inferiorly with intermediate Hounsfield units. Right kidney without hydronephrosis. No nephrolithiasis. Distal right ureteral stone measures 8 mm at the ureterovesical junction. Trace right-sided perinephric stranding. Nodule on lateral cortex of the right kidney measures 11 mm with Hounsfield units measuring 17. Left kidney without hydronephrosis. Mild perinephric stranding. Nodule or lymph node at the hilum of the left kidney measures 16 mm. Unremarkable course of the left  ureter. Calcifications of the left renal vasculature. Urinary bladder partially distended with prominent median lobe of the prostate. Stomach/Bowel: Small hiatal hernia. Unremarkable stomach. Unremarkable small bowel. No abnormal distention. Mild stool burden. Normal appendix. Colonic diverticular disease without evidence of acute diverticulitis. Vascular/Lymphatic: Vascular calcifications. No mesenteric or retroperitoneal adenopathy. No inguinal adenopathy. There are small soft tissue nodules/implants along the peritoneum of the abdomen, including right abdomen inferior to segment 6 of the liver, adjacent to the ascending colon, within the mesentery (image 59 of series 3), adjacent to the spleen, lateral to the  left kidney. Reproductive: Enlarged prostate with prominent median lobe and transverse diameter measuring 5.1 cm. Other: None Musculoskeletal: Degenerative changes of the spine. No displaced fracture. Degenerative changes of the hips. IMPRESSION: Right-sided ureterovesical junction stone measures 8 mm. No evidence of right-sided hydronephrosis. If there is concern for ascending urinary tract infection, recommend correlation with urinalysis. There are multiple small soft tissue lesions of the mesenteric/peritoneal surfaces, as above. While these may represent splenosis, the findings are nonspecific, and peritoneal carcinomatosis could have this appearance. Referral for oncologic evaluation is recommended. Bilateral indeterminate renal lesions, including left renal hilar lymph node/implant. Once workup has been initiated, MR imaging may be considered. Indeterminate left adrenal nodule. This may be evaluated further by MRI. Trace right-sided pleural effusion. Trace pericardial fluid/thickening. Native coronary artery disease and aortic atherosclerosis. Aortic Atherosclerosis (ICD10-I70.0). Cholelithiasis. Diverticular disease without evidence of acute diverticulitis. Electronically Signed   By: Corrie Mckusick D.O.   On: 02/25/2018 13:57   US Biopsy (abdominal Retropertioneal Mass)  Result Date: 03/10/2018 INDICATION: 77 year old with evidence for metastatic disease and needs a tissue diagnosis. Patient is extensive cervical lymphadenopathy, chest lymphadenopathy, right lung lesion and concern for abdominal metastatic disease. EXAM: ULTRASOUND-GUIDED CORE BIOPSY OF RIGHT CERVICAL LYMPH NODES MEDICATIONS: None. ANESTHESIA/SEDATION: Moderate (conscious) sedation was employed during this procedure. A total of Versed 1 mg and Fentanyl 25 mcg was administered intravenously. Moderate Sedation Time: 13 minutes. The patient's level of consciousness and vital signs were monitored continuously by radiology nursing throughout  the procedure under my direct supervision. FLUOROSCOPY TIME:  None COMPLICATIONS: None immediate. PROCEDURE: Informed written consent was obtained from the patient after a thorough discussion of the procedural risks, benefits and alternatives. All questions were addressed. A timeout was performed prior to the initiation of the procedure. Both sides of the neck were evaluated with ultrasound. Patient has enlarged abnormal lymph nodes on both sides of the neck. Lymphadenopathy appears to be greater on the right side where the patient actually has a palpable lesion. A cluster of abnormal lymph nodes was targeted for biopsy. The right side of the neck was prepped with chlorhexidine and sterile field was created. Skin was anesthetized with 1% lidocaine. Using ultrasound guidance, 18 gauge core needle was directed into this cluster of abnormal lymph nodes. Total of 5 core biopsies were obtained. Specimens placed in saline. Bandage placed over the puncture site. FINDINGS: Cluster of abnormal lymph nodes in the right lower neck. In addition, there are abnormal large lymph nodes on the left side of the neck. Core biopsy needle confirmed within the abnormal lymph nodes. No evidence for hematoma formation or bleeding following the core biopsies. IMPRESSION: Successful ultrasound-guided core biopsies of abnormal right cervical lymph nodes. Electronically Signed   By: Markus Daft M.D.   On: 03/10/2018 16:07    ASSESSMENT AND PLAN: This is a very pleasant 77 years old white male recently diagnosed with extensive stage (T2b, N3, M1c) small cell lung  cancer presented with right upper lobe lung mass in addition to right mediastinal and supraclavicular lymphadenopathy as well as metastatic disease to the retroperitoneal lymph nodes, left adrenal gland as well as bone disease in the right scapula diagnosed in August 2019. I had a lengthy discussion with the patient and his family today about his current disease of stage, prognosis  and treatment options. I explained to the patient and his family that he has incurable condition and all the treatment are of palliative nature. I gave the patient the option of palliative care and hospice referral versus consideration of palliative systemic chemotherapy with carboplatin for AUC of 5 on day 1, etoposide 100 mg/M2 on days 1, 2 and 3 as well as Tecentriq (Atezolizumab) 1200 mg IV every 3 weeks with Neulasta support. The patient is interested in proceeding with systemic chemotherapy.  I discussed with him the adverse effect of this treatment including but not limited to alopecia, myelosuppression, nausea and vomiting, peripheral neuropathy, liver or renal dysfunction as well as the immunotherapy adverse effects. I will complete the staging work-up by ordering a PET scan as well as MRI of the brain to rule out any other metastatic disease. The patient is expected to start the first cycle of his treatment on 03/28/2018. I will arrange for the patient to have a chemotherapy education class before the first dose of his treatment. For the right flank pain from the kidney stone, I gave the patient refill of Dilaudid to be used for now until he sees his urologist for management of this condition. I will send prescription for Compazine 10 mg p.o. every 6 hours as needed for nausea to his pharmacy. The patient will come back for follow-up visit in 2 weeks for evaluation and management of any adverse effect of his treatment. He was advised to call immediately if he has any concerning symptoms in the interval. The patient voices understanding of current disease status and treatment options and is in agreement with the current care plan.  All questions were answered. The patient knows to call the clinic with any problems, questions or concerns. We can certainly see the patient much sooner if necessary.  I spent 30 minutes counseling the patient face to face. The total time spent in the appointment was  40 minutes.  Disclaimer: This note was dictated with voice recognition software. Similar sounding words can inadvertently be transcribed and may not be corrected upon review.

## 2018-03-18 NOTE — Progress Notes (Signed)
START ON PATHWAY REGIMEN - Small Cell Lung     Cycles 1 through 4, every 21 days:     Atezolizumab      Carboplatin      Etoposide    Cycles 5 and beyond, every 21 days:     Atezolizumab   **Always confirm dose/schedule in your pharmacy ordering system**  Patient Characteristics: Extensive Stage, First Line Stage Classification: Extensive AJCC T Category: T2b AJCC N Category: N3 AJCC M Category: M1c AJCC 8 Stage Grouping: IVB Line of therapy: First Line  Intent of Therapy: Non-Curative / Palliative Intent, Discussed with Patient

## 2018-03-20 DIAGNOSIS — Z5111 Encounter for antineoplastic chemotherapy: Secondary | ICD-10-CM | POA: Insufficient documentation

## 2018-03-20 DIAGNOSIS — Z5112 Encounter for antineoplastic immunotherapy: Secondary | ICD-10-CM | POA: Insufficient documentation

## 2018-03-20 DIAGNOSIS — Z7189 Other specified counseling: Secondary | ICD-10-CM | POA: Insufficient documentation

## 2018-03-23 ENCOUNTER — Inpatient Hospital Stay: Payer: Medicare Other

## 2018-03-24 ENCOUNTER — Telehealth: Payer: Self-pay | Admitting: Medical Oncology

## 2018-03-24 ENCOUNTER — Other Ambulatory Visit: Payer: Self-pay | Admitting: Urology

## 2018-03-24 NOTE — Telephone Encounter (Signed)
I told pt to contact Dr Tresa Moore re pain management. Pt said Dr Tresa Moore is working on it right now.

## 2018-03-24 NOTE — Telephone Encounter (Signed)
refill Dilaudid. Pt having a lot of pain in flank area and will run out of pain med in 1-2 days.. Urology issue with kidney stone was "put on back burner"( Dr Tresa Moore).

## 2018-03-24 NOTE — Telephone Encounter (Signed)
I did give him one-time refill until he discussed this issue with the urologist.  If he needs more pain medicine for the kidney stone, he may need to reach out to his urologist for refill.

## 2018-03-25 ENCOUNTER — Telehealth: Payer: Self-pay | Admitting: Internal Medicine

## 2018-03-25 NOTE — Telephone Encounter (Signed)
Called regarding 9/13

## 2018-03-28 ENCOUNTER — Inpatient Hospital Stay: Payer: Medicare Other | Attending: Internal Medicine

## 2018-03-28 ENCOUNTER — Encounter: Payer: Self-pay | Admitting: *Deleted

## 2018-03-28 ENCOUNTER — Inpatient Hospital Stay: Payer: Medicare Other

## 2018-03-28 ENCOUNTER — Other Ambulatory Visit: Payer: Self-pay | Admitting: *Deleted

## 2018-03-28 ENCOUNTER — Telehealth: Payer: Self-pay | Admitting: Cardiovascular Disease

## 2018-03-28 ENCOUNTER — Telehealth: Payer: Self-pay | Admitting: *Deleted

## 2018-03-28 VITALS — BP 126/64 | HR 88 | Temp 98.2°F | Resp 18

## 2018-03-28 DIAGNOSIS — C3491 Malignant neoplasm of unspecified part of right bronchus or lung: Secondary | ICD-10-CM

## 2018-03-28 DIAGNOSIS — C7972 Secondary malignant neoplasm of left adrenal gland: Secondary | ICD-10-CM | POA: Insufficient documentation

## 2018-03-28 DIAGNOSIS — C3411 Malignant neoplasm of upper lobe, right bronchus or lung: Secondary | ICD-10-CM | POA: Insufficient documentation

## 2018-03-28 DIAGNOSIS — I313 Pericardial effusion (noninflammatory): Secondary | ICD-10-CM | POA: Insufficient documentation

## 2018-03-28 DIAGNOSIS — N189 Chronic kidney disease, unspecified: Secondary | ICD-10-CM | POA: Diagnosis not present

## 2018-03-28 DIAGNOSIS — M129 Arthropathy, unspecified: Secondary | ICD-10-CM | POA: Diagnosis not present

## 2018-03-28 DIAGNOSIS — I700 Atherosclerosis of aorta: Secondary | ICD-10-CM | POA: Diagnosis not present

## 2018-03-28 DIAGNOSIS — I739 Peripheral vascular disease, unspecified: Secondary | ICD-10-CM | POA: Diagnosis not present

## 2018-03-28 DIAGNOSIS — I2510 Atherosclerotic heart disease of native coronary artery without angina pectoris: Secondary | ICD-10-CM | POA: Insufficient documentation

## 2018-03-28 DIAGNOSIS — E119 Type 2 diabetes mellitus without complications: Secondary | ICD-10-CM | POA: Insufficient documentation

## 2018-03-28 DIAGNOSIS — Z5111 Encounter for antineoplastic chemotherapy: Secondary | ICD-10-CM | POA: Insufficient documentation

## 2018-03-28 DIAGNOSIS — N200 Calculus of kidney: Secondary | ICD-10-CM | POA: Insufficient documentation

## 2018-03-28 DIAGNOSIS — I129 Hypertensive chronic kidney disease with stage 1 through stage 4 chronic kidney disease, or unspecified chronic kidney disease: Secondary | ICD-10-CM | POA: Insufficient documentation

## 2018-03-28 DIAGNOSIS — R531 Weakness: Secondary | ICD-10-CM | POA: Diagnosis not present

## 2018-03-28 DIAGNOSIS — Z87442 Personal history of urinary calculi: Secondary | ICD-10-CM | POA: Diagnosis not present

## 2018-03-28 DIAGNOSIS — C7951 Secondary malignant neoplasm of bone: Secondary | ICD-10-CM | POA: Insufficient documentation

## 2018-03-28 DIAGNOSIS — I714 Abdominal aortic aneurysm, without rupture: Secondary | ICD-10-CM | POA: Insufficient documentation

## 2018-03-28 DIAGNOSIS — R5382 Chronic fatigue, unspecified: Secondary | ICD-10-CM

## 2018-03-28 DIAGNOSIS — K219 Gastro-esophageal reflux disease without esophagitis: Secondary | ICD-10-CM | POA: Insufficient documentation

## 2018-03-28 DIAGNOSIS — J90 Pleural effusion, not elsewhere classified: Secondary | ICD-10-CM | POA: Insufficient documentation

## 2018-03-28 DIAGNOSIS — Z7982 Long term (current) use of aspirin: Secondary | ICD-10-CM | POA: Insufficient documentation

## 2018-03-28 DIAGNOSIS — E785 Hyperlipidemia, unspecified: Secondary | ICD-10-CM | POA: Diagnosis not present

## 2018-03-28 DIAGNOSIS — R5383 Other fatigue: Secondary | ICD-10-CM | POA: Diagnosis not present

## 2018-03-28 DIAGNOSIS — C778 Secondary and unspecified malignant neoplasm of lymph nodes of multiple regions: Secondary | ICD-10-CM | POA: Insufficient documentation

## 2018-03-28 DIAGNOSIS — Z5112 Encounter for antineoplastic immunotherapy: Secondary | ICD-10-CM | POA: Diagnosis not present

## 2018-03-28 DIAGNOSIS — R634 Abnormal weight loss: Secondary | ICD-10-CM | POA: Insufficient documentation

## 2018-03-28 DIAGNOSIS — I252 Old myocardial infarction: Secondary | ICD-10-CM | POA: Insufficient documentation

## 2018-03-28 DIAGNOSIS — Z79899 Other long term (current) drug therapy: Secondary | ICD-10-CM | POA: Insufficient documentation

## 2018-03-28 LAB — CMP (CANCER CENTER ONLY)
ALBUMIN: 3.2 g/dL — AB (ref 3.5–5.0)
ALK PHOS: 64 U/L (ref 38–126)
ALT: 13 U/L (ref 0–44)
ANION GAP: 11 (ref 5–15)
AST: 30 U/L (ref 15–41)
BILIRUBIN TOTAL: 0.6 mg/dL (ref 0.3–1.2)
BUN: 26 mg/dL — ABNORMAL HIGH (ref 8–23)
CALCIUM: 10 mg/dL (ref 8.9–10.3)
CO2: 26 mmol/L (ref 22–32)
Chloride: 99 mmol/L (ref 98–111)
Creatinine: 1.54 mg/dL — ABNORMAL HIGH (ref 0.61–1.24)
GFR, EST AFRICAN AMERICAN: 48 mL/min — AB (ref >60)
GFR, Estimated: 41 mL/min — ABNORMAL LOW (ref >60)
Glucose, Bld: 209 mg/dL — ABNORMAL HIGH (ref 70–99)
Potassium: 4.4 mmol/L (ref 3.5–5.1)
SODIUM: 136 mmol/L (ref 135–145)
TOTAL PROTEIN: 7 g/dL (ref 6.5–8.1)

## 2018-03-28 LAB — CBC WITH DIFFERENTIAL (CANCER CENTER ONLY)
BASOS ABS: 0.1 10*3/uL (ref 0.0–0.1)
BASOS PCT: 1 %
Eosinophils Absolute: 0 10*3/uL (ref 0.0–0.5)
Eosinophils Relative: 0 %
HEMATOCRIT: 30.6 % — AB (ref 38.4–49.9)
HEMOGLOBIN: 10.5 g/dL — AB (ref 13.0–17.1)
Lymphocytes Relative: 10 %
Lymphs Abs: 0.8 10*3/uL — ABNORMAL LOW (ref 0.9–3.3)
MCH: 30.3 pg (ref 27.2–33.4)
MCHC: 34.2 g/dL (ref 32.0–36.0)
MCV: 88.7 fL (ref 79.3–98.0)
Monocytes Absolute: 0.9 10*3/uL (ref 0.1–0.9)
Monocytes Relative: 10 %
NEUTROS ABS: 6.5 10*3/uL (ref 1.5–6.5)
NEUTROS PCT: 79 %
Platelet Count: 308 10*3/uL (ref 140–400)
RBC: 3.45 MIL/uL — ABNORMAL LOW (ref 4.20–5.82)
RDW: 13.9 % (ref 11.0–14.6)
WBC Count: 8.3 10*3/uL (ref 4.0–10.3)

## 2018-03-28 LAB — TSH: TSH: 1.032 u[IU]/mL (ref 0.320–4.118)

## 2018-03-28 MED ORDER — SODIUM CHLORIDE 0.9 % IV SOLN
Freq: Once | INTRAVENOUS | Status: AC
  Administered 2018-03-28: 12:00:00 via INTRAVENOUS
  Filled 2018-03-28: qty 5

## 2018-03-28 MED ORDER — PROCHLORPERAZINE MALEATE 10 MG PO TABS: 10.0000 mg | ORAL_TABLET | Freq: Four times a day (QID) | ORAL | 0 refills | Status: AC | PRN

## 2018-03-28 MED ORDER — SODIUM CHLORIDE 0.9 % IV SOLN
100.0000 mg/m2 | Freq: Once | INTRAVENOUS | Status: AC
  Administered 2018-03-28: 190 mg via INTRAVENOUS
  Filled 2018-03-28: qty 9.5

## 2018-03-28 MED ORDER — PALONOSETRON HCL INJECTION 0.25 MG/5ML
0.2500 mg | Freq: Once | INTRAVENOUS | Status: AC
  Administered 2018-03-28: 0.25 mg via INTRAVENOUS

## 2018-03-28 MED ORDER — SODIUM CHLORIDE 0.9 % IV SOLN
330.0000 mg | Freq: Once | INTRAVENOUS | Status: AC
  Administered 2018-03-28: 330 mg via INTRAVENOUS
  Filled 2018-03-28: qty 33

## 2018-03-28 MED ORDER — PALONOSETRON HCL INJECTION 0.25 MG/5ML
INTRAVENOUS | Status: AC
  Filled 2018-03-28: qty 5

## 2018-03-28 MED ORDER — SODIUM CHLORIDE 0.9 % IV SOLN
1200.0000 mg | Freq: Once | INTRAVENOUS | Status: AC
  Administered 2018-03-28: 1200 mg via INTRAVENOUS
  Filled 2018-03-28: qty 20

## 2018-03-28 MED ORDER — SODIUM CHLORIDE 0.9 % IV SOLN
Freq: Once | INTRAVENOUS | Status: AC
  Administered 2018-03-28: 12:00:00 via INTRAVENOUS
  Filled 2018-03-28: qty 250

## 2018-03-28 NOTE — Progress Notes (Signed)
Ok to treat with CMP per MD Julien Nordmann

## 2018-03-28 NOTE — Patient Instructions (Signed)
[facility] Discharge Instructions for Patients Receiving Chemotherapy  Today you received the following chemotherapy agents Tecentriq, Carboplatin and Etoposide  To help prevent nausea and vomiting after your treatment, we encourage you to take your nausea medication as directed.    If you develop nausea and vomiting that is not controlled by your nausea medication, call the clinic.   BELOW ARE SYMPTOMS THAT SHOULD BE REPORTED IMMEDIATELY:  *FEVER GREATER THAN 100.5 F  *CHILLS WITH OR WITHOUT FEVER  NAUSEA AND VOMITING THAT IS NOT CONTROLLED WITH YOUR NAUSEA MEDICATION  *UNUSUAL SHORTNESS OF BREATH  *UNUSUAL BRUISING OR BLEEDING  TENDERNESS IN MOUTH AND THROAT WITH OR WITHOUT PRESENCE OF ULCERS  *URINARY PROBLEMS  *BOWEL PROBLEMS  UNUSUAL RASH Items with * indicate a potential emergency and should be followed up as soon as possible.  Feel free to call the clinic should you have any questions or concerns. The clinic phone number is [phone].  Please show the [facility] at check-in to the Emergency Department and triage nurse.   Atezolizumab injection What is this medicine? ATEZOLIZUMAB (a te zoe LIZ ue mab) is a monoclonal antibody. It is used to treat bladder cancer (urothelial cancer) and non-small cell lung cancer. This medicine may be used for other purposes; ask your health care provider or pharmacist if you have questions. COMMON BRAND NAME(S): Tecentriq What should I tell my health care provider before I take this medicine? They need to know if you have any of these conditions: -diabetes -immune system problems -infection -inflammatory bowel disease -liver disease -lung or breathing disease -lupus -nervous system problems like myasthenia gravis or Guillain-Barre syndrome -organ transplant -an unusual or allergic reaction to atezolizumab, other medicines, foods, dyes, or preservatives -pregnant or trying to get  pregnant -breast-feeding How should I use this medicine? This medicine is for infusion into a vein. It is given by a health care professional in a hospital or clinic setting. A special MedGuide will be given to you before each treatment. Be sure to read this information carefully each time. Talk to your pediatrician regarding the use of this medicine in children. Special care may be needed. Overdosage: If you think you have taken too much of this medicine contact a poison control center or emergency room at once. NOTE: This medicine is only for you. Do not share this medicine with others. What if I miss a dose? It is important not to miss your dose. Call your doctor or health care professional if you are unable to keep an appointment. What may interact with this medicine? Interactions have not been studied. This list may not describe all possible interactions. Give your health care provider a list of all the medicines, herbs, non-prescription drugs, or dietary supplements you use. Also tell them if you smoke, drink alcohol, or use illegal drugs. Some items may interact with your medicine. What should I watch for while using this medicine? Your condition will be monitored carefully while you are receiving this medicine. You may need blood work done while you are taking this medicine. Do not become pregnant while taking this medicine or for at least 5 months after stopping it. Women should inform their doctor if they wish to become pregnant or think they might be pregnant. There is a potential for serious side effects to an unborn child. Talk to your health care professional or pharmacist for more information. Do not breast-feed an infant while taking this medicine or for at least 5 months after  the last dose. What side effects may I notice from receiving this medicine? Side effects that you should report to your doctor or health care professional as soon as possible: -allergic reactions like skin  rash, itching or hives, swelling of the face, lips, or tongue -black, tarry stools -bloody or watery diarrhea -breathing problems -changes in vision -chest pain or chest tightness -chills -facial flushing -fever -headache -signs and symptoms of high blood sugar such as dizziness; dry mouth; dry skin; fruity breath; nausea; stomach pain; increased hunger or thirst; increased urination -signs and symptoms of liver injury like dark yellow or brown urine; general ill feeling or flu-like symptoms; light-colored stools; loss of appetite; nausea; right upper belly pain; unusually weak or tired; yellowing of the eyes or skin -stomach pain -trouble passing urine or change in the amount of urine Side effects that usually do not require medical attention (report to your doctor or health care professional if they continue or are bothersome): -cough -diarrhea -joint pain -muscle pain -muscle weakness -tiredness -weight loss This list may not describe all possible side effects. Call your doctor for medical advice about side effects. You may report side effects to FDA at 1-800-FDA-1088. Where should I keep my medicine? This drug is given in a hospital or clinic and will not be stored at home. NOTE: This sheet is a summary. It may not cover all possible information. If you have questions about this medicine, talk to your doctor, pharmacist, or health care provider.  2018 Elsevier/Gold Standard (2015-08-07 17:54:14)  Carboplatin injection What is this medicine? CARBOPLATIN (KAR boe pla tin) is a chemotherapy drug. It targets fast dividing cells, like cancer cells, and causes these cells to die. This medicine is used to treat ovarian cancer and many other cancers. This medicine may be used for other purposes; ask your health care provider or pharmacist if you have questions. COMMON BRAND NAME(S): Paraplatin What should I tell my health care provider before I take this medicine? They need to know if  you have any of these conditions: -blood disorders -hearing problems -kidney disease -recent or ongoing radiation therapy -an unusual or allergic reaction to carboplatin, cisplatin, other chemotherapy, other medicines, foods, dyes, or preservatives -pregnant or trying to get pregnant -breast-feeding How should I use this medicine? This drug is usually given as an infusion into a vein. It is administered in a hospital or clinic by a specially trained health care professional. Talk to your pediatrician regarding the use of this medicine in children. Special care may be needed. Overdosage: If you think you have taken too much of this medicine contact a poison control center or emergency room at once. NOTE: This medicine is only for you. Do not share this medicine with others. What if I miss a dose? It is important not to miss a dose. Call your doctor or health care professional if you are unable to keep an appointment. What may interact with this medicine? -medicines for seizures -medicines to increase blood counts like filgrastim, pegfilgrastim, sargramostim -some antibiotics like amikacin, gentamicin, neomycin, streptomycin, tobramycin -vaccines Talk to your doctor or health care professional before taking any of these medicines: -acetaminophen -aspirin -ibuprofen -ketoprofen -naproxen This list may not describe all possible interactions. Give your health care provider a list of all the medicines, herbs, non-prescription drugs, or dietary supplements you use. Also tell them if you smoke, drink alcohol, or use illegal drugs. Some items may interact with your medicine. What should I watch for while using this medicine?  Your condition will be monitored carefully while you are receiving this medicine. You will need important blood work done while you are taking this medicine. This drug may make you feel generally unwell. This is not uncommon, as chemotherapy can affect healthy cells as well  as cancer cells. Report any side effects. Continue your course of treatment even though you feel ill unless your doctor tells you to stop. In some cases, you may be given additional medicines to help with side effects. Follow all directions for their use. Call your doctor or health care professional for advice if you get a fever, chills or sore throat, or other symptoms of a cold or flu. Do not treat yourself. This drug decreases your body's ability to fight infections. Try to avoid being around people who are sick. This medicine may increase your risk to bruise or bleed. Call your doctor or health care professional if you notice any unusual bleeding. Be careful brushing and flossing your teeth or using a toothpick because you may get an infection or bleed more easily. If you have any dental work done, tell your dentist you are receiving this medicine. Avoid taking products that contain aspirin, acetaminophen, ibuprofen, naproxen, or ketoprofen unless instructed by your doctor. These medicines may hide a fever. Do not become pregnant while taking this medicine. Women should inform their doctor if they wish to become pregnant or think they might be pregnant. There is a potential for serious side effects to an unborn child. Talk to your health care professional or pharmacist for more information. Do not breast-feed an infant while taking this medicine. What side effects may I notice from receiving this medicine? Side effects that you should report to your doctor or health care professional as soon as possible: -allergic reactions like skin rash, itching or hives, swelling of the face, lips, or tongue -signs of infection - fever or chills, cough, sore throat, pain or difficulty passing urine -signs of decreased platelets or bleeding - bruising, pinpoint red spots on the skin, black, tarry stools, nosebleeds -signs of decreased red blood cells - unusually weak or tired, fainting spells,  lightheadedness -breathing problems -changes in hearing -changes in vision -chest pain -high blood pressure -low blood counts - This drug may decrease the number of white blood cells, red blood cells and platelets. You may be at increased risk for infections and bleeding. -nausea and vomiting -pain, swelling, redness or irritation at the injection site -pain, tingling, numbness in the hands or feet -problems with balance, talking, walking -trouble passing urine or change in the amount of urine Side effects that usually do not require medical attention (report to your doctor or health care professional if they continue or are bothersome): -hair loss -loss of appetite -metallic taste in the mouth or changes in taste This list may not describe all possible side effects. Call your doctor for medical advice about side effects. You may report side effects to FDA at 1-800-FDA-1088. Where should I keep my medicine? This drug is given in a hospital or clinic and will not be stored at home. NOTE: This sheet is a summary. It may not cover all possible information. If you have questions about this medicine, talk to your doctor, pharmacist, or health care provider.  2018 Elsevier/Gold Standard (2007-10-11 14:38:05)  Etoposide, VP-16 injection What is this medicine? ETOPOSIDE, VP-16 (e toe POE side) is a chemotherapy drug. It is used to treat testicular cancer, lung cancer, and other cancers. This medicine may be used for  other purposes; ask your health care provider or pharmacist if you have questions. COMMON BRAND NAME(S): Etopophos, Toposar, VePesid What should I tell my health care provider before I take this medicine? They need to know if you have any of these conditions: -infection -kidney disease -liver disease -low blood counts, like low white cell, platelet, or red cell counts -an unusual or allergic reaction to etoposide, other medicines, foods, dyes, or preservatives -pregnant or  trying to get pregnant -breast-feeding How should I use this medicine? This medicine is for infusion into a vein. It is administered in a hospital or clinic by a specially trained health care professional. Talk to your pediatrician regarding the use of this medicine in children. Special care may be needed. Overdosage: If you think you have taken too much of this medicine contact a poison control center or emergency room at once. NOTE: This medicine is only for you. Do not share this medicine with others. What if I miss a dose? It is important not to miss your dose. Call your doctor or health care professional if you are unable to keep an appointment. What may interact with this medicine? -aspirin -certain medications for seizures like carbamazepine, phenobarbital, phenytoin, valproic acid -cyclosporine -levamisole -warfarin This list may not describe all possible interactions. Give your health care provider a list of all the medicines, herbs, non-prescription drugs, or dietary supplements you use. Also tell them if you smoke, drink alcohol, or use illegal drugs. Some items may interact with your medicine. What should I watch for while using this medicine? Visit your doctor for checks on your progress. This drug may make you feel generally unwell. This is not uncommon, as chemotherapy can affect healthy cells as well as cancer cells. Report any side effects. Continue your course of treatment even though you feel ill unless your doctor tells you to stop. In some cases, you may be given additional medicines to help with side effects. Follow all directions for their use. Call your doctor or health care professional for advice if you get a fever, chills or sore throat, or other symptoms of a cold or flu. Do not treat yourself. This drug decreases your body's ability to fight infections. Try to avoid being around people who are sick. This medicine may increase your risk to bruise or bleed. Call your  doctor or health care professional if you notice any unusual bleeding. Talk to your doctor about your risk of cancer. You may be more at risk for certain types of cancers if you take this medicine. Do not become pregnant while taking this medicine or for at least 6 months after stopping it. Women should inform their doctor if they wish to become pregnant or think they might be pregnant. Women of child-bearing potential will need to have a negative pregnancy test before starting this medicine. There is a potential for serious side effects to an unborn child. Talk to your health care professional or pharmacist for more information. Do not breast-feed an infant while taking this medicine. Men must use a latex condom during sexual contact with a woman while taking this medicine and for at least 4 months after stopping it. A latex condom is needed even if you have had a vasectomy. Contact your doctor right away if your partner becomes pregnant. Do not donate sperm while taking this medicine and for at least 4 months after you stop taking this medicine. Men should inform their doctors if they wish to father a child. This medicine may  lower sperm counts. What side effects may I notice from receiving this medicine? Side effects that you should report to your doctor or health care professional as soon as possible: -allergic reactions like skin rash, itching or hives, swelling of the face, lips, or tongue -low blood counts - this medicine may decrease the number of white blood cells, red blood cells and platelets. You may be at increased risk for infections and bleeding. -signs of infection - fever or chills, cough, sore throat, pain or difficulty passing urine -signs of decreased platelets or bleeding - bruising, pinpoint red spots on the skin, black, tarry stools, blood in the urine -signs of decreased red blood cells - unusually weak or tired, fainting spells, lightheadedness -breathing problems -changes in  vision -mouth or throat sores or ulcers -pain, redness, swelling or irritation at the injection site -pain, tingling, numbness in the hands or feet -redness, blistering, peeling or loosening of the skin, including inside the mouth -seizures -vomiting Side effects that usually do not require medical attention (report to your doctor or health care professional if they continue or are bothersome): -diarrhea -hair loss -loss of appetite -nausea -stomach pain This list may not describe all possible side effects. Call your doctor for medical advice about side effects. You may report side effects to FDA at 1-800-FDA-1088. Where should I keep my medicine? This drug is given in a hospital or clinic and will not be stored at home. NOTE: This sheet is a summary. It may not cover all possible information. If you have questions about this medicine, talk to your doctor, pharmacist, or health care provider.  2018 Elsevier/Gold Standard (2015-06-28 11:53:23)

## 2018-03-28 NOTE — Telephone Encounter (Signed)
Returned call, s/w Ronalee Belts regarding pt appts today. Discussed possible transfer of care to [facility] as pt is driving quite a distance to come for treatment.

## 2018-03-28 NOTE — Telephone Encounter (Signed)
   [facility] Pre-operative Risk Assessment    Request for surgical clearance:  1. What type of surgery is being performed? Cysto, Right Retrograde Pyelogram, Ureteroscopy, Laser Lithotripsy and stent placement   2. When is this surgery scheduled? 04/13/18   3. What type of clearance is required (medical clearance vs. Pharmacy clearance to hold med vs. Both)? Both  4. Are there any medications that need to be held prior to surgery and how long? Pt on Plavix and ASA 81--Per Dr. Tresa Moore, ok to remain on ASA; need clearance to HOLD Plavix for 5 days  5. Practice name and name of physician performing surgery? [facility]  Dr. Alexis Frock   6. What is your office phone number [phone]    7.   What is your office fax number [phone]  8.   Anesthesia type (None, local, MAC, general) ? Not specified   Therisa Doyne 03/28/2018, 3:32 PM  _________________________________________________________________   (provider comments below)

## 2018-03-28 NOTE — Progress Notes (Signed)
Oncology Nurse Navigator Documentation  Oncology Nurse Navigator Flowsheets 03/28/2018  Navigator Location [facility]-  Navigator Encounter Type Clinic/MDC/spoke with patient and family today during chemo ed class. I help to explain treatment plan and up coming appts.  Barriers identified: Education-address with questions and concerns answered.  Transportation-patient son states he lives 100 miles round trip to cancer center.  I asked if he would like to be referred to a closer cancer center.  After some discussion, they would like to come her for care. The son states I will bring him to every appt but then states he needs help with transportation.  I will refer to CSW for assistance.  Coordination of care-Arthur [name] has a lot of other medical issues going on and I helped to explain who to contact for each issue.  The patient seemed to be confused but the granddaughter with him understood and will contact PCP, cardiology, and urology for other medical issues.   Dr. Julien Nordmann updated on above.    Abnormal Finding Date 02/25/2018  Confirmed Diagnosis Date 03/10/2018  Patient Visit Type MedOnc  Treatment Phase Pre-Tx/Tx Discussion  Barriers/Navigation Needs Education;Coordination of Care  Education Understanding Cancer/ Treatment Options;Other  Interventions Coordination of Care;Education  Coordination of Care Other  Education Method Verbal  Acuity Level 2  Time Spent with Patient > 120

## 2018-03-29 ENCOUNTER — Inpatient Hospital Stay: Payer: Medicare Other

## 2018-03-29 ENCOUNTER — Encounter (HOSPITAL_COMMUNITY): Payer: Self-pay

## 2018-03-29 VITALS — BP 130/74 | HR 74 | Temp 98.4°F | Resp 20

## 2018-03-29 DIAGNOSIS — C3411 Malignant neoplasm of upper lobe, right bronchus or lung: Secondary | ICD-10-CM | POA: Diagnosis not present

## 2018-03-29 DIAGNOSIS — C3491 Malignant neoplasm of unspecified part of right bronchus or lung: Secondary | ICD-10-CM

## 2018-03-29 MED ORDER — SODIUM CHLORIDE 0.9 % IV SOLN: 10.0000 mg | Freq: Once | INTRAVENOUS | Status: DC

## 2018-03-29 MED ORDER — SODIUM CHLORIDE 0.9 % IV SOLN
Freq: Once | INTRAVENOUS | Status: AC
  Administered 2018-03-29: 15:00:00 via INTRAVENOUS
  Filled 2018-03-29: qty 250

## 2018-03-29 MED ORDER — SODIUM CHLORIDE 0.9 % IV SOLN
100.0000 mg/m2 | Freq: Once | INTRAVENOUS | Status: AC
  Administered 2018-03-29: 190 mg via INTRAVENOUS
  Filled 2018-03-29: qty 9.5

## 2018-03-29 MED ORDER — DEXAMETHASONE SODIUM PHOSPHATE 10 MG/ML IJ SOLN
10.0000 mg | Freq: Once | INTRAMUSCULAR | Status: AC
  Administered 2018-03-29: 10 mg via INTRAVENOUS

## 2018-03-29 MED ORDER — DEXAMETHASONE SODIUM PHOSPHATE 10 MG/ML IJ SOLN
INTRAMUSCULAR | Status: AC
  Filled 2018-03-29: qty 1

## 2018-03-29 NOTE — Patient Instructions (Addendum)
Keldan Eplin  03/29/2018   Your procedure is scheduled on: 04-13-18   Report to [facility] Main  Entrance    Report to admitting at 12:00PM    Call this number if you have problems the morning of surgery [phone]     Remember: Do not eat food After Midnight. YOU MAY HAVE CLEAR LIQUIDS FROM MIDNIGHT UNTIL 8:00AM. NOTHING BY MOUTH AFTER 8:00AM!    CLEAR LIQUID DIET   Foods Allowed                                                                     Foods Excluded  Coffee and tea, regular and decaf                             liquids that you cannot  Plain Jell-O in any flavor                                             see through such as: Fruit ices (not with fruit pulp)                                     milk, soups, orange juice  Iced Popsicles                                    All solid food Carbonated beverages, regular and diet                                    Cranberry, grape and apple juices Sports drinks like Gatorade Lightly seasoned clear broth or consume(fat free) Sugar, honey syrup  Sample Menu Breakfast                                Lunch                                     Supper Cranberry juice                    Beef broth                            Chicken broth Jell-O                                     Grape juice                           Apple juice Coffee or tea  Jell-O                                      Popsicle                                                Coffee or tea                        Coffee or tea  _____________________________________________________________________       Take these medicines the morning of surgery with A SIP OF WATER: AMLODIPINE, ISOSORBIDE, FENOFIBRATE, METOPROLOL, PANTOPRAZOLE,  IF NEEDED                                You may not have any metal on your body including hair pins and              piercings  Do not wear jewelry, make-up, lotions, powders or perfumes,  deodorant              Men may shave face and neck.   Do not bring valuables to the hospital. [place].  Contacts, dentures or bridgework may not be worn into surgery.     Patients discharged the day of surgery will not be allowed to drive home.  Name and phone number of your driver:  Special Instructions: N/A              Please read over the following fact sheets you were given: _____________________________________________________________________             [facility] - Preparing for Surgery Before surgery, you can play an important role.  Because skin is not sterile, your skin needs to be as free of germs as possible.  You can reduce the number of germs on your skin by washing with CHG (chlorahexidine gluconate) soap before surgery.  CHG is an antiseptic cleaner which kills germs and bonds with the skin to continue killing germs even after washing. Please DO NOT use if you have an allergy to CHG or antibacterial soaps.  If your skin becomes reddened/irritated stop using the CHG and inform your nurse when you arrive at Short Stay. Do not shave (including legs and underarms) for at least 48 hours prior to the first CHG shower.  You may shave your face/neck. Please follow these instructions carefully:  1.  Shower with CHG Soap the night before surgery and the  morning of Surgery.  2.  If you choose to wash your hair, wash your hair first as usual with your  normal  shampoo.  3.  After you shampoo, rinse your hair and body thoroughly to remove the  shampoo.                           4.  Use CHG as you would any other liquid soap.  You can apply chg directly  to the skin and wash  Gently with a scrungie or clean washcloth.  5.  Apply the CHG Soap to your body ONLY FROM THE NECK DOWN.   Do not use on face/ open                           Wound or open sores. Avoid contact with eyes, ears mouth and genitals (private  parts).                       Wash face,  Genitals (private parts) with your normal soap.             6.  Wash thoroughly, paying special attention to the area where your surgery  will be performed.  7.  Thoroughly rinse your body with warm water from the neck down.  8.  DO NOT shower/wash with your normal soap after using and rinsing off  the CHG Soap.                9.  Pat yourself dry with a clean towel.            10.  Wear clean pajamas.            11.  Place clean sheets on your bed the night of your first shower and do not  sleep with pets. Day of Surgery : Do not apply any lotions/deodorants the morning of surgery.  Please wear clean clothes to the hospital/surgery center.  FAILURE TO FOLLOW THESE INSTRUCTIONS MAY RESULT IN THE CANCELLATION OF YOUR SURGERY PATIENT SIGNATURE_________________________________  NURSE SIGNATURE__________________________________  ________________________________________________________________________     How to Manage Your Diabetes Before and After Surgery  Why is it important to control my blood sugar before and after surgery? . Improving blood sugar levels before and after surgery helps healing and can limit problems. . A way of improving blood sugar control is eating a healthy diet by: o  Eating less sugar and carbohydrates o  Increasing activity/exercise o  Talking with your doctor about reaching your blood sugar goals . High blood sugars (greater than 180 mg/dL) can raise your risk of infections and slow your recovery, so you will need to focus on controlling your diabetes during the weeks before surgery. . Make sure that the doctor who takes care of your diabetes knows about your planned surgery including the date and location.   WHAT DO I DO ABOUT MY DIABETES MEDICATION?   . THE NIGHT BEFORE SURGERY: DO NOT TAKE YOUR EVENING DOSE OF HUMALOG INSULIN      . THE MORNING OF SURGERY: If your BLOOD SUGAR is greater than 220 mg/dL, you may  take  of your  dose of HUMALOG insulin.   Patient Signature:  Date:   Nurse Signature:  Date:   Reviewed and Endorsed by [facility] Patient Education Committee, August 2015

## 2018-03-29 NOTE — Patient Instructions (Signed)
[facility] Discharge Instructions for Patients Receiving Chemotherapy  Today you received the following chemotherapy agents: Etoposide.  To help prevent nausea and vomiting after your treatment, we encourage you to take your nausea medication as directed.    If you develop nausea and vomiting that is not controlled by your nausea medication, call the clinic.   BELOW ARE SYMPTOMS THAT SHOULD BE REPORTED IMMEDIATELY:  *FEVER GREATER THAN 100.5 F  *CHILLS WITH OR WITHOUT FEVER  NAUSEA AND VOMITING THAT IS NOT CONTROLLED WITH YOUR NAUSEA MEDICATION  *UNUSUAL SHORTNESS OF BREATH  *UNUSUAL BRUISING OR BLEEDING  TENDERNESS IN MOUTH AND THROAT WITH OR WITHOUT PRESENCE OF ULCERS  *URINARY PROBLEMS  *BOWEL PROBLEMS  UNUSUAL RASH Items with * indicate a potential emergency and should be followed up as soon as possible.  Feel free to call the clinic should you have any questions or concerns. The clinic phone number is [phone].  Please show the [place] at check-in to the Emergency Department and triage nurse.

## 2018-03-29 NOTE — Progress Notes (Signed)
LOV ONCOLOGY DR. Earlie Server 03-18-18 Epic   ECHO 03-09-18 Epic   HGBA1C 03-09-18 Epic   CT CHEST 03-08-18 Epic   EKG 03-08-18 Epic

## 2018-03-30 ENCOUNTER — Encounter: Payer: Self-pay | Admitting: Cardiology

## 2018-03-30 ENCOUNTER — Ambulatory Visit: Payer: Medicare Other | Admitting: Cardiology

## 2018-03-30 ENCOUNTER — Encounter (HOSPITAL_COMMUNITY): Payer: Self-pay

## 2018-03-30 ENCOUNTER — Encounter (HOSPITAL_COMMUNITY)
Admission: RE | Admit: 2018-03-30 | Discharge: 2018-03-30 | Disposition: A | Discharge status: Home / Self Care | Payer: Medicare Other | Source: Ambulatory Visit | Attending: Urology | Admitting: Urology

## 2018-03-30 ENCOUNTER — Inpatient Hospital Stay: Payer: Medicare Other

## 2018-03-30 ENCOUNTER — Other Ambulatory Visit: Payer: Self-pay

## 2018-03-30 VITALS — BP 114/63 | HR 68 | Temp 98.4°F | Resp 16

## 2018-03-30 DIAGNOSIS — X58XXXA Exposure to other specified factors, initial encounter: Secondary | ICD-10-CM | POA: Diagnosis not present

## 2018-03-30 DIAGNOSIS — I739 Peripheral vascular disease, unspecified: Secondary | ICD-10-CM

## 2018-03-30 DIAGNOSIS — IMO0001 Reserved for inherently not codable concepts without codable children: Secondary | ICD-10-CM

## 2018-03-30 DIAGNOSIS — I214 Non-ST elevation (NSTEMI) myocardial infarction: Secondary | ICD-10-CM | POA: Diagnosis not present

## 2018-03-30 DIAGNOSIS — E119 Type 2 diabetes mellitus without complications: Secondary | ICD-10-CM | POA: Diagnosis not present

## 2018-03-30 DIAGNOSIS — K6389 Other specified diseases of intestine: Secondary | ICD-10-CM

## 2018-03-30 DIAGNOSIS — C3491 Malignant neoplasm of unspecified part of right bronchus or lung: Secondary | ICD-10-CM

## 2018-03-30 DIAGNOSIS — G319 Degenerative disease of nervous system, unspecified: Secondary | ICD-10-CM | POA: Diagnosis not present

## 2018-03-30 DIAGNOSIS — K8020 Calculus of gallbladder without cholecystitis without obstruction: Secondary | ICD-10-CM | POA: Diagnosis not present

## 2018-03-30 DIAGNOSIS — C7951 Secondary malignant neoplasm of bone: Secondary | ICD-10-CM | POA: Diagnosis not present

## 2018-03-30 DIAGNOSIS — C7972 Secondary malignant neoplasm of left adrenal gland: Secondary | ICD-10-CM | POA: Diagnosis not present

## 2018-03-30 DIAGNOSIS — I700 Atherosclerosis of aorta: Secondary | ICD-10-CM | POA: Diagnosis not present

## 2018-03-30 DIAGNOSIS — I2510 Atherosclerotic heart disease of native coronary artery without angina pectoris: Secondary | ICD-10-CM | POA: Diagnosis not present

## 2018-03-30 DIAGNOSIS — Z9861 Coronary angioplasty status: Secondary | ICD-10-CM

## 2018-03-30 DIAGNOSIS — I6782 Cerebral ischemia: Secondary | ICD-10-CM | POA: Diagnosis not present

## 2018-03-30 DIAGNOSIS — C3411 Malignant neoplasm of upper lobe, right bronchus or lung: Secondary | ICD-10-CM | POA: Diagnosis not present

## 2018-03-30 DIAGNOSIS — S42101A Fracture of unspecified part of scapula, right shoulder, initial encounter for closed fracture: Secondary | ICD-10-CM | POA: Diagnosis not present

## 2018-03-30 DIAGNOSIS — N402 Nodular prostate without lower urinary tract symptoms: Secondary | ICD-10-CM | POA: Diagnosis not present

## 2018-03-30 DIAGNOSIS — K639 Disease of intestine, unspecified: Secondary | ICD-10-CM

## 2018-03-30 DIAGNOSIS — Z794 Long term (current) use of insulin: Secondary | ICD-10-CM

## 2018-03-30 DIAGNOSIS — N289 Disorder of kidney and ureter, unspecified: Secondary | ICD-10-CM

## 2018-03-30 HISTORY — DX: Other cerebral infarction due to occlusion or stenosis of small artery: I63.81

## 2018-03-30 HISTORY — DX: Non-ST elevation (NSTEMI) myocardial infarction: I21.4

## 2018-03-30 HISTORY — DX: Personal history of antineoplastic chemotherapy: Z92.21

## 2018-03-30 HISTORY — DX: Aneurysm of the ascending aorta, without rupture: I71.21

## 2018-03-30 HISTORY — DX: Dyspnea, unspecified: R06.00

## 2018-03-30 HISTORY — DX: Malignant neoplasm of unspecified part of unspecified bronchus or lung: C34.90

## 2018-03-30 HISTORY — DX: Gastro-esophageal reflux disease without esophagitis: K21.9

## 2018-03-30 HISTORY — DX: Thoracic aortic aneurysm, without rupture: I71.2

## 2018-03-30 LAB — BASIC METABOLIC PANEL
BUN/Creatinine Ratio: 24 (ref 10–24)
BUN: 31 mg/dL — ABNORMAL HIGH (ref 8–27)
CO2: 24 mmol/L (ref 20–29)
Calcium: 9.5 mg/dL (ref 8.6–10.2)
Chloride: 95 mmol/L — ABNORMAL LOW (ref 96–106)
Creatinine, Ser: 1.3 mg/dL — ABNORMAL HIGH (ref 0.76–1.27)
GFR calc Af Amer: 60 mL/min/{1.73_m2} (ref >59)
GFR calc non Af Amer: 52 mL/min/{1.73_m2} — ABNORMAL LOW (ref >59)
Glucose: 134 mg/dL — ABNORMAL HIGH (ref 65–99)
Potassium: 5.3 mmol/L — ABNORMAL HIGH (ref 3.5–5.2)
Sodium: 133 mmol/L — ABNORMAL LOW (ref 134–144)

## 2018-03-30 LAB — GLUCOSE, CAPILLARY: Glucose-Capillary: 159 mg/dL — ABNORMAL HIGH (ref 70–99)

## 2018-03-30 LAB — CBC
HEMATOCRIT: 31.9 % — AB (ref 39.0–52.0)
Hemoglobin: 10.4 g/dL — ABNORMAL LOW (ref 13.0–17.0)
MCH: 29.6 pg (ref 26.0–34.0)
MCHC: 32.6 g/dL (ref 30.0–36.0)
MCV: 90.9 fL (ref 78.0–100.0)
Platelets: 335 10*3/uL (ref 150–400)
RBC: 3.51 MIL/uL — ABNORMAL LOW (ref 4.22–5.81)
RDW: 13.9 % (ref 11.5–15.5)
WBC: 10.3 10*3/uL (ref 4.0–10.5)

## 2018-03-30 MED ORDER — NITROGLYCERIN 0.4 MG SL SUBL: 0.4000 mg | SUBLINGUAL_TABLET | SUBLINGUAL | 4 refills | Status: DC | PRN

## 2018-03-30 MED ORDER — SODIUM CHLORIDE 0.9 % IV SOLN
100.0000 mg/m2 | Freq: Once | INTRAVENOUS | Status: AC
  Administered 2018-03-30: 190 mg via INTRAVENOUS
  Filled 2018-03-30: qty 9.5

## 2018-03-30 MED ORDER — SODIUM CHLORIDE 0.9 % IV SOLN
Freq: Once | INTRAVENOUS | Status: AC
  Administered 2018-03-30: 11:00:00 via INTRAVENOUS
  Filled 2018-03-30: qty 250

## 2018-03-30 MED ORDER — NITROGLYCERIN 0.4 MG SL SUBL: 0.4000 mg | SUBLINGUAL_TABLET | SUBLINGUAL | 4 refills | Status: AC | PRN

## 2018-03-30 MED ORDER — DEXAMETHASONE SODIUM PHOSPHATE 10 MG/ML IJ SOLN
10.0000 mg | Freq: Once | INTRAMUSCULAR | Status: AC
  Administered 2018-03-30: 10 mg via INTRAVENOUS

## 2018-03-30 MED ORDER — AMLODIPINE BESYLATE 5 MG PO TABS: 5.0000 mg | ORAL_TABLET | Freq: Every day | ORAL | 3 refills | Status: DC

## 2018-03-30 MED ORDER — DEXAMETHASONE SODIUM PHOSPHATE 10 MG/ML IJ SOLN
INTRAMUSCULAR | Status: AC
  Filled 2018-03-30: qty 1

## 2018-03-30 NOTE — Patient Instructions (Signed)
[facility] Discharge Instructions for Patients Receiving Chemotherapy  Today you received the following chemotherapy agents: Etoposide.  To help prevent nausea and vomiting after your treatment, we encourage you to take your nausea medication as directed.    If you develop nausea and vomiting that is not controlled by your nausea medication, call the clinic.   BELOW ARE SYMPTOMS THAT SHOULD BE REPORTED IMMEDIATELY:  *FEVER GREATER THAN 100.5 F  *CHILLS WITH OR WITHOUT FEVER  NAUSEA AND VOMITING THAT IS NOT CONTROLLED WITH YOUR NAUSEA MEDICATION  *UNUSUAL SHORTNESS OF BREATH  *UNUSUAL BRUISING OR BLEEDING  TENDERNESS IN MOUTH AND THROAT WITH OR WITHOUT PRESENCE OF ULCERS  *URINARY PROBLEMS  *BOWEL PROBLEMS  UNUSUAL RASH Items with * indicate a potential emergency and should be followed up as soon as possible.  Feel free to call the clinic should you have any questions or concerns. The clinic phone number is [phone].  Please show the [place] at check-in to the Emergency Department and triage nurse.

## 2018-03-30 NOTE — Patient Instructions (Addendum)
Medication Instructions: Your physician recommends that you continue on your current medications as directed. Please refer to the Current Medication list given to you today.  STOP Losartan  START Amlodipine 5 mg daily.   Labwork: Your physician recommends that you return for lab work today--BMET   Follow-Up: Your physician recommends that you schedule a follow-up appointment in: 6-8 weeks with Dr. Gwenlyn Found.  If you need a refill on your cardiac medications before your next appointment, please call your pharmacy.

## 2018-03-30 NOTE — Progress Notes (Signed)
03/30/2018 Arthur [name]   07/11/1941  [id number]  Primary Physician Dione Housekeeper, MD Primary Cardiologist: Dr Gwenlyn Found  HPI:  77 y.o. male a history of CADwith prior PCI to LCX in 1997, last stress in 2012 neg for ischemia, PAD, DM2,HTN, HLDpresented 03/08/18 with 1 month of shortness of breath to [facility]. He had elevated troponin-6, and was transferred to [facility] for cardiac cath. After transfer he had CT of chest which showed a mass. Work up revealed small cell ling cancer with metastasis and ureteral obstruction. The recommendations are for medical rx of his CAD- no cath was done. EF by echo was 35-40%. He has seen Dr Julien Nordmann as an OP and has started palliative chemotherapy. He needs to have a ureteral stent placed (Dr Tresa Moore) and this is scheduled for 9/25. He is in the office today as a post hospital follow up. He denies angina. He is still SOB but comfortable at rest. His main complaint is Rt flank pain.    Current Outpatient Medications  Medication Sig Dispense Refill  . aspirin EC 81 MG EC tablet Take 1 tablet (81 mg total) by mouth daily. 30 tablet 1  . atorvastatin (LIPITOR) 20 MG tablet TAKE 1 TABLET (20 MG TOTAL) BY MOUTH DAILY. (Patient taking differently: Take 20 mg by mouth daily at 6 PM. ) 90 tablet 2  . Cholecalciferol (VITAMIN D) 2000 units CAPS Take 2,000 Units by mouth daily.     . clopidogrel (PLAVIX) 75 MG tablet Take 1 tablet (75 mg total) by mouth daily. 30 tablet 1  . doxazosin (CARDURA) 1 MG tablet Take 3 mg by mouth at bedtime. Pt takes 3 at bedtime     . fenofibrate 160 MG tablet TAKE 1 TABLET (160 MG TOTAL) BY MOUTH DAILY. 30 tablet 6  . fish oil-omega-3 fatty acids 1000 MG capsule Take 2 g by mouth daily.     Marland Kitchen HYDROmorphone (DILAUDID) 2 MG tablet Take 1 tablet (2 mg total) by mouth every 6 (six) hours as needed for severe pain. 30 tablet 0  . insulin lispro (HUMALOG) 100 UNIT/ML injection Inject 0.35 mLs (35 Units total) into the skin 2 (two) times daily. 10 mL  11  . isosorbide mononitrate (IMDUR) 30 MG 24 hr tablet Take 1 tablet (30 mg total) by mouth daily. 30 tablet 1  . metoprolol succinate (TOPROL-XL) 50 MG 24 hr tablet Take 1 tablet (50 mg total) by mouth daily. Take with or immediately following a meal. 30 tablet 1  . Misc Natural Products (OSTEO BI-FLEX TRIPLE STRENGTH) TABS Take 1 tablet by mouth 2 (two) times daily.    . Multiple Vitamins-Minerals (CENTRUM SILVER PO) Take 1 tablet by mouth daily.    . nitroGLYCERIN (NITROSTAT) 0.4 MG SL tablet Place 1 tablet (0.4 mg total) under the tongue every 5 (five) minutes x 3 doses as needed for chest pain. 30 tablet 1  . oxybutynin (DITROPAN) 5 MG tablet Take 5 mg by mouth daily.     . pantoprazole (PROTONIX) 40 MG tablet Take 1 tablet (40 mg total) by mouth daily. 30 tablet 1  . prochlorperazine (COMPAZINE) 10 MG tablet Take 1 tablet (10 mg total) by mouth every 6 (six) hours as needed for nausea or vomiting. 30 tablet 0  . amLODipine (NORVASC) 5 MG tablet Take 1 tablet (5 mg total) by mouth daily. 180 tablet 3   No current facility-administered medications for this visit.     Allergies  Allergen Reactions  . Codeine Swelling  Past Medical History:  Diagnosis Date  . Arthritis   . Ascending aortic aneurysm (HCC)    PER CT CHEST 03-08-18 IN EPIC , 4.0CM  . Chronic kidney disease   . Claudication ([place])   . Coronary artery disease 1997   CFX PCI, '97. Mod residual RI and RCA dis.  . Diabetes mellitus ([place])    IDDM  . Dyslipidemia   . Hard of hearing   . History of bronchitis   . History of chemotherapy   . History of kidney stones 1958  . HTN (hypertension)   . Lacunar stroke ([place])   . Metastatic lung cancer (metastasis from lung to other site), unspecified laterality ([place])    DIAGNOSED 02-2018, MANAGED BY DR [place]  . NSTEMI (non-ST elevated myocardial infarction) ([place])   . PAD (peripheral artery disease) ([place])   . Pneumonia   . Tobacco abuse   . Urinary  incontinence   . Wears glasses     Social History   Socioeconomic History  . Marital status: Married    Spouse name: Not on file  . Number of children: Not on file  . Years of education: Not on file  . Highest education level: Not on file  Occupational History  . Not on file  Social Needs  . Financial resource strain: Not on file  . Food insecurity:    Worry: Not on file    Inability: Not on file  . Transportation needs:    Medical: Not on file    Non-medical: Not on file  Tobacco Use  . Smoking status: Former Smoker    Packs/day: 0.25    Years: 65.00    Pack years: 16.25    Types: Cigarettes  . Smokeless tobacco: Never Used  . Tobacco comment: 05/13/2016 "stopped smoking ~ 1 month ago"  Substance and Sexual Activity  . Alcohol use: No  . Drug use: No  . Sexual activity: Not on file  Lifestyle  . Physical activity:    Days per week: Not on file    Minutes per session: Not on file  . Stress: Not on file  Relationships  . Social connections:    Talks on phone: Not on file    Gets together: Not on file    Attends religious service: Not on file    Active member of club or organization: Not on file    Attends meetings of clubs or organizations: Not on file    Relationship status: Not on file  . Intimate partner violence:    Fear of current or ex partner: Not on file    Emotionally abused: Not on file    Physically abused: Not on file    Forced sexual activity: Not on file  Other Topics Concern  . Not on file  Social History Narrative   Retired from [employer]. Married 2 children, 11 grand children, 6 great grand children     Family History  Problem Relation Age of Onset  . Diabetes Sister   . Arthritis Mother   . Heart disease Father      Review of Systems: General: negative for chills, fever, night sweats or weight changes.  Cardiovascular: negative for chest pain, dyspnea on exertion, edema, orthopnea, palpitations, paroxysmal nocturnal dyspnea or  shortness of breath Dermatological: negative for rash Respiratory: negative for cough or wheezing Urologic: negative for hematuria Abdominal: negative for nausea, vomiting, diarrhea, bright red blood per rectum, melena, or hematemesis Rt sided flank pain Neurologic: negative  for visual changes, syncope, or dizziness All other systems reviewed and are otherwise negative except as noted above.    Blood pressure (!) 148/74, pulse 76, height 5\' 9"  (1.753 m), weight 168 lb 3.2 oz (76.3 kg), SpO2 95 %.  General appearance: alert, cooperative and no distress Neck: no JVD and walnut sized firm fixed non tender mass Rt neck Lungs: clear to auscultation bilaterally Heart: regular rate and rhythm Extremities: no edema Skin: warm and dry Neurologic: Grossly normal  EKG NSR, new inferior TWI  ASSESSMENT AND PLAN:   NSTEMI (non-ST elevated myocardial infarction) ([place]) Pt admitted 03/08/18 with NSTEMI- Troponin 6, EF 40%. Conservative treatment secondary to new diagnosis of small cell lung cancer with mets  CAD S/P percutaneous coronary angioplasty CFX PCI 1997  PAD (peripheral artery disease) ([place]) RLE endarterectomy by Dr Trula Slade Oct 2017  Insulin dependent diabetes mellitus [facility]) IDDM  Small cell lung cancer, right [facility]) New diagnosis Aug 2019. Pt to undergo palliative chemotherapy  Mesenteric mass Pt to have a ureteral stent placed-9/25  Renal insufficiency SCr 1.5 at discharge- hold ARB and add Norvasc for HTN. Consider resuming Cozaar post ureteral stent if his renal function stabilizes.     PLAN  OK to proceed with ureteral stent. OK to hold Plavix pre op. Stop Cozaar with renal insuficiency and add Norvasc 5 mg for HTN. If his renal function stabilizes post stent we can resume Cozaar. Check BMP today.  Kerin Ransom PA-C 03/30/2018 8:21 AM

## 2018-03-30 NOTE — Assessment & Plan Note (Signed)
IDDM 

## 2018-03-30 NOTE — Assessment & Plan Note (Signed)
New diagnosis Aug 2019. Pt to undergo palliative chemotherapy

## 2018-03-30 NOTE — Assessment & Plan Note (Signed)
CFX PCI 1997

## 2018-03-30 NOTE — Assessment & Plan Note (Signed)
SCr 1.5 at discharge- hold ARB and add Norvasc for HTN. Consider resuming Cozaar post ureteral stent if his renal function stabilizes.

## 2018-03-30 NOTE — Assessment & Plan Note (Signed)
Pt to have a ureteral stent placed-9/25

## 2018-03-30 NOTE — Assessment & Plan Note (Signed)
RLE endarterectomy by Dr Trula Slade Oct 2017

## 2018-03-30 NOTE — Assessment & Plan Note (Signed)
Pt admitted 03/08/18 with NSTEMI- Troponin 6, EF 40%. Conservative treatment secondary to new diagnosis of small cell lung cancer with mets

## 2018-03-31 ENCOUNTER — Ambulatory Visit (HOSPITAL_COMMUNITY)
Admission: RE | Admit: 2018-03-31 | Discharge: 2018-03-31 | Disposition: A | Discharge status: Home / Self Care | Payer: Medicare Other | Source: Ambulatory Visit | Attending: Internal Medicine | Admitting: Internal Medicine

## 2018-03-31 DIAGNOSIS — K8020 Calculus of gallbladder without cholecystitis without obstruction: Secondary | ICD-10-CM | POA: Insufficient documentation

## 2018-03-31 DIAGNOSIS — C3411 Malignant neoplasm of upper lobe, right bronchus or lung: Secondary | ICD-10-CM | POA: Diagnosis not present

## 2018-03-31 DIAGNOSIS — X58XXXA Exposure to other specified factors, initial encounter: Secondary | ICD-10-CM | POA: Insufficient documentation

## 2018-03-31 DIAGNOSIS — C3491 Malignant neoplasm of unspecified part of right bronchus or lung: Secondary | ICD-10-CM

## 2018-03-31 DIAGNOSIS — C7951 Secondary malignant neoplasm of bone: Secondary | ICD-10-CM | POA: Insufficient documentation

## 2018-03-31 DIAGNOSIS — G319 Degenerative disease of nervous system, unspecified: Secondary | ICD-10-CM | POA: Insufficient documentation

## 2018-03-31 DIAGNOSIS — C7972 Secondary malignant neoplasm of left adrenal gland: Secondary | ICD-10-CM | POA: Insufficient documentation

## 2018-03-31 DIAGNOSIS — I700 Atherosclerosis of aorta: Secondary | ICD-10-CM | POA: Insufficient documentation

## 2018-03-31 DIAGNOSIS — I2510 Atherosclerotic heart disease of native coronary artery without angina pectoris: Secondary | ICD-10-CM | POA: Insufficient documentation

## 2018-03-31 DIAGNOSIS — N402 Nodular prostate without lower urinary tract symptoms: Secondary | ICD-10-CM | POA: Insufficient documentation

## 2018-03-31 DIAGNOSIS — I6782 Cerebral ischemia: Secondary | ICD-10-CM | POA: Insufficient documentation

## 2018-03-31 DIAGNOSIS — S42101A Fracture of unspecified part of scapula, right shoulder, initial encounter for closed fracture: Secondary | ICD-10-CM | POA: Insufficient documentation

## 2018-03-31 LAB — GLUCOSE, CAPILLARY: GLUCOSE-CAPILLARY: 131 mg/dL — AB (ref 70–99)

## 2018-03-31 MED ORDER — FLUDEOXYGLUCOSE F - 18 (FDG) INJECTION
8.4000 | Freq: Once | INTRAVENOUS | Status: AC
  Administered 2018-03-31: 8.4 via INTRAVENOUS

## 2018-03-31 MED ORDER — GADOBUTROL 1 MMOL/ML IV SOLN
7.5000 mL | Freq: Once | INTRAVENOUS | Status: AC | PRN
  Administered 2018-03-31: 7 mL via INTRAVENOUS

## 2018-04-01 ENCOUNTER — Ambulatory Visit: Payer: Medicare Other

## 2018-04-02 ENCOUNTER — Inpatient Hospital Stay: Payer: Medicare Other

## 2018-04-02 VITALS — BP 140/82 | HR 75 | Temp 97.7°F | Resp 18

## 2018-04-02 DIAGNOSIS — C3491 Malignant neoplasm of unspecified part of right bronchus or lung: Secondary | ICD-10-CM

## 2018-04-02 DIAGNOSIS — C3411 Malignant neoplasm of upper lobe, right bronchus or lung: Secondary | ICD-10-CM | POA: Diagnosis not present

## 2018-04-02 MED ORDER — PEGFILGRASTIM-CBQV 6 MG/0.6ML ~~LOC~~ SOSY
6.0000 mg | PREFILLED_SYRINGE | Freq: Once | SUBCUTANEOUS | Status: AC
  Administered 2018-04-02: 6 mg via SUBCUTANEOUS

## 2018-04-02 MED ORDER — PEGFILGRASTIM-CBQV 6 MG/0.6ML ~~LOC~~ SOSY
PREFILLED_SYRINGE | SUBCUTANEOUS | Status: AC
  Filled 2018-04-02: qty 0.6

## 2018-04-02 NOTE — Patient Instructions (Signed)
Pegfilgrastim injection What is this medicine? PEGFILGRASTIM (PEG fil gra stim) is a long-acting granulocyte colony-stimulating factor that stimulates the growth of neutrophils, a type of white blood cell important in the body's fight against infection. It is used to reduce the incidence of fever and infection in patients with certain types of cancer who are receiving chemotherapy that affects the bone marrow, and to increase survival after being exposed to high doses of radiation. This medicine may be used for other purposes; ask your health care provider or pharmacist if you have questions. COMMON BRAND NAME(S): Neulasta What should I tell my health care provider before I take this medicine? They need to know if you have any of these conditions: -kidney disease -latex allergy -ongoing radiation therapy -sickle cell disease -skin reactions to acrylic adhesives (On-Body Injector only) -an unusual or allergic reaction to pegfilgrastim, filgrastim, other medicines, foods, dyes, or preservatives -pregnant or trying to get pregnant -breast-feeding How should I use this medicine? This medicine is for injection under the skin. If you get this medicine at home, you will be taught how to prepare and give the pre-filled syringe or how to use the On-body Injector. Refer to the patient Instructions for Use for detailed instructions. Use exactly as directed. Tell your healthcare provider immediately if you suspect that the On-body Injector may not have performed as intended or if you suspect the use of the On-body Injector resulted in a missed or partial dose. It is important that you put your used needles and syringes in a special sharps container. Do not put them in a trash can. If you do not have a sharps container, call your pharmacist or healthcare provider to get one. Talk to your pediatrician regarding the use of this medicine in children. While this drug may be prescribed for selected conditions,  precautions do apply. Overdosage: If you think you have taken too much of this medicine contact a poison control center or emergency room at once. NOTE: This medicine is only for you. Do not share this medicine with others. What if I miss a dose? It is important not to miss your dose. Call your doctor or health care professional if you miss your dose. If you miss a dose due to an On-body Injector failure or leakage, a new dose should be administered as soon as possible using a single prefilled syringe for manual use. What may interact with this medicine? Interactions have not been studied. Give your health care provider a list of all the medicines, herbs, non-prescription drugs, or dietary supplements you use. Also tell them if you smoke, drink alcohol, or use illegal drugs. Some items may interact with your medicine. This list may not describe all possible interactions. Give your health care provider a list of all the medicines, herbs, non-prescription drugs, or dietary supplements you use. Also tell them if you smoke, drink alcohol, or use illegal drugs. Some items may interact with your medicine. What should I watch for while using this medicine? You may need blood work done while you are taking this medicine. If you are going to need a MRI, CT scan, or other procedure, tell your doctor that you are using this medicine (On-Body Injector only). What side effects may I notice from receiving this medicine? Side effects that you should report to your doctor or health care professional as soon as possible: -allergic reactions like skin rash, itching or hives, swelling of the face, lips, or tongue -dizziness -fever -pain, redness, or irritation at site   where injected -pinpoint red spots on the skin -red or dark-brown urine -shortness of breath or breathing problems -stomach or side pain, or pain at the shoulder -swelling -tiredness -trouble passing urine or change in the amount of urine Side  effects that usually do not require medical attention (report to your doctor or health care professional if they continue or are bothersome): -bone pain -muscle pain This list may not describe all possible side effects. Call your doctor for medical advice about side effects. You may report side effects to FDA at 1-800-FDA-1088. Where should I keep my medicine? Keep out of the reach of children. Store pre-filled syringes in a refrigerator between 2 and 8 degrees C (36 and 46 degrees F). Do not freeze. Keep in carton to protect from light. Throw away this medicine if it is left out of the refrigerator for more than 48 hours. Throw away any unused medicine after the expiration date. NOTE: This sheet is a summary. It may not cover all possible information. If you have questions about this medicine, talk to your doctor, pharmacist, or health care provider.  2018 Elsevier/Gold Standard (2016-07-02 12:58:03)  

## 2018-04-04 ENCOUNTER — Other Ambulatory Visit: Payer: Medicare Other

## 2018-04-05 ENCOUNTER — Telehealth: Payer: Self-pay | Admitting: Medical Oncology

## 2018-04-05 ENCOUNTER — Other Ambulatory Visit: Payer: Self-pay | Admitting: Medical Oncology

## 2018-04-05 ENCOUNTER — Inpatient Hospital Stay: Payer: Medicare Other

## 2018-04-05 ENCOUNTER — Telehealth: Payer: Self-pay | Admitting: Internal Medicine

## 2018-04-05 ENCOUNTER — Inpatient Hospital Stay (HOSPITAL_BASED_OUTPATIENT_CLINIC_OR_DEPARTMENT_OTHER): Payer: Medicare Other | Admitting: Internal Medicine

## 2018-04-05 ENCOUNTER — Telehealth: Payer: Self-pay | Admitting: Cardiovascular Disease

## 2018-04-05 ENCOUNTER — Encounter: Payer: Self-pay | Admitting: Internal Medicine

## 2018-04-05 VITALS — BP 117/69 | HR 86 | Temp 97.8°F | Resp 18 | Ht 68.0 in | Wt 164.2 lb

## 2018-04-05 DIAGNOSIS — C7951 Secondary malignant neoplasm of bone: Secondary | ICD-10-CM | POA: Diagnosis not present

## 2018-04-05 DIAGNOSIS — I313 Pericardial effusion (noninflammatory): Secondary | ICD-10-CM

## 2018-04-05 DIAGNOSIS — I700 Atherosclerosis of aorta: Secondary | ICD-10-CM

## 2018-04-05 DIAGNOSIS — E119 Type 2 diabetes mellitus without complications: Secondary | ICD-10-CM

## 2018-04-05 DIAGNOSIS — I739 Peripheral vascular disease, unspecified: Secondary | ICD-10-CM

## 2018-04-05 DIAGNOSIS — C3491 Malignant neoplasm of unspecified part of right bronchus or lung: Secondary | ICD-10-CM

## 2018-04-05 DIAGNOSIS — I2510 Atherosclerotic heart disease of native coronary artery without angina pectoris: Secondary | ICD-10-CM

## 2018-04-05 DIAGNOSIS — C3411 Malignant neoplasm of upper lobe, right bronchus or lung: Secondary | ICD-10-CM

## 2018-04-05 DIAGNOSIS — Z79899 Other long term (current) drug therapy: Secondary | ICD-10-CM

## 2018-04-05 DIAGNOSIS — J90 Pleural effusion, not elsewhere classified: Secondary | ICD-10-CM

## 2018-04-05 DIAGNOSIS — C7972 Secondary malignant neoplasm of left adrenal gland: Secondary | ICD-10-CM

## 2018-04-05 DIAGNOSIS — Z87442 Personal history of urinary calculi: Secondary | ICD-10-CM

## 2018-04-05 DIAGNOSIS — Z5111 Encounter for antineoplastic chemotherapy: Secondary | ICD-10-CM | POA: Diagnosis not present

## 2018-04-05 DIAGNOSIS — N189 Chronic kidney disease, unspecified: Secondary | ICD-10-CM

## 2018-04-05 DIAGNOSIS — R5383 Other fatigue: Secondary | ICD-10-CM

## 2018-04-05 DIAGNOSIS — R531 Weakness: Secondary | ICD-10-CM

## 2018-04-05 DIAGNOSIS — I714 Abdominal aortic aneurysm, without rupture: Secondary | ICD-10-CM

## 2018-04-05 DIAGNOSIS — Z5112 Encounter for antineoplastic immunotherapy: Secondary | ICD-10-CM

## 2018-04-05 DIAGNOSIS — I10 Essential (primary) hypertension: Secondary | ICD-10-CM

## 2018-04-05 DIAGNOSIS — E785 Hyperlipidemia, unspecified: Secondary | ICD-10-CM

## 2018-04-05 DIAGNOSIS — C778 Secondary and unspecified malignant neoplasm of lymph nodes of multiple regions: Secondary | ICD-10-CM

## 2018-04-05 DIAGNOSIS — N200 Calculus of kidney: Secondary | ICD-10-CM

## 2018-04-05 DIAGNOSIS — K219 Gastro-esophageal reflux disease without esophagitis: Secondary | ICD-10-CM

## 2018-04-05 DIAGNOSIS — I252 Old myocardial infarction: Secondary | ICD-10-CM

## 2018-04-05 DIAGNOSIS — Z720 Tobacco use: Secondary | ICD-10-CM

## 2018-04-05 DIAGNOSIS — I129 Hypertensive chronic kidney disease with stage 1 through stage 4 chronic kidney disease, or unspecified chronic kidney disease: Secondary | ICD-10-CM

## 2018-04-05 DIAGNOSIS — M129 Arthropathy, unspecified: Secondary | ICD-10-CM

## 2018-04-05 DIAGNOSIS — Z7982 Long term (current) use of aspirin: Secondary | ICD-10-CM

## 2018-04-05 LAB — CMP (CANCER CENTER ONLY)
ALT: 13 U/L (ref 0–44)
AST: 17 U/L (ref 15–41)
Albumin: 3 g/dL — ABNORMAL LOW (ref 3.5–5.0)
Alkaline Phosphatase: 65 U/L (ref 38–126)
Anion gap: 6 (ref 5–15)
BUN: 17 mg/dL (ref 8–23)
CHLORIDE: 97 mmol/L — AB (ref 98–111)
CO2: 27 mmol/L (ref 22–32)
Calcium: 8.9 mg/dL (ref 8.9–10.3)
Creatinine: 1.19 mg/dL (ref 0.61–1.24)
GFR, Estimated: 56 mL/min — ABNORMAL LOW (ref >60)
Glucose, Bld: 153 mg/dL — ABNORMAL HIGH (ref 70–99)
POTASSIUM: 4.4 mmol/L (ref 3.5–5.1)
SODIUM: 130 mmol/L — AB (ref 135–145)
Total Bilirubin: 0.8 mg/dL (ref 0.3–1.2)
Total Protein: 6.2 g/dL — ABNORMAL LOW (ref 6.5–8.1)

## 2018-04-05 LAB — CBC WITH DIFFERENTIAL (CANCER CENTER ONLY)
Basophils Absolute: 0 10*3/uL (ref 0.0–0.1)
Basophils Relative: 1 %
Eosinophils Absolute: 0.1 10*3/uL (ref 0.0–0.5)
Eosinophils Relative: 5 %
HCT: 26.6 % — ABNORMAL LOW (ref 38.4–49.9)
Hemoglobin: 8.9 g/dL — ABNORMAL LOW (ref 13.0–17.1)
Lymphocytes Relative: 60 %
Lymphs Abs: 0.7 10*3/uL — ABNORMAL LOW (ref 0.9–3.3)
MCH: 29.4 pg (ref 27.2–33.4)
MCHC: 33.5 g/dL (ref 32.0–36.0)
MCV: 87.8 fL (ref 79.3–98.0)
Monocytes Absolute: 0 10*3/uL — ABNORMAL LOW (ref 0.1–0.9)
Monocytes Relative: 3 %
Neutro Abs: 0.3 10*3/uL — CL (ref 1.5–6.5)
Neutrophils Relative %: 31 %
Platelet Count: 96 10*3/uL — ABNORMAL LOW (ref 140–400)
RBC: 3.03 MIL/uL — ABNORMAL LOW (ref 4.20–5.82)
RDW: 13.3 % (ref 11.0–14.6)
WBC Count: 1.1 10*3/uL — ABNORMAL LOW (ref 4.0–10.3)

## 2018-04-05 MED ORDER — ISOSORBIDE MONONITRATE ER 30 MG PO TB24: 30.0000 mg | ORAL_TABLET | Freq: Every day | ORAL | 1 refills | Status: DC

## 2018-04-05 MED ORDER — METOPROLOL SUCCINATE ER 50 MG PO TB24: 50.0000 mg | ORAL_TABLET | Freq: Every day | ORAL | 1 refills | Status: DC

## 2018-04-05 NOTE — Telephone Encounter (Signed)
Spoke with Diane RN from cancer center. Patient has Rx for Metoprolol Tart and chart has both succinate and tartrate listed. Reviewed chart and advised Metoprolol tart changed to succ when patient d/c from hospital. Was to continue Metoprolol Succ after visit with Doreene Burke PA on 03/30/18. Rx for Isosorbide and Metoprolol Suc. For sent to [facility]. Message sent to scheduling for follow up appointment to be made.

## 2018-04-05 NOTE — Telephone Encounter (Signed)
LVM re: Neutropenic precautions and when to call.

## 2018-04-05 NOTE — Telephone Encounter (Signed)
F/u -October 14th 0800-labs and Dr Edrick Oh October 16th at 0845-appts given to pt son. Med reconciliation done over the phone with Dr Kennon Holter nurse. I instructed pt to go by Dr Edrick Oh to reconcile insulin orders and to report recent hypoglycemic event.

## 2018-04-05 NOTE — Telephone Encounter (Signed)
appts scheduled avs printed/ declined calendar per 9/17 los

## 2018-04-05 NOTE — Progress Notes (Signed)
[place] Telephone:([phone]) [phone]   Fax:([phone]  OFFICE PROGRESS NOTE  Dione Housekeeper, MD [place] [address]  DIAGNOSIS: Extensive stage (T2b, N3, M1c) small cell lung cancer, presented with large right upper lobe lung mass with extension to the right hilum in addition to the external and supraclavicular lymphadenopathy as well as metastatic disease to the retroperitoneal lymph nodes and left adrenal gland and suspicious right scapular bone lesion diagnosed in August 2019  PRIOR THERAPY: None.  CURRENT THERAPY: Systemic chemotherapy with carboplatin for AUC of 5 on day 1, etoposide 100 mg/M2 on days 1, 2 and 3 as well as Tecentriq 1200 mg IV every 3 weeks with Neulasta support.  First dose 03/28/2018.  INTERVAL HISTORY: Arthur [name] 77 y.o. male returns to the clinic today for follow-up visit accompanied by his son-in-law.  The patient continues to complain of increasing fatigue and weakness.  He started the first cycle of systemic chemotherapy with carboplatin, etoposide and Tecentriq last week.  He tolerated the first cycle of his treatment well with no significant adverse effects.  He had one episode of nausea improved with Compazine.  He denied having any current chest pain but has shortness of breath with exertion with mild cough and no hemoptysis.  He has no headache or visual changes.  He had several studies performed recently including a PET scan as well as MRI of the brain and he is here for evaluation and discussion of his discuss results and further recommendation regarding his condition.  MEDICAL HISTORY: Past Medical History:  Diagnosis Date  . Arthritis   . Ascending aortic aneurysm (HCC)    PER CT CHEST 03-08-18 IN [facility] , 4.0CM  . Chronic kidney disease   . Claudication ([place])   . Coronary artery disease 1997   CFX PCI, '97. Mod residual RI and RCA dis.  . Diabetes mellitus ([place])    IDDM  . Dyslipidemia   . Dyspnea   . GERD  (gastroesophageal reflux disease)   . Hard of hearing   . History of bronchitis   . History of chemotherapy   . History of kidney stones 1958  . HTN (hypertension)   . Lacunar stroke ([place])   . Metastatic lung cancer (metastasis from lung to other site), unspecified laterality ([place])    DIAGNOSED 02-2018, MANAGED BY DR Olney  . NSTEMI (non-ST elevated myocardial infarction) ([place])   . PAD (peripheral artery disease) ([place])   . Pneumonia   . Tobacco abuse   . Urinary incontinence   . Wears glasses     ALLERGIES:  is allergic to codeine.  MEDICATIONS:  Current Outpatient Medications  Medication Sig Dispense Refill  . amLODipine (NORVASC) 5 MG tablet Take 1 tablet (5 mg total) by mouth daily. 180 tablet 3  . aspirin EC 81 MG EC tablet Take 1 tablet (81 mg total) by mouth daily. 30 tablet 1  . atorvastatin (LIPITOR) 20 MG tablet TAKE 1 TABLET (20 MG TOTAL) BY MOUTH DAILY. (Patient taking differently: Take 20 mg by mouth daily at 6 PM. ) 90 tablet 2  . Cholecalciferol (VITAMIN D) 2000 units CAPS Take 2,000 Units by mouth daily.     . clopidogrel (PLAVIX) 75 MG tablet Take 1 tablet (75 mg total) by mouth daily. 30 tablet 1  . docusate sodium (COLACE) 100 MG capsule Take 100 mg by mouth 2 (two) times daily.    Marland Kitchen doxazosin (CARDURA) 1 MG tablet  Take 3 mg by mouth at bedtime. Pt takes 3 at bedtime     . fenofibrate 160 MG tablet TAKE 1 TABLET (160 MG TOTAL) BY MOUTH DAILY. 30 tablet 6  . fish oil-omega-3 fatty acids 1000 MG capsule Take 2 g by mouth daily.     Marland Kitchen HYDROmorphone (DILAUDID) 2 MG tablet Take 1 tablet (2 mg total) by mouth every 6 (six) hours as needed for severe pain. (Patient not taking: Reported on 03/30/2018) 30 tablet 0  . insulin lispro (HUMALOG) 100 UNIT/ML injection Inject 0.35 mLs (35 Units total) into the skin 2 (two) times daily. (Patient taking differently: Inject 25-40 Units into the skin 2 (two) times daily at 10 AM and 5 PM. ) 10 mL 11  . isosorbide  mononitrate (IMDUR) 30 MG 24 hr tablet Take 1 tablet (30 mg total) by mouth daily. 30 tablet 1  . meloxicam (MOBIC) 7.5 MG tablet Take 7.5 mg by mouth daily.    . metoprolol succinate (TOPROL-XL) 50 MG 24 hr tablet Take 1 tablet (50 mg total) by mouth daily. Take with or immediately following a meal. 30 tablet 1  . metoprolol tartrate (LOPRESSOR) 25 MG tablet Take 25 mg by mouth 2 (two) times daily.    . Misc Natural Products (OSTEO BI-FLEX TRIPLE STRENGTH) TABS Take 1 tablet by mouth 2 (two) times daily.    . Multiple Vitamin (MULTIVITAMIN WITH MINERALS) TABS tablet Take 1 tablet by mouth daily.    . Multiple Vitamins-Minerals (CENTRUM SILVER PO) Take 1 tablet by mouth daily.    . nitroGLYCERIN (NITROSTAT) 0.4 MG SL tablet Place 1 tablet (0.4 mg total) under the tongue every 5 (five) minutes x 3 doses as needed for chest pain. 25 tablet 4  . oxybutynin (DITROPAN) 5 MG tablet Take 5 mg by mouth daily.     . pantoprazole (PROTONIX) 40 MG tablet Take 1 tablet (40 mg total) by mouth daily. 30 tablet 1  . polyethylene glycol (MIRALAX / GLYCOLAX) packet Take 17 g by mouth 2 (two) times daily.    . prochlorperazine (COMPAZINE) 10 MG tablet Take 1 tablet (10 mg total) by mouth every 6 (six) hours as needed for nausea or vomiting. 30 tablet 0  . Sennosides (EX-LAX PO) Take 2 tablets by mouth daily as needed (constipation).     No current facility-administered medications for this visit.     SURGICAL HISTORY:  Past Surgical History:  Procedure Laterality Date  . APPENDECTOMY     "busted on me"  . COLONOSCOPY    . CORONARY ANGIOPLASTY  1997   CFX  . ENDARTERECTOMY FEMORAL Right 05/13/2016   iliofemoral endarterectomy with bovine pericardial patch angioplasty  . ENDARTERECTOMY FEMORAL Right 05/13/2016   Procedure: RIGHT ENDARTERECTOMY FEMORAL;  Surgeon: Serafina Mitchell, MD;  Location: [facility];  Service: Vascular;  Laterality: Right;  . PATCH ANGIOPLASTY Right 05/13/2016   Procedure: [place];  Surgeon: Serafina Mitchell, MD;  Location: [facility];  Service: Vascular;  Laterality: Right;  . PERIPHERAL VASCULAR CATHETERIZATION N/A 02/20/2016   Procedure: Lower Extremity Angiography;  Surgeon: Lorretta Harp, MD;  Location: [facility];  Service: Cardiovascular;  Laterality: N/A;    REVIEW OF SYSTEMS:  Constitutional: positive for anorexia, fatigue and weight loss Eyes: negative Ears, nose, mouth, throat, and face: negative Respiratory: positive for cough and dyspnea on exertion Cardiovascular: negative Gastrointestinal: negative Genitourinary:positive for Right flank pain from kidney stone Integument/breast: negative Hematologic/lymphatic: negative Musculoskeletal:positive for  muscle weakness Neurological: negative Behavioral/Psych: negative Endocrine: negative Allergic/Immunologic: negative   PHYSICAL EXAMINATION: General appearance: alert, cooperative, fatigued and no distress Head: Normocephalic, without obvious abnormality, atraumatic Neck: no adenopathy, no JVD, supple, symmetrical, trachea midline and thyroid not enlarged, symmetric, no tenderness/mass/nodules Lymph nodes: Cervical, supraclavicular, and axillary nodes normal. Resp: rales RUL and wheezes RUL Back: symmetric, no curvature. ROM normal. No CVA tenderness. Cardio: regular rate and rhythm, S1, S2 normal, no murmur, click, rub or gallop GI: soft, non-tender; bowel sounds normal; no masses,  no organomegaly Extremities: extremities normal, atraumatic, no cyanosis or edema Neurologic: Alert and oriented X 3, normal strength and tone. Normal symmetric reflexes. Normal coordination and gait  ECOG PERFORMANCE STATUS: 1 - Symptomatic but completely ambulatory  Blood pressure 117/69, pulse 86, temperature 97.8 F (36.6 C), temperature source Oral, resp. rate 18, height 5\' 8"  (1.727 m), weight 164 lb 3.2 oz (74.5 kg), SpO2 95 %.  LABORATORY DATA: Lab Results  Component  Value Date   WBC 1.1 (L) 04/05/2018   HGB 8.9 (L) 04/05/2018   HCT 26.6 (L) 04/05/2018   MCV 87.8 04/05/2018   PLT 96 (L) 04/05/2018      Chemistry      Component Value Date/Time   NA 133 (L) 03/30/2018 0829   K 5.3 (H) 03/30/2018 0829   CL 95 (L) 03/30/2018 0829   CO2 24 03/30/2018 0829   BUN 31 (H) 03/30/2018 0829   CREATININE 1.30 (H) 03/30/2018 0829   CREATININE 1.54 (H) 03/28/2018 0942   CREATININE 1.46 (H) 02/27/2016 0820      Component Value Date/Time   CALCIUM 9.5 03/30/2018 0829   ALKPHOS 64 03/28/2018 0942   AST 30 03/28/2018 0942   ALT 13 03/28/2018 0942   BILITOT 0.6 03/28/2018 0942       RADIOGRAPHIC STUDIES: Dg Chest 2 View  Result Date: 03/08/2018 CLINICAL DATA:  Shortness of breath began after beginning pain medicine for kidney stones. The patient reports increase shortness of breath when walking. Former smoker. History of coronary artery disease. EXAM: CHEST - 2 VIEW COMPARISON:  PA and lateral chest x-ray of February 25, 2018 FINDINGS: The left lung is well-expanded and clear. On the right there is persistent increased density in the inferior aspect of the upper lobe. There is new thickening of the minor fissure and the previously demonstrated trace right pleural effusion has increased in volume. The cardiac silhouette is mildly enlarged. The pulmonary vascularity is normal. There is calcification in the wall of the thoracic aorta. The bony thorax exhibits no acute abnormality. IMPRESSION: Worsening of the small right pleural effusion. Persistent airspace opacity inferiorly in the right upper lobe slightly more conspicuous than on the February 25, 2018 study. This likely reflects pneumonia. A central obstructing lesion is not excluded however. Chest CT scanning is recommended to exclude occult malignancy. Thoracic aortic atherosclerosis. Electronically Signed   By: David  Martinique M.D.   On: 03/08/2018 13:09   Ct Chest Wo Contrast  Result Date: 03/08/2018 CLINICAL DATA:   Shortness of breath for 3 weeks. CT recommended after abnormal chest x-ray. EXAM: CT CHEST WITHOUT CONTRAST TECHNIQUE: Multidetector CT imaging of the chest was performed following the standard protocol without IV contrast. COMPARISON:  Chest x-ray on 03/08/2018 FINDINGS: Cardiovascular: The heart is enlarged. There is a 1.0 centimeter pericardial effusion. There is dense atherosclerotic calcification of the coronary vessels. The thoracic aorta is also partially calcified, with mildly aneurysmal ascending of the aorta measuring 4.0 centimeters. Mediastinum/Nodes: The thyroid is  mildly heterogeneous. There is extensive mediastinal and hilar adenopathy. Enlarged nodes are identified in the LOWER neck bilaterally and in the supraclavicular regions, only partially evaluated. Numerous bulky lymph nodes are identified in the superior mediastinum and RIGHT paratracheal region, largest measuring 2.2 centimeters. Prevascular lymph node is 4.7 x 2.8 centimeters. Precarinal lymph node mass is 3.0 centimeters. Subcarinal mass is 2.3 centimeters. Enlarged RIGHT hilar lymph nodes are identified, associated with narrowing of the RIGHT central airways and soft tissue mass in the RIGHT hilum extends into the RIGHT UPPER lobe, described below. The esophagus is normal in appearance. Lungs/Pleura: Within the RIGHT UPPER lobe, there is nodular mass measuring approximately 5.0 x 2.8 centimeters. Mass is contiguous with RIGHT hilar adenopathy. There is soft tissue in the region of the RIGHT middle lobe, measuring 2.3 x 5.9 centimeters and also contiguous with the hilar adenopathy. There is thickening of the RIGHT minor and major fissures. RIGHT pleural effusion is present. LEFT lung is clear. Upper Abdomen: LEFT adrenal mass is 1.9 centimeters an indeterminate for benign lesion based on Hounsfield units. Numerous nodules are identified in the LEFT UPPER QUADRANT retroperitoneum, largest 1.8 centimeters. There is a mass extending off the  UPPER pole of each kidney, not further characterized on this noncontrast exam. On the RIGHT, mass is 1.8 centimeters. On the LEFT, masses 2.1 centimeters. Partially imaged retroperitoneal lymphadenopathy. Numerous layering gallstones are present. Musculoskeletal: There is a comminuted fracture of the RIGHT scapula. There are acute fracture fragments and probable callus, consistent with a subacute injury. In the mid body of the scapula, there is question of a lytic lesion. IMPRESSION: 1. RIGHT UPPER lobe mass measuring 5.0 centimeters and suspicious for bronchogenic carcinoma. 2. Bulky adenopathy in the superior mediastinum, LOWER neck, supraclavicular region, mediastinum, and RIGHT hilar regions. 3. RIGHT pleural effusion and pleural thickening. 4. UPPER abdominal adenopathy/masses. Recommend further evaluation with CT of the abdomen and pelvis. Intravenous contrast is recommended unless contraindicated. Recommend renal protocol if possible. 5. Pericardial effusion. 6. LEFT adrenal mass 1.9 centimeters. 7. Suspect subacute fracture of the RIGHT scapula which may be a pathologic fracture. 8. Bilateral renal masses are indeterminate. 9. Cholelithiasis. 10.  Aortic Atherosclerosis (ICD10-I70.0). 11. Aortic aneurysm NOS (ICD10-I71.9). Ascending aorta is 4.0 centimeters. Recommend annual imaging followup by CTA or MRA. This recommendation follows 2010 ACCF/AHA/AATS/ACR/ASA/SCA/SCAI/SIR/STS/SVM Guidelines for the Diagnosis and Management of Patients with Thoracic Aortic Disease. Circulation. 2010; 121: E938-B017 Electronically Signed   By: Nolon Nations M.D.   On: 03/08/2018 19:55   Mr Jeri Cos PZ Contrast  Result Date: 03/31/2018 CLINICAL DATA:  Small-cell lung cancer staging. EXAM: MRI HEAD WITHOUT AND WITH CONTRAST TECHNIQUE: Multiplanar, multiecho pulse sequences of the brain and surrounding structures were obtained without and with intravenous contrast. CONTRAST:  7 mL Gadavist COMPARISON:  02/10/2017 FINDINGS:  Brain: There is no evidence of acute infarct, intracranial hemorrhage, mass, midline shift, or extra-axial fluid collection. There is mild to moderate cerebral atrophy. Chronic lacunar infarcts are present in the corona radiata bilaterally, left basal ganglia, left periatrial white matter, and right pons, unchanged. Additional patchy T2 hyperintensities in the cerebral white matter and pons are nonspecific but compatible with moderate chronic small vessel ischemic disease, progressed in the pons from the prior study with a new chronic lacunar infarct on the left. No abnormal enhancement is identified. Vascular: Major intracranial vascular flow voids are preserved. Skull and upper cervical spine: No suspicious calvarial lesion. Extensive marrow abnormality with heterogeneous enhancement involving the C2 vertebral body and portions of  the dens and posterior elements, new from the prior study. Suspected new marrow abnormality in the included superior portion of the C3 vertebral body is well. Sinuses/Orbits: Unremarkable orbits. Minimal scattered paranasal sinus mucosal thickening. Clear mastoid air cells. Other: None. IMPRESSION: 1. No evidence of intracranial metastases. 2. Bone metastases in the upper cervical spine. 3. Mild-to-moderate chronic small vessel ischemic disease and cerebral atrophy. Electronically Signed   By: Logan Bores M.D.   On: 03/31/2018 16:46   Nm Pet Image Initial (pi) Skull Base To Thigh  Result Date: 03/31/2018 CLINICAL DATA:  Initial treatment strategy for small cell lung cancer. EXAM: NUCLEAR MEDICINE PET SKULL BASE TO THIGH TECHNIQUE: 8.5 mCi F-18 FDG was injected intravenously. Full-ring PET imaging was performed from the skull base to thigh after the radiotracer. CT data was obtained and used for attenuation correction and anatomic localization. Fasting blood glucose: 131 mg/dl COMPARISON:  CT chest 03/08/2018 and CT abdomen pelvis 02/25/2018. FINDINGS: Mediastinal blood pool  activity: SUV max 2.5 NECK: There are bulky hypermetabolic lymph nodes in low internal jugular stations bilaterally, measuring up to 2.5 cm on the right, with an SUV max 7 8. Incidental CT findings: None. CHEST: Bulky hypermetabolic mediastinal and right hilar adenopathy. Index low right paratracheal lymph node measures 2.9 cm with an SUV max of 8 4. Impacted bronchi and soft tissue in the right perihilar region have an SUV max of 6.5. Extrapleural soft tissue in the medial mid left hemithorax measures approximately 3.3 cm (CT image 83) with an SUV max 6.3. Incidental CT findings: Atherosclerotic calcification of the arterial vasculature, including extensive three-vessel involvement of the coronary arteries. Heart is enlarged. Small pericardial effusion. Small bilateral pleural effusions, right greater than left. ABDOMEN/PELVIS: Bilateral hypermetabolic adrenal metastases measure up to 2.5 cm on the left with an SUV max of 5.5. There are numerous soft tissue nodules in the abdomen, which are predominantly retroperitoneal in location. Index left perirenal nodule measures 2.7 cm (CT image 119) with an SUV max of 4.6. No abnormal hypermetabolism in the liver, pancreas or spleen. Incidental CT findings: Liver is unremarkable. Numerous stones are seen in the gallbladder. Kidneys, spleen, pancreas, stomach and bowel are grossly unremarkable. Atherosclerotic calcification of the arterial vasculature. Prostate is nodular. SKELETON: There are hypermetabolic osseous metastases. Index lesion in the right scapula has an associated pathologic fracture, with an SUV max 5.4. There are scattered subcutaneous hypermetabolic nodules. Index nodule overlying the upper thoracic spine measures 1.6 cm (CT image 53) with an SUV max of 4.4. Incidental CT findings: Degenerative changes in the spine. IMPRESSION: 1. Widespread metastatic small-cell lung cancer involving low neck/intrathoracic lymph nodes, right upper lobe, left extrapleural  space, adrenal glands, retroperitoneum, subcutaneous nodules and bones. 2. Pathologic right scapular fracture. 3. 4. Aortic atherosclerosis (ICD10-170.0). Extensive 3 vessel coronary artery calcification. 5. Cholelithiasis. 6. Nodular prostate. Electronically Signed   By: Lorin Picket M.D.   On: 03/31/2018 15:41   US Biopsy (abdominal Retropertioneal Mass)  Result Date: 03/10/2018 INDICATION: 77 year old with evidence for metastatic disease and needs a tissue diagnosis. Patient is extensive cervical lymphadenopathy, chest lymphadenopathy, right lung lesion and concern for abdominal metastatic disease. EXAM: ULTRASOUND-GUIDED CORE BIOPSY OF RIGHT CERVICAL LYMPH NODES MEDICATIONS: None. ANESTHESIA/SEDATION: Moderate (conscious) sedation was employed during this procedure. A total of Versed 1 mg and Fentanyl 25 mcg was administered intravenously. Moderate Sedation Time: 13 minutes. The patient's level of consciousness and vital signs were monitored continuously by radiology nursing throughout the procedure under my direct supervision. FLUOROSCOPY  TIME:  None COMPLICATIONS: None immediate. PROCEDURE: Informed written consent was obtained from the patient after a thorough discussion of the procedural risks, benefits and alternatives. All questions were addressed. A timeout was performed prior to the initiation of the procedure. Both sides of the neck were evaluated with ultrasound. Patient has enlarged abnormal lymph nodes on both sides of the neck. Lymphadenopathy appears to be greater on the right side where the patient actually has a palpable lesion. A cluster of abnormal lymph nodes was targeted for biopsy. The right side of the neck was prepped with chlorhexidine and sterile field was created. Skin was anesthetized with 1% lidocaine. Using ultrasound guidance, 18 gauge core needle was directed into this cluster of abnormal lymph nodes. Total of 5 core biopsies were obtained. Specimens placed in saline.  Bandage placed over the puncture site. FINDINGS: Cluster of abnormal lymph nodes in the right lower neck. In addition, there are abnormal large lymph nodes on the left side of the neck. Core biopsy needle confirmed within the abnormal lymph nodes. No evidence for hematoma formation or bleeding following the core biopsies. IMPRESSION: Successful ultrasound-guided core biopsies of abnormal right cervical lymph nodes. Electronically Signed   By: Markus Daft M.D.   On: 03/10/2018 16:07    ASSESSMENT AND PLAN: This is a very pleasant 77 years old white male recently diagnosed with extensive stage (T2b, N3, M1c) small cell lung cancer presented with right upper lobe lung mass in addition to right mediastinal and supraclavicular lymphadenopathy as well as metastatic disease to the retroperitoneal lymph nodes, left adrenal gland as well as bone disease in the right scapula diagnosed in August 2019. The patient completed the staging work-up with a PET scan and MRI of the brain.  His MRI of the brain showed no concerning evidence for metastatic disease to the brain but the PET scan showed extensive disease involving the lung, lower neck/intrathoracic lymph nodes, right upper lobe, left extrapleural space, adrenal glands, retroperitoneum as well as subcutaneous nodules and metastatic bone disease and pathologic fracture of the right scapular bone. I personally and independently reviewed the scan images and discussed the result and showed the images to the patient and his son-in-law today. I recommended for the patient to continue his current treatment with carboplatin, etoposide and Tecentriq and he is expected to start cycle #2 in 2 weeks. For the leukocytopenia and neutropenia, I strongly advised the patient to take neutropenic precaution and to call or go to the emergency department immediately if he develop any fever or chills. For the right flank pain from the kidney stone, he will need follow-up with his primary  care physician or urologist for management of this condition. The patient has multiple social issues and we will arrange for him to see the social worker at the cancer center today to address his issues. I also recommended for the patient to see his primary care physician as soon as possible for management of his diabetes as well as hypertension. He was advised to call immediately if he has any concerning symptoms in the interval. The patient voices understanding of current disease status and treatment options and is in agreement with the current care plan.  All questions were answered. The patient knows to call the clinic with any problems, questions or concerns. We can certainly see the patient much sooner if necessary.  I spent 15 minutes counseling the patient face to face. The total time spent in the appointment was 25 minutes.  Disclaimer: This note  was dictated with voice recognition software. Similar sounding words can inadvertently be transcribed and may not be corrected upon review.

## 2018-04-05 NOTE — Telephone Encounter (Signed)
Metoprolol Succ.  and Imdur orders clarified with Dr Kennon Holter office. Pt instructed to pick u new rx for Metoprolol succinate and Imdur. Son requests RN evaluation and for medication management.

## 2018-04-08 ENCOUNTER — Encounter: Payer: Self-pay | Admitting: Internal Medicine

## 2018-04-08 ENCOUNTER — Other Ambulatory Visit: Payer: Self-pay | Admitting: Cardiovascular Disease

## 2018-04-08 NOTE — Progress Notes (Signed)
Attempted to call pt to introduce myself as his Arboriculturist but was unable to leave a msg on his cell or home phone.  Unfortunately there aren't any foundations offering copay assistance for his Dx and the type of ins he has but I will plan to meet him on 04/11/18 to discuss the [place].

## 2018-04-11 ENCOUNTER — Inpatient Hospital Stay: Payer: Medicare Other

## 2018-04-11 DIAGNOSIS — C3411 Malignant neoplasm of upper lobe, right bronchus or lung: Secondary | ICD-10-CM | POA: Diagnosis not present

## 2018-04-11 DIAGNOSIS — C3491 Malignant neoplasm of unspecified part of right bronchus or lung: Secondary | ICD-10-CM

## 2018-04-11 LAB — CMP (CANCER CENTER ONLY)
ALK PHOS: 105 U/L (ref 38–126)
ALT: 11 U/L (ref 0–44)
AST: 19 U/L (ref 15–41)
Albumin: 2.9 g/dL — ABNORMAL LOW (ref 3.5–5.0)
Anion gap: 7 (ref 5–15)
BUN: 12 mg/dL (ref 8–23)
CALCIUM: 9.2 mg/dL (ref 8.9–10.3)
CO2: 28 mmol/L (ref 22–32)
CREATININE: 1.36 mg/dL — AB (ref 0.61–1.24)
Chloride: 98 mmol/L (ref 98–111)
GFR, EST AFRICAN AMERICAN: 55 mL/min — AB (ref >60)
GFR, Estimated: 48 mL/min — ABNORMAL LOW (ref >60)
GLUCOSE: 137 mg/dL — AB (ref 70–99)
Potassium: 4.6 mmol/L (ref 3.5–5.1)
SODIUM: 133 mmol/L — AB (ref 135–145)
Total Bilirubin: 0.5 mg/dL (ref 0.3–1.2)
Total Protein: 6.1 g/dL — ABNORMAL LOW (ref 6.5–8.1)

## 2018-04-11 LAB — CBC WITH DIFFERENTIAL (CANCER CENTER ONLY)
Basophils Absolute: 0.1 10*3/uL (ref 0.0–0.1)
Basophils Relative: 0 %
EOS ABS: 0 10*3/uL (ref 0.0–0.5)
Eosinophils Relative: 0 %
HEMATOCRIT: 25.5 % — AB (ref 38.4–49.9)
HEMOGLOBIN: 8.6 g/dL — AB (ref 13.0–17.1)
LYMPHS ABS: 2.3 10*3/uL (ref 0.9–3.3)
LYMPHS PCT: 13 %
MCH: 29.8 pg (ref 27.2–33.4)
MCHC: 33.7 g/dL (ref 32.0–36.0)
MCV: 88.2 fL (ref 79.3–98.0)
Monocytes Absolute: 1.8 10*3/uL — ABNORMAL HIGH (ref 0.1–0.9)
Monocytes Relative: 10 %
NEUTROS ABS: 13.8 10*3/uL — AB (ref 1.5–6.5)
NEUTROS PCT: 77 %
Platelet Count: 126 10*3/uL — ABNORMAL LOW (ref 140–400)
RBC: 2.89 MIL/uL — AB (ref 4.20–5.82)
RDW: 13.9 % (ref 11.0–14.6)
WBC Count: 17.9 10*3/uL — ABNORMAL HIGH (ref 4.0–10.3)

## 2018-04-12 MED ORDER — GENTAMICIN SULFATE 40 MG/ML IJ SOLN
5.0000 mg/kg | INTRAVENOUS | Status: AC
  Administered 2018-04-13: 372.4 mg via INTRAVENOUS
  Filled 2018-04-12: qty 9.25

## 2018-04-13 ENCOUNTER — Ambulatory Visit (HOSPITAL_COMMUNITY)
Admission: RE | Admit: 2018-04-13 | Discharge: 2018-04-13 | Disposition: A | Discharge status: Home / Self Care | Payer: Medicare Other | Source: Ambulatory Visit | Attending: Urology | Admitting: Urology

## 2018-04-13 ENCOUNTER — Ambulatory Visit (HOSPITAL_COMMUNITY): Payer: Medicare Other

## 2018-04-13 ENCOUNTER — Ambulatory Visit (HOSPITAL_COMMUNITY): Payer: Medicare Other | Admitting: Anesthesiology

## 2018-04-13 ENCOUNTER — Encounter (HOSPITAL_COMMUNITY)
Admission: RE | Disposition: A | Discharge status: Home / Self Care | Payer: Self-pay | Source: Ambulatory Visit | Attending: Urology

## 2018-04-13 ENCOUNTER — Encounter (HOSPITAL_COMMUNITY): Payer: Self-pay

## 2018-04-13 DIAGNOSIS — N183 Chronic kidney disease, stage 3 (moderate): Secondary | ICD-10-CM | POA: Diagnosis not present

## 2018-04-13 DIAGNOSIS — M1990 Unspecified osteoarthritis, unspecified site: Secondary | ICD-10-CM | POA: Insufficient documentation

## 2018-04-13 DIAGNOSIS — I129 Hypertensive chronic kidney disease with stage 1 through stage 4 chronic kidney disease, or unspecified chronic kidney disease: Secondary | ICD-10-CM | POA: Diagnosis not present

## 2018-04-13 DIAGNOSIS — C785 Secondary malignant neoplasm of large intestine and rectum: Secondary | ICD-10-CM | POA: Insufficient documentation

## 2018-04-13 DIAGNOSIS — D696 Thrombocytopenia, unspecified: Secondary | ICD-10-CM | POA: Diagnosis not present

## 2018-04-13 DIAGNOSIS — I712 Thoracic aortic aneurysm, without rupture: Secondary | ICD-10-CM | POA: Diagnosis not present

## 2018-04-13 DIAGNOSIS — Z87891 Personal history of nicotine dependence: Secondary | ICD-10-CM | POA: Insufficient documentation

## 2018-04-13 DIAGNOSIS — Z79899 Other long term (current) drug therapy: Secondary | ICD-10-CM | POA: Diagnosis not present

## 2018-04-13 DIAGNOSIS — I252 Old myocardial infarction: Secondary | ICD-10-CM | POA: Diagnosis not present

## 2018-04-13 DIAGNOSIS — C3490 Malignant neoplasm of unspecified part of unspecified bronchus or lung: Secondary | ICD-10-CM | POA: Diagnosis not present

## 2018-04-13 DIAGNOSIS — E1140 Type 2 diabetes mellitus with diabetic neuropathy, unspecified: Secondary | ICD-10-CM | POA: Diagnosis not present

## 2018-04-13 DIAGNOSIS — Z885 Allergy status to narcotic agent status: Secondary | ICD-10-CM | POA: Insufficient documentation

## 2018-04-13 DIAGNOSIS — I69354 Hemiplegia and hemiparesis following cerebral infarction affecting left non-dominant side: Secondary | ICD-10-CM | POA: Insufficient documentation

## 2018-04-13 DIAGNOSIS — K219 Gastro-esophageal reflux disease without esophagitis: Secondary | ICD-10-CM | POA: Diagnosis not present

## 2018-04-13 DIAGNOSIS — Z7902 Long term (current) use of antithrombotics/antiplatelets: Secondary | ICD-10-CM | POA: Insufficient documentation

## 2018-04-13 DIAGNOSIS — E1151 Type 2 diabetes mellitus with diabetic peripheral angiopathy without gangrene: Secondary | ICD-10-CM | POA: Insufficient documentation

## 2018-04-13 DIAGNOSIS — N201 Calculus of ureter: Secondary | ICD-10-CM | POA: Insufficient documentation

## 2018-04-13 DIAGNOSIS — I2510 Atherosclerotic heart disease of native coronary artery without angina pectoris: Secondary | ICD-10-CM | POA: Insufficient documentation

## 2018-04-13 DIAGNOSIS — Z794 Long term (current) use of insulin: Secondary | ICD-10-CM | POA: Diagnosis not present

## 2018-04-13 DIAGNOSIS — Z7982 Long term (current) use of aspirin: Secondary | ICD-10-CM | POA: Insufficient documentation

## 2018-04-13 DIAGNOSIS — E785 Hyperlipidemia, unspecified: Secondary | ICD-10-CM | POA: Insufficient documentation

## 2018-04-13 DIAGNOSIS — Z8249 Family history of ischemic heart disease and other diseases of the circulatory system: Secondary | ICD-10-CM | POA: Insufficient documentation

## 2018-04-13 DIAGNOSIS — E1122 Type 2 diabetes mellitus with diabetic chronic kidney disease: Secondary | ICD-10-CM | POA: Diagnosis not present

## 2018-04-13 HISTORY — PX: CYSTOSCOPY WITH RETROGRADE PYELOGRAM, URETEROSCOPY AND STENT PLACEMENT: SHX5789

## 2018-04-13 HISTORY — PX: HOLMIUM LASER APPLICATION: SHX5852

## 2018-04-13 LAB — GLUCOSE, CAPILLARY
GLUCOSE-CAPILLARY: 131 mg/dL — AB (ref 70–99)
GLUCOSE-CAPILLARY: 131 mg/dL — AB (ref 70–99)

## 2018-04-13 SURGERY — CYSTOURETEROSCOPY, WITH RETROGRADE PYELOGRAM AND STENT INSERTION
Anesthesia: General | Laterality: Right

## 2018-04-13 MED ORDER — ONDANSETRON HCL 4 MG/2ML IJ SOLN: 4.0000 mg | Freq: Once | INTRAMUSCULAR | Status: DC | PRN

## 2018-04-13 MED ORDER — LIDOCAINE 2% (20 MG/ML) 5 ML SYRINGE
INTRAMUSCULAR | Status: DC | PRN
  Administered 2018-04-13: 80 mg via INTRAVENOUS

## 2018-04-13 MED ORDER — ETOMIDATE 2 MG/ML IV SOLN
INTRAVENOUS | Status: DC | PRN
  Administered 2018-04-13: 18 mg via INTRAVENOUS

## 2018-04-13 MED ORDER — FENTANYL CITRATE (PF) 100 MCG/2ML IJ SOLN
INTRAMUSCULAR | Status: AC
  Filled 2018-04-13: qty 2

## 2018-04-13 MED ORDER — CEPHALEXIN 500 MG PO CAPS: 500.0000 mg | ORAL_CAPSULE | Freq: Two times a day (BID) | ORAL | 0 refills | Status: DC

## 2018-04-13 MED ORDER — TRAMADOL HCL 50 MG PO TABS
50.0000 mg | ORAL_TABLET | Freq: Four times a day (QID) | ORAL | Status: AC | PRN
  Administered 2018-04-13: 50 mg via ORAL

## 2018-04-13 MED ORDER — FENTANYL CITRATE (PF) 100 MCG/2ML IJ SOLN
INTRAMUSCULAR | Status: DC | PRN
  Administered 2018-04-13: 25 ug via INTRAVENOUS
  Administered 2018-04-13: 50 ug via INTRAVENOUS
  Administered 2018-04-13 (×2): 25 ug via INTRAVENOUS
  Administered 2018-04-13: 50 ug via INTRAVENOUS
  Administered 2018-04-13: 25 ug via INTRAVENOUS

## 2018-04-13 MED ORDER — 0.9 % SODIUM CHLORIDE (POUR BTL) OPTIME
TOPICAL | Status: DC | PRN
  Administered 2018-04-13: 1000 mL

## 2018-04-13 MED ORDER — IOHEXOL 300 MG/ML  SOLN
INTRAMUSCULAR | Status: DC | PRN
  Administered 2018-04-13: 10 mL

## 2018-04-13 MED ORDER — LIDOCAINE 2% (20 MG/ML) 5 ML SYRINGE
INTRAMUSCULAR | Status: AC
  Filled 2018-04-13: qty 5

## 2018-04-13 MED ORDER — SODIUM CHLORIDE 0.9 % IV SOLN
INTRAVENOUS | Status: DC | PRN
  Administered 2018-04-13: 50 ug/min via INTRAVENOUS

## 2018-04-13 MED ORDER — FENTANYL CITRATE (PF) 100 MCG/2ML IJ SOLN: 25.0000 ug | INTRAMUSCULAR | Status: DC | PRN

## 2018-04-13 MED ORDER — PROPOFOL 10 MG/ML IV BOLUS
INTRAVENOUS | Status: AC
  Filled 2018-04-13: qty 20

## 2018-04-13 MED ORDER — ONDANSETRON HCL 4 MG/2ML IJ SOLN
INTRAMUSCULAR | Status: AC
  Filled 2018-04-13: qty 2

## 2018-04-13 MED ORDER — TRAMADOL HCL 50 MG PO TABS
ORAL_TABLET | ORAL | Status: AC
  Filled 2018-04-13: qty 1

## 2018-04-13 MED ORDER — PROMETHAZINE HCL 25 MG/ML IJ SOLN
6.2500 mg | Freq: Once | INTRAMUSCULAR | Status: AC
  Administered 2018-04-13: 6.25 mg via INTRAVENOUS

## 2018-04-13 MED ORDER — PROMETHAZINE HCL 25 MG/ML IJ SOLN
INTRAMUSCULAR | Status: AC
  Filled 2018-04-13: qty 1

## 2018-04-13 MED ORDER — SENNOSIDES-DOCUSATE SODIUM 8.6-50 MG PO TABS: 1.0000 | ORAL_TABLET | Freq: Two times a day (BID) | ORAL | 0 refills | Status: AC

## 2018-04-13 MED ORDER — SODIUM CHLORIDE 0.9 % IR SOLN
Status: DC | PRN
  Administered 2018-04-13: 1000 mL

## 2018-04-13 MED ORDER — TRAMADOL HCL 50 MG PO TABS: 50.0000 mg | ORAL_TABLET | Freq: Four times a day (QID) | ORAL | 0 refills | Status: DC | PRN

## 2018-04-13 MED ORDER — ONDANSETRON HCL 4 MG/2ML IJ SOLN
INTRAMUSCULAR | Status: DC | PRN
  Administered 2018-04-13: 4 mg via INTRAVENOUS

## 2018-04-13 MED ORDER — LACTATED RINGERS IV SOLN
INTRAVENOUS | Status: DC
  Administered 2018-04-13 (×2): 1000 mL via INTRAVENOUS

## 2018-04-13 SURGICAL SUPPLY — 25 items
BAG URO CATCHER STRL LF (MISCELLANEOUS) ×3 IMPLANT
BASKET LASER NITINOL 1.9FR (BASKET) ×3 IMPLANT
CATH INTERMIT  6FR 70CM (CATHETERS) ×3 IMPLANT
CLOTH BEACON ORANGE TIMEOUT ST (SAFETY) ×3 IMPLANT
COVER SURGICAL LIGHT HANDLE (MISCELLANEOUS) IMPLANT
EXTRACTOR STONE 1.7FRX115CM (UROLOGICAL SUPPLIES) IMPLANT
FIBER LASER FLEXIVA 1000 (UROLOGICAL SUPPLIES) IMPLANT
FIBER LASER FLEXIVA 365 (UROLOGICAL SUPPLIES) IMPLANT
FIBER LASER FLEXIVA 550 (UROLOGICAL SUPPLIES) IMPLANT
FIBER LASER TRAC TIP (UROLOGICAL SUPPLIES) ×3 IMPLANT
GLOVE BIOGEL M STRL SZ7.5 (GLOVE) ×3 IMPLANT
GOWN STRL REUS W/TWL LRG LVL3 (GOWN DISPOSABLE) ×6 IMPLANT
GUIDEWIRE ANG ZIPWIRE 038X150 (WIRE) ×3 IMPLANT
GUIDEWIRE STR DUAL SENSOR (WIRE) ×3 IMPLANT
IV NS 1000ML (IV SOLUTION) ×2
IV NS 1000ML BAXH (IV SOLUTION) ×1 IMPLANT
MANIFOLD NEPTUNE II (INSTRUMENTS) ×3 IMPLANT
PACK CYSTO (CUSTOM PROCEDURE TRAY) ×3 IMPLANT
SHEATH URETERAL 12FRX28CM (UROLOGICAL SUPPLIES) IMPLANT
SHEATH URETERAL 12FRX35CM (MISCELLANEOUS) IMPLANT
STENT POLARIS 5FRX26 (STENTS) ×3 IMPLANT
SYR CONTROL 10ML LL (SYRINGE) ×3 IMPLANT
TUBE FEEDING 8FR 16IN STR KANG (MISCELLANEOUS) ×3 IMPLANT
TUBING CONNECTING 10 (TUBING) ×2 IMPLANT
TUBING CONNECTING 10' (TUBING) ×1

## 2018-04-13 NOTE — Anesthesia Procedure Notes (Signed)
Procedure Name: LMA Insertion Date/Time: 04/13/2018 1:30 PM Performed by: [place] Nataly Pacifico C, CRNA Pre-anesthesia Checklist: Patient identified, Emergency Drugs available, Suction available and Patient being monitored Patient Re-evaluated:Patient Re-evaluated prior to induction Oxygen Delivery Method: Circle system utilized Preoxygenation: Pre-oxygenation with 100% oxygen Induction Type: IV induction Ventilation: Mask ventilation without difficulty LMA: LMA inserted LMA Size: 4.0 Number of attempts: 1 Airway Equipment and Method: Bite block Placement Confirmation: positive ETCO2 Tube secured with: Tape Dental Injury: Teeth and Oropharynx as per pre-operative assessment and Injury to lip  Comments: Chipped, sharp and jagged teeth causing injury to lip during lma insertion

## 2018-04-13 NOTE — Anesthesia Preprocedure Evaluation (Addendum)
Anesthesia Evaluation  Patient identified by MRN, date of birth, ID band Patient awake    Reviewed: Allergy & Precautions, NPO status , Patient's Chart, lab work & pertinent test results, reviewed documented beta blocker date and time   Airway Mallampati: II  TM Distance: >3 FB Neck ROM: Full    Dental  (+) Dental Advisory Given, Chipped, Missing, Poor Dentition   Pulmonary former smoker,  Metastatic lung cancer   Pulmonary exam normal breath sounds clear to auscultation       Cardiovascular hypertension, Pt. on medications and Pt. on home beta blockers (-) angina+ CAD (s/p PCI), + Past MI and + Peripheral Vascular Disease  Normal cardiovascular exam Rhythm:Regular Rate:Normal  Echo 03/09/18: Study Conclusions  - Left ventricle: The cavity size was normal. Systolic function was moderately reduced. The estimated ejection fraction was in the range of 35% to 40%. Diffuse hypokinesis. There is akinesis of the inferior and inferoseptal myocardium. Doppler parameters are   consistent with abnormal left ventricular relaxation (grade 1 diastolic dysfunction). - Aortic valve: Trileaflet; mildly thickened, mildly calcified   leaflets. - Pericardium, extracardiac: A small pericardial effusion was identified circumferential to the heart. There was no evidence of hemodynamic compromise.  Impressions:  - EF is decreased when compared to prior.   Neuro/Psych CVA, No Residual Symptoms negative psych ROS   GI/Hepatic Neg liver ROS, GERD  Medicated,  Endo/Other  diabetes, Type 2, Insulin Dependent  Renal/GU Renal InsufficiencyRenal disease     Musculoskeletal  (+) Arthritis , Osteoarthritis,    Abdominal   Peds  Hematology  (+) Blood dyscrasia (Thrombocytopenia; Plavix), ,   Anesthesia Other Findings Day of surgery medications reviewed with the patient.  Reproductive/Obstetrics                            Anesthesia Physical Anesthesia Plan  ASA: IV  Anesthesia Plan: General   Post-op Pain Management:    Induction: Intravenous  PONV Risk Score and Plan: 3 and Ondansetron and Treatment may vary due to age or medical condition  Airway Management Planned: LMA  Additional Equipment:   Intra-op Plan:   Post-operative Plan: Extubation in OR  Informed Consent: I have reviewed the patients History and Physical, chart, labs and discussed the procedure including the risks, benefits and alternatives for the proposed anesthesia with the patient or authorized representative who has indicated his/her understanding and acceptance.   Dental advisory given  Plan Discussed with: CRNA  Anesthesia Plan Comments:        Anesthesia Quick Evaluation

## 2018-04-13 NOTE — H&P (Signed)
Arthur [name] is an 77 y.o. male.    Chief Complaint: Pre-op RIGHT Ureteroscopic Stone Manipulation  HPI:   1 - Lower Urinary Tract Symptoms - Pt with slowly progressive irritative and obstructive symptoms with some weak stream, and progressive urinary urgency. NO retention. DRE 2018 60gm smooth. PVR 2018 "171mL". PSA 1.5 2016. Has been on Doxazosin x years per PCP, and PRN oxybutynin added 2018. No gross hematuria. He is diabetic with neuropathy and A1C >9. Prostate Vol 152mL with large median lobe by CT 2019. Did not tollerate finasteride.   2 - Chronic Kidney Disease Stage 3 - Long h/o IDDM, HTN and CR around 1.6. CT 2019 w/o hydro.   3 - Rt Ureteral Stone - 54mm Rt UVJ stone with minimal hydro by ER CT 02/2018 on eval Rt flank pain. Stone is solitary, 1100HU, 70mm. Cr 1.5 (at baseline) and UA without infectious parameters.   PMH sig for IDDM2, HTN, Appy, CVA (some left sided weakness, no blood thinners), Rt Lung mass / metastatic small cell cancer, CAD/MI . He stays very active taking care of his 3 acre yard. His PCP is [name] MD with [facility].    Today " Arthur [name]" is seen to proceed with RIGHT ureteroscopic stone manipulation. He has had cardiac clearance. NO interval fevers.    Past Medical History:  Diagnosis Date  . Arthritis   . Ascending aortic aneurysm (HCC)    PER CT CHEST 03-08-18 IN EPIC , 4.0CM  . Chronic kidney disease   . Claudication ([place])   . Coronary artery disease 1997   CFX PCI, '97. Mod residual RI and RCA dis.  . Diabetes mellitus ([place])    IDDM  . Dyslipidemia   . Dyspnea   . GERD (gastroesophageal reflux disease)   . Hard of hearing   . History of bronchitis   . History of chemotherapy   . History of kidney stones 1958  . HTN (hypertension)   . Lacunar stroke ([place])   . Metastatic lung cancer (metastasis from lung to other site), unspecified laterality ([place])    DIAGNOSED 02-2018, MANAGED BY DR Bartlett  .  NSTEMI (non-ST elevated myocardial infarction) ([place])   . PAD (peripheral artery disease) ([place])   . Pneumonia   . Tobacco abuse   . Urinary incontinence   . Wears glasses     Past Surgical History:  Procedure Laterality Date  . APPENDECTOMY     "busted on me"  . COLONOSCOPY    . CORONARY ANGIOPLASTY  1997   CFX  . ENDARTERECTOMY FEMORAL Right 05/13/2016   iliofemoral endarterectomy with bovine pericardial patch angioplasty  . ENDARTERECTOMY FEMORAL Right 05/13/2016   Procedure: RIGHT ENDARTERECTOMY FEMORAL;  Surgeon: Serafina Mitchell, MD;  Location: [place];  Service: Vascular;  Laterality: Right;  . PATCH ANGIOPLASTY Right 05/13/2016   Procedure: [place];  Surgeon: Serafina Mitchell, MD;  Location: [place];  Service: Vascular;  Laterality: Right;  . PERIPHERAL VASCULAR CATHETERIZATION N/A 02/20/2016   Procedure: Lower Extremity Angiography;  Surgeon: Lorretta Harp, MD;  Location: [place] [facility];  Service: Cardiovascular;  Laterality: N/A;    Family History  Problem Relation Age of Onset  . Diabetes Sister   . Arthritis Mother   . Heart disease Father    Social History:  reports that he has quit smoking. His smoking use included cigarettes. He has a 16.25 pack-year smoking history. He has never used  smokeless tobacco. He reports that he does not drink alcohol or use drugs.  Allergies:  Allergies  Allergen Reactions  . Codeine Swelling    Medications Prior to Admission  Medication Sig Dispense Refill  . amLODipine (NORVASC) 5 MG tablet Take 1 tablet (5 mg total) by mouth daily. 180 tablet 3  . aspirin EC 81 MG EC tablet Take 1 tablet (81 mg total) by mouth daily. 30 tablet 1  . atorvastatin (LIPITOR) 20 MG tablet TAKE 1 TABLET (20 MG TOTAL) BY MOUTH DAILY. (Patient taking differently: Take 20 mg by mouth daily at 6 PM. ) 90 tablet 2  . clopidogrel (PLAVIX) 75 MG tablet Take 1 tablet (75 mg total) by mouth daily. 30 tablet 1  . docusate  sodium (COLACE) 100 MG capsule Take 100 mg by mouth 2 (two) times daily.    Marland Kitchen doxazosin (CARDURA) 1 MG tablet Take 3 mg by mouth at bedtime. Pt takes 3 at bedtime     . fenofibrate 160 MG tablet TAKE 1 TABLET (160 MG TOTAL) BY MOUTH DAILY. 30 tablet 6  . fish oil-omega-3 fatty acids 1000 MG capsule Take 2 g by mouth daily.     . insulin lispro (HUMALOG) 100 UNIT/ML injection Inject 0.35 mLs (35 Units total) into the skin 2 (two) times daily. (Patient taking differently: Inject 25-40 Units into the skin 2 (two) times daily at 10 AM and 5 PM. ) 10 mL 11  . isosorbide mononitrate (IMDUR) 30 MG 24 hr tablet Take 1 tablet (30 mg total) by mouth daily. 90 tablet 1  . meloxicam (MOBIC) 7.5 MG tablet Take 7.5 mg by mouth daily.    . metoprolol succinate (TOPROL-XL) 50 MG 24 hr tablet Take 1 tablet (50 mg total) by mouth daily. Take with or immediately following a meal. 90 tablet 1  . oxybutynin (DITROPAN) 5 MG tablet Take 5 mg by mouth daily.     . pantoprazole (PROTONIX) 40 MG tablet Take 1 tablet (40 mg total) by mouth daily. 30 tablet 1  . polyethylene glycol (MIRALAX / GLYCOLAX) packet Take 17 g by mouth 2 (two) times daily.    . nitroGLYCERIN (NITROSTAT) 0.4 MG SL tablet Place 1 tablet (0.4 mg total) under the tongue every 5 (five) minutes x 3 doses as needed for chest pain. (Patient not taking: Reported on 04/05/2018) 25 tablet 4  . prochlorperazine (COMPAZINE) 10 MG tablet Take 1 tablet (10 mg total) by mouth every 6 (six) hours as needed for nausea or vomiting. (Patient not taking: Reported on 04/05/2018) 30 tablet 0  . Sennosides (EX-LAX PO) Take 2 tablets by mouth daily as needed (constipation).      Results for orders placed or performed during the hospital encounter of 04/13/18 (from the past 48 hour(s))  Glucose, capillary     Status: Abnormal   Collection Time: 04/13/18 12:06 PM  Result Value Ref Range   Glucose-Capillary 131 (H) 70 - 99 mg/dL   No results found.  Review of Systems   Constitutional: Negative.  Negative for chills and fever.  HENT: Negative.   Eyes: Negative.   Respiratory: Negative.   Cardiovascular: Negative.   Gastrointestinal: Negative.   Genitourinary: Positive for flank pain and urgency.  Skin: Negative.   Neurological: Negative.   Endo/Heme/Allergies: Negative.   Psychiatric/Behavioral: Negative.     Blood pressure 124/73, pulse 88, temperature 97.8 F (36.6 C), temperature source Oral, resp. rate 18, SpO2 97 %. Physical Exam  Constitutional: He appears well-developed.  HENT:  Head: Normocephalic.  Eyes: Pupils are equal, round, and reactive to light.  Neck: Normal range of motion.  Cardiovascular: Normal rate.  Respiratory: Effort normal.  GI: Soft.  Genitourinary:  Genitourinary Comments: Mild Rt CVAT at present.   Neurological: He is alert.  Skin: Skin is warm.  Psychiatric: He has a normal mood and affect.     Assessment/Plan  Proceed as planned with RIGHT ureteroscopoic stone manipulation with goal of stone free. Risks, benefits, alternatives, expected peri-op course discussed previously and reiteratd today. He is at much higher risk of ALL peri-op complicaitons with his CV comorbidity.   Alexis Frock, MD 04/13/2018, 12:31 PM

## 2018-04-13 NOTE — Transfer of Care (Signed)
Immediate Anesthesia Transfer of Care Note  Patient: Arthur [name]  Procedure(s) Performed: CYSTOSCOPY WITH RETROGRADE PYELOGRAM, URETEROSCOPY AND STENT PLACEMENT (Right ) HOLMIUM LASER APPLICATION (Right )  Patient Location: PACU  Anesthesia Type:General  Level of Consciousness: awake, alert  and oriented  Airway & Oxygen Therapy: Patient Spontanous Breathing and Patient connected to face mask oxygen  Post-op Assessment: Report given to RN and Post -op Vital signs reviewed and stable  Post vital signs: Reviewed and stable  Last Vitals:  Vitals Value Taken Time  BP 156/81 04/13/2018  2:39 PM  Temp    Pulse 87 04/13/2018  2:43 PM  Resp 17 04/13/2018  2:43 PM  SpO2 100 % 04/13/2018  2:43 PM  Vitals shown include unvalidated device data.  Last Pain:  Vitals:   04/13/18 1239  TempSrc:   PainSc: 5       Patients Stated Pain Goal: 4 (44/69/50 7225)  Complications: No apparent anesthesia complications

## 2018-04-13 NOTE — Op Note (Signed)
Arthur, [name] MEDICAL RECORD [id number] ACCOUNT [id number] DATE OF BIRTH:Jun 05, 1941 FACILITY: [facility] LOCATION: [facility] PHYSICIAN:Ralphael Southgate, MD  OPERATIVE REPORT  DATE OF PROCEDURE:  04/13/2018  PREOPERATIVE DIAGNOSIS:  Right ureteral stone, significant oncologic and cardiovascular comorbidity.  PROCEDURE PERFORMED: 1.  Cystoscopy with right retrograde pyelogram, interpretation. 2.  Right ureteroscopy with laser lithotripsy. 3.  Insertion of right ureteral stent 5 x 26 Polaris with tether.  ESTIMATED BLOOD LOSS:  Nil.  COMPLICATIONS:  None.  SPECIMEN:  Right ureteral stone fragments for composition analysis.  FINDINGS: 1.  Right distal ureteral stone with significant tortuosity of the ureter. 2.  Complete resolution of all stone fragments larger than one-third millimeter following laser lithotripsy and basket extraction. 3.  Displaced right ureteral stent proximal and renal pelvis distally in urinary bladder.  INDICATIONS:  The patient is a very pleasant but recently unfortunate 77 year old gentleman with recent history of metastatic colon cancer.  Significant cardiovascular comorbidity with recent non-ST elevation heart attack.  He is having colicky flank  pain.  On evaluation, he has a right distal ureteral stone approximately 8 mm.  His flank pain is ongoing for several months; however, given his oncologic and cardiac comorbidity, intended management has been delayed several times.  He has had colic  without infectious parameters, and he wishes to proceed with definitive intervention with ureteroscopy with goal of stone free.  Informed consent was obtained and placed in the medical record.  PROCEDURE IN DETAIL:  The patient being identified and the procedure being right ureteroscopic stimulation was confirmed.  Procedure timeout was performed.  Intravenous antibiotics were administered.  General anesthesia was introduced.  The patient was  placed into a low  lithotomy position.  A sterile field was created by prepping and draping his penis, perineum and proximal thighs using iodine.  Cystourethroscopy was performed using a 22-French rigid cystoscope with offset lens.  Examination of the  anterior and posterior urethra revealed some moderate bilobar prostatic hypertrophy.  Inspection of urinary bladder revealed some mild trabeculation.  Ureteral orifices were singleton bilaterally.  The right ureteral orifice was cannulated with a  6-French renal catheter, and right retrograde pyelogram was obtained.  Right retrograde pyelogram demonstrated a single right ureter with a single-system right kidney.  There was a filling defect in the distal ureter consistent with known stone.  There was significant tortuosity above this.  A 0.038 ZIPwire was carefully  navigated to the upper pole and set aside as a safety wire.  An 8-French feeding tube was placed in the urinary bladder for pressure release, and semirigid ureteroscopy was performed of the distal right ureter alongside a separate sensor working wire.   As expected, there was a large distal ureteral stone which was much too large for simple basketing.  Holmium laser energy applied at a setting of 0.2 joules and 20 Hz.  Approximately 50% of stone volume was ablated in a dusting technique.  The remaining  30% volume was fragmented into pieces approximately 1-2 mm.  These were then sequentially grasped with the long axis, removed to the level of the urinary bladder.  Semi-rigid ureteroscopy of the remainder of the distal 4/5 right ureter revealed no  additional stone fragments.  As this patient's known stone is solitary, it was not felt that endoscopic evaluation above this was warranted.  Given the patient's tortuosity and impaction of stone, it was felt that brief interval stenting with a tethered  stent would be warranted.  As such, a new 5 x 26  Polaris-type stent was placed over the remaining safety wire using  cystoscopic and fluoroscopic guidance.  Good proximal and distal points were noted.  A tether was left in place and fashioned to the  dorsum of the penis.  Stone fragments were irrigated out via cystoscope and set aside for composition analysis.  The bladder was partially filled, and the procedure was terminated.  The patient tolerated the procedure well.  No immediate periprocedural  complications.  The patient was taken to postanesthesia care in stable condition with plan for trial of void before discharge.  LN/NUANCE  D:04/13/2018 T:04/13/2018 JOB:[id number]

## 2018-04-13 NOTE — Progress Notes (Signed)
Pt must void prior to discharge per Dr Tresa Moore.

## 2018-04-13 NOTE — Brief Op Note (Signed)
04/13/2018  2:25 PM  PATIENT:  Everett Graff III  77 y.o. male  PRE-OPERATIVE DIAGNOSIS:  RIGHT URETERAL STONE  POST-OPERATIVE DIAGNOSIS:  RIGHT URETERAL STONE  PROCEDURE:  Procedure(s) with comments: CYSTOSCOPY WITH RETROGRADE PYELOGRAM, URETEROSCOPY AND STENT PLACEMENT (Right) - 1 HR HOLMIUM LASER APPLICATION (Right)  SURGEON:  Surgeon(s) and Role:    Alexis Frock, MD - Primary  PHYSICIAN ASSISTANT:   ASSISTANTS: none   ANESTHESIA:   general  EBL:  Minimal   BLOOD ADMINISTERED:none  DRAINS: none   LOCAL MEDICATIONS USED:  NONE   SPECIMEN:  Source of Specimen:  Rt ureteral stone fragments  DISPOSITION OF SPECIMEN:  [facility] for compositiona analysis  COUNTS:  YES  TOURNIQUET:  * No tourniquets in log *  DICTATION: .Other Dictation: Dictation Number [id number]  PLAN OF CARE: Discharge to home after PACU  PATIENT DISPOSITION:  PACU - hemodynamically stable.   Delay start of Pharmacological VTE agent (>24hrs) due to surgical blood loss or risk of bleeding: yes

## 2018-04-13 NOTE — Discharge Instructions (Signed)
1 - You may have urinary urgency (bladder spasms) and bloody urine on / off with stent in place. This is normal. ° °2 - Remove tethered stent on Friday morning at home by pulling on string, then blue-white plastic tubing, and discarding. Office is open Friday if any problems arise.  ° °3 - Call MD or go to ER for fever >102, severe pain / nausea / vomiting not relieved by medications, or acute change in medical status ° °

## 2018-04-14 ENCOUNTER — Telehealth: Payer: Self-pay | Admitting: Medical Oncology

## 2018-04-14 ENCOUNTER — Encounter (HOSPITAL_COMMUNITY): Payer: Self-pay | Admitting: Urology

## 2018-04-14 NOTE — Telephone Encounter (Signed)
request weekly labs done at Dr Edrick Oh office. Order faxed.

## 2018-04-14 NOTE — Anesthesia Postprocedure Evaluation (Signed)
Anesthesia Post Note  Patient: Arthur [name]  Procedure(s) Performed: CYSTOSCOPY WITH RETROGRADE PYELOGRAM, URETEROSCOPY AND STENT PLACEMENT (Right ) HOLMIUM LASER APPLICATION (Right )     Patient location during evaluation: PACU Anesthesia Type: General Level of consciousness: awake and alert, awake and oriented Pain management: pain level controlled Vital Signs Assessment: post-procedure vital signs reviewed and stable Respiratory status: spontaneous breathing, nonlabored ventilation and respiratory function stable Cardiovascular status: blood pressure returned to baseline and stable Postop Assessment: no apparent nausea or vomiting Anesthetic complications: no    Last Vitals:  Vitals:   04/13/18 1520 04/13/18 1630  BP: (!) 152/70 114/63  Pulse: 80 73  Resp: 14 12  Temp: 36.9 C   SpO2: 92% 94%    Last Pain:  Vitals:   04/14/18 1131  TempSrc:   PainSc: 3                  Catalina Gravel

## 2018-04-15 NOTE — Progress Notes (Signed)
[facility] OFFICE PROGRESS NOTE  Dione Housekeeper, MD [place] [address]  DIAGNOSIS: Extensive stage (T2b, N3, M1c) small cell lung cancer,presented with large right upper lobe lung mass with extension to the right hilum in addition to the external and supraclavicular lymphadenopathy as well as metastatic disease to the retroperitoneal lymph nodes and left adrenal gland and suspicious right scapular bone lesion diagnosed in August 2019  PRIOR THERAPY: None.  CURRENT THERAPY: Systemic chemotherapy with carboplatin for AUC of 5 on day 1, etoposide 100 mg/M2 on days 1, 2 and 3 as well as Tecentriq 1200 mg IV every 3 weeks with Neulasta support.  First dose 03/28/2018.  Status post 1 cycle.  INTERVAL HISTORY: Amardeep Beckers III 77 y.o. male returns for a routine follow-up visit accompanied by his son-in-law.  The patient has no specific complaints today except for weight loss.  He reports that he has a good appetite but has lost weight despite this.  He denies fevers and chills.  Denies chest pain, cough, hemoptysis.  He has shortness of breath with exertion.  Denies nausea, vomiting, constipation, diarrhea.  Denies rashes.  The patient is here for evaluation prior to starting cycle #2 of his treatment.  MEDICAL HISTORY: Past Medical History:  Diagnosis Date  . Arthritis   . Ascending aortic aneurysm (HCC)    PER CT CHEST 03-08-18 IN [facility] , 4.0CM  . Chronic kidney disease   . Claudication ([place])   . Coronary artery disease 1997   CFX PCI, '97. Mod residual RI and RCA dis.  . Diabetes mellitus ([place])    IDDM  . Dyslipidemia   . Dyspnea   . GERD (gastroesophageal reflux disease)   . Hard of hearing   . History of bronchitis   . History of chemotherapy   . History of kidney stones 1958  . HTN (hypertension)   . Lacunar stroke ([place])   . Metastatic lung cancer (metastasis from lung to other site), unspecified laterality ([place])    DIAGNOSED 02-2018, MANAGED BY  DR Lenox  . NSTEMI (non-ST elevated myocardial infarction) ([place])   . PAD (peripheral artery disease) ([place])   . Pneumonia   . Tobacco abuse   . Urinary incontinence   . Wears glasses     ALLERGIES:  is allergic to codeine.  MEDICATIONS:  Current Outpatient Medications  Medication Sig Dispense Refill  . amLODipine (NORVASC) 5 MG tablet Take 1 tablet (5 mg total) by mouth daily. 180 tablet 3  . aspirin EC 81 MG EC tablet Take 1 tablet (81 mg total) by mouth daily. 30 tablet 1  . atorvastatin (LIPITOR) 20 MG tablet TAKE 1 TABLET (20 MG TOTAL) BY MOUTH DAILY. (Patient taking differently: Take 20 mg by mouth daily at 6 PM. ) 90 tablet 2  . clopidogrel (PLAVIX) 75 MG tablet Take 1 tablet (75 mg total) by mouth daily. 30 tablet 1  . docusate sodium (COLACE) 100 MG capsule Take 100 mg by mouth 2 (two) times daily.    Marland Kitchen doxazosin (CARDURA) 1 MG tablet Take 3 mg by mouth at bedtime. Pt takes 3 at bedtime     . fenofibrate 160 MG tablet TAKE 1 TABLET (160 MG TOTAL) BY MOUTH DAILY. 30 tablet 6  . fish oil-omega-3 fatty acids 1000 MG capsule Take 2 g by mouth daily.     . insulin lispro (HUMALOG) 100 UNIT/ML injection Inject 0.35 mLs (35 Units total) into the skin 2 (two) times  daily. (Patient taking differently: Inject 25-40 Units into the skin 2 (two) times daily at 10 AM and 5 PM. ) 10 mL 11  . isosorbide mononitrate (IMDUR) 30 MG 24 hr tablet Take 1 tablet (30 mg total) by mouth daily. 90 tablet 1  . meloxicam (MOBIC) 7.5 MG tablet Take 7.5 mg by mouth daily.    . metoprolol succinate (TOPROL-XL) 50 MG 24 hr tablet Take 1 tablet (50 mg total) by mouth daily. Take with or immediately following a meal. 90 tablet 1  . nitroGLYCERIN (NITROSTAT) 0.4 MG SL tablet Place 1 tablet (0.4 mg total) under the tongue every 5 (five) minutes x 3 doses as needed for chest pain. 25 tablet 4  . oxybutynin (DITROPAN) 5 MG tablet Take 5 mg by mouth daily.     . pantoprazole (PROTONIX) 40 MG tablet  Take 1 tablet (40 mg total) by mouth daily. 30 tablet 1  . polyethylene glycol (MIRALAX / GLYCOLAX) packet Take 17 g by mouth 2 (two) times daily.    . prochlorperazine (COMPAZINE) 10 MG tablet Take 1 tablet (10 mg total) by mouth every 6 (six) hours as needed for nausea or vomiting. 30 tablet 0  . senna-docusate (SENOKOT-S) 8.6-50 MG tablet Take 1 tablet by mouth 2 (two) times daily. While taking strong pain meds to prevent constipation 20 tablet 0  . Sennosides (EX-LAX PO) Take 2 tablets by mouth daily as needed (constipation).    . traMADol (ULTRAM) 50 MG tablet Take 1-2 tablets (50-100 mg total) by mouth every 6 (six) hours as needed for moderate pain or severe pain. Post-operatively 20 tablet 0   No current facility-administered medications for this visit.     SURGICAL HISTORY:  Past Surgical History:  Procedure Laterality Date  . APPENDECTOMY     "busted on me"  . COLONOSCOPY    . CORONARY ANGIOPLASTY  1997   CFX  . CYSTOSCOPY WITH RETROGRADE PYELOGRAM, URETEROSCOPY AND STENT PLACEMENT Right 04/13/2018   Procedure: CYSTOSCOPY WITH RETROGRADE PYELOGRAM, URETEROSCOPY AND STENT PLACEMENT;  Surgeon: Alexis Frock, MD;  Location: [facility];  Service: Urology;  Laterality: Right;  1 HR  . ENDARTERECTOMY FEMORAL Right 05/13/2016   iliofemoral endarterectomy with bovine pericardial patch angioplasty  . ENDARTERECTOMY FEMORAL Right 05/13/2016   Procedure: RIGHT ENDARTERECTOMY FEMORAL;  Surgeon: Serafina Mitchell, MD;  Location: [facility];  Service: Vascular;  Laterality: Right;  . HOLMIUM LASER APPLICATION Right 4/50/3888   Procedure: HOLMIUM LASER APPLICATION;  Surgeon: Alexis Frock, MD;  Location: [facility];  Service: Urology;  Laterality: Right;  . PATCH ANGIOPLASTY Right 05/13/2016   Procedure: [place];  Surgeon: Serafina Mitchell, MD;  Location: [facility];  Service: Vascular;  Laterality: Right;  . PERIPHERAL VASCULAR CATHETERIZATION N/A 02/20/2016   Procedure:  Lower Extremity Angiography;  Surgeon: Lorretta Harp, MD;  Location: [facility];  Service: Cardiovascular;  Laterality: N/A;    REVIEW OF SYSTEMS:   Review of Systems  Constitutional: Negative for chills and fever.  Positive for fatigue and weight loss. HENT:   Negative for mouth sores, nosebleeds, sore throat and trouble swallowing.   Eyes: Negative for eye problems and icterus.  Respiratory: Negative for cough, hemoptysis, and wheezing.  Positive for shortness of breath with exertion. Cardiovascular: Negative for chest pain and leg swelling.  Gastrointestinal: Negative for abdominal pain, constipation, diarrhea, nausea and vomiting.  Genitourinary: Negative for bladder incontinence, difficulty urinating, dysuria, frequency and hematuria.   Musculoskeletal: Negative for  back pain, gait problem, neck pain and neck stiffness.  Skin: Negative for itching and rash.  Neurological: Negative for dizziness, extremity weakness, gait problem, headaches, light-headedness and seizures.  Hematological: Negative for adenopathy. Does not bruise/bleed easily.  Psychiatric/Behavioral: Negative for confusion, depression and sleep disturbance. The patient is not nervous/anxious.     PHYSICAL EXAMINATION:  Blood pressure 123/69, pulse 82, temperature 97.6 F (36.4 C), temperature source Oral, resp. rate 14, height 5\' 8"  (1.727 m), weight 154 lb 4.8 oz (70 kg), SpO2 100 %.  ECOG PERFORMANCE STATUS: 1 - Symptomatic but completely ambulatory  Physical Exam  Constitutional: Oriented to person, place, and time. No distress.  HENT:  Head: Normocephalic and atraumatic.  Mouth/Throat: Oropharynx is clear and moist. No oropharyngeal exudate.  Eyes: Conjunctivae are normal. Right eye exhibits no discharge. Left eye exhibits no discharge. No scleral icterus.  Neck: Normal range of motion. Neck supple.  Cardiovascular: Normal rate, regular rhythm, normal heart sounds and intact distal pulses.    Pulmonary/Chest: Effort normal and breath sounds normal. No respiratory distress. No wheezes. No rales.  Abdominal: Soft. Bowel sounds are normal. Exhibits no distension and no mass. There is no tenderness.  Musculoskeletal: Normal range of motion. Exhibits no edema.  Lymphadenopathy:    No cervical adenopathy.  Neurological: Alert and oriented to person, place, and time. Exhibits normal muscle tone. Gait normal. Coordination normal.  Skin: Skin is warm and dry. No rash noted. Not diaphoretic. No erythema. No pallor.  Psychiatric: Mood, memory and judgment normal.  Vitals reviewed.  LABORATORY DATA: Lab Results  Component Value Date   WBC 14.1 (H) 04/18/2018   HGB 9.9 (L) 04/18/2018   HCT 28.7 (L) 04/18/2018   MCV 87.7 04/18/2018   PLT 362 04/18/2018      Chemistry      Component Value Date/Time   NA 131 (L) 04/18/2018 1029   NA 133 (L) 03/30/2018 0829   K 3.9 04/18/2018 1029   CL 98 04/18/2018 1029   CO2 24 04/18/2018 1029   BUN 16 04/18/2018 1029   BUN 31 (H) 03/30/2018 0829   CREATININE 1.25 (H) 04/18/2018 1029   CREATININE 1.46 (H) 02/27/2016 0820      Component Value Date/Time   CALCIUM 9.1 04/18/2018 1029   ALKPHOS 110 04/18/2018 1029   AST 16 04/18/2018 1029   ALT 11 04/18/2018 1029   BILITOT 0.4 04/18/2018 1029       RADIOGRAPHIC STUDIES:  Mr Jeri Cos KD Contrast  Result Date: 03/31/2018 CLINICAL DATA:  Small-cell lung cancer staging. EXAM: MRI HEAD WITHOUT AND WITH CONTRAST TECHNIQUE: Multiplanar, multiecho pulse sequences of the brain and surrounding structures were obtained without and with intravenous contrast. CONTRAST:  7 mL Gadavist COMPARISON:  02/10/2017 FINDINGS: Brain: There is no evidence of acute infarct, intracranial hemorrhage, mass, midline shift, or extra-axial fluid collection. There is mild to moderate cerebral atrophy. Chronic lacunar infarcts are present in the corona radiata bilaterally, left basal ganglia, left periatrial white matter,  and right pons, unchanged. Additional patchy T2 hyperintensities in the cerebral white matter and pons are nonspecific but compatible with moderate chronic small vessel ischemic disease, progressed in the pons from the prior study with a new chronic lacunar infarct on the left. No abnormal enhancement is identified. Vascular: Major intracranial vascular flow voids are preserved. Skull and upper cervical spine: No suspicious calvarial lesion. Extensive marrow abnormality with heterogeneous enhancement involving the C2 vertebral body and portions of the dens and posterior elements, new from the  prior study. Suspected new marrow abnormality in the included superior portion of the C3 vertebral body is well. Sinuses/Orbits: Unremarkable orbits. Minimal scattered paranasal sinus mucosal thickening. Clear mastoid air cells. Other: None. IMPRESSION: 1. No evidence of intracranial metastases. 2. Bone metastases in the upper cervical spine. 3. Mild-to-moderate chronic small vessel ischemic disease and cerebral atrophy. Electronically Signed   By: Logan Bores M.D.   On: 03/31/2018 16:46   Nm Pet Image Initial (pi) Skull Base To Thigh  Result Date: 03/31/2018 CLINICAL DATA:  Initial treatment strategy for small cell lung cancer. EXAM: NUCLEAR MEDICINE PET SKULL BASE TO THIGH TECHNIQUE: 8.5 mCi F-18 FDG was injected intravenously. Full-ring PET imaging was performed from the skull base to thigh after the radiotracer. CT data was obtained and used for attenuation correction and anatomic localization. Fasting blood glucose: 131 mg/dl COMPARISON:  CT chest 03/08/2018 and CT abdomen pelvis 02/25/2018. FINDINGS: Mediastinal blood pool activity: SUV max 2.5 NECK: There are bulky hypermetabolic lymph nodes in low internal jugular stations bilaterally, measuring up to 2.5 cm on the right, with an SUV max 7 8. Incidental CT findings: None. CHEST: Bulky hypermetabolic mediastinal and right hilar adenopathy. Index low right  paratracheal lymph node measures 2.9 cm with an SUV max of 8 4. Impacted bronchi and soft tissue in the right perihilar region have an SUV max of 6.5. Extrapleural soft tissue in the medial mid left hemithorax measures approximately 3.3 cm (CT image 83) with an SUV max 6.3. Incidental CT findings: Atherosclerotic calcification of the arterial vasculature, including extensive three-vessel involvement of the coronary arteries. Heart is enlarged. Small pericardial effusion. Small bilateral pleural effusions, right greater than left. ABDOMEN/PELVIS: Bilateral hypermetabolic adrenal metastases measure up to 2.5 cm on the left with an SUV max of 5.5. There are numerous soft tissue nodules in the abdomen, which are predominantly retroperitoneal in location. Index left perirenal nodule measures 2.7 cm (CT image 119) with an SUV max of 4.6. No abnormal hypermetabolism in the liver, pancreas or spleen. Incidental CT findings: Liver is unremarkable. Numerous stones are seen in the gallbladder. Kidneys, spleen, pancreas, stomach and bowel are grossly unremarkable. Atherosclerotic calcification of the arterial vasculature. Prostate is nodular. SKELETON: There are hypermetabolic osseous metastases. Index lesion in the right scapula has an associated pathologic fracture, with an SUV max 5.4. There are scattered subcutaneous hypermetabolic nodules. Index nodule overlying the upper thoracic spine measures 1.6 cm (CT image 53) with an SUV max of 4.4. Incidental CT findings: Degenerative changes in the spine. IMPRESSION: 1. Widespread metastatic small-cell lung cancer involving low neck/intrathoracic lymph nodes, right upper lobe, left extrapleural space, adrenal glands, retroperitoneum, subcutaneous nodules and bones. 2. Pathologic right scapular fracture. 3. 4. Aortic atherosclerosis (ICD10-170.0). Extensive 3 vessel coronary artery calcification. 5. Cholelithiasis. 6. Nodular prostate. Electronically Signed   By: Lorin Picket  M.D.   On: 03/31/2018 15:41   Dg C-arm 1-60 Min-no Report  Result Date: 04/13/2018 Fluoroscopy was utilized by the requesting physician.  No radiographic interpretation.     ASSESSMENT/PLAN:  Small cell lung cancer, right [facility]) This is a very pleasant 77 year old white male recently diagnosed with extensive stage (T2b, N3, M1c) small cell lung cancer presented with right upper lobe lung mass in addition to right mediastinal and supraclavicular lymphadenopathy as well as metastatic disease to the retroperitoneal lymph nodes, left adrenal gland as well as bone disease in the right scapula diagnosed in August 2019. The patient is currently on treatment with carboplatin, etoposide and  Tecentriq status post 1 cycle which he tolerated fairly well with the exception of fatigue.  The patient was seen with Dr. Julien Nordmann.  Labs have been reviewed.  Recommend for him to proceed with cycle 2 of his treatment today as scheduled.  He will have a restaging CT scan of the chest, abdomen, pelvis prior to his next visit.  He will follow-up in 3 weeks for evaluation prior to cycle #3 and to review his restaging CT scan results. The patient will have weekly labs at his primary care provider's office to be faxed to [place].  For his weight loss, a referral to the dietitian has been made.  He was encouraged to drink 2 to 3 cans of boost or Ensure per day.  The patient has multiple social issues and we will arrange for him to see the social worker at the cancer center today to address his issues.  He was advised to call immediately if he has any concerning symptoms in the interval. The patient voices understanding of current disease status and treatment options and is in agreement with the current care plan.   Orders Placed This Encounter  Procedures  . CT ABDOMEN PELVIS W CONTRAST    Standing Status:   Future    Standing Expiration Date:   04/19/2019    Order Specific Question:   If indicated for the ordered  procedure, I authorize the administration of contrast media per Radiology protocol    Answer:   Yes    Order Specific Question:   Preferred imaging location?    Answer:   [facility]    Order Specific Question:   Radiology Contrast Protocol - do NOT remove file path    Answer:   \\[email or web address]    Order Specific Question:   ** REASON FOR EXAM (FREE TEXT)    Answer:   Lung cancer. Restaging.  . CT CHEST W CONTRAST    Standing Status:   Future    Standing Expiration Date:   04/19/2019    Order Specific Question:   If indicated for the ordered procedure, I authorize the administration of contrast media per Radiology protocol    Answer:   Yes    Order Specific Question:   Preferred imaging location?    Answer:   [facility]    Order Specific Question:   Radiology Contrast Protocol - do NOT remove file path    Answer:   \\[email or web address]    Order Specific Question:   ** REASON FOR EXAM (FREE TEXT)    Answer:   Lung cancer. Restaging.  . Ambulatory referral to Social Work    Referral Priority:   Routine    Referral Type:   Consultation    Referral Reason:   Specialty Services Required    Number of Visits Requested:   1  . Ambulatory referral to Nutrition and Diabetic E    Referral Priority:   Routine    Referral Type:   Consultation    Referral Reason:   Specialty Services Required    Number of Visits Requested:   1     Mikey Bussing, DNP, AGPCNP-BC, AOCNP 04/18/18   ADDENDUM: Hematology/Oncology Attending: I had a face-to-face encounter with the patient today.  I recommended his care plan.  This is a very pleasant 77 years old white male recently diagnosed with extensive stage small cell lung cancer.  The patient is currently undergoing systemic chemotherapy with carboplatin, etoposide and Tecentriq status post 1 cycle.  He  tolerated the first cycle of his treatment very well with no concerning adverse  effects. I recommended for the patient to proceed with cycle #2 today as a schedule. I will see him back for follow-up visit in 3 weeks for evaluation before starting cycle #3 with repeat CT scan of the chest, abdomen and pelvis for restaging of his disease. The patient was advised to call immediately if he has any concerning symptoms in the interval.  Disclaimer: This note was dictated with voice recognition software. Similar sounding words can inadvertently be transcribed and may be missed upon review. Eilleen Kempf, MD 04/18/18

## 2018-04-15 NOTE — Assessment & Plan Note (Addendum)
This is a very pleasant 77 year old white male recently diagnosed with extensive stage (T2b, N3, M1c) small cell lung cancer presented with right upper lobe lung mass in addition to right mediastinal and supraclavicular lymphadenopathy as well as metastatic disease to the retroperitoneal lymph nodes, left adrenal gland as well as bone disease in the right scapula diagnosed in August 2019. The patient is currently on treatment with carboplatin, etoposide and Tecentriq status post 1 cycle which he tolerated fairly well with the exception of fatigue.  The patient was seen with Dr. Julien Nordmann.  Labs have been reviewed.  Recommend for him to proceed with cycle 2 of his treatment today as scheduled.  He will have a restaging CT scan of the chest, abdomen, pelvis prior to his next visit.  He will follow-up in 3 weeks for evaluation prior to cycle #3 and to review his restaging CT scan results. The patient will have weekly labs at his primary care provider's office to be faxed to [place].  For his weight loss, a referral to the dietitian has been made.  He was encouraged to drink 2 to 3 cans of boost or Ensure per day.  The patient has multiple social issues and we will arrange for him to see the social worker at the cancer center today to address his issues.  He was advised to call immediately if he has any concerning symptoms in the interval. The patient voices understanding of current disease status and treatment options and is in agreement with the current care plan.

## 2018-04-18 ENCOUNTER — Inpatient Hospital Stay: Payer: Medicare Other

## 2018-04-18 ENCOUNTER — Ambulatory Visit: Payer: Medicare Other | Admitting: Nutrition

## 2018-04-18 ENCOUNTER — Inpatient Hospital Stay (HOSPITAL_BASED_OUTPATIENT_CLINIC_OR_DEPARTMENT_OTHER): Payer: Medicare Other | Admitting: Oncology

## 2018-04-18 ENCOUNTER — Encounter: Payer: Self-pay | Admitting: Oncology

## 2018-04-18 ENCOUNTER — Telehealth: Payer: Self-pay | Admitting: Internal Medicine

## 2018-04-18 VITALS — BP 123/69 | HR 82 | Temp 97.6°F | Resp 14 | Ht 68.0 in | Wt 154.3 lb

## 2018-04-18 DIAGNOSIS — I714 Abdominal aortic aneurysm, without rupture: Secondary | ICD-10-CM

## 2018-04-18 DIAGNOSIS — E785 Hyperlipidemia, unspecified: Secondary | ICD-10-CM

## 2018-04-18 DIAGNOSIS — C3491 Malignant neoplasm of unspecified part of right bronchus or lung: Secondary | ICD-10-CM

## 2018-04-18 DIAGNOSIS — K219 Gastro-esophageal reflux disease without esophagitis: Secondary | ICD-10-CM

## 2018-04-18 DIAGNOSIS — R531 Weakness: Secondary | ICD-10-CM

## 2018-04-18 DIAGNOSIS — Z5111 Encounter for antineoplastic chemotherapy: Secondary | ICD-10-CM | POA: Diagnosis not present

## 2018-04-18 DIAGNOSIS — I129 Hypertensive chronic kidney disease with stage 1 through stage 4 chronic kidney disease, or unspecified chronic kidney disease: Secondary | ICD-10-CM

## 2018-04-18 DIAGNOSIS — N200 Calculus of kidney: Secondary | ICD-10-CM

## 2018-04-18 DIAGNOSIS — I313 Pericardial effusion (noninflammatory): Secondary | ICD-10-CM

## 2018-04-18 DIAGNOSIS — C7951 Secondary malignant neoplasm of bone: Secondary | ICD-10-CM

## 2018-04-18 DIAGNOSIS — Z87442 Personal history of urinary calculi: Secondary | ICD-10-CM

## 2018-04-18 DIAGNOSIS — N189 Chronic kidney disease, unspecified: Secondary | ICD-10-CM

## 2018-04-18 DIAGNOSIS — C7972 Secondary malignant neoplasm of left adrenal gland: Secondary | ICD-10-CM

## 2018-04-18 DIAGNOSIS — Z5112 Encounter for antineoplastic immunotherapy: Secondary | ICD-10-CM

## 2018-04-18 DIAGNOSIS — Z7982 Long term (current) use of aspirin: Secondary | ICD-10-CM

## 2018-04-18 DIAGNOSIS — J90 Pleural effusion, not elsewhere classified: Secondary | ICD-10-CM

## 2018-04-18 DIAGNOSIS — C778 Secondary and unspecified malignant neoplasm of lymph nodes of multiple regions: Secondary | ICD-10-CM

## 2018-04-18 DIAGNOSIS — E46 Unspecified protein-calorie malnutrition: Secondary | ICD-10-CM | POA: Insufficient documentation

## 2018-04-18 DIAGNOSIS — I252 Old myocardial infarction: Secondary | ICD-10-CM

## 2018-04-18 DIAGNOSIS — I739 Peripheral vascular disease, unspecified: Secondary | ICD-10-CM

## 2018-04-18 DIAGNOSIS — C3411 Malignant neoplasm of upper lobe, right bronchus or lung: Secondary | ICD-10-CM

## 2018-04-18 DIAGNOSIS — Z79899 Other long term (current) drug therapy: Secondary | ICD-10-CM

## 2018-04-18 DIAGNOSIS — R5382 Chronic fatigue, unspecified: Secondary | ICD-10-CM

## 2018-04-18 DIAGNOSIS — M129 Arthropathy, unspecified: Secondary | ICD-10-CM

## 2018-04-18 DIAGNOSIS — R5383 Other fatigue: Secondary | ICD-10-CM

## 2018-04-18 DIAGNOSIS — I700 Atherosclerosis of aorta: Secondary | ICD-10-CM

## 2018-04-18 DIAGNOSIS — Z7689 Persons encountering health services in other specified circumstances: Secondary | ICD-10-CM

## 2018-04-18 DIAGNOSIS — I2510 Atherosclerotic heart disease of native coronary artery without angina pectoris: Secondary | ICD-10-CM

## 2018-04-18 DIAGNOSIS — E119 Type 2 diabetes mellitus without complications: Secondary | ICD-10-CM

## 2018-04-18 DIAGNOSIS — R634 Abnormal weight loss: Secondary | ICD-10-CM

## 2018-04-18 LAB — CMP (CANCER CENTER ONLY)
ALBUMIN: 3.3 g/dL — AB (ref 3.5–5.0)
ALT: 11 U/L (ref 0–44)
ANION GAP: 9 (ref 5–15)
AST: 16 U/L (ref 15–41)
Alkaline Phosphatase: 110 U/L (ref 38–126)
BUN: 16 mg/dL (ref 8–23)
CO2: 24 mmol/L (ref 22–32)
Calcium: 9.1 mg/dL (ref 8.9–10.3)
Chloride: 98 mmol/L (ref 98–111)
Creatinine: 1.25 mg/dL — ABNORMAL HIGH (ref 0.61–1.24)
GFR, Est AFR Am: >60 mL/min (ref >60)
GFR, Estimated: 53 mL/min — ABNORMAL LOW (ref >60)
Glucose, Bld: 165 mg/dL — ABNORMAL HIGH (ref 70–99)
POTASSIUM: 3.9 mmol/L (ref 3.5–5.1)
SODIUM: 131 mmol/L — AB (ref 135–145)
Total Bilirubin: 0.4 mg/dL (ref 0.3–1.2)
Total Protein: 6.6 g/dL (ref 6.5–8.1)

## 2018-04-18 LAB — CBC WITH DIFFERENTIAL (CANCER CENTER ONLY)
BASOS PCT: 1 %
Basophils Absolute: 0.1 10*3/uL (ref 0.0–0.1)
EOS ABS: 0 10*3/uL (ref 0.0–0.5)
Eosinophils Relative: 0 %
HCT: 28.7 % — ABNORMAL LOW (ref 38.4–49.9)
Hemoglobin: 9.9 g/dL — ABNORMAL LOW (ref 13.0–17.1)
Lymphocytes Relative: 11 %
Lymphs Abs: 1.6 10*3/uL (ref 0.9–3.3)
MCH: 30.1 pg (ref 27.2–33.4)
MCHC: 34.3 g/dL (ref 32.0–36.0)
MCV: 87.7 fL (ref 79.3–98.0)
MONOS PCT: 7 %
Monocytes Absolute: 1 10*3/uL — ABNORMAL HIGH (ref 0.1–0.9)
NEUTROS PCT: 81 %
Neutro Abs: 11.5 10*3/uL — ABNORMAL HIGH (ref 1.5–6.5)
Platelet Count: 362 10*3/uL (ref 140–400)
RBC: 3.28 MIL/uL — ABNORMAL LOW (ref 4.20–5.82)
RDW: 14.8 % — AB (ref 11.0–14.6)
WBC Count: 14.1 10*3/uL — ABNORMAL HIGH (ref 4.0–10.3)

## 2018-04-18 LAB — TSH: TSH: 0.89 u[IU]/mL (ref 0.320–4.118)

## 2018-04-18 MED ORDER — SODIUM CHLORIDE 0.9 % IV SOLN
1200.0000 mg | Freq: Once | INTRAVENOUS | Status: AC
  Administered 2018-04-18: 1200 mg via INTRAVENOUS
  Filled 2018-04-18: qty 20

## 2018-04-18 MED ORDER — SODIUM CHLORIDE 0.9 % IV SOLN
380.0000 mg | Freq: Once | INTRAVENOUS | Status: AC
  Administered 2018-04-18: 380 mg via INTRAVENOUS
  Filled 2018-04-18: qty 38

## 2018-04-18 MED ORDER — SODIUM CHLORIDE 0.9 % IV SOLN
Freq: Once | INTRAVENOUS | Status: AC
  Administered 2018-04-18: 13:00:00 via INTRAVENOUS
  Filled 2018-04-18: qty 250

## 2018-04-18 MED ORDER — PALONOSETRON HCL INJECTION 0.25 MG/5ML
INTRAVENOUS | Status: AC
  Filled 2018-04-18: qty 5

## 2018-04-18 MED ORDER — PALONOSETRON HCL INJECTION 0.25 MG/5ML
0.2500 mg | Freq: Once | INTRAVENOUS | Status: AC
  Administered 2018-04-18: 0.25 mg via INTRAVENOUS

## 2018-04-18 MED ORDER — SODIUM CHLORIDE 0.9 % IV SOLN
100.0000 mg/m2 | Freq: Once | INTRAVENOUS | Status: AC
  Administered 2018-04-18: 190 mg via INTRAVENOUS
  Filled 2018-04-18: qty 9.5

## 2018-04-18 MED ORDER — SODIUM CHLORIDE 0.9 % IV SOLN
Freq: Once | INTRAVENOUS | Status: AC
  Administered 2018-04-18: 13:00:00 via INTRAVENOUS
  Filled 2018-04-18: qty 5

## 2018-04-18 NOTE — Telephone Encounter (Signed)
Question posed regarding treatment for December 23,24,25 or 26,27,28.  I am working this schedule per 9/30 los

## 2018-04-18 NOTE — Patient Instructions (Signed)
[facility] Discharge Instructions for Patients Receiving Chemotherapy  Today you received the following chemotherapy agents: Tecentriq, Carboplatin and Etoposide.  To help prevent nausea and vomiting after your treatment, we encourage you to take your nausea medication as directed.    If you develop nausea and vomiting that is not controlled by your nausea medication, call the clinic.   BELOW ARE SYMPTOMS THAT SHOULD BE REPORTED IMMEDIATELY:  *FEVER GREATER THAN 100.5 F  *CHILLS WITH OR WITHOUT FEVER  NAUSEA AND VOMITING THAT IS NOT CONTROLLED WITH YOUR NAUSEA MEDICATION  *UNUSUAL SHORTNESS OF BREATH  *UNUSUAL BRUISING OR BLEEDING  TENDERNESS IN MOUTH AND THROAT WITH OR WITHOUT PRESENCE OF ULCERS  *URINARY PROBLEMS  *BOWEL PROBLEMS  UNUSUAL RASH Items with * indicate a potential emergency and should be followed up as soon as possible.  Feel free to call the clinic should you have any questions or concerns. The clinic phone number is [phone].  Please show the [place] at check-in to the Emergency Department and triage nurse.

## 2018-04-18 NOTE — Progress Notes (Signed)
77 year old male diagnosed with small cell lung cancer.  He is a patient of Dr. Julien Nordmann.  Past medical history includes chronic kidney disease, insulin-dependent diabetes, GERD, and hypertension.  Medications include Lipitor, Colace, Humalog, Protonix, MiraLAX, Compazine, and Senokot.  Labs include sodium 131, glucose 165, creatinine 1.25, and albumin 3.3.  Height: 68 inches. Weight: 154.3 pounds. Usual body weight: 178 pounds in May. BMI: 23.46.  Patient reports he and his son live together. He states that neither of them likes to cook much so he is having difficulty finding foods to eat. Patient denies nutrition impact symptoms. He is trying to control his blood sugars.   He endorses 25 pound weight loss since May. He enjoys strawberry Ensure. Patient declines nutrition physical focused exam.  Nutrition diagnosis: Unintended weight loss related to small cell lung cancer as evidenced by 13% weight loss in 4 months.  Intervention: I educated patient on strategies to incorporate high-calorie, high-protein foods. Encouraged patient to purchase ready to eat or easy to prepare foods at the grocery store. Recommended patient begin strawberry Ensure Enlive between meals.   Patient will manage high blood sugars with his insulin. Provided samples and coupons.  Monitoring, evaluation, goals: Patient will tolerate increased calories and protein to minimize further weight loss.  Next visit: Monday, October 21 during infusion.  **Disclaimer: This note was dictated with voice recognition software. Similar sounding words can inadvertently be transcribed and this note may contain transcription errors which may not have been corrected upon publication of note.**

## 2018-04-19 ENCOUNTER — Telehealth: Payer: Self-pay | Admitting: Internal Medicine

## 2018-04-19 ENCOUNTER — Inpatient Hospital Stay: Payer: Medicare Other | Attending: Internal Medicine

## 2018-04-19 VITALS — BP 125/71 | HR 82 | Temp 97.8°F | Resp 16

## 2018-04-19 DIAGNOSIS — I714 Abdominal aortic aneurysm, without rupture: Secondary | ICD-10-CM | POA: Diagnosis not present

## 2018-04-19 DIAGNOSIS — Z8673 Personal history of transient ischemic attack (TIA), and cerebral infarction without residual deficits: Secondary | ICD-10-CM | POA: Insufficient documentation

## 2018-04-19 DIAGNOSIS — K8020 Calculus of gallbladder without cholecystitis without obstruction: Secondary | ICD-10-CM | POA: Insufficient documentation

## 2018-04-19 DIAGNOSIS — I252 Old myocardial infarction: Secondary | ICD-10-CM | POA: Diagnosis not present

## 2018-04-19 DIAGNOSIS — I9589 Other hypotension: Secondary | ICD-10-CM | POA: Diagnosis not present

## 2018-04-19 DIAGNOSIS — N189 Chronic kidney disease, unspecified: Secondary | ICD-10-CM | POA: Diagnosis not present

## 2018-04-19 DIAGNOSIS — C3481 Malignant neoplasm of overlapping sites of right bronchus and lung: Secondary | ICD-10-CM | POA: Insufficient documentation

## 2018-04-19 DIAGNOSIS — I129 Hypertensive chronic kidney disease with stage 1 through stage 4 chronic kidney disease, or unspecified chronic kidney disease: Secondary | ICD-10-CM | POA: Insufficient documentation

## 2018-04-19 DIAGNOSIS — E042 Nontoxic multinodular goiter: Secondary | ICD-10-CM | POA: Insufficient documentation

## 2018-04-19 DIAGNOSIS — M129 Arthropathy, unspecified: Secondary | ICD-10-CM | POA: Insufficient documentation

## 2018-04-19 DIAGNOSIS — C778 Secondary and unspecified malignant neoplasm of lymph nodes of multiple regions: Secondary | ICD-10-CM | POA: Diagnosis not present

## 2018-04-19 DIAGNOSIS — Z79899 Other long term (current) drug therapy: Secondary | ICD-10-CM | POA: Insufficient documentation

## 2018-04-19 DIAGNOSIS — N400 Enlarged prostate without lower urinary tract symptoms: Secondary | ICD-10-CM | POA: Insufficient documentation

## 2018-04-19 DIAGNOSIS — Z7982 Long term (current) use of aspirin: Secondary | ICD-10-CM | POA: Insufficient documentation

## 2018-04-19 DIAGNOSIS — I739 Peripheral vascular disease, unspecified: Secondary | ICD-10-CM | POA: Insufficient documentation

## 2018-04-19 DIAGNOSIS — I313 Pericardial effusion (noninflammatory): Secondary | ICD-10-CM | POA: Diagnosis not present

## 2018-04-19 DIAGNOSIS — Z87891 Personal history of nicotine dependence: Secondary | ICD-10-CM | POA: Insufficient documentation

## 2018-04-19 DIAGNOSIS — I2510 Atherosclerotic heart disease of native coronary artery without angina pectoris: Secondary | ICD-10-CM | POA: Diagnosis not present

## 2018-04-19 DIAGNOSIS — Z7689 Persons encountering health services in other specified circumstances: Secondary | ICD-10-CM | POA: Insufficient documentation

## 2018-04-19 DIAGNOSIS — C7951 Secondary malignant neoplasm of bone: Secondary | ICD-10-CM | POA: Insufficient documentation

## 2018-04-19 DIAGNOSIS — K219 Gastro-esophageal reflux disease without esophagitis: Secondary | ICD-10-CM | POA: Insufficient documentation

## 2018-04-19 DIAGNOSIS — J90 Pleural effusion, not elsewhere classified: Secondary | ICD-10-CM | POA: Insufficient documentation

## 2018-04-19 DIAGNOSIS — C3491 Malignant neoplasm of unspecified part of right bronchus or lung: Secondary | ICD-10-CM

## 2018-04-19 DIAGNOSIS — Z87442 Personal history of urinary calculi: Secondary | ICD-10-CM | POA: Insufficient documentation

## 2018-04-19 DIAGNOSIS — E1122 Type 2 diabetes mellitus with diabetic chronic kidney disease: Secondary | ICD-10-CM | POA: Insufficient documentation

## 2018-04-19 DIAGNOSIS — E785 Hyperlipidemia, unspecified: Secondary | ICD-10-CM | POA: Insufficient documentation

## 2018-04-19 DIAGNOSIS — E861 Hypovolemia: Secondary | ICD-10-CM | POA: Diagnosis not present

## 2018-04-19 DIAGNOSIS — Z5111 Encounter for antineoplastic chemotherapy: Secondary | ICD-10-CM | POA: Diagnosis not present

## 2018-04-19 DIAGNOSIS — Z794 Long term (current) use of insulin: Secondary | ICD-10-CM | POA: Insufficient documentation

## 2018-04-19 DIAGNOSIS — I700 Atherosclerosis of aorta: Secondary | ICD-10-CM | POA: Insufficient documentation

## 2018-04-19 DIAGNOSIS — C7972 Secondary malignant neoplasm of left adrenal gland: Secondary | ICD-10-CM | POA: Insufficient documentation

## 2018-04-19 DIAGNOSIS — K5730 Diverticulosis of large intestine without perforation or abscess without bleeding: Secondary | ICD-10-CM | POA: Diagnosis not present

## 2018-04-19 MED ORDER — SODIUM CHLORIDE 0.9 % IV SOLN
Freq: Once | INTRAVENOUS | Status: AC
  Administered 2018-04-19: 15:00:00 via INTRAVENOUS
  Filled 2018-04-19: qty 250

## 2018-04-19 MED ORDER — DEXAMETHASONE SODIUM PHOSPHATE 10 MG/ML IJ SOLN
INTRAMUSCULAR | Status: AC
  Filled 2018-04-19: qty 1

## 2018-04-19 MED ORDER — DEXAMETHASONE SODIUM PHOSPHATE 10 MG/ML IJ SOLN
10.0000 mg | Freq: Once | INTRAMUSCULAR | Status: AC
  Administered 2018-04-19: 10 mg via INTRAVENOUS

## 2018-04-19 MED ORDER — SODIUM CHLORIDE 0.9 % IV SOLN
100.0000 mg/m2 | Freq: Once | INTRAVENOUS | Status: AC
  Administered 2018-04-19: 190 mg via INTRAVENOUS
  Filled 2018-04-19: qty 9.5

## 2018-04-19 NOTE — Patient Instructions (Signed)
[facility] Discharge Instructions for Patients Receiving Chemotherapy  Today you received the following chemotherapy agents: Etoposide.  To help prevent nausea and vomiting after your treatment, we encourage you to take your nausea medication as directed.    If you develop nausea and vomiting that is not controlled by your nausea medication, call the clinic.   BELOW ARE SYMPTOMS THAT SHOULD BE REPORTED IMMEDIATELY:  *FEVER GREATER THAN 100.5 F  *CHILLS WITH OR WITHOUT FEVER  NAUSEA AND VOMITING THAT IS NOT CONTROLLED WITH YOUR NAUSEA MEDICATION  *UNUSUAL SHORTNESS OF BREATH  *UNUSUAL BRUISING OR BLEEDING  TENDERNESS IN MOUTH AND THROAT WITH OR WITHOUT PRESENCE OF ULCERS  *URINARY PROBLEMS  *BOWEL PROBLEMS  UNUSUAL RASH Items with * indicate a potential emergency and should be followed up as soon as possible.  Feel free to call the clinic should you have any questions or concerns. The clinic phone number is [phone].  Please show the [place] at check-in to the Emergency Department and triage nurse.

## 2018-04-19 NOTE — Telephone Encounter (Signed)
Patient notified regarding appts scheduled letter/calendar mailed per 9/30 los

## 2018-04-20 ENCOUNTER — Inpatient Hospital Stay: Payer: Medicare Other

## 2018-04-20 VITALS — BP 120/63 | HR 80 | Temp 98.6°F | Resp 17

## 2018-04-20 DIAGNOSIS — C3491 Malignant neoplasm of unspecified part of right bronchus or lung: Secondary | ICD-10-CM

## 2018-04-20 DIAGNOSIS — C3481 Malignant neoplasm of overlapping sites of right bronchus and lung: Secondary | ICD-10-CM | POA: Diagnosis not present

## 2018-04-20 MED ORDER — SODIUM CHLORIDE 0.9 % IV SOLN
100.0000 mg/m2 | Freq: Once | INTRAVENOUS | Status: AC
  Administered 2018-04-20: 190 mg via INTRAVENOUS
  Filled 2018-04-20: qty 9.5

## 2018-04-20 MED ORDER — SODIUM CHLORIDE 0.9 % IV SOLN
Freq: Once | INTRAVENOUS | Status: AC
  Administered 2018-04-20: 14:00:00 via INTRAVENOUS
  Filled 2018-04-20: qty 250

## 2018-04-20 MED ORDER — DEXAMETHASONE SODIUM PHOSPHATE 10 MG/ML IJ SOLN
INTRAMUSCULAR | Status: AC
  Filled 2018-04-20: qty 1

## 2018-04-20 MED ORDER — DEXAMETHASONE SODIUM PHOSPHATE 10 MG/ML IJ SOLN
10.0000 mg | Freq: Once | INTRAMUSCULAR | Status: AC
  Administered 2018-04-20: 10 mg via INTRAVENOUS

## 2018-04-20 NOTE — Patient Instructions (Signed)
[facility] Discharge Instructions for Patients Receiving Chemotherapy  Today you received the following chemotherapy agents Etoposide  To help prevent nausea and vomiting after your treatment, we encourage you to take your nausea medication as directed  If you develop nausea and vomiting that is not controlled by your nausea medication, call the clinic.   BELOW ARE SYMPTOMS THAT SHOULD BE REPORTED IMMEDIATELY:  *FEVER GREATER THAN 100.5 F  *CHILLS WITH OR WITHOUT FEVER  NAUSEA AND VOMITING THAT IS NOT CONTROLLED WITH YOUR NAUSEA MEDICATION  *UNUSUAL SHORTNESS OF BREATH  *UNUSUAL BRUISING OR BLEEDING  TENDERNESS IN MOUTH AND THROAT WITH OR WITHOUT PRESENCE OF ULCERS  *URINARY PROBLEMS  *BOWEL PROBLEMS  UNUSUAL RASH Items with * indicate a potential emergency and should be followed up as soon as possible.  Feel free to call the clinic should you have any questions or concerns. The clinic phone number is [phone].  Please show the [facility] at check-in to the Emergency Department and triage nurse.

## 2018-04-22 ENCOUNTER — Inpatient Hospital Stay: Payer: Medicare Other

## 2018-04-22 ENCOUNTER — Inpatient Hospital Stay (HOSPITAL_BASED_OUTPATIENT_CLINIC_OR_DEPARTMENT_OTHER): Payer: Medicare Other | Admitting: Medical

## 2018-04-22 ENCOUNTER — Inpatient Hospital Stay: Payer: Medicare Other | Admitting: Medical

## 2018-04-22 ENCOUNTER — Other Ambulatory Visit: Payer: Self-pay | Admitting: Medical

## 2018-04-22 VITALS — BP 82/47 | HR 92 | Temp 97.8°F | Resp 18

## 2018-04-22 VITALS — BP 122/80 | HR 81 | Temp 97.6°F | Resp 18 | Ht 68.0 in | Wt 155.0 lb

## 2018-04-22 DIAGNOSIS — M129 Arthropathy, unspecified: Secondary | ICD-10-CM

## 2018-04-22 DIAGNOSIS — C778 Secondary and unspecified malignant neoplasm of lymph nodes of multiple regions: Secondary | ICD-10-CM

## 2018-04-22 DIAGNOSIS — Z8673 Personal history of transient ischemic attack (TIA), and cerebral infarction without residual deficits: Secondary | ICD-10-CM

## 2018-04-22 DIAGNOSIS — C7972 Secondary malignant neoplasm of left adrenal gland: Secondary | ICD-10-CM

## 2018-04-22 DIAGNOSIS — J90 Pleural effusion, not elsewhere classified: Secondary | ICD-10-CM

## 2018-04-22 DIAGNOSIS — Z7689 Persons encountering health services in other specified circumstances: Secondary | ICD-10-CM

## 2018-04-22 DIAGNOSIS — K219 Gastro-esophageal reflux disease without esophagitis: Secondary | ICD-10-CM

## 2018-04-22 DIAGNOSIS — C3491 Malignant neoplasm of unspecified part of right bronchus or lung: Secondary | ICD-10-CM

## 2018-04-22 DIAGNOSIS — I129 Hypertensive chronic kidney disease with stage 1 through stage 4 chronic kidney disease, or unspecified chronic kidney disease: Secondary | ICD-10-CM

## 2018-04-22 DIAGNOSIS — C7951 Secondary malignant neoplasm of bone: Secondary | ICD-10-CM

## 2018-04-22 DIAGNOSIS — R531 Weakness: Secondary | ICD-10-CM

## 2018-04-22 DIAGNOSIS — I714 Abdominal aortic aneurysm, without rupture: Secondary | ICD-10-CM

## 2018-04-22 DIAGNOSIS — E785 Hyperlipidemia, unspecified: Secondary | ICD-10-CM

## 2018-04-22 DIAGNOSIS — Z5111 Encounter for antineoplastic chemotherapy: Secondary | ICD-10-CM | POA: Diagnosis not present

## 2018-04-22 DIAGNOSIS — Z87442 Personal history of urinary calculi: Secondary | ICD-10-CM

## 2018-04-22 DIAGNOSIS — I2510 Atherosclerotic heart disease of native coronary artery without angina pectoris: Secondary | ICD-10-CM

## 2018-04-22 DIAGNOSIS — I9589 Other hypotension: Secondary | ICD-10-CM

## 2018-04-22 DIAGNOSIS — I959 Hypotension, unspecified: Secondary | ICD-10-CM

## 2018-04-22 DIAGNOSIS — I313 Pericardial effusion (noninflammatory): Secondary | ICD-10-CM

## 2018-04-22 DIAGNOSIS — E1122 Type 2 diabetes mellitus with diabetic chronic kidney disease: Secondary | ICD-10-CM

## 2018-04-22 DIAGNOSIS — I739 Peripheral vascular disease, unspecified: Secondary | ICD-10-CM

## 2018-04-22 DIAGNOSIS — E861 Hypovolemia: Secondary | ICD-10-CM

## 2018-04-22 DIAGNOSIS — I700 Atherosclerosis of aorta: Secondary | ICD-10-CM

## 2018-04-22 DIAGNOSIS — C3481 Malignant neoplasm of overlapping sites of right bronchus and lung: Secondary | ICD-10-CM

## 2018-04-22 DIAGNOSIS — K8020 Calculus of gallbladder without cholecystitis without obstruction: Secondary | ICD-10-CM

## 2018-04-22 DIAGNOSIS — Z87891 Personal history of nicotine dependence: Secondary | ICD-10-CM

## 2018-04-22 DIAGNOSIS — N189 Chronic kidney disease, unspecified: Secondary | ICD-10-CM

## 2018-04-22 DIAGNOSIS — I252 Old myocardial infarction: Secondary | ICD-10-CM

## 2018-04-22 LAB — CMP (CANCER CENTER ONLY)
ALBUMIN: 3.1 g/dL — AB (ref 3.5–5.0)
ALT: 11 U/L (ref 0–44)
ANION GAP: 7 (ref 5–15)
AST: 19 U/L (ref 15–41)
Alkaline Phosphatase: 90 U/L (ref 38–126)
BILIRUBIN TOTAL: 0.5 mg/dL (ref 0.3–1.2)
BUN: 34 mg/dL — ABNORMAL HIGH (ref 8–23)
CALCIUM: 8.6 mg/dL — AB (ref 8.9–10.3)
CO2: 28 mmol/L (ref 22–32)
Chloride: 96 mmol/L — ABNORMAL LOW (ref 98–111)
Creatinine: 1.26 mg/dL — ABNORMAL HIGH (ref 0.61–1.24)
GFR, Estimated: 52 mL/min — ABNORMAL LOW (ref >60)
GLUCOSE: 187 mg/dL — AB (ref 70–99)
Potassium: 3.9 mmol/L (ref 3.5–5.1)
Sodium: 131 mmol/L — ABNORMAL LOW (ref 135–145)
TOTAL PROTEIN: 5.9 g/dL — AB (ref 6.5–8.1)

## 2018-04-22 LAB — CBC WITH DIFFERENTIAL (CANCER CENTER ONLY)
BASOS PCT: 1 %
Basophils Absolute: 0 10*3/uL (ref 0.0–0.1)
EOS ABS: 0 10*3/uL (ref 0.0–0.5)
Eosinophils Relative: 0 %
HEMATOCRIT: 27.8 % — AB (ref 38.4–49.9)
Hemoglobin: 9.5 g/dL — ABNORMAL LOW (ref 13.0–17.1)
Lymphocytes Relative: 15 %
Lymphs Abs: 0.9 10*3/uL (ref 0.9–3.3)
MCH: 30.3 pg (ref 27.2–33.4)
MCHC: 34.2 g/dL (ref 32.0–36.0)
MCV: 88.6 fL (ref 79.3–98.0)
MONO ABS: 0.1 10*3/uL (ref 0.1–0.9)
MONOS PCT: 1 %
NEUTROS ABS: 4.9 10*3/uL (ref 1.5–6.5)
Neutrophils Relative %: 83 %
Platelet Count: 386 10*3/uL (ref 140–400)
RBC: 3.14 MIL/uL — ABNORMAL LOW (ref 4.20–5.82)
RDW: 15 % — ABNORMAL HIGH (ref 11.0–14.6)
WBC Count: 5.9 10*3/uL (ref 4.0–10.3)

## 2018-04-22 MED ORDER — SODIUM CHLORIDE 0.9 % IV SOLN
Freq: Once | INTRAVENOUS | Status: AC
  Administered 2018-04-22: 13:00:00 via INTRAVENOUS
  Filled 2018-04-22: qty 250

## 2018-04-22 MED ORDER — PEGFILGRASTIM-CBQV 6 MG/0.6ML ~~LOC~~ SOSY
6.0000 mg | PREFILLED_SYRINGE | Freq: Once | SUBCUTANEOUS | Status: AC
  Administered 2018-04-22: 6 mg via SUBCUTANEOUS

## 2018-04-22 MED ORDER — PEGFILGRASTIM-CBQV 6 MG/0.6ML ~~LOC~~ SOSY
PREFILLED_SYRINGE | SUBCUTANEOUS | Status: AC
  Filled 2018-04-22: qty 0.6

## 2018-04-22 NOTE — Patient Instructions (Signed)
Dehydration, Adult Dehydration is when there is not enough fluid or water in your body. This happens when you lose more fluids than you take in. Dehydration can range from mild to very bad. It should be treated right away to keep it from getting very bad. Symptoms of mild dehydration may include:  Thirst.  Dry lips.  Slightly dry mouth.  Dry, warm skin.  Dizziness. Symptoms of moderate dehydration may include:  Very dry mouth.  Muscle cramps.  Dark pee (urine). Pee may be the color of tea.  Your body making less pee.  Your eyes making fewer tears.  Heartbeat that is uneven or faster than normal (palpitations).  Headache.  Light-headedness, especially when you stand up from sitting.  Fainting (syncope). Symptoms of very bad dehydration may include:  Changes in skin, such as: ? Cold and clammy skin. ? Blotchy (mottled) or pale skin. ? Skin that does not quickly return to normal after being lightly pinched and let go (poor skin turgor).  Changes in body fluids, such as: ? Feeling very thirsty. ? Your eyes making fewer tears. ? Not sweating when body temperature is high, such as in hot weather. ? Your body making very little pee.  Changes in vital signs, such as: ? Weak pulse. ? Pulse that is more than 100 beats a minute when you are sitting still. ? Fast breathing. ? Low blood pressure.  Other changes, such as: ? Sunken eyes. ? Cold hands and feet. ? Confusion. ? Lack of energy (lethargy). ? Trouble waking up from sleep. ? Short-term weight loss. ? Unconsciousness. Follow these instructions at home:  If told by your doctor, drink an ORS: ? Make an ORS by using instructions on the package. ? Start by drinking small amounts, about  cup (120 mL) every 5-10 minutes. ? Slowly drink more until you have had the amount that your doctor said to have.  Drink enough clear fluid to keep your pee clear or pale yellow. If you were told to drink an ORS, finish the ORS  first, then start slowly drinking clear fluids. Drink fluids such as: ? Water. Do not drink only water by itself. Doing that can make the salt (sodium) level in your body get too low (hyponatremia). ? Ice chips. ? Fruit juice that you have added water to (diluted). ? Low-calorie sports drinks.  Avoid: ? Alcohol. ? Drinks that have a lot of sugar. These include high-calorie sports drinks, fruit juice that does not have water added, and soda. ? Caffeine. ? Foods that are greasy or have a lot of fat or sugar.  Take over-the-counter and prescription medicines only as told by your doctor.  Do not take salt tablets. Doing that can make the salt level in your body get too high (hypernatremia).  Eat foods that have minerals (electrolytes). Examples include bananas, oranges, potatoes, tomatoes, and spinach.  Keep all follow-up visits as told by your doctor. This is important. Contact a doctor if:  You have belly (abdominal) pain that: ? Gets worse. ? Stays in one area (localizes).  You have a rash.  You have a stiff neck.  You get angry or annoyed more easily than normal (irritability).  You are more sleepy than normal.  You have a harder time waking up than normal.  You feel: ? Weak. ? Dizzy. ? Very thirsty.  You have peed (urinated) only a small amount of very dark pee during 6-8 hours. Get help right away if:  You have symptoms of   very bad dehydration.  You cannot drink fluids without throwing up (vomiting).  Your symptoms get worse with treatment.  You have a fever.  You have a very bad headache.  You are throwing up or having watery poop (diarrhea) and it: ? Gets worse. ? Does not go away.  You have blood or something green (bile) in your throw-up.  You have blood in your poop (stool). This may cause poop to look black and tarry.  You have not peed in 6-8 hours.  You pass out (faint).  Your heart rate when you are sitting still is more than 100 beats a  minute.  You have trouble breathing. This information is not intended to replace advice given to you by your health care provider. Make sure you discuss any questions you have with your health care provider. Document Released: 05/02/2009 Document Revised: 01/24/2016 Document Reviewed: 08/30/2015 Elsevier Interactive Patient Education  2018 [employer] Inc[employer]  

## 2018-04-22 NOTE — Progress Notes (Signed)
These preliminary result these preliminary results were noted.  Awaiting final report.

## 2018-04-24 ENCOUNTER — Other Ambulatory Visit: Payer: Self-pay | Admitting: Cardiovascular Disease

## 2018-04-25 ENCOUNTER — Other Ambulatory Visit: Payer: Medicare Other

## 2018-04-25 NOTE — Telephone Encounter (Signed)
Rx request sent to pharmacy.  

## 2018-04-25 NOTE — Progress Notes (Signed)
[facility] Progress Note   Arthur [name] [id number] Jun 11, 1941 77 y.o.  Arthur [name] is managed by Dr. Fanny Bien. Arthur [name]  Actively treated with chemotherapy/immunotherapy: yes  Current Therapy: Tecentriq, carboplatin, and etoposide with pegfilgrastim support.  Last Treated:  04/18/2018 (Cycle 2)  Assessment: Plan:    Hypotension due to hypovolemia - Plan: 0.9 %  sodium chloride infusion  Small cell lung cancer, right (HCC)   Hypotension: The patient was given 1 liter of normal saline IV today.  Extensive stage (T2b, N3, M1c) small cell lung cancer: Arthur [name] is status post cycle 2 of Tecentriq, carboplatin, and etoposide with pegfilgrastim support which was dosed on 04/18/2018.  Please see After Visit Summary for patient specific instructions.  Future Appointments  Date Time Provider [place]  05/09/2018 10:15 AM [facility] LAB 2 [facility] None  05/09/2018 10:45 AM Curt Bears, MD [facility] None  05/09/2018 11:45 AM [facility] INFUSION [facility] None  05/09/2018  2:30 PM Ernestene Kiel L, RD [facility] None  05/10/2018  1:00 PM [facility] INFUSION [facility] None  05/11/2018  1:00 PM [facility] INFUSION [facility] None  05/18/2018  9:45 AM Lorretta Harp, MD [facility]  05/30/2018  9:30 AM [facility] LAB 2 [facility] None  05/30/2018 10:00 AM Curt Bears, MD [facility] None  05/30/2018 11:00 AM [facility] INFUSION [facility] None  05/31/2018  1:00 PM [facility] INFUSION [facility] None  06/01/2018  1:00 PM [facility] INFUSION [facility] None  06/20/2018  9:30 AM [facility] LAB 6 [facility] None  06/20/2018 10:00 AM Curcio, Kristin R, NP [facility] None  06/20/2018 11:00 AM [facility] INFUSION [facility] None  06/21/2018 11:00 AM [facility] INFUSION [facility] None  06/22/2018 11:00 AM [facility] INFUSION [facility] None  07/14/2018  8:00 AM [facility] LAB 3 [facility] None  07/14/2018  8:15  AM [facility] [place] FLUSH [facility] None  07/14/2018  8:45 AM Curt Bears, MD [facility] None  07/14/2018 10:00 AM [facility] INFUSION [facility] None  07/15/2018  1:00 PM [facility] INFUSION [facility] None  07/16/2018 10:00 AM [facility] INFUSION [facility] None    No orders of the defined types were placed in this encounter.      Subjective:   Patient ID:  Arthur [name] is a 77 y.o. (DOB [birth year]) male.  Chief Complaint: No chief complaint on file.   HPI Arthur [name] is a 77 year old male with a history of an extensive stage (T2b, N3, M1c) small cell lung cancer who is managed by Dr. Fanny Bien. Arthur [name]. Arthur [name] is status post cycle 2 of Tecentriq, carboplatin, and etoposide with pegfilgrastim support which was dosed on 04/18/2018.  He was originally diagnosed in August 2019 when he presented with a large right upper lobe lung mass with extension to the right hilum.  Additionally was found to have supraclavicular lymphadenopathy and metastatic disease to the retroperitoneal lymph nodes and left adrenal gland.  He also was seen to have a suspicious right supraclavicular bony lesion.  He presented to the lab today for previously scheduled lab work.  This provider was asked to see him as he was acutely weak.  He reports that he has been eating and drinking but was noticing weakness with positional changes.  He denies fevers, chills, or sweats.  Medications: I have reviewed the patient's current medications.  Allergies:  Allergies  Allergen Reactions  . Codeine Swelling    Past Medical History:  Diagnosis Date  . Arthritis   . Ascending aortic aneurysm (HCC)    PER CT CHEST 03-08-18 IN [facility] , 4.0CM  .  Chronic kidney disease   . Claudication ([place])   . Coronary artery disease 1997   CFX PCI, '97. Mod residual RI and RCA dis.  . Diabetes mellitus ([place])    IDDM  . Dyslipidemia   . Dyspnea   . GERD (gastroesophageal reflux disease)   . Hard of hearing   . History of  bronchitis   . History of chemotherapy   . History of kidney stones 1958  . HTN (hypertension)   . Lacunar stroke ([place])   . Metastatic lung cancer (metastasis from lung to other site), unspecified laterality ([place])    DIAGNOSED 02-2018, MANAGED BY DR Clinton  . NSTEMI (non-ST elevated myocardial infarction) ([place])   . PAD (peripheral artery disease) ([place])   . Pneumonia   . Tobacco abuse   . Urinary incontinence   . Wears glasses     Past Surgical History:  Procedure Laterality Date  . APPENDECTOMY     "busted on me"  . COLONOSCOPY    . CORONARY ANGIOPLASTY  1997   CFX  . CYSTOSCOPY WITH RETROGRADE PYELOGRAM, URETEROSCOPY AND STENT PLACEMENT Right 04/13/2018   Procedure: CYSTOSCOPY WITH RETROGRADE PYELOGRAM, URETEROSCOPY AND STENT PLACEMENT;  Surgeon: Alexis Frock, MD;  Location: [facility];  Service: Urology;  Laterality: Right;  1 HR  . ENDARTERECTOMY FEMORAL Right 05/13/2016   iliofemoral endarterectomy with bovine pericardial patch angioplasty  . ENDARTERECTOMY FEMORAL Right 05/13/2016   Procedure: RIGHT ENDARTERECTOMY FEMORAL;  Surgeon: Serafina Mitchell, MD;  Location: [facility];  Service: Vascular;  Laterality: Right;  . HOLMIUM LASER APPLICATION Right 10/02/1759   Procedure: HOLMIUM LASER APPLICATION;  Surgeon: Alexis Frock, MD;  Location: [facility];  Service: Urology;  Laterality: Right;  . PATCH ANGIOPLASTY Right 05/13/2016   Procedure: [place];  Surgeon: Serafina Mitchell, MD;  Location: [facility];  Service: Vascular;  Laterality: Right;  . PERIPHERAL VASCULAR CATHETERIZATION N/A 02/20/2016   Procedure: Lower Extremity Angiography;  Surgeon: Lorretta Harp, MD;  Location: [facility];  Service: Cardiovascular;  Laterality: N/A;    Family History  Problem Relation Age of Onset  . Diabetes Sister   . Arthritis Mother   . Heart disease Father     Social History   Socioeconomic History  . Marital status: Married     Spouse name: Not on file  . Number of children: Not on file  . Years of education: Not on file  . Highest education level: Not on file  Occupational History  . Not on file  Social Needs  . Financial resource strain: Not on file  . Food insecurity:    Worry: Not on file    Inability: Not on file  . Transportation needs:    Medical: Not on file    Non-medical: Not on file  Tobacco Use  . Smoking status: Former Smoker    Packs/day: 0.25    Years: 65.00    Pack years: 16.25    Types: Cigarettes  . Smokeless tobacco: Never Used  . Tobacco comment: 05/13/2016 "stopped smoking ~ 1 month ago"  Substance and Sexual Activity  . Alcohol use: No  . Drug use: No  . Sexual activity: Not on file  Lifestyle  . Physical activity:    Days per week: Not on file    Minutes per session: Not on file  . Stress: Not on file  Relationships  . Social connections:    Talks on phone: Not on file  Gets together: Not on file    Attends religious service: Not on file    Active member of club or organization: Not on file    Attends meetings of clubs or organizations: Not on file    Relationship status: Not on file  . Intimate partner violence:    Fear of current or ex partner: Not on file    Emotionally abused: Not on file    Physically abused: Not on file    Forced sexual activity: Not on file  Other Topics Concern  . Not on file  Social History Narrative   Retired from [employer]. Married 2 children, 11 grand children, 6 great grand children    Past Medical History, Surgical history, Social history, and Family history were reviewed and updated as appropriate.   Please see review of systems for further details on the patient's review from today.   Review of Systems:  Review of Systems  Constitutional: Negative for chills, diaphoresis, fatigue and fever.  HENT: Negative for congestion, postnasal drip, rhinorrhea and sore throat.   Respiratory: Positive for cough. Negative for shortness of  breath and wheezing.   Cardiovascular: Negative for palpitations.  Neurological: Positive for weakness. Negative for headaches.    Objective:   Physical Exam:  BP 122/80   Pulse 81   Temp 97.6 F (36.4 C) (Axillary)   Resp 18   Ht 5\' 8"  (1.727 m)   Wt 155 lb (70.3 kg)   SpO2 100%   BMI 23.57 kg/m  ECOG: 0  Physical Exam  Constitutional: No distress.  HENT:  Head: Normocephalic and atraumatic.  Right Ear: External ear normal.  Left Ear: External ear normal.  Mouth/Throat: Oropharynx is clear and moist. No oropharyngeal exudate.  Neck: Normal range of motion. Neck supple.  Cardiovascular: Normal rate, regular rhythm and normal heart sounds. Exam reveals no gallop and no friction rub.  No murmur heard. Pulmonary/Chest: Effort normal and breath sounds normal. No respiratory distress. He has no wheezes. He has no rales.  Lymphadenopathy:    He has no cervical adenopathy.  Neurological: He is alert. Coordination normal.  Skin: Skin is warm and dry. No rash noted. He is not diaphoretic. No erythema.  Psychiatric: He has a normal mood and affect. His behavior is normal. Judgment and thought content normal.    Lab Review:     Component Value Date/Time   NA 131 (L) 04/22/2018 1219   NA 133 (L) 03/30/2018 0829   K 3.9 04/22/2018 1219   CL 96 (L) 04/22/2018 1219   CO2 28 04/22/2018 1219   GLUCOSE 187 (H) 04/22/2018 1219   BUN 34 (H) 04/22/2018 1219   BUN 31 (H) 03/30/2018 0829   CREATININE 1.26 (H) 04/22/2018 1219   CREATININE 1.46 (H) 02/27/2016 0820   CALCIUM 8.6 (L) 04/22/2018 1219   PROT 5.9 (L) 04/22/2018 1219   ALBUMIN 3.1 (L) 04/22/2018 1219   AST 19 04/22/2018 1219   ALT 11 04/22/2018 1219   ALKPHOS 90 04/22/2018 1219   BILITOT 0.5 04/22/2018 1219   GFRNONAA 52 (L) 04/22/2018 1219   GFRAA >60 04/22/2018 1219       Component Value Date/Time   WBC 5.9 04/22/2018 1219   WBC 10.3 03/30/2018 1400   RBC 3.14 (L) 04/22/2018 1219   HGB 9.5 (L) 04/22/2018 1219    HCT 27.8 (L) 04/22/2018 1219   PLT 386 04/22/2018 1219   MCV 88.6 04/22/2018 1219   MCH 30.3 04/22/2018 1219   MCHC 34.2 04/22/2018  1219   RDW 15.0 (H) 04/22/2018 1219   LYMPHSABS 0.9 04/22/2018 1219   MONOABS 0.1 04/22/2018 1219   EOSABS 0.0 04/22/2018 1219   BASOSABS 0.0 04/22/2018 1219   -------------------------------  Imaging from last 24 hours (if applicable):  Radiology interpretation: Arthur [name] FK Contrast  Result Date: 03/31/2018 CLINICAL DATA:  Small-cell lung cancer staging. EXAM: MRI HEAD WITHOUT AND WITH CONTRAST TECHNIQUE: Multiplanar, multiecho pulse sequences of the brain and surrounding structures were obtained without and with intravenous contrast. CONTRAST:  7 mL Gadavist COMPARISON:  02/10/2017 FINDINGS: Brain: There is no evidence of acute infarct, intracranial hemorrhage, mass, midline shift, or extra-axial fluid collection. There is mild to moderate cerebral atrophy. Chronic lacunar infarcts are present in the corona radiata bilaterally, left basal ganglia, left periatrial white matter, and right pons, unchanged. Additional patchy T2 hyperintensities in the cerebral white matter and pons are nonspecific but compatible with moderate chronic small vessel ischemic disease, progressed in the pons from the prior study with a new chronic lacunar infarct on the left. No abnormal enhancement is identified. Vascular: Major intracranial vascular flow voids are preserved. Skull and upper cervical spine: No suspicious calvarial lesion. Extensive marrow abnormality with heterogeneous enhancement involving the C2 vertebral body and portions of the dens and posterior elements, new from the prior study. Suspected new marrow abnormality in the included superior portion of the C3 vertebral body is well. Sinuses/Orbits: Unremarkable orbits. Minimal scattered paranasal sinus mucosal thickening. Clear mastoid air cells. Other: None. IMPRESSION: 1. No evidence of intracranial metastases. 2. Bone  metastases in the upper cervical spine. 3. Mild-to-moderate chronic small vessel ischemic disease and cerebral atrophy. Electronically Signed   By: Logan Bores M.D.   On: 03/31/2018 16:46   Nm Pet Image Initial (pi) Skull Base To Thigh  Result Date: 03/31/2018 CLINICAL DATA:  Initial treatment strategy for small cell lung cancer. EXAM: NUCLEAR MEDICINE PET SKULL BASE TO THIGH TECHNIQUE: 8.5 mCi F-18 FDG was injected intravenously. Full-ring PET imaging was performed from the skull base to thigh after the radiotracer. CT data was obtained and used for attenuation correction and anatomic localization. Fasting blood glucose: 131 mg/dl COMPARISON:  CT chest 03/08/2018 and CT abdomen pelvis 02/25/2018. FINDINGS: Mediastinal blood pool activity: SUV max 2.5 NECK: There are bulky hypermetabolic lymph nodes in low internal jugular stations bilaterally, measuring up to 2.5 cm on the right, with an SUV max 7 8. Incidental CT findings: None. CHEST: Bulky hypermetabolic mediastinal and right hilar adenopathy. Index low right paratracheal lymph node measures 2.9 cm with an SUV max of 8 4. Impacted bronchi and soft tissue in the right perihilar region have an SUV max of 6.5. Extrapleural soft tissue in the medial mid left hemithorax measures approximately 3.3 cm (CT image 83) with an SUV max 6.3. Incidental CT findings: Atherosclerotic calcification of the arterial vasculature, including extensive three-vessel involvement of the coronary arteries. Heart is enlarged. Small pericardial effusion. Small bilateral pleural effusions, right greater than left. ABDOMEN/PELVIS: Bilateral hypermetabolic adrenal metastases measure up to 2.5 cm on the left with an SUV max of 5.5. There are numerous soft tissue nodules in the abdomen, which are predominantly retroperitoneal in location. Index left perirenal nodule measures 2.7 cm (CT image 119) with an SUV max of 4.6. No abnormal hypermetabolism in the liver, pancreas or spleen.  Incidental CT findings: Liver is unremarkable. Numerous stones are seen in the gallbladder. Kidneys, spleen, pancreas, stomach and bowel are grossly unremarkable. Atherosclerotic calcification of the arterial vasculature. Prostate  is nodular. SKELETON: There are hypermetabolic osseous metastases. Index lesion in the right scapula has an associated pathologic fracture, with an SUV max 5.4. There are scattered subcutaneous hypermetabolic nodules. Index nodule overlying the upper thoracic spine measures 1.6 cm (CT image 53) with an SUV max of 4.4. Incidental CT findings: Degenerative changes in the spine. IMPRESSION: 1. Widespread metastatic small-cell lung cancer involving low neck/intrathoracic lymph nodes, right upper lobe, left extrapleural space, adrenal glands, retroperitoneum, subcutaneous nodules and bones. 2. Pathologic right scapular fracture. 3. 4. Aortic atherosclerosis (ICD10-170.0). Extensive 3 vessel coronary artery calcification. 5. Cholelithiasis. 6. Nodular prostate. Electronically Signed   By: Lorin Picket M.D.   On: 03/31/2018 15:41   Dg C-arm 1-60 Min-no Report  Result Date: 04/13/2018 Fluoroscopy was utilized by the requesting physician.  No radiographic interpretation.

## 2018-04-25 NOTE — Progress Notes (Signed)
These preliminary result these preliminary results were noted.  Awaiting final report.

## 2018-04-27 LAB — CULTURE, BLOOD (SINGLE)
CULTURE: NO GROWTH
CULTURE: NO GROWTH
Special Requests: ADEQUATE

## 2018-04-27 NOTE — Progress Notes (Signed)
These preliminary result these preliminary results were noted.  Awaiting final report.

## 2018-05-02 ENCOUNTER — Other Ambulatory Visit: Payer: Medicare Other

## 2018-05-03 ENCOUNTER — Ambulatory Visit: Payer: Medicare Other

## 2018-05-06 ENCOUNTER — Ambulatory Visit (HOSPITAL_COMMUNITY)
Admission: RE | Admit: 2018-05-06 | Discharge: 2018-05-06 | Disposition: A | Discharge status: Home / Self Care | Payer: Medicare Other | Source: Ambulatory Visit | Attending: Oncology | Admitting: Oncology

## 2018-05-06 DIAGNOSIS — I2510 Atherosclerotic heart disease of native coronary artery without angina pectoris: Secondary | ICD-10-CM | POA: Insufficient documentation

## 2018-05-06 DIAGNOSIS — C3491 Malignant neoplasm of unspecified part of right bronchus or lung: Secondary | ICD-10-CM | POA: Diagnosis present

## 2018-05-06 DIAGNOSIS — J90 Pleural effusion, not elsewhere classified: Secondary | ICD-10-CM | POA: Insufficient documentation

## 2018-05-06 DIAGNOSIS — K8020 Calculus of gallbladder without cholecystitis without obstruction: Secondary | ICD-10-CM | POA: Diagnosis not present

## 2018-05-06 DIAGNOSIS — I700 Atherosclerosis of aorta: Secondary | ICD-10-CM | POA: Diagnosis not present

## 2018-05-06 DIAGNOSIS — C7951 Secondary malignant neoplasm of bone: Secondary | ICD-10-CM | POA: Diagnosis not present

## 2018-05-06 MED ORDER — IOHEXOL 300 MG/ML  SOLN
30.0000 mL | Freq: Once | INTRAMUSCULAR | Status: AC | PRN
  Administered 2018-05-06: 30 mL via ORAL

## 2018-05-06 MED ORDER — SODIUM CHLORIDE 0.9 % IJ SOLN
INTRAMUSCULAR | Status: AC
  Filled 2018-05-06: qty 50

## 2018-05-06 MED ORDER — IOHEXOL 300 MG/ML  SOLN
100.0000 mL | Freq: Once | INTRAMUSCULAR | Status: AC | PRN
  Administered 2018-05-06: 100 mL via INTRAVENOUS

## 2018-05-09 ENCOUNTER — Inpatient Hospital Stay: Payer: Medicare Other

## 2018-05-09 ENCOUNTER — Telehealth: Payer: Self-pay | Admitting: Internal Medicine

## 2018-05-09 ENCOUNTER — Inpatient Hospital Stay: Payer: Medicare Other | Admitting: Nutrition

## 2018-05-09 ENCOUNTER — Encounter: Payer: Self-pay | Admitting: Internal Medicine

## 2018-05-09 ENCOUNTER — Inpatient Hospital Stay (HOSPITAL_BASED_OUTPATIENT_CLINIC_OR_DEPARTMENT_OTHER): Payer: Medicare Other | Admitting: Internal Medicine

## 2018-05-09 VITALS — BP 125/63 | HR 95 | Temp 98.4°F | Resp 18 | Ht 68.0 in | Wt 158.8 lb

## 2018-05-09 DIAGNOSIS — Z5111 Encounter for antineoplastic chemotherapy: Secondary | ICD-10-CM

## 2018-05-09 DIAGNOSIS — N189 Chronic kidney disease, unspecified: Secondary | ICD-10-CM

## 2018-05-09 DIAGNOSIS — C7972 Secondary malignant neoplasm of left adrenal gland: Secondary | ICD-10-CM

## 2018-05-09 DIAGNOSIS — Z87891 Personal history of nicotine dependence: Secondary | ICD-10-CM

## 2018-05-09 DIAGNOSIS — Z794 Long term (current) use of insulin: Secondary | ICD-10-CM

## 2018-05-09 DIAGNOSIS — C7951 Secondary malignant neoplasm of bone: Secondary | ICD-10-CM

## 2018-05-09 DIAGNOSIS — Z8673 Personal history of transient ischemic attack (TIA), and cerebral infarction without residual deficits: Secondary | ICD-10-CM

## 2018-05-09 DIAGNOSIS — C3491 Malignant neoplasm of unspecified part of right bronchus or lung: Secondary | ICD-10-CM

## 2018-05-09 DIAGNOSIS — I739 Peripheral vascular disease, unspecified: Secondary | ICD-10-CM

## 2018-05-09 DIAGNOSIS — R5382 Chronic fatigue, unspecified: Secondary | ICD-10-CM

## 2018-05-09 DIAGNOSIS — I9589 Other hypotension: Secondary | ICD-10-CM

## 2018-05-09 DIAGNOSIS — C778 Secondary and unspecified malignant neoplasm of lymph nodes of multiple regions: Secondary | ICD-10-CM

## 2018-05-09 DIAGNOSIS — C3481 Malignant neoplasm of overlapping sites of right bronchus and lung: Secondary | ICD-10-CM

## 2018-05-09 DIAGNOSIS — Z720 Tobacco use: Secondary | ICD-10-CM

## 2018-05-09 DIAGNOSIS — I129 Hypertensive chronic kidney disease with stage 1 through stage 4 chronic kidney disease, or unspecified chronic kidney disease: Secondary | ICD-10-CM

## 2018-05-09 DIAGNOSIS — K219 Gastro-esophageal reflux disease without esophagitis: Secondary | ICD-10-CM

## 2018-05-09 DIAGNOSIS — Z7982 Long term (current) use of aspirin: Secondary | ICD-10-CM

## 2018-05-09 DIAGNOSIS — E785 Hyperlipidemia, unspecified: Secondary | ICD-10-CM

## 2018-05-09 DIAGNOSIS — N400 Enlarged prostate without lower urinary tract symptoms: Secondary | ICD-10-CM

## 2018-05-09 DIAGNOSIS — Z87442 Personal history of urinary calculi: Secondary | ICD-10-CM

## 2018-05-09 DIAGNOSIS — I2510 Atherosclerotic heart disease of native coronary artery without angina pectoris: Secondary | ICD-10-CM

## 2018-05-09 DIAGNOSIS — I252 Old myocardial infarction: Secondary | ICD-10-CM

## 2018-05-09 DIAGNOSIS — K8020 Calculus of gallbladder without cholecystitis without obstruction: Secondary | ICD-10-CM

## 2018-05-09 DIAGNOSIS — Z5112 Encounter for antineoplastic immunotherapy: Secondary | ICD-10-CM

## 2018-05-09 DIAGNOSIS — I10 Essential (primary) hypertension: Secondary | ICD-10-CM

## 2018-05-09 DIAGNOSIS — I700 Atherosclerosis of aorta: Secondary | ICD-10-CM

## 2018-05-09 DIAGNOSIS — M129 Arthropathy, unspecified: Secondary | ICD-10-CM

## 2018-05-09 DIAGNOSIS — E1122 Type 2 diabetes mellitus with diabetic chronic kidney disease: Secondary | ICD-10-CM

## 2018-05-09 DIAGNOSIS — K5730 Diverticulosis of large intestine without perforation or abscess without bleeding: Secondary | ICD-10-CM

## 2018-05-09 DIAGNOSIS — J90 Pleural effusion, not elsewhere classified: Secondary | ICD-10-CM

## 2018-05-09 DIAGNOSIS — I313 Pericardial effusion (noninflammatory): Secondary | ICD-10-CM

## 2018-05-09 DIAGNOSIS — Z7689 Persons encountering health services in other specified circumstances: Secondary | ICD-10-CM

## 2018-05-09 DIAGNOSIS — E861 Hypovolemia: Secondary | ICD-10-CM

## 2018-05-09 DIAGNOSIS — I714 Abdominal aortic aneurysm, without rupture: Secondary | ICD-10-CM

## 2018-05-09 DIAGNOSIS — Z79899 Other long term (current) drug therapy: Secondary | ICD-10-CM

## 2018-05-09 DIAGNOSIS — E042 Nontoxic multinodular goiter: Secondary | ICD-10-CM

## 2018-05-09 LAB — CBC WITH DIFFERENTIAL (CANCER CENTER ONLY)
ABS IMMATURE GRANULOCYTES: 0.69 10*3/uL — AB (ref 0.00–0.07)
Basophils Absolute: 0.1 10*3/uL (ref 0.0–0.1)
Basophils Relative: 0 %
EOS PCT: 0 %
Eosinophils Absolute: 0 10*3/uL (ref 0.0–0.5)
HEMATOCRIT: 24.4 % — AB (ref 39.0–52.0)
HEMOGLOBIN: 8 g/dL — AB (ref 13.0–17.0)
Immature Granulocytes: 4 %
LYMPHS PCT: 9 %
Lymphs Abs: 1.6 10*3/uL (ref 0.7–4.0)
MCH: 30.4 pg (ref 26.0–34.0)
MCHC: 32.8 g/dL (ref 30.0–36.0)
MCV: 92.8 fL (ref 80.0–100.0)
MONOS PCT: 7 %
Monocytes Absolute: 1.3 10*3/uL — ABNORMAL HIGH (ref 0.1–1.0)
NEUTROS ABS: 14.3 10*3/uL — AB (ref 1.7–7.7)
Neutrophils Relative %: 80 %
Platelet Count: 299 10*3/uL (ref 150–400)
RBC: 2.63 MIL/uL — ABNORMAL LOW (ref 4.22–5.81)
RDW: 19.9 % — ABNORMAL HIGH (ref 11.5–15.5)
WBC: 18 10*3/uL — AB (ref 4.0–10.5)
nRBC: 0.3 % — ABNORMAL HIGH (ref 0.0–0.2)

## 2018-05-09 LAB — CMP (CANCER CENTER ONLY)
ALBUMIN: 3.3 g/dL — AB (ref 3.5–5.0)
ALT: 19 U/L (ref 0–44)
AST: 21 U/L (ref 15–41)
Alkaline Phosphatase: 121 U/L (ref 38–126)
Anion gap: 9 (ref 5–15)
BUN: 12 mg/dL (ref 8–23)
CHLORIDE: 103 mmol/L (ref 98–111)
CO2: 24 mmol/L (ref 22–32)
CREATININE: 1.15 mg/dL (ref 0.61–1.24)
Calcium: 9 mg/dL (ref 8.9–10.3)
GFR, Estimated: 59 mL/min — ABNORMAL LOW (ref >60)
GLUCOSE: 176 mg/dL — AB (ref 70–99)
Potassium: 4 mmol/L (ref 3.5–5.1)
SODIUM: 136 mmol/L (ref 135–145)
Total Bilirubin: 0.4 mg/dL (ref 0.3–1.2)
Total Protein: 6.4 g/dL — ABNORMAL LOW (ref 6.5–8.1)

## 2018-05-09 LAB — TSH: TSH: 1.516 u[IU]/mL (ref 0.320–4.118)

## 2018-05-09 MED ORDER — SODIUM CHLORIDE 0.9 % IV SOLN
1200.0000 mg | Freq: Once | INTRAVENOUS | Status: AC
  Administered 2018-05-09: 1200 mg via INTRAVENOUS
  Filled 2018-05-09: qty 20

## 2018-05-09 MED ORDER — SODIUM CHLORIDE 0.9 % IV SOLN
402.0000 mg | Freq: Once | INTRAVENOUS | Status: AC
  Administered 2018-05-09: 400 mg via INTRAVENOUS
  Filled 2018-05-09: qty 40

## 2018-05-09 MED ORDER — SODIUM CHLORIDE 0.9 % IV SOLN
100.0000 mg/m2 | Freq: Once | INTRAVENOUS | Status: AC
  Administered 2018-05-09: 190 mg via INTRAVENOUS
  Filled 2018-05-09: qty 9.5

## 2018-05-09 MED ORDER — SODIUM CHLORIDE 0.9 % IV SOLN
Freq: Once | INTRAVENOUS | Status: AC
  Administered 2018-05-09: 13:00:00 via INTRAVENOUS
  Filled 2018-05-09: qty 5

## 2018-05-09 MED ORDER — PALONOSETRON HCL INJECTION 0.25 MG/5ML
0.2500 mg | Freq: Once | INTRAVENOUS | Status: AC
  Administered 2018-05-09: 0.25 mg via INTRAVENOUS

## 2018-05-09 MED ORDER — SODIUM CHLORIDE 0.9 % IV SOLN
Freq: Once | INTRAVENOUS | Status: AC
  Administered 2018-05-09: 12:00:00 via INTRAVENOUS
  Filled 2018-05-09: qty 250

## 2018-05-09 NOTE — Patient Instructions (Signed)
[facility] Discharge Instructions for Patients Receiving Chemotherapy  Today you received the following chemotherapy agents Tecentriq,  Carboplatin, Etoposide  To help prevent nausea and vomiting after your treatment, we encourage you to take your nausea medication as directed   If you develop nausea and vomiting that is not controlled by your nausea medication, call the clinic.   BELOW ARE SYMPTOMS THAT SHOULD BE REPORTED IMMEDIATELY:  *FEVER GREATER THAN 100.5 F  *CHILLS WITH OR WITHOUT FEVER  NAUSEA AND VOMITING THAT IS NOT CONTROLLED WITH YOUR NAUSEA MEDICATION  *UNUSUAL SHORTNESS OF BREATH  *UNUSUAL BRUISING OR BLEEDING  TENDERNESS IN MOUTH AND THROAT WITH OR WITHOUT PRESENCE OF ULCERS  *URINARY PROBLEMS  *BOWEL PROBLEMS  UNUSUAL RASH Items with * indicate a potential emergency and should be followed up as soon as possible.  Feel free to call the clinic should you have any questions or concerns. The clinic phone number is [phone].  Please show the [facility] at check-in to the Emergency Department and triage nurse.

## 2018-05-09 NOTE — Progress Notes (Signed)
[place] [phone]   [phone]  OFFICE PROGRESS NOTE  Arthur Housekeeper, MD Arthur [name] [id number]  DIAGNOSIS: Extensive stage (T2b, N3, M1c) small cell lung cancer, presented with large right upper lobe lung mass with extension to the right hilum in addition to the external and supraclavicular lymphadenopathy as well as metastatic disease to the retroperitoneal lymph nodes and left adrenal gland and suspicious right scapular bone lesion diagnosed in August 2019  PRIOR THERAPY: None.  CURRENT THERAPY: Systemic chemotherapy with carboplatin for AUC of 5 on day 1, etoposide 100 mg/M2 on days 1, 2 and 3 as well as Tecentriq 1200 mg IV every 3 weeks with Neulasta support.  First dose 03/28/2018.  Status post 2 cycles.  INTERVAL HISTORY: Arthur [name] 77 y.o. male returns to the clinic today for follow-up visit accompanied by his son-in-law.  The patient is feeling much better today with no concerning complaints.  He denied having any chest pain, shortness breath, cough or hemoptysis.  He continues to have mild fatigue.  He denied having any nausea, vomiting, diarrhea or constipation.  He has no significant weight loss or night sweats.  He continues to tolerate his treatment with systemic chemotherapy fairly well.  The patient had repeat CT scan of the chest, abdomen and pelvis performed recently and he is here for evaluation and discussion of his discuss results.  MEDICAL HISTORY: Past Medical History:  Diagnosis Date  . Arthritis   . Ascending aortic aneurysm (HCC)    PER CT CHEST 03-08-18 IN [facility] , 4.0CM  . Chronic kidney disease   . Claudication ([place])   . Coronary artery disease 1997   CFX PCI, '97. Mod residual RI and RCA dis.  . Diabetes mellitus ([place])    IDDM  . Dyslipidemia   . Dyspnea   . GERD (gastroesophageal reflux disease)   . Hard of hearing   . History of bronchitis   . History of chemotherapy   . History of  kidney stones 1958  . HTN (hypertension)   . Lacunar stroke ([place])   . Metastatic lung cancer (metastasis from lung to other site), unspecified laterality ([place])    DIAGNOSED 02-2018, MANAGED BY DR Ovilla  . NSTEMI (non-ST elevated myocardial infarction) ([place])   . PAD (peripheral artery disease) ([place])   . Pneumonia   . Tobacco abuse   . Urinary incontinence   . Wears glasses     ALLERGIES:  is allergic to codeine.  MEDICATIONS:  Current Outpatient Medications  Medication Sig Dispense Refill  . amLODipine (NORVASC) 5 MG tablet Take 1 tablet (5 mg total) by mouth daily. 180 tablet 3  . aspirin EC 81 MG EC tablet Take 1 tablet (81 mg total) by mouth daily. 30 tablet 1  . atorvastatin (LIPITOR) 20 MG tablet TAKE 1 TABLET (20 MG TOTAL) BY MOUTH DAILY. (Patient taking differently: Take 20 mg by mouth daily at 6 PM. ) 90 tablet 2  . clopidogrel (PLAVIX) 75 MG tablet Take 1 tablet (75 mg total) by mouth daily. 30 tablet 1  . docusate sodium (COLACE) 100 MG capsule Take 100 mg by mouth 2 (two) times daily.    Marland Kitchen doxazosin (CARDURA) 1 MG tablet Take 3 mg by mouth at bedtime. Pt takes 3 at bedtime     . fenofibrate 160 MG tablet TAKE 1 TABLET (160 MG TOTAL) BY MOUTH DAILY. 30 tablet 6  . fish oil-omega-3 fatty  acids 1000 MG capsule Take 2 g by mouth daily.     Marland Kitchen glucose blood (ONETOUCH VERIO) test strip CHECK BLOOD SUGARS 3 DAILY:DX 250.40 One touch verio    . insulin lispro (HUMALOG) 100 UNIT/ML injection Inject 0.35 mLs (35 Units total) into the skin 2 (two) times daily. (Patient taking differently: Inject 25-40 Units into the skin 2 (two) times daily at 10 AM and 5 PM. ) 10 mL 11  . isosorbide mononitrate (IMDUR) 30 MG 24 hr tablet Take 1 tablet (30 mg total) by mouth daily. 90 tablet 1  . meloxicam (MOBIC) 7.5 MG tablet Take 7.5 mg by mouth daily.    . metoprolol succinate (TOPROL-XL) 50 MG 24 hr tablet Take 1 tablet (50 mg total) by mouth daily. Take with or immediately  following a meal. 90 tablet 1  . metoprolol tartrate (LOPRESSOR) 50 MG tablet TAKE 1 TABLET (50 MG TOTAL) BY MOUTH 2 (TWO) TIMES DAILY. KEEP OV. 180 tablet 0  . Misc. Devices MISC 1 wheelchair for mobility.    . nitroGLYCERIN (NITROSTAT) 0.4 MG SL tablet Place 1 tablet (0.4 mg total) under the tongue every 5 (five) minutes x 3 doses as needed for chest pain. 25 tablet 4  . oxybutynin (DITROPAN) 5 MG tablet Take 5 mg by mouth daily.     . pantoprazole (PROTONIX) 40 MG tablet Take 1 tablet (40 mg total) by mouth daily. 30 tablet 1  . polyethylene glycol (MIRALAX / GLYCOLAX) packet Take 17 g by mouth 2 (two) times daily.    . prochlorperazine (COMPAZINE) 10 MG tablet Take 1 tablet (10 mg total) by mouth every 6 (six) hours as needed for nausea or vomiting. 30 tablet 0  . senna-docusate (SENOKOT-S) 8.6-50 MG tablet Take 1 tablet by mouth 2 (two) times daily. While taking strong pain meds to prevent constipation 20 tablet 0  . Sennosides (EX-LAX PO) Take 2 tablets by mouth daily as needed (constipation).    . traMADol (ULTRAM) 50 MG tablet Take 1-2 tablets (50-100 mg total) by mouth every 6 (six) hours as needed for moderate pain or severe pain. Post-operatively 20 tablet 0   No current facility-administered medications for this visit.     SURGICAL HISTORY:  Past Surgical History:  Procedure Laterality Date  . APPENDECTOMY     "busted on me"  . COLONOSCOPY    . CORONARY ANGIOPLASTY  1997   CFX  . CYSTOSCOPY WITH RETROGRADE PYELOGRAM, URETEROSCOPY AND STENT PLACEMENT Right 04/13/2018   Procedure: CYSTOSCOPY WITH RETROGRADE PYELOGRAM, URETEROSCOPY AND STENT PLACEMENT;  Surgeon: Alexis Frock, MD;  Location: [facility];  Service: Urology;  Laterality: Right;  1 HR  . ENDARTERECTOMY FEMORAL Right 05/13/2016   iliofemoral endarterectomy with bovine pericardial patch angioplasty  . ENDARTERECTOMY FEMORAL Right 05/13/2016   Procedure: RIGHT ENDARTERECTOMY FEMORAL;  Surgeon: Serafina Mitchell, MD;   Location: [facility];  Service: Vascular;  Laterality: Right;  . HOLMIUM LASER APPLICATION Right 2/59/5638   Procedure: HOLMIUM LASER APPLICATION;  Surgeon: Alexis Frock, MD;  Location: [facility];  Service: Urology;  Laterality: Right;  . PATCH ANGIOPLASTY Right 05/13/2016   Procedure: [place];  Surgeon: Serafina Mitchell, MD;  Location: [facility];  Service: Vascular;  Laterality: Right;  . PERIPHERAL VASCULAR CATHETERIZATION N/A 02/20/2016   Procedure: Lower Extremity Angiography;  Surgeon: Lorretta Harp, MD;  Location: [facility];  Service: Cardiovascular;  Laterality: N/A;    REVIEW OF SYSTEMS:  Constitutional: positive for fatigue  Eyes: negative Ears, nose, mouth, throat, and face: negative Respiratory: negative Cardiovascular: negative Gastrointestinal: negative Genitourinary:negative Integument/breast: negative Hematologic/lymphatic: negative Musculoskeletal:positive for muscle weakness Neurological: negative Behavioral/Psych: negative Endocrine: negative Allergic/Immunologic: negative   PHYSICAL EXAMINATION: General appearance: alert, cooperative, fatigued and no distress Head: Normocephalic, without obvious abnormality, atraumatic Neck: no adenopathy, no JVD, supple, symmetrical, trachea midline and thyroid not enlarged, symmetric, no tenderness/mass/nodules Lymph nodes: Cervical, supraclavicular, and axillary nodes normal. Resp: clear to auscultation bilaterally Back: symmetric, no curvature. ROM normal. No CVA tenderness. Cardio: regular rate and rhythm, S1, S2 normal, no murmur, click, rub or gallop GI: soft, non-tender; bowel sounds normal; no masses,  no organomegaly Extremities: extremities normal, atraumatic, no cyanosis or edema Neurologic: Alert and oriented X 3, normal strength and tone. Normal symmetric reflexes. Normal coordination and gait  ECOG PERFORMANCE STATUS: 1 - Symptomatic but completely ambulatory  Blood pressure  125/63, pulse 95, temperature 98.4 F (36.9 C), temperature source Oral, resp. rate 18, height 5\' 8"  (1.727 m), weight 158 lb 12.8 oz (72 kg), SpO2 98 %.  LABORATORY DATA: Lab Results  Component Value Date   WBC 18.0 (H) 05/09/2018   HGB 8.0 (L) 05/09/2018   HCT 24.4 (L) 05/09/2018   MCV 92.8 05/09/2018   PLT 299 05/09/2018      Chemistry      Component Value Date/Time   NA 136 05/09/2018 0959   NA 133 (L) 03/30/2018 0829   K 4.0 05/09/2018 0959   CL 103 05/09/2018 0959   CO2 24 05/09/2018 0959   BUN 12 05/09/2018 0959   BUN 31 (H) 03/30/2018 0829   CREATININE 1.15 05/09/2018 0959   CREATININE 1.46 (H) 02/27/2016 0820      Component Value Date/Time   CALCIUM 9.0 05/09/2018 0959   ALKPHOS 121 05/09/2018 0959   AST 21 05/09/2018 0959   ALT 19 05/09/2018 0959   BILITOT 0.4 05/09/2018 0959       RADIOGRAPHIC STUDIES: Ct Chest W Contrast  Result Date: 05/06/2018 CLINICAL DATA:  Small-cell lung cancer diagnosed earlier this year. Radiation therapy in progress. Shortness of breath. Constipation and diarrhea. EXAM: CT CHEST, ABDOMEN, AND PELVIS WITH CONTRAST TECHNIQUE: Multidetector CT imaging of the chest, abdomen and pelvis was performed following the standard protocol during bolus administration of intravenous contrast. CONTRAST:  150mL OMNIPAQUE IOHEXOL 300 MG/ML  SOLN COMPARISON:  03/31/2018 PET. 03/08/2018 chest CT. Stone study of 02/25/2018 FINDINGS: CT CHEST FINDINGS Cardiovascular: Aortic and branch vessel atherosclerosis. Extensive ulcerative plaque in the descending thoracic aorta. Tortuous thoracic aorta. Mild cardiomegaly with minimal pericardial fluid, similar. Multivessel coronary artery atherosclerosis. No central pulmonary embolism, on this non-dedicated study. Pulmonary artery enlargement, outflow tract 3.2 cm Mediastinum/Nodes: Bilateral low cervical/supraclavicular adenopathy has markedly improved to resolved. A low right jugular index node measures 1.0 cm today  versus 2.5 cm on the prior PET. Low-density bilateral thyroid nodules are of doubtful clinical significance. Index right paratracheal node measures 1.4 cm on image 27/2 versus 2.9 cm on the prior. Right hilar adenopathy is improved, including at 1.6 cm on image 31/2 today. A prevascular node measures 1.2 cm today versus 2.8 cm on the prior PET. Lungs/Pleura: Similar small right pleural effusion. Trace left pleural thickening or fluid, decreased since the prior PET. Azygos fissure.  Mild centrilobular emphysema. The right upper lobe lung lesion described on the prior exam has resolved. There is only mild interstitial thickening remaining in the posterior right upper lobe, primarily peripherally on image 62/7. Musculoskeletal: Remote left-sided rib trauma. Partial  healing of comminuted pathologic fracture involving the right scapula. No glenoid extension. Sclerosis involving the T3 vertebral body and less so T9 vertebral bodies. CT ABDOMEN PELVIS FINDINGS Hepatobiliary: No focal liver lesion. Gallstones up to 8 mm, without acute cholecystitis or biliary duct dilatation. Pancreas: Mild pancreatic atrophy, without duct dilatation. Spleen: Splenic calcifications suggest old granulomatous disease. Adrenals/Urinary Tract: Normal right adrenal gland. Left adrenal nodules of maximally 1.6 cm today versus 2.5 cm on the prior. Decreased size of an exophytic upper pole left renal lesion, currently 9 mm on image 67/2. 1.8 cm on the prior. An upper pole exophytic right renal lesion measures 1.2 cm today versus 2.1 cm on the prior. Bilateral nodularity within the perinephric fat and along the renal fascia. An index medial left perinephric nodule measures 1.6 cm on image 84/2 versus 2.2 cm on the prior. No hydronephrosis. Portions of the distal ureters are prominent, but no obstructive cause is seen. This is most likely related to mild bladder distension. Stomach/Bowel: Portions of the stomach are underdistended. transverse  duodenal diverticulum. Small bowel is otherwise unremarkable. Extensive colonic diverticulosis. Normal terminal ileum. Vascular/Lymphatic: Advanced aortic and branch vessel atherosclerosis. No abdominopelvic adenopathy. Reproductive: Prostatomegaly, with median lobe impression into the urinary bladder. Other: No significant free fluid. Musculoskeletal: New right ischial sclerosis on image 129/2. New left iliac wing sclerosis on image 96/2. Sclerosis involving the T1 vertebral body is mild. New since 02/25/2018. IMPRESSION: 1. Response to therapy. Resolution of right upper lobe lung mass, decrease and resolution of lower cervical/thoracic adenopathy, improvement in adrenal, renal and perirenal metastasis. 2. Healing of osseous metastasis with interval development of a mild compression deformity at L1. 3. Decrease in small right pleural effusion. Coronary artery atherosclerosis. Aortic Atherosclerosis (ICD10-I70.0). 4. Cholelithiasis. 5. Pulmonary artery enlargement suggests pulmonary arterial hypertension. Electronically Signed   By: Abigail Miyamoto M.D.   On: 05/06/2018 16:33   Ct Abdomen Pelvis W Contrast  Result Date: 05/06/2018 CLINICAL DATA:  Small-cell lung cancer diagnosed earlier this year. Radiation therapy in progress. Shortness of breath. Constipation and diarrhea. EXAM: CT CHEST, ABDOMEN, AND PELVIS WITH CONTRAST TECHNIQUE: Multidetector CT imaging of the chest, abdomen and pelvis was performed following the standard protocol during bolus administration of intravenous contrast. CONTRAST:  145mL OMNIPAQUE IOHEXOL 300 MG/ML  SOLN COMPARISON:  03/31/2018 PET. 03/08/2018 chest CT. Stone study of 02/25/2018 FINDINGS: CT CHEST FINDINGS Cardiovascular: Aortic and branch vessel atherosclerosis. Extensive ulcerative plaque in the descending thoracic aorta. Tortuous thoracic aorta. Mild cardiomegaly with minimal pericardial fluid, similar. Multivessel coronary artery atherosclerosis. No central pulmonary  embolism, on this non-dedicated study. Pulmonary artery enlargement, outflow tract 3.2 cm Mediastinum/Nodes: Bilateral low cervical/supraclavicular adenopathy has markedly improved to resolved. A low right jugular index node measures 1.0 cm today versus 2.5 cm on the prior PET. Low-density bilateral thyroid nodules are of doubtful clinical significance. Index right paratracheal node measures 1.4 cm on image 27/2 versus 2.9 cm on the prior. Right hilar adenopathy is improved, including at 1.6 cm on image 31/2 today. A prevascular node measures 1.2 cm today versus 2.8 cm on the prior PET. Lungs/Pleura: Similar small right pleural effusion. Trace left pleural thickening or fluid, decreased since the prior PET. Azygos fissure.  Mild centrilobular emphysema. The right upper lobe lung lesion described on the prior exam has resolved. There is only mild interstitial thickening remaining in the posterior right upper lobe, primarily peripherally on image 62/7. Musculoskeletal: Remote left-sided rib trauma. Partial healing of comminuted pathologic fracture involving the right scapula. No  glenoid extension. Sclerosis involving the T3 vertebral body and less so T9 vertebral bodies. CT ABDOMEN PELVIS FINDINGS Hepatobiliary: No focal liver lesion. Gallstones up to 8 mm, without acute cholecystitis or biliary duct dilatation. Pancreas: Mild pancreatic atrophy, without duct dilatation. Spleen: Splenic calcifications suggest old granulomatous disease. Adrenals/Urinary Tract: Normal right adrenal gland. Left adrenal nodules of maximally 1.6 cm today versus 2.5 cm on the prior. Decreased size of an exophytic upper pole left renal lesion, currently 9 mm on image 67/2. 1.8 cm on the prior. An upper pole exophytic right renal lesion measures 1.2 cm today versus 2.1 cm on the prior. Bilateral nodularity within the perinephric fat and along the renal fascia. An index medial left perinephric nodule measures 1.6 cm on image 84/2 versus 2.2 cm  on the prior. No hydronephrosis. Portions of the distal ureters are prominent, but no obstructive cause is seen. This is most likely related to mild bladder distension. Stomach/Bowel: Portions of the stomach are underdistended. transverse duodenal diverticulum. Small bowel is otherwise unremarkable. Extensive colonic diverticulosis. Normal terminal ileum. Vascular/Lymphatic: Advanced aortic and branch vessel atherosclerosis. No abdominopelvic adenopathy. Reproductive: Prostatomegaly, with median lobe impression into the urinary bladder. Other: No significant free fluid. Musculoskeletal: New right ischial sclerosis on image 129/2. New left iliac wing sclerosis on image 96/2. Sclerosis involving the T1 vertebral body is mild. New since 02/25/2018. IMPRESSION: 1. Response to therapy. Resolution of right upper lobe lung mass, decrease and resolution of lower cervical/thoracic adenopathy, improvement in adrenal, renal and perirenal metastasis. 2. Healing of osseous metastasis with interval development of a mild compression deformity at L1. 3. Decrease in small right pleural effusion. Coronary artery atherosclerosis. Aortic Atherosclerosis (ICD10-I70.0). 4. Cholelithiasis. 5. Pulmonary artery enlargement suggests pulmonary arterial hypertension. Electronically Signed   By: Abigail Miyamoto M.D.   On: 05/06/2018 16:33   Dg C-arm 1-60 Min-no Report  Result Date: 04/13/2018 Fluoroscopy was utilized by the requesting physician.  No radiographic interpretation.    ASSESSMENT AND PLAN: This is a very pleasant 77 years old white male recently diagnosed with extensive stage (T2b, N3, M1c) small cell lung cancer presented with right upper lobe lung mass in addition to right mediastinal and supraclavicular lymphadenopathy as well as metastatic disease to the retroperitoneal lymph nodes, left adrenal gland as well as bone disease in the right scapula diagnosed in August 2019. The patient was a started on systemic chemotherapy  with carboplatin, etoposide and Tecentriq status post 2 cycles.  He has been tolerating this treatment well with no concerning adverse effect except for fatigue. He had repeat CT scan of the chest, abdomen and pelvis performed recently. I personally and independently reviewed the scan images and discussed the result and showed the images to the patient and his son-in-law. His a scan showed significant improvement in his disease. I recommended for the patient to continue his current treatment with systemic chemotherapy with carboplatin, etoposide and Tecentriq.  He will proceed with cycle #3 today. I will see him back for follow-up visit in 3 weeks for evaluation before starting cycle #4. The patient was advised to call immediately if he has any concerning symptoms in the interval. The patient voices understanding of current disease status and treatment options and is in agreement with the current care plan. All questions were answered. The patient knows to call the clinic with any problems, questions or concerns. We can certainly see the patient much sooner if necessary.  I spent 15 minutes counseling the patient face to face. The total  time spent in the appointment was 25 minutes.  Disclaimer: This note was dictated with voice recognition software. Similar sounding words can inadvertently be transcribed and may not be corrected upon review.

## 2018-05-09 NOTE — Progress Notes (Signed)
Nutrition follow-up completed with patient receiving treatment for small cell lung cancer. Weight improved and documented as 158.8 pounds October 21 increased from 154.3 pounds September 30. Patient reports that he is eating more and his appetite has improved. He denies nausea. He is pleased with weight gain. He is having some difficulty with constipation/diarrhea. Noted glucose 176.  Nutrition diagnosis: Unintended weight loss improved.  Intervention: Educated patient to continue strategies for adequate calorie and protein intake for weight maintenance/weight gain. Encouraged him to continue oral nutrition supplements. Recommended continued glycemic control. Questions were answered.  Teach back method used.  Monitoring, evaluation, goals: Patient will tolerate increased calories and protein to minimize weight loss.  Next visit: Monday, December 2 during infusion.  **Disclaimer: This note was dictated with voice recognition software. Similar sounding words can inadvertently be transcribed and this note may contain transcription errors which may not have been corrected upon publication of note.**

## 2018-05-09 NOTE — Telephone Encounter (Signed)
Appts already scheduled per 10/21 los - - per patient request - weekly labs to be done with PCP . Let Rn for Dr. Julien Nordmann aware.

## 2018-05-09 NOTE — Telephone Encounter (Signed)
Called pt regarding appts per 10/21 sch message.

## 2018-05-10 ENCOUNTER — Inpatient Hospital Stay: Payer: Medicare Other

## 2018-05-10 VITALS — BP 133/71 | HR 85 | Temp 98.1°F | Resp 16

## 2018-05-10 DIAGNOSIS — C3481 Malignant neoplasm of overlapping sites of right bronchus and lung: Secondary | ICD-10-CM | POA: Diagnosis not present

## 2018-05-10 DIAGNOSIS — C3491 Malignant neoplasm of unspecified part of right bronchus or lung: Secondary | ICD-10-CM

## 2018-05-10 MED ORDER — SODIUM CHLORIDE 0.9 % IV SOLN
100.0000 mg/m2 | Freq: Once | INTRAVENOUS | Status: AC
  Administered 2018-05-10: 190 mg via INTRAVENOUS
  Filled 2018-05-10: qty 9.5

## 2018-05-10 MED ORDER — SODIUM CHLORIDE 0.9 % IV SOLN
Freq: Once | INTRAVENOUS | Status: AC
  Administered 2018-05-10: 13:00:00 via INTRAVENOUS
  Filled 2018-05-10: qty 250

## 2018-05-10 MED ORDER — DEXAMETHASONE SODIUM PHOSPHATE 10 MG/ML IJ SOLN
INTRAMUSCULAR | Status: AC
  Filled 2018-05-10: qty 1

## 2018-05-10 MED ORDER — DEXAMETHASONE SODIUM PHOSPHATE 10 MG/ML IJ SOLN
10.0000 mg | Freq: Once | INTRAMUSCULAR | Status: AC
  Administered 2018-05-10: 10 mg via INTRAVENOUS

## 2018-05-10 NOTE — Patient Instructions (Signed)
[facility] Discharge Instructions for Patients Receiving Chemotherapy  Today you received the following chemotherapy agents:  Etoposide.  To help prevent nausea and vomiting after your treatment, we encourage you to take your nausea medication as directed.   If you develop nausea and vomiting that is not controlled by your nausea medication, call the clinic.   BELOW ARE SYMPTOMS THAT SHOULD BE REPORTED IMMEDIATELY:  *FEVER GREATER THAN 100.5 F  *CHILLS WITH OR WITHOUT FEVER  NAUSEA AND VOMITING THAT IS NOT CONTROLLED WITH YOUR NAUSEA MEDICATION  *UNUSUAL SHORTNESS OF BREATH  *UNUSUAL BRUISING OR BLEEDING  TENDERNESS IN MOUTH AND THROAT WITH OR WITHOUT PRESENCE OF ULCERS  *URINARY PROBLEMS  *BOWEL PROBLEMS  UNUSUAL RASH Items with * indicate a potential emergency and should be followed up as soon as possible.  Feel free to call the clinic should you have any questions or concerns. The clinic phone number is [phone].  Please show the [facility] at check-in to the Emergency Department and triage nurse.

## 2018-05-11 ENCOUNTER — Inpatient Hospital Stay: Payer: Medicare Other

## 2018-05-11 VITALS — BP 133/80 | HR 81 | Temp 97.9°F | Resp 18

## 2018-05-11 DIAGNOSIS — C3491 Malignant neoplasm of unspecified part of right bronchus or lung: Secondary | ICD-10-CM

## 2018-05-11 DIAGNOSIS — C3481 Malignant neoplasm of overlapping sites of right bronchus and lung: Secondary | ICD-10-CM | POA: Diagnosis not present

## 2018-05-11 MED ORDER — DEXAMETHASONE SODIUM PHOSPHATE 10 MG/ML IJ SOLN
INTRAMUSCULAR | Status: AC
  Filled 2018-05-11: qty 1

## 2018-05-11 MED ORDER — SODIUM CHLORIDE 0.9 % IV SOLN
100.0000 mg/m2 | Freq: Once | INTRAVENOUS | Status: AC
  Administered 2018-05-11: 190 mg via INTRAVENOUS
  Filled 2018-05-11: qty 9.5

## 2018-05-11 MED ORDER — SODIUM CHLORIDE 0.9 % IV SOLN
Freq: Once | INTRAVENOUS | Status: AC
  Administered 2018-05-11: 13:00:00 via INTRAVENOUS
  Filled 2018-05-11: qty 250

## 2018-05-11 MED ORDER — DEXAMETHASONE SODIUM PHOSPHATE 10 MG/ML IJ SOLN
10.0000 mg | Freq: Once | INTRAMUSCULAR | Status: AC
  Administered 2018-05-11: 10 mg via INTRAVENOUS

## 2018-05-11 NOTE — Patient Instructions (Signed)
[facility] Discharge Instructions for Patients Receiving Chemotherapy  Today you received the following chemotherapy agents:  Etoposide.  To help prevent nausea and vomiting after your treatment, we encourage you to take your nausea medication as directed.   If you develop nausea and vomiting that is not controlled by your nausea medication, call the clinic.   BELOW ARE SYMPTOMS THAT SHOULD BE REPORTED IMMEDIATELY:  *FEVER GREATER THAN 100.5 F  *CHILLS WITH OR WITHOUT FEVER  NAUSEA AND VOMITING THAT IS NOT CONTROLLED WITH YOUR NAUSEA MEDICATION  *UNUSUAL SHORTNESS OF BREATH  *UNUSUAL BRUISING OR BLEEDING  TENDERNESS IN MOUTH AND THROAT WITH OR WITHOUT PRESENCE OF ULCERS  *URINARY PROBLEMS  *BOWEL PROBLEMS  UNUSUAL RASH Items with * indicate a potential emergency and should be followed up as soon as possible.  Feel free to call the clinic should you have any questions or concerns. The clinic phone number is [phone].  Please show the [facility] at check-in to the Emergency Department and triage nurse.

## 2018-05-13 ENCOUNTER — Inpatient Hospital Stay: Payer: Medicare Other

## 2018-05-13 VITALS — BP 120/60 | HR 90 | Temp 97.9°F | Resp 20

## 2018-05-13 DIAGNOSIS — C3491 Malignant neoplasm of unspecified part of right bronchus or lung: Secondary | ICD-10-CM

## 2018-05-13 DIAGNOSIS — C3481 Malignant neoplasm of overlapping sites of right bronchus and lung: Secondary | ICD-10-CM | POA: Diagnosis not present

## 2018-05-13 MED ORDER — PEGFILGRASTIM-CBQV 6 MG/0.6ML ~~LOC~~ SOSY
PREFILLED_SYRINGE | SUBCUTANEOUS | Status: AC
  Filled 2018-05-13: qty 0.6

## 2018-05-13 MED ORDER — PEGFILGRASTIM-CBQV 6 MG/0.6ML ~~LOC~~ SOSY
6.0000 mg | PREFILLED_SYRINGE | Freq: Once | SUBCUTANEOUS | Status: AC
  Administered 2018-05-13: 6 mg via SUBCUTANEOUS

## 2018-05-13 NOTE — Patient Instructions (Signed)
Pegfilgrastim injection What is this medicine? PEGFILGRASTIM (PEG fil gra stim) is a long-acting granulocyte colony-stimulating factor that stimulates the growth of neutrophils, a type of white blood cell important in the body's fight against infection. It is used to reduce the incidence of fever and infection in patients with certain types of cancer who are receiving chemotherapy that affects the bone marrow, and to increase survival after being exposed to high doses of radiation. This medicine may be used for other purposes; ask your health care provider or pharmacist if you have questions. COMMON BRAND NAME(S): Neulasta What should I tell my health care provider before I take this medicine? They need to know if you have any of these conditions: -kidney disease -latex allergy -ongoing radiation therapy -sickle cell disease -skin reactions to acrylic adhesives (On-Body Injector only) -an unusual or allergic reaction to pegfilgrastim, filgrastim, other medicines, foods, dyes, or preservatives -pregnant or trying to get pregnant -breast-feeding How should I use this medicine? This medicine is for injection under the skin. If you get this medicine at home, you will be taught how to prepare and give the pre-filled syringe or how to use the On-body Injector. Refer to the patient Instructions for Use for detailed instructions. Use exactly as directed. Tell your healthcare provider immediately if you suspect that the On-body Injector may not have performed as intended or if you suspect the use of the On-body Injector resulted in a missed or partial dose. It is important that you put your used needles and syringes in a special sharps container. Do not put them in a trash can. If you do not have a sharps container, call your pharmacist or healthcare provider to get one. Talk to your pediatrician regarding the use of this medicine in children. While this drug may be prescribed for selected conditions,  precautions do apply. Overdosage: If you think you have taken too much of this medicine contact a poison control center or emergency room at once. NOTE: This medicine is only for you. Do not share this medicine with others. What if I miss a dose? It is important not to miss your dose. Call your doctor or health care professional if you miss your dose. If you miss a dose due to an On-body Injector failure or leakage, a new dose should be administered as soon as possible using a single prefilled syringe for manual use. What may interact with this medicine? Interactions have not been studied. Give your health care provider a list of all the medicines, herbs, non-prescription drugs, or dietary supplements you use. Also tell them if you smoke, drink alcohol, or use illegal drugs. Some items may interact with your medicine. This list may not describe all possible interactions. Give your health care provider a list of all the medicines, herbs, non-prescription drugs, or dietary supplements you use. Also tell them if you smoke, drink alcohol, or use illegal drugs. Some items may interact with your medicine. What should I watch for while using this medicine? You may need blood work done while you are taking this medicine. If you are going to need a MRI, CT scan, or other procedure, tell your doctor that you are using this medicine (On-Body Injector only). What side effects may I notice from receiving this medicine? Side effects that you should report to your doctor or health care professional as soon as possible: -allergic reactions like skin rash, itching or hives, swelling of the face, lips, or tongue -dizziness -fever -pain, redness, or irritation at site   where injected -pinpoint red spots on the skin -red or dark-brown urine -shortness of breath or breathing problems -stomach or side pain, or pain at the shoulder -swelling -tiredness -trouble passing urine or change in the amount of urine Side  effects that usually do not require medical attention (report to your doctor or health care professional if they continue or are bothersome): -bone pain -muscle pain This list may not describe all possible side effects. Call your doctor for medical advice about side effects. You may report side effects to FDA at 1-800-FDA-1088. Where should I keep my medicine? Keep out of the reach of children. Store pre-filled syringes in a refrigerator between 2 and 8 degrees C (36 and 46 degrees F). Do not freeze. Keep in carton to protect from light. Throw away this medicine if it is left out of the refrigerator for more than 48 hours. Throw away any unused medicine after the expiration date. NOTE: This sheet is a summary. It may not cover all possible information. If you have questions about this medicine, talk to your doctor, pharmacist, or health care provider.  2018 Elsevier/Gold Standard (2016-07-02 12:58:03)  

## 2018-05-16 ENCOUNTER — Other Ambulatory Visit: Payer: Medicare Other

## 2018-05-18 ENCOUNTER — Ambulatory Visit: Payer: Medicare Other | Admitting: Cardiovascular Disease

## 2018-05-18 ENCOUNTER — Encounter: Payer: Self-pay | Admitting: Cardiovascular Disease

## 2018-05-18 DIAGNOSIS — I2510 Atherosclerotic heart disease of native coronary artery without angina pectoris: Secondary | ICD-10-CM

## 2018-05-18 DIAGNOSIS — I739 Peripheral vascular disease, unspecified: Secondary | ICD-10-CM | POA: Diagnosis not present

## 2018-05-18 DIAGNOSIS — E7800 Pure hypercholesterolemia, unspecified: Secondary | ICD-10-CM

## 2018-05-18 DIAGNOSIS — Z720 Tobacco use: Secondary | ICD-10-CM

## 2018-05-18 DIAGNOSIS — Z9861 Coronary angioplasty status: Secondary | ICD-10-CM

## 2018-05-18 DIAGNOSIS — I10 Essential (primary) hypertension: Secondary | ICD-10-CM | POA: Diagnosis not present

## 2018-05-18 DIAGNOSIS — E43 Unspecified severe protein-calorie malnutrition: Secondary | ICD-10-CM

## 2018-05-18 NOTE — Assessment & Plan Note (Signed)
History of essential hypertension blood pressure measured today 118/72.  He is on amlodipine, metoprolol.  Continue current meds at current dosing.

## 2018-05-18 NOTE — Assessment & Plan Note (Signed)
History of hyperlipidemia on statin therapy with lipid profile from 03/09/2018 revealing total cholesterol of 90, LDL of 39 and HDL of 21.

## 2018-05-18 NOTE — Progress Notes (Signed)
05/18/2018 Arthur [name]   12-29-1940  [id number]  Primary Physician Dione Housekeeper, MD Primary Cardiologist: Lorretta Harp MD FACP, [place], [place], [place]  HPI:  Arthur [name] is a 77 y.o.  mildly overweight, married Caucasian male, father of 69, grandfather to 72 grandchildren who I saw  12/17/2017. He has a history of CAD status post circumflex intervention by myself May 20, 1996. He also had a 60% ostial ramus branch stenosis, a 70% distal RCA stenosis and normal LV function. His last functional study performed August 15, 2010, was nonischemic. He does continue to smoke 3 cigarettes a day. His other problems include hypertension, hyperlipidemia and noninsulin-requiring diabetes. Dr. Karlton Lemon follows his lipid profile. . I obtained lower extremity arterial Doppler studies on 10/15/14 which revealed ABIs showing a right ABI of 1.86. There was a high-frequency signal in his distal left SFA. He now complains of right lower extremity claudication. He denies chest pain or shortness of breath. Dopplers performed 01/28/16 revealed a decline in his right ABI from one down to 0.56 with a new high-frequency signal in his right common femoral artery. Because of an elevated creatinine the patient was admitted yesterday for hydration. His serum creatinine did improve. He underwent lower extremity angiography by myself 02/19/16 revealing a 99% calcified exophytic right common femoral artery stenosis, 75% segmental calcified mid right SFA stenosis with 2 vessel runoff. He underwent right iliofemoral endarterectomy with bovine pericardial patch angioplasty by Dr. Trula Slade 05/13/16 with an excellent clinical result. He no longer has claudication on that side. His follow-up Dopplers performed 06/09/16 revealed normalization of his right ABI 1.0. His right common femoral velocities have normalized as well. HeSays that his left leg actually improved as well after his right common femoral endarterectomy. He denies chest  pain or shortness of breath He  he did have a lacunar stroke 12/18/2016 resulting in left upper and lower extremity impairment.  He now walks with an unsteady gait.  Continues to deny chest pain but does have shortness of breath which sounds chronic. Since I saw him 5 months ago he did have a non-STEMI 03/08/2018 with positive troponin.  A CT scan of his chest demonstrated a mass which ultimately was found to be small cell carcinoma with metastasis leading to ureteral restrict obstruction requiring stent.  He is under the care of Dr. Earlie Server who is giving him chemotherapy and Dr. Bess Harvest who placed a ureteral stent.  Apparently by CT his cancer has somewhat responded although he has lost 20 pounds since I saw him last.  He has stopped smoking since his diagnosis.  Recent 2D echo revealed an EF in the 35% range, decreased since prior echoes possibly related to his non-STEMI and/or chemotherapy.   Current Meds  Medication Sig  . amLODipine (NORVASC) 5 MG tablet Take 1 tablet (5 mg total) by mouth daily.  Marland Kitchen aspirin EC 81 MG EC tablet Take 1 tablet (81 mg total) by mouth daily.  Marland Kitchen atorvastatin (LIPITOR) 20 MG tablet TAKE 1 TABLET (20 MG TOTAL) BY MOUTH DAILY. (Patient taking differently: Take 20 mg by mouth daily at 6 PM. )  . clopidogrel (PLAVIX) 75 MG tablet Take 1 tablet (75 mg total) by mouth daily.  Marland Kitchen docusate sodium (COLACE) 100 MG capsule Take 100 mg by mouth 2 (two) times daily.  Marland Kitchen doxazosin (CARDURA) 1 MG tablet Take 3 mg by mouth at bedtime. Pt takes 3 at bedtime   . fish oil-omega-3 fatty acids 1000 MG capsule Take  2 g by mouth daily.   Marland Kitchen glucose blood (ONETOUCH VERIO) test strip CHECK BLOOD SUGARS 3 DAILY:DX 250.40 One touch verio  . insulin lispro (HUMALOG) 100 UNIT/ML injection Inject 0.35 mLs (35 Units total) into the skin 2 (two) times daily. (Patient taking differently: Inject 25-40 Units into the skin 2 (two) times daily at 10 AM and 5 PM. )  . isosorbide mononitrate (IMDUR) 30 MG 24 hr  tablet Take 1 tablet (30 mg total) by mouth daily.  . meloxicam (MOBIC) 7.5 MG tablet Take 7.5 mg by mouth daily.  . metoprolol tartrate (LOPRESSOR) 50 MG tablet TAKE 1 TABLET (50 MG TOTAL) BY MOUTH 2 (TWO) TIMES DAILY. KEEP OV.  . Misc. Devices MISC 1 wheelchair for mobility.  . nitroGLYCERIN (NITROSTAT) 0.4 MG SL tablet Place 1 tablet (0.4 mg total) under the tongue every 5 (five) minutes x 3 doses as needed for chest pain.  . Nutritional Supplements (FEEDING SUPPLEMENT, GLUCERNA 1.2 CAL,) LIQD Take as directed by physician  . oxybutynin (DITROPAN) 5 MG tablet Take 5 mg by mouth daily.   . pantoprazole (PROTONIX) 40 MG tablet Take 1 tablet (40 mg total) by mouth daily.  . polyethylene glycol (MIRALAX / GLYCOLAX) packet Take 17 g by mouth 2 (two) times daily.  . prochlorperazine (COMPAZINE) 10 MG tablet Take 1 tablet (10 mg total) by mouth every 6 (six) hours as needed for nausea or vomiting.  . senna-docusate (SENOKOT-S) 8.6-50 MG tablet Take 1 tablet by mouth 2 (two) times daily. While taking strong pain meds to prevent constipation  . Sennosides (EX-LAX PO) Take 2 tablets by mouth daily as needed (constipation).  . traMADol (ULTRAM) 50 MG tablet Take 1-2 tablets (50-100 mg total) by mouth every 6 (six) hours as needed for moderate pain or severe pain. Post-operatively     Allergies  Allergen Reactions  . Codeine Swelling    Social History   Socioeconomic History  . Marital status: Married    Spouse name: Not on file  . Number of children: Not on file  . Years of education: Not on file  . Highest education level: Not on file  Occupational History  . Not on file  Social Needs  . Financial resource strain: Not on file  . Food insecurity:    Worry: Not on file    Inability: Not on file  . Transportation needs:    Medical: Not on file    Non-medical: Not on file  Tobacco Use  . Smoking status: Former Smoker    Packs/day: 0.25    Years: 65.00    Pack years: 16.25    Types:  Cigarettes  . Smokeless tobacco: Never Used  . Tobacco comment: 05/13/2016 "stopped smoking ~ 1 month ago"  Substance and Sexual Activity  . Alcohol use: No  . Drug use: No  . Sexual activity: Not Currently  Lifestyle  . Physical activity:    Days per week: Not on file    Minutes per session: Not on file  . Stress: Not on file  Relationships  . Social connections:    Talks on phone: Not on file    Gets together: Not on file    Attends religious service: Not on file    Active member of club or organization: Not on file    Attends meetings of clubs or organizations: Not on file    Relationship status: Not on file  . Intimate partner violence:    Fear of current or ex partner:  Not on file    Emotionally abused: Not on file    Physically abused: Not on file    Forced sexual activity: Not on file  Other Topics Concern  . Not on file  Social History Narrative   Retired from [employer]. Married 2 children, 11 grand children, 6 great grand children     Review of Systems: General: negative for chills, fever, night sweats or weight changes.  Cardiovascular: negative for chest pain, dyspnea on exertion, edema, orthopnea, palpitations, paroxysmal nocturnal dyspnea or shortness of breath Dermatological: negative for rash Respiratory: negative for cough or wheezing Urologic: negative for hematuria Abdominal: negative for nausea, vomiting, diarrhea, bright red blood per rectum, melena, or hematemesis Neurologic: negative for visual changes, syncope, or dizziness All other systems reviewed and are otherwise negative except as noted above.    Blood pressure 118/72, pulse 90, height 5\' 9"  (1.753 m), weight 156 lb 6.4 oz (70.9 kg), SpO2 96 %.  General appearance: alert and no distress Neck: no adenopathy, no carotid bruit, no JVD, supple, symmetrical, trachea midline and thyroid not enlarged, symmetric, no tenderness/mass/nodules Lungs: clear to auscultation bilaterally Heart: regular rate  and rhythm, S1, S2 normal, no murmur, click, rub or gallop Extremities: extremities normal, atraumatic, no cyanosis or edema Pulses: 2+ and symmetric Skin: Skin color, texture, turgor normal. No rashes or lesions Neurologic: Alert and oriented X 3, normal strength and tone. Normal symmetric reflexes. Normal coordination and gait  EKG not performed today  ASSESSMENT AND PLAN:   CAD S/P percutaneous coronary angioplasty History of CAD status post circumflex stenting by myself 05/20/1996.  He also had a 60% percent ostial ramus branch stenosis, 70% distal RCA stenosis with normal LV function.  His last functional study performed 08/15/2010 was nonischemic.  He denies chest pain or shortness of breath.  Essential hypertension History of essential hypertension blood pressure measured today 118/72.  He is on amlodipine, metoprolol.  Continue current meds at current dosing.  Hyperlipidemia History of hyperlipidemia on statin therapy with lipid profile from 03/09/2018 revealing total cholesterol of 90, LDL of 39 and HDL of 21.  PAD (peripheral artery disease) (HCC) History of peripheral arterial disease with lower extremity angiography performed performed by myself 02/19/2016 revealing a 99% calcified exophytic right common femoral artery stenosis, 75% segmental calcified mid right SFA stenosis with two-vessel runoff.  He underwent right iliofemoral endarterectomy with bovine pericardial patch angioplasty by Dr. Trula Slade at my direction 05/13/2016 with an excellent result.  His Dopplers improved and his claudication has resolved.  Tobacco abuse History of long-term tobacco abuse having stopped 3 months ago.  He stopped when he was diagnosed with small cell carcinoma of the lung.  Protein calorie malnutrition ([place]) History of 20 pound weight loss over the last 6 months since I saw him as a result of his cancer and chemotherapy.      Lorretta Harp MD FACP,FACC,FAHA, FSCAI 05/18/2018 10:30 AM

## 2018-05-18 NOTE — Assessment & Plan Note (Signed)
History of CAD status post circumflex stenting by myself 05/20/1996.  He also had a 60% percent ostial ramus branch stenosis, 70% distal RCA stenosis with normal LV function.  His last functional study performed 08/15/2010 was nonischemic.  He denies chest pain or shortness of breath.

## 2018-05-18 NOTE — Assessment & Plan Note (Signed)
History of long-term tobacco abuse having stopped 3 months ago.  He stopped when he was diagnosed with small cell carcinoma of the lung.

## 2018-05-18 NOTE — Patient Instructions (Signed)
Medication Instructions:  Your physician recommends that you continue on your current medications as directed. Please refer to the Current Medication list given to you today.  If you need a refill on your cardiac medications before your next appointment, please call your pharmacy.   Lab work: none If you have labs (blood work) drawn today and your tests are completely normal, you will receive your results only by: Marland Kitchen MyChart Message (if you have MyChart) OR . A paper copy in the mail If you have any lab test that is abnormal or we need to change your treatment, we will call you to review the results.  Testing/Procedures: none  Follow-Up: At [facility], you and your health needs are our priority.  As part of our continuing mission to provide you with exceptional heart care, we have created designated Provider Care Teams.  These Care Teams include your primary Cardiologist (physician) and Advanced Practice Providers (APPs -  Physician Assistants and Nurse Practitioners) who all work together to provide you with the care you need, when you need it.  We request that you follow-up in: 6 months with Kerin Ransom, PA and in 12 months with Dr Gwenlyn Found.   Please call our office 2 months in advance to schedule this appointment.  You may see Quay Burow, MD or one of the following Advanced Practice Providers on your designated Care Team:   Kerin Ransom, PA-C Roby Lofts, [place] . Sande Rives, PA-C  Any Other Special Instructions Will Be Listed Below (If Applicable).

## 2018-05-18 NOTE — Assessment & Plan Note (Signed)
History of 20 pound weight loss over the last 6 months since I saw him as a result of his cancer and chemotherapy.

## 2018-05-18 NOTE — Assessment & Plan Note (Signed)
History of peripheral arterial disease with lower extremity angiography performed performed by myself 02/19/2016 revealing a 99% calcified exophytic right common femoral artery stenosis, 75% segmental calcified mid right SFA stenosis with two-vessel runoff.  He underwent right iliofemoral endarterectomy with bovine pericardial patch angioplasty by Dr. Trula Slade at my direction 05/13/2016 with an excellent result.  His Dopplers improved and his claudication has resolved.

## 2018-05-23 ENCOUNTER — Other Ambulatory Visit: Payer: Medicare Other

## 2018-05-28 ENCOUNTER — Other Ambulatory Visit: Payer: Self-pay | Admitting: Cardiovascular Disease

## 2018-05-30 ENCOUNTER — Telehealth: Payer: Self-pay | Admitting: Internal Medicine

## 2018-05-30 ENCOUNTER — Ambulatory Visit: Payer: Medicare Other

## 2018-05-30 ENCOUNTER — Inpatient Hospital Stay: Payer: Medicare Other | Attending: Internal Medicine

## 2018-05-30 ENCOUNTER — Inpatient Hospital Stay: Payer: Medicare Other | Admitting: Lab

## 2018-05-30 ENCOUNTER — Encounter: Payer: Self-pay | Admitting: Internal Medicine

## 2018-05-30 ENCOUNTER — Inpatient Hospital Stay (HOSPITAL_BASED_OUTPATIENT_CLINIC_OR_DEPARTMENT_OTHER): Payer: Medicare Other | Admitting: Internal Medicine

## 2018-05-30 ENCOUNTER — Other Ambulatory Visit: Payer: Self-pay | Admitting: Medical Oncology

## 2018-05-30 VITALS — BP 125/66 | HR 92 | Temp 98.1°F | Resp 18 | Ht 69.0 in | Wt 167.2 lb

## 2018-05-30 DIAGNOSIS — E1151 Type 2 diabetes mellitus with diabetic peripheral angiopathy without gangrene: Secondary | ICD-10-CM

## 2018-05-30 DIAGNOSIS — R0602 Shortness of breath: Secondary | ICD-10-CM | POA: Insufficient documentation

## 2018-05-30 DIAGNOSIS — Z79899 Other long term (current) drug therapy: Secondary | ICD-10-CM

## 2018-05-30 DIAGNOSIS — Z23 Encounter for immunization: Secondary | ICD-10-CM | POA: Diagnosis not present

## 2018-05-30 DIAGNOSIS — Z7689 Persons encountering health services in other specified circumstances: Secondary | ICD-10-CM | POA: Diagnosis not present

## 2018-05-30 DIAGNOSIS — J90 Pleural effusion, not elsewhere classified: Secondary | ICD-10-CM | POA: Diagnosis not present

## 2018-05-30 DIAGNOSIS — C3481 Malignant neoplasm of overlapping sites of right bronchus and lung: Secondary | ICD-10-CM

## 2018-05-30 DIAGNOSIS — R5383 Other fatigue: Secondary | ICD-10-CM | POA: Insufficient documentation

## 2018-05-30 DIAGNOSIS — M129 Arthropathy, unspecified: Secondary | ICD-10-CM | POA: Insufficient documentation

## 2018-05-30 DIAGNOSIS — E1122 Type 2 diabetes mellitus with diabetic chronic kidney disease: Secondary | ICD-10-CM | POA: Insufficient documentation

## 2018-05-30 DIAGNOSIS — R197 Diarrhea, unspecified: Secondary | ICD-10-CM | POA: Insufficient documentation

## 2018-05-30 DIAGNOSIS — Z5112 Encounter for antineoplastic immunotherapy: Secondary | ICD-10-CM

## 2018-05-30 DIAGNOSIS — E785 Hyperlipidemia, unspecified: Secondary | ICD-10-CM

## 2018-05-30 DIAGNOSIS — R5382 Chronic fatigue, unspecified: Secondary | ICD-10-CM

## 2018-05-30 DIAGNOSIS — I712 Thoracic aortic aneurysm, without rupture: Secondary | ICD-10-CM | POA: Diagnosis not present

## 2018-05-30 DIAGNOSIS — K5900 Constipation, unspecified: Secondary | ICD-10-CM

## 2018-05-30 DIAGNOSIS — C3491 Malignant neoplasm of unspecified part of right bronchus or lung: Secondary | ICD-10-CM

## 2018-05-30 DIAGNOSIS — D649 Anemia, unspecified: Secondary | ICD-10-CM

## 2018-05-30 DIAGNOSIS — I252 Old myocardial infarction: Secondary | ICD-10-CM | POA: Diagnosis not present

## 2018-05-30 DIAGNOSIS — N189 Chronic kidney disease, unspecified: Secondary | ICD-10-CM

## 2018-05-30 DIAGNOSIS — I2510 Atherosclerotic heart disease of native coronary artery without angina pectoris: Secondary | ICD-10-CM | POA: Diagnosis not present

## 2018-05-30 DIAGNOSIS — K8020 Calculus of gallbladder without cholecystitis without obstruction: Secondary | ICD-10-CM | POA: Diagnosis not present

## 2018-05-30 DIAGNOSIS — I10 Essential (primary) hypertension: Secondary | ICD-10-CM

## 2018-05-30 DIAGNOSIS — C778 Secondary and unspecified malignant neoplasm of lymph nodes of multiple regions: Secondary | ICD-10-CM

## 2018-05-30 DIAGNOSIS — E048 Other specified nontoxic goiter: Secondary | ICD-10-CM | POA: Insufficient documentation

## 2018-05-30 DIAGNOSIS — N400 Enlarged prostate without lower urinary tract symptoms: Secondary | ICD-10-CM | POA: Diagnosis not present

## 2018-05-30 DIAGNOSIS — I110 Hypertensive heart disease with heart failure: Secondary | ICD-10-CM | POA: Diagnosis not present

## 2018-05-30 DIAGNOSIS — K5730 Diverticulosis of large intestine without perforation or abscess without bleeding: Secondary | ICD-10-CM | POA: Diagnosis not present

## 2018-05-30 DIAGNOSIS — Z7982 Long term (current) use of aspirin: Secondary | ICD-10-CM | POA: Insufficient documentation

## 2018-05-30 DIAGNOSIS — C7951 Secondary malignant neoplasm of bone: Secondary | ICD-10-CM | POA: Insufficient documentation

## 2018-05-30 DIAGNOSIS — Z5111 Encounter for antineoplastic chemotherapy: Secondary | ICD-10-CM | POA: Insufficient documentation

## 2018-05-30 DIAGNOSIS — K219 Gastro-esophageal reflux disease without esophagitis: Secondary | ICD-10-CM | POA: Insufficient documentation

## 2018-05-30 LAB — CMP (CANCER CENTER ONLY)
ALT: 13 U/L (ref 0–44)
AST: 13 U/L — ABNORMAL LOW (ref 15–41)
Albumin: 3.2 g/dL — ABNORMAL LOW (ref 3.5–5.0)
Alkaline Phosphatase: 112 U/L (ref 38–126)
Anion gap: 9 (ref 5–15)
BUN: 12 mg/dL (ref 8–23)
CHLORIDE: 105 mmol/L (ref 98–111)
CO2: 25 mmol/L (ref 22–32)
CREATININE: 1 mg/dL (ref 0.61–1.24)
Calcium: 8.7 mg/dL — ABNORMAL LOW (ref 8.9–10.3)
GFR, Estimated: >60 mL/min (ref >60)
Glucose, Bld: 151 mg/dL — ABNORMAL HIGH (ref 70–99)
POTASSIUM: 4 mmol/L (ref 3.5–5.1)
SODIUM: 139 mmol/L (ref 135–145)
Total Bilirubin: 0.4 mg/dL (ref 0.3–1.2)
Total Protein: 6.2 g/dL — ABNORMAL LOW (ref 6.5–8.1)

## 2018-05-30 LAB — CBC WITH DIFFERENTIAL (CANCER CENTER ONLY)
ABS IMMATURE GRANULOCYTES: 0.39 10*3/uL — AB (ref 0.00–0.07)
BASOS PCT: 0 %
Basophils Absolute: 0.1 10*3/uL (ref 0.0–0.1)
Eosinophils Absolute: 0 10*3/uL (ref 0.0–0.5)
Eosinophils Relative: 0 %
HCT: 24.4 % — ABNORMAL LOW (ref 39.0–52.0)
Hemoglobin: 7.6 g/dL — ABNORMAL LOW (ref 13.0–17.0)
IMMATURE GRANULOCYTES: 2 %
Lymphocytes Relative: 9 %
Lymphs Abs: 1.5 10*3/uL (ref 0.7–4.0)
MCH: 31.8 pg (ref 26.0–34.0)
MCHC: 31.1 g/dL (ref 30.0–36.0)
MCV: 102.1 fL — ABNORMAL HIGH (ref 80.0–100.0)
Monocytes Absolute: 1.2 10*3/uL — ABNORMAL HIGH (ref 0.1–1.0)
Monocytes Relative: 7 %
Neutro Abs: 13 10*3/uL — ABNORMAL HIGH (ref 1.7–7.7)
Neutrophils Relative %: 82 %
PLATELETS: 175 10*3/uL (ref 150–400)
RBC: 2.39 MIL/uL — ABNORMAL LOW (ref 4.22–5.81)
RDW: 23.9 % — ABNORMAL HIGH (ref 11.5–15.5)
WBC Count: 16.1 10*3/uL — ABNORMAL HIGH (ref 4.0–10.5)
nRBC: 0.2 % (ref 0.0–0.2)

## 2018-05-30 LAB — ABO/RH: ABO/RH(D): A NEG

## 2018-05-30 LAB — TSH: TSH: 1.806 u[IU]/mL (ref 0.320–4.118)

## 2018-05-30 MED ORDER — SODIUM CHLORIDE 0.9 % IV SOLN
443.5000 mg | Freq: Once | INTRAVENOUS | Status: AC
  Administered 2018-05-30: 440 mg via INTRAVENOUS
  Filled 2018-05-30: qty 44

## 2018-05-30 MED ORDER — PALONOSETRON HCL INJECTION 0.25 MG/5ML
0.2500 mg | Freq: Once | INTRAVENOUS | Status: AC
  Administered 2018-05-30: 0.25 mg via INTRAVENOUS

## 2018-05-30 MED ORDER — INFLUENZA VAC SPLIT QUAD 0.5 ML IM SUSY
PREFILLED_SYRINGE | INTRAMUSCULAR | Status: AC
  Filled 2018-05-30: qty 0.5

## 2018-05-30 MED ORDER — SODIUM CHLORIDE 0.9 % IV SOLN
100.0000 mg/m2 | Freq: Once | INTRAVENOUS | Status: AC
  Administered 2018-05-30: 190 mg via INTRAVENOUS
  Filled 2018-05-30: qty 9.5

## 2018-05-30 MED ORDER — PALONOSETRON HCL INJECTION 0.25 MG/5ML
INTRAVENOUS | Status: AC
  Filled 2018-05-30: qty 5

## 2018-05-30 MED ORDER — SODIUM CHLORIDE 0.9 % IV SOLN
1200.0000 mg | Freq: Once | INTRAVENOUS | Status: AC
  Administered 2018-05-30: 1200 mg via INTRAVENOUS
  Filled 2018-05-30: qty 20

## 2018-05-30 MED ORDER — SODIUM CHLORIDE 0.9 % IV SOLN
Freq: Once | INTRAVENOUS | Status: AC
  Administered 2018-05-30: 11:00:00 via INTRAVENOUS
  Filled 2018-05-30: qty 250

## 2018-05-30 MED ORDER — SODIUM CHLORIDE 0.9 % IV SOLN
Freq: Once | INTRAVENOUS | Status: AC
  Administered 2018-05-30: 12:00:00 via INTRAVENOUS
  Filled 2018-05-30: qty 5

## 2018-05-30 MED ORDER — INFLUENZA VAC SPLIT QUAD 0.5 ML IM SUSY
0.5000 mL | PREFILLED_SYRINGE | Freq: Once | INTRAMUSCULAR | Status: AC
  Administered 2018-05-30: 0.5 mL via INTRAMUSCULAR

## 2018-05-30 NOTE — Patient Instructions (Signed)
[facility] Discharge Instructions for Patients Receiving Chemotherapy  Today you received the following chemotherapy agents Tecentriq,  Carboplatin, Etoposide  To help prevent nausea and vomiting after your treatment, we encourage you to take your nausea medication as directed   If you develop nausea and vomiting that is not controlled by your nausea medication, call the clinic.   BELOW ARE SYMPTOMS THAT SHOULD BE REPORTED IMMEDIATELY:  *FEVER GREATER THAN 100.5 F  *CHILLS WITH OR WITHOUT FEVER  NAUSEA AND VOMITING THAT IS NOT CONTROLLED WITH YOUR NAUSEA MEDICATION  *UNUSUAL SHORTNESS OF BREATH  *UNUSUAL BRUISING OR BLEEDING  TENDERNESS IN MOUTH AND THROAT WITH OR WITHOUT PRESENCE OF ULCERS  *URINARY PROBLEMS  *BOWEL PROBLEMS  UNUSUAL RASH Items with * indicate a potential emergency and should be followed up as soon as possible.  Feel free to call the clinic should you have any questions or concerns. The clinic phone number is [phone].  Please show the [facility] at check-in to the Emergency Department and triage nurse.

## 2018-05-30 NOTE — Progress Notes (Signed)
[facility] [phone]   [phone]  OFFICE PROGRESS NOTE  Dione Housekeeper, MD [place] [address]  DIAGNOSIS: Extensive stage (T2b, N3, M1c) small cell lung cancer, presented with large right upper lobe lung mass with extension to the right hilum in addition to the external and supraclavicular lymphadenopathy as well as metastatic disease to the retroperitoneal lymph nodes and left adrenal gland and suspicious right scapular bone lesion diagnosed in August 2019  PRIOR THERAPY: None.  CURRENT THERAPY: Systemic chemotherapy with carboplatin for AUC of 5 on day 1, etoposide 100 mg/M2 on days 1, 2 and 3 as well as Tecentriq 1200 mg IV every 3 weeks with Neulasta support.  First dose 03/28/2018.  Status post 3 cycles.  INTERVAL HISTORY: Arthur [name] 77 y.o. male returns to the clinic today for follow-up visit accompanied by family member.  The patient is feeling fine today with no concerning complaints except for mild fatigue.  He is tolerating his systemic chemotherapy with carboplatin, etoposide and Tecentriq fairly well.  He denied having any chest pain, shortness breath, cough or hemoptysis.  He denied having any fever or chills.  He has no nausea, vomiting, diarrhea or constipation.  He is here today for evaluation before starting cycle #4 of his treatment.  MEDICAL HISTORY: Past Medical History:  Diagnosis Date  . Arthritis   . Ascending aortic aneurysm (HCC)    PER CT CHEST 03-08-18 IN [facility] , 4.0CM  . Chronic kidney disease   . Claudication ([place])   . Coronary artery disease 1997   CFX PCI, '97. Mod residual RI and RCA dis.  . Diabetes mellitus ([facility])    IDDM  . Dyslipidemia   . Dyspnea   . GERD (gastroesophageal reflux disease)   . Hard of hearing   . History of bronchitis   . History of chemotherapy   . History of kidney stones 1958  . HTN (hypertension)   . Lacunar stroke ([facility])   . Metastatic lung cancer  (metastasis from lung to other site), unspecified laterality ([facility])    DIAGNOSED 02-2018, MANAGED BY DR Stover  . NSTEMI (non-ST elevated myocardial infarction) ([facility])   . PAD (peripheral artery disease) ([place])   . Pneumonia   . Tobacco abuse   . Urinary incontinence   . Wears glasses     ALLERGIES:  is allergic to codeine.  MEDICATIONS:  Current Outpatient Medications  Medication Sig Dispense Refill  . amLODipine (NORVASC) 5 MG tablet Take 1 tablet (5 mg total) by mouth daily. 180 tablet 3  . aspirin EC 81 MG EC tablet Take 1 tablet (81 mg total) by mouth daily. 30 tablet 1  . atorvastatin (LIPITOR) 20 MG tablet TAKE 1 TABLET (20 MG TOTAL) BY MOUTH DAILY. (Patient taking differently: Take 20 mg by mouth daily at 6 PM. ) 90 tablet 2  . clopidogrel (PLAVIX) 75 MG tablet Take 1 tablet (75 mg total) by mouth daily. 30 tablet 1  . docusate sodium (COLACE) 100 MG capsule Take 100 mg by mouth 2 (two) times daily.    Marland Kitchen doxazosin (CARDURA) 1 MG tablet Take 3 mg by mouth at bedtime. Pt takes 3 at bedtime     . fish oil-omega-3 fatty acids 1000 MG capsule Take 2 g by mouth daily.     Marland Kitchen glucose blood (ONETOUCH VERIO) test strip CHECK BLOOD SUGARS 3 DAILY:DX 250.40 One touch verio    . insulin lispro (  HUMALOG) 100 UNIT/ML injection Inject 0.35 mLs (35 Units total) into the skin 2 (two) times daily. (Patient taking differently: Inject 25-40 Units into the skin 2 (two) times daily at 10 AM and 5 PM. ) 10 mL 11  . isosorbide mononitrate (IMDUR) 30 MG 24 hr tablet Take 1 tablet (30 mg total) by mouth daily. 90 tablet 1  . meloxicam (MOBIC) 7.5 MG tablet Take 7.5 mg by mouth daily.    . metoprolol tartrate (LOPRESSOR) 50 MG tablet TAKE 1 TABLET (50 MG TOTAL) BY MOUTH 2 (TWO) TIMES DAILY. KEEP OV. 180 tablet 0  . Misc. Devices MISC 1 wheelchair for mobility.    . nitroGLYCERIN (NITROSTAT) 0.4 MG SL tablet Place 1 tablet (0.4 mg total) under the tongue every 5 (five) minutes x 3 doses as  needed for chest pain. 25 tablet 4  . Nutritional Supplements (FEEDING SUPPLEMENT, GLUCERNA 1.2 CAL,) LIQD Take as directed by physician    . oxybutynin (DITROPAN) 5 MG tablet Take 5 mg by mouth daily.     . pantoprazole (PROTONIX) 40 MG tablet Take 1 tablet (40 mg total) by mouth daily. 30 tablet 1  . polyethylene glycol (MIRALAX / GLYCOLAX) packet Take 17 g by mouth 2 (two) times daily.    . prochlorperazine (COMPAZINE) 10 MG tablet Take 1 tablet (10 mg total) by mouth every 6 (six) hours as needed for nausea or vomiting. 30 tablet 0  . senna-docusate (SENOKOT-S) 8.6-50 MG tablet Take 1 tablet by mouth 2 (two) times daily. While taking strong pain meds to prevent constipation 20 tablet 0  . Sennosides (EX-LAX PO) Take 2 tablets by mouth daily as needed (constipation).    . traMADol (ULTRAM) 50 MG tablet Take 1-2 tablets (50-100 mg total) by mouth every 6 (six) hours as needed for moderate pain or severe pain. Post-operatively 20 tablet 0   No current facility-administered medications for this visit.     SURGICAL HISTORY:  Past Surgical History:  Procedure Laterality Date  . APPENDECTOMY     "busted on me"  . COLONOSCOPY    . CORONARY ANGIOPLASTY  1997   CFX  . CYSTOSCOPY WITH RETROGRADE PYELOGRAM, URETEROSCOPY AND STENT PLACEMENT Right 04/13/2018   Procedure: CYSTOSCOPY WITH RETROGRADE PYELOGRAM, URETEROSCOPY AND STENT PLACEMENT;  Surgeon: Alexis Frock, MD;  Location: [facility];  Service: Urology;  Laterality: Right;  1 HR  . ENDARTERECTOMY FEMORAL Right 05/13/2016   iliofemoral endarterectomy with bovine pericardial patch angioplasty  . ENDARTERECTOMY FEMORAL Right 05/13/2016   Procedure: RIGHT ENDARTERECTOMY FEMORAL;  Surgeon: Serafina Mitchell, MD;  Location: [facility];  Service: Vascular;  Laterality: Right;  . HOLMIUM LASER APPLICATION Right 1/61/0960   Procedure: HOLMIUM LASER APPLICATION;  Surgeon: Alexis Frock, MD;  Location: [facility];  Service: Urology;  Laterality: Right;  . PATCH  ANGIOPLASTY Right 05/13/2016   Procedure: [place];  Surgeon: Serafina Mitchell, MD;  Location: [facility];  Service: Vascular;  Laterality: Right;  . PERIPHERAL VASCULAR CATHETERIZATION N/A 02/20/2016   Procedure: Lower Extremity Angiography;  Surgeon: Lorretta Harp, MD;  Location: [facility];  Service: Cardiovascular;  Laterality: N/A;    REVIEW OF SYSTEMS:  A comprehensive review of systems was negative except for: Constitutional: positive for fatigue   PHYSICAL EXAMINATION: General appearance: alert, cooperative, fatigued and no distress Head: Normocephalic, without obvious abnormality, atraumatic Neck: no adenopathy, no JVD, supple, symmetrical, trachea midline and thyroid not enlarged, symmetric, no tenderness/mass/nodules Lymph nodes: Cervical, supraclavicular, and  axillary nodes normal. Resp: clear to auscultation bilaterally Back: symmetric, no curvature. ROM normal. No CVA tenderness. Cardio: regular rate and rhythm, S1, S2 normal, no murmur, click, rub or gallop GI: soft, non-tender; bowel sounds normal; no masses,  no organomegaly Extremities: extremities normal, atraumatic, no cyanosis or edema  ECOG PERFORMANCE STATUS: 1 - Symptomatic but completely ambulatory  Blood pressure 125/66, pulse 92, temperature 98.1 F (36.7 C), temperature source Oral, resp. rate 18, height 5\' 9"  (1.753 m), weight 167 lb 3.2 oz (75.8 kg), SpO2 99 %.  LABORATORY DATA: Lab Results  Component Value Date   WBC 16.1 (H) 05/30/2018   HGB 7.6 (L) 05/30/2018   HCT 24.4 (L) 05/30/2018   MCV 102.1 (H) 05/30/2018   PLT 175 05/30/2018      Chemistry      Component Value Date/Time   NA 139 05/30/2018 0911   NA 133 (L) 03/30/2018 0829   K 4.0 05/30/2018 0911   CL 105 05/30/2018 0911   CO2 25 05/30/2018 0911   BUN 12 05/30/2018 0911   BUN 31 (H) 03/30/2018 0829   CREATININE 1.00 05/30/2018 0911   CREATININE 1.46 (H) 02/27/2016 0820      Component Value  Date/Time   CALCIUM 8.7 (L) 05/30/2018 0911   ALKPHOS 112 05/30/2018 0911   AST 13 (L) 05/30/2018 0911   ALT 13 05/30/2018 0911   BILITOT 0.4 05/30/2018 0911       RADIOGRAPHIC STUDIES: Ct Chest W Contrast  Result Date: 05/06/2018 CLINICAL DATA:  Small-cell lung cancer diagnosed earlier this year. Radiation therapy in progress. Shortness of breath. Constipation and diarrhea. EXAM: CT CHEST, ABDOMEN, AND PELVIS WITH CONTRAST TECHNIQUE: Multidetector CT imaging of the chest, abdomen and pelvis was performed following the standard protocol during bolus administration of intravenous contrast. CONTRAST:  156mL OMNIPAQUE IOHEXOL 300 MG/ML  SOLN COMPARISON:  03/31/2018 PET. 03/08/2018 chest CT. Stone study of 02/25/2018 FINDINGS: CT CHEST FINDINGS Cardiovascular: Aortic and branch vessel atherosclerosis. Extensive ulcerative plaque in the descending thoracic aorta. Tortuous thoracic aorta. Mild cardiomegaly with minimal pericardial fluid, similar. Multivessel coronary artery atherosclerosis. No central pulmonary embolism, on this non-dedicated study. Pulmonary artery enlargement, outflow tract 3.2 cm Mediastinum/Nodes: Bilateral low cervical/supraclavicular adenopathy has markedly improved to resolved. A low right jugular index node measures 1.0 cm today versus 2.5 cm on the prior PET. Low-density bilateral thyroid nodules are of doubtful clinical significance. Index right paratracheal node measures 1.4 cm on image 27/2 versus 2.9 cm on the prior. Right hilar adenopathy is improved, including at 1.6 cm on image 31/2 today. A prevascular node measures 1.2 cm today versus 2.8 cm on the prior PET. Lungs/Pleura: Similar small right pleural effusion. Trace left pleural thickening or fluid, decreased since the prior PET. Azygos fissure.  Mild centrilobular emphysema. The right upper lobe lung lesion described on the prior exam has resolved. There is only mild interstitial thickening remaining in the posterior right  upper lobe, primarily peripherally on image 62/7. Musculoskeletal: Remote left-sided rib trauma. Partial healing of comminuted pathologic fracture involving the right scapula. No glenoid extension. Sclerosis involving the T3 vertebral body and less so T9 vertebral bodies. CT ABDOMEN PELVIS FINDINGS Hepatobiliary: No focal liver lesion. Gallstones up to 8 mm, without acute cholecystitis or biliary duct dilatation. Pancreas: Mild pancreatic atrophy, without duct dilatation. Spleen: Splenic calcifications suggest old granulomatous disease. Adrenals/Urinary Tract: Normal right adrenal gland. Left adrenal nodules of maximally 1.6 cm today versus 2.5 cm on the prior. Decreased size of an exophytic upper  pole left renal lesion, currently 9 mm on image 67/2. 1.8 cm on the prior. An upper pole exophytic right renal lesion measures 1.2 cm today versus 2.1 cm on the prior. Bilateral nodularity within the perinephric fat and along the renal fascia. An index medial left perinephric nodule measures 1.6 cm on image 84/2 versus 2.2 cm on the prior. No hydronephrosis. Portions of the distal ureters are prominent, but no obstructive cause is seen. This is most likely related to mild bladder distension. Stomach/Bowel: Portions of the stomach are underdistended. transverse duodenal diverticulum. Small bowel is otherwise unremarkable. Extensive colonic diverticulosis. Normal terminal ileum. Vascular/Lymphatic: Advanced aortic and branch vessel atherosclerosis. No abdominopelvic adenopathy. Reproductive: Prostatomegaly, with median lobe impression into the urinary bladder. Other: No significant free fluid. Musculoskeletal: New right ischial sclerosis on image 129/2. New left iliac wing sclerosis on image 96/2. Sclerosis involving the T1 vertebral body is mild. New since 02/25/2018. IMPRESSION: 1. Response to therapy. Resolution of right upper lobe lung mass, decrease and resolution of lower cervical/thoracic adenopathy, improvement in  adrenal, renal and perirenal metastasis. 2. Healing of osseous metastasis with interval development of a mild compression deformity at L1. 3. Decrease in small right pleural effusion. Coronary artery atherosclerosis. Aortic Atherosclerosis (ICD10-I70.0). 4. Cholelithiasis. 5. Pulmonary artery enlargement suggests pulmonary arterial hypertension. Electronically Signed   By: Abigail Miyamoto M.D.   On: 05/06/2018 16:33   Ct Abdomen Pelvis W Contrast  Result Date: 05/06/2018 CLINICAL DATA:  Small-cell lung cancer diagnosed earlier this year. Radiation therapy in progress. Shortness of breath. Constipation and diarrhea. EXAM: CT CHEST, ABDOMEN, AND PELVIS WITH CONTRAST TECHNIQUE: Multidetector CT imaging of the chest, abdomen and pelvis was performed following the standard protocol during bolus administration of intravenous contrast. CONTRAST:  156mL OMNIPAQUE IOHEXOL 300 MG/ML  SOLN COMPARISON:  03/31/2018 PET. 03/08/2018 chest CT. Stone study of 02/25/2018 FINDINGS: CT CHEST FINDINGS Cardiovascular: Aortic and branch vessel atherosclerosis. Extensive ulcerative plaque in the descending thoracic aorta. Tortuous thoracic aorta. Mild cardiomegaly with minimal pericardial fluid, similar. Multivessel coronary artery atherosclerosis. No central pulmonary embolism, on this non-dedicated study. Pulmonary artery enlargement, outflow tract 3.2 cm Mediastinum/Nodes: Bilateral low cervical/supraclavicular adenopathy has markedly improved to resolved. A low right jugular index node measures 1.0 cm today versus 2.5 cm on the prior PET. Low-density bilateral thyroid nodules are of doubtful clinical significance. Index right paratracheal node measures 1.4 cm on image 27/2 versus 2.9 cm on the prior. Right hilar adenopathy is improved, including at 1.6 cm on image 31/2 today. A prevascular node measures 1.2 cm today versus 2.8 cm on the prior PET. Lungs/Pleura: Similar small right pleural effusion. Trace left pleural thickening or  fluid, decreased since the prior PET. Azygos fissure.  Mild centrilobular emphysema. The right upper lobe lung lesion described on the prior exam has resolved. There is only mild interstitial thickening remaining in the posterior right upper lobe, primarily peripherally on image 62/7. Musculoskeletal: Remote left-sided rib trauma. Partial healing of comminuted pathologic fracture involving the right scapula. No glenoid extension. Sclerosis involving the T3 vertebral body and less so T9 vertebral bodies. CT ABDOMEN PELVIS FINDINGS Hepatobiliary: No focal liver lesion. Gallstones up to 8 mm, without acute cholecystitis or biliary duct dilatation. Pancreas: Mild pancreatic atrophy, without duct dilatation. Spleen: Splenic calcifications suggest old granulomatous disease. Adrenals/Urinary Tract: Normal right adrenal gland. Left adrenal nodules of maximally 1.6 cm today versus 2.5 cm on the prior. Decreased size of an exophytic upper pole left renal lesion, currently 9 mm on image 67/2.  1.8 cm on the prior. An upper pole exophytic right renal lesion measures 1.2 cm today versus 2.1 cm on the prior. Bilateral nodularity within the perinephric fat and along the renal fascia. An index medial left perinephric nodule measures 1.6 cm on image 84/2 versus 2.2 cm on the prior. No hydronephrosis. Portions of the distal ureters are prominent, but no obstructive cause is seen. This is most likely related to mild bladder distension. Stomach/Bowel: Portions of the stomach are underdistended. transverse duodenal diverticulum. Small bowel is otherwise unremarkable. Extensive colonic diverticulosis. Normal terminal ileum. Vascular/Lymphatic: Advanced aortic and branch vessel atherosclerosis. No abdominopelvic adenopathy. Reproductive: Prostatomegaly, with median lobe impression into the urinary bladder. Other: No significant free fluid. Musculoskeletal: New right ischial sclerosis on image 129/2. New left iliac wing sclerosis on image  96/2. Sclerosis involving the T1 vertebral body is mild. New since 02/25/2018. IMPRESSION: 1. Response to therapy. Resolution of right upper lobe lung mass, decrease and resolution of lower cervical/thoracic adenopathy, improvement in adrenal, renal and perirenal metastasis. 2. Healing of osseous metastasis with interval development of a mild compression deformity at L1. 3. Decrease in small right pleural effusion. Coronary artery atherosclerosis. Aortic Atherosclerosis (ICD10-I70.0). 4. Cholelithiasis. 5. Pulmonary artery enlargement suggests pulmonary arterial hypertension. Electronically Signed   By: Abigail Miyamoto M.D.   On: 05/06/2018 16:33    ASSESSMENT AND PLAN: This is a very pleasant 77 years old white male recently diagnosed with extensive stage (T2b, N3, M1c) small cell lung cancer presented with right upper lobe lung mass in addition to right mediastinal and supraclavicular lymphadenopathy as well as metastatic disease to the retroperitoneal lymph nodes, left adrenal gland as well as bone disease in the right scapula diagnosed in August 2019. The patient was a started on systemic chemotherapy with carboplatin, etoposide and Tecentriq status post 3 cycles.  The patient continues to tolerate this treatment well with no concerning adverse effects. I recommended for him to proceed with cycle #4 today as scheduled. I will see the patient back for follow-up visit in 3 weeks for evaluation after repeating CT scan of the chest, abdomen and pelvis for restaging of his disease. For the chemotherapy-induced anemia, I will arrange for the patient to receive 2 units of PRBCs transfusion this week. The patient was advised to call immediately if he has any concerning symptoms in the interval. The patient voices understanding of current disease status and treatment options and is in agreement with the current care plan. All questions were answered. The patient knows to call the clinic with any problems,  questions or concerns. We can certainly see the patient much sooner if necessary.  I spent 10 minutes counseling the patient face to face. The total time spent in the appointment was 15 minutes.  Disclaimer: This note was dictated with voice recognition software. Similar sounding words can inadvertently be transcribed and may not be corrected upon review.

## 2018-05-30 NOTE — Telephone Encounter (Signed)
Appts already scheduled per 11/11 los - til the end of treatment plan - per pt they get their labs done weekly .

## 2018-05-31 ENCOUNTER — Inpatient Hospital Stay: Payer: Medicare Other

## 2018-05-31 ENCOUNTER — Telehealth: Payer: Self-pay | Admitting: Oncology

## 2018-05-31 ENCOUNTER — Telehealth: Payer: Self-pay | Admitting: *Deleted

## 2018-05-31 VITALS — BP 131/75 | HR 86 | Temp 98.3°F | Resp 17

## 2018-05-31 DIAGNOSIS — C3481 Malignant neoplasm of overlapping sites of right bronchus and lung: Secondary | ICD-10-CM | POA: Diagnosis not present

## 2018-05-31 DIAGNOSIS — C3491 Malignant neoplasm of unspecified part of right bronchus or lung: Secondary | ICD-10-CM

## 2018-05-31 LAB — PREPARE RBC (CROSSMATCH)

## 2018-05-31 MED ORDER — SODIUM CHLORIDE 0.9 % IV SOLN
Freq: Once | INTRAVENOUS | Status: AC
  Administered 2018-05-31: 13:00:00 via INTRAVENOUS
  Filled 2018-05-31: qty 250

## 2018-05-31 MED ORDER — SODIUM CHLORIDE 0.9 % IV SOLN
100.0000 mg/m2 | Freq: Once | INTRAVENOUS | Status: AC
  Administered 2018-05-31: 190 mg via INTRAVENOUS
  Filled 2018-05-31: qty 9.5

## 2018-05-31 MED ORDER — DEXAMETHASONE SODIUM PHOSPHATE 10 MG/ML IJ SOLN
10.0000 mg | Freq: Once | INTRAMUSCULAR | Status: AC
  Administered 2018-05-31: 10 mg via INTRAVENOUS

## 2018-05-31 MED ORDER — DEXAMETHASONE SODIUM PHOSPHATE 10 MG/ML IJ SOLN
INTRAMUSCULAR | Status: AC
  Filled 2018-05-31: qty 1

## 2018-05-31 NOTE — Patient Instructions (Signed)
[facility] Discharge Instructions for Patients Receiving Chemotherapy  Today you received the following chemotherapy agents: Etoposide (Vepesid)  To help prevent nausea and vomiting after your treatment, we encourage you to take your nausea medication as directed.    If you develop nausea and vomiting that is not controlled by your nausea medication, call the clinic.   BELOW ARE SYMPTOMS THAT SHOULD BE REPORTED IMMEDIATELY:  *FEVER GREATER THAN 100.5 F  *CHILLS WITH OR WITHOUT FEVER  NAUSEA AND VOMITING THAT IS NOT CONTROLLED WITH YOUR NAUSEA MEDICATION  *UNUSUAL SHORTNESS OF BREATH  *UNUSUAL BRUISING OR BLEEDING  TENDERNESS IN MOUTH AND THROAT WITH OR WITHOUT PRESENCE OF ULCERS  *URINARY PROBLEMS  *BOWEL PROBLEMS  UNUSUAL RASH Items with * indicate a potential emergency and should be followed up as soon as possible.  Feel free to call the clinic should you have any questions or concerns. The clinic phone number is [phone].  Please show the [facility] at check-in to the Emergency Department and triage nurse.

## 2018-05-31 NOTE — Telephone Encounter (Signed)
Medical records faxed to [facility]; release [id number]

## 2018-05-31 NOTE — Telephone Encounter (Signed)
Called patient regarding 11/11 sch msg lab is on 11/13 per Diane ok

## 2018-06-01 ENCOUNTER — Inpatient Hospital Stay: Payer: Medicare Other

## 2018-06-01 ENCOUNTER — Other Ambulatory Visit: Payer: Medicare Other

## 2018-06-01 VITALS — BP 157/86 | HR 84 | Temp 97.9°F | Resp 16

## 2018-06-01 DIAGNOSIS — C3481 Malignant neoplasm of overlapping sites of right bronchus and lung: Secondary | ICD-10-CM | POA: Diagnosis not present

## 2018-06-01 DIAGNOSIS — D649 Anemia, unspecified: Secondary | ICD-10-CM

## 2018-06-01 DIAGNOSIS — C3491 Malignant neoplasm of unspecified part of right bronchus or lung: Secondary | ICD-10-CM

## 2018-06-01 MED ORDER — SODIUM CHLORIDE 0.9 % IV SOLN
Freq: Once | INTRAVENOUS | Status: AC
  Administered 2018-06-01: 08:00:00 via INTRAVENOUS
  Filled 2018-06-01: qty 250

## 2018-06-01 MED ORDER — DEXAMETHASONE SODIUM PHOSPHATE 10 MG/ML IJ SOLN
INTRAMUSCULAR | Status: AC
  Filled 2018-06-01: qty 1

## 2018-06-01 MED ORDER — ACETAMINOPHEN 325 MG PO TABS
650.0000 mg | ORAL_TABLET | Freq: Once | ORAL | Status: AC
  Administered 2018-06-01: 650 mg via ORAL

## 2018-06-01 MED ORDER — DIPHENHYDRAMINE HCL 25 MG PO CAPS
ORAL_CAPSULE | ORAL | Status: AC
  Filled 2018-06-01: qty 1

## 2018-06-01 MED ORDER — ACETAMINOPHEN 325 MG PO TABS
ORAL_TABLET | ORAL | Status: AC
  Filled 2018-06-01: qty 2

## 2018-06-01 MED ORDER — DIPHENHYDRAMINE HCL 25 MG PO CAPS
25.0000 mg | ORAL_CAPSULE | Freq: Once | ORAL | Status: AC
  Administered 2018-06-01: 25 mg via ORAL

## 2018-06-01 MED ORDER — PEGFILGRASTIM 6 MG/0.6ML ~~LOC~~ PSKT
6.0000 mg | PREFILLED_SYRINGE | Freq: Once | SUBCUTANEOUS | Status: AC
  Administered 2018-06-01: 6 mg via SUBCUTANEOUS

## 2018-06-01 MED ORDER — SODIUM CHLORIDE 0.9% IV SOLUTION
250.0000 mL | Freq: Once | INTRAVENOUS | Status: AC
  Administered 2018-06-01: 250 mL via INTRAVENOUS
  Filled 2018-06-01: qty 250

## 2018-06-01 MED ORDER — DEXAMETHASONE SODIUM PHOSPHATE 10 MG/ML IJ SOLN
10.0000 mg | Freq: Once | INTRAMUSCULAR | Status: AC
  Administered 2018-06-01: 10 mg via INTRAVENOUS

## 2018-06-01 MED ORDER — SODIUM CHLORIDE 0.9 % IV SOLN
100.0000 mg/m2 | Freq: Once | INTRAVENOUS | Status: AC
  Administered 2018-06-01: 190 mg via INTRAVENOUS
  Filled 2018-06-01: qty 9.5

## 2018-06-01 MED ORDER — PEGFILGRASTIM 6 MG/0.6ML ~~LOC~~ PSKT
PREFILLED_SYRINGE | SUBCUTANEOUS | Status: AC
  Filled 2018-06-01: qty 0.6

## 2018-06-01 NOTE — Progress Notes (Signed)
ONPRO -approved by insurance and pt wants to take it due to transportation issues.

## 2018-06-01 NOTE — Patient Instructions (Signed)
Blood Transfusion, Care After This sheet gives you information about how to care for yourself after your procedure. Your doctor may also give you more specific instructions. If you have problems or questions, contact your doctor. Follow these instructions at home:  Take over-the-counter and prescription medicines only as told by your doctor.  Go back to your normal activities as told by your doctor.  Follow instructions from your doctor about how to take care of the area where an IV tube was put into your vein (insertion site). Make sure you: ? Wash your hands with soap and water before you change your bandage (dressing). If there is no soap and water, use hand sanitizer. ? Change your bandage as told by your doctor.  Check your IV insertion site every day for signs of infection. Check for: ? More redness, swelling, or pain. ? More fluid or blood. ? Warmth. ? Pus or a bad smell. Contact a doctor if:  You have more redness, swelling, or pain around the IV insertion site..  You have more fluid or blood coming from the IV insertion site.  Your IV insertion site feels warm to the touch.  You have pus or a bad smell coming from the IV insertion site.  Your pee (urine) turns pink, red, or brown.  You feel weak after doing your normal activities. Get help right away if:  You have signs of a serious allergic or body defense (immune) system reaction, including: ? Itchiness. ? Hives. ? Trouble breathing. ? Anxiety. ? Pain in your chest or lower back. ? Fever, flushing, and chills. ? Fast pulse. ? Rash. ? Watery poop (diarrhea). ? Throwing up (vomiting). ? Dark pee. ? Serious headache. ? Dizziness. ? Stiff neck. ? Yellow color in your face or the white parts of your eyes (jaundice). Summary  After a blood transfusion, return to your normal activities as told by your doctor.  Every day, check for signs of infection where the IV tube was put into your vein.  Some signs of  infection are warm skin, more redness and pain, more fluid or blood, and pus or a bad smell where the needle went in.  Contact your doctor if you feel weak or have any unusual symptoms. This information is not intended to replace advice given to you by your health care provider. Make sure you discuss any questions you have with your health care provider. Document Released: 07/27/2014 Document Revised: 02/28/2016 Document Reviewed: 02/28/2016 Elsevier Interactive Patient Education  2017 [facility] Discharge Instructions for Patients Receiving Chemotherapy  Today you received the following chemotherapy agents: Etoposide (Vepesid)  To help prevent nausea and vomiting after your treatment, we encourage you to take your nausea medication as directed.    If you develop nausea and vomiting that is not controlled by your nausea medication, call the clinic.   BELOW ARE SYMPTOMS THAT SHOULD BE REPORTED IMMEDIATELY:  *FEVER GREATER THAN 100.5 F  *CHILLS WITH OR WITHOUT FEVER  NAUSEA AND VOMITING THAT IS NOT CONTROLLED WITH YOUR NAUSEA MEDICATION  *UNUSUAL SHORTNESS OF BREATH  *UNUSUAL BRUISING OR BLEEDING  TENDERNESS IN MOUTH AND THROAT WITH OR WITHOUT PRESENCE OF ULCERS  *URINARY PROBLEMS  *BOWEL PROBLEMS  UNUSUAL RASH Items with * indicate a potential emergency and should be followed up as soon as possible.  Feel free to call the clinic should you have any questions or concerns. The clinic phone number is [phone].  Please show the [facility] at check-in to the Emergency Department and triage nurse.

## 2018-06-02 LAB — TYPE AND SCREEN
ABO/RH(D): A NEG
Antibody Screen: NEGATIVE
UNIT DIVISION: 0
Unit division: 0

## 2018-06-02 LAB — BPAM RBC
BLOOD PRODUCT EXPIRATION DATE: 201912122359
Blood Product Expiration Date: 201912072359
ISSUE DATE / TIME: 201911130928
ISSUE DATE / TIME: 201911130928
UNIT TYPE AND RH: 600
Unit Type and Rh: 600

## 2018-06-03 ENCOUNTER — Inpatient Hospital Stay: Payer: Medicare Other

## 2018-06-03 ENCOUNTER — Encounter (HOSPITAL_COMMUNITY): Payer: Medicare Other

## 2018-06-14 ENCOUNTER — Ambulatory Visit (HOSPITAL_COMMUNITY)
Admission: RE | Admit: 2018-06-14 | Discharge: 2018-06-14 | Disposition: A | Discharge status: Home / Self Care | Payer: Medicare Other | Source: Ambulatory Visit | Attending: Internal Medicine | Admitting: Internal Medicine

## 2018-06-14 DIAGNOSIS — C3491 Malignant neoplasm of unspecified part of right bronchus or lung: Secondary | ICD-10-CM | POA: Insufficient documentation

## 2018-06-14 MED ORDER — IOHEXOL 300 MG/ML  SOLN
100.0000 mL | Freq: Once | INTRAMUSCULAR | Status: AC | PRN
  Administered 2018-06-14: 100 mL via INTRAVENOUS

## 2018-06-14 MED ORDER — IOHEXOL 300 MG/ML  SOLN
30.0000 mL | Freq: Once | INTRAMUSCULAR | Status: AC | PRN
  Administered 2018-06-14: 30 mL via ORAL

## 2018-06-14 MED ORDER — SODIUM CHLORIDE (PF) 0.9 % IJ SOLN
INTRAMUSCULAR | Status: AC
  Filled 2018-06-14: qty 50

## 2018-06-17 ENCOUNTER — Ambulatory Visit (HOSPITAL_COMMUNITY): Payer: Medicare Other

## 2018-06-20 ENCOUNTER — Inpatient Hospital Stay (HOSPITAL_BASED_OUTPATIENT_CLINIC_OR_DEPARTMENT_OTHER): Payer: Medicare Other | Admitting: Oncology

## 2018-06-20 ENCOUNTER — Inpatient Hospital Stay: Payer: Medicare Other

## 2018-06-20 ENCOUNTER — Encounter: Payer: Self-pay | Admitting: Oncology

## 2018-06-20 ENCOUNTER — Inpatient Hospital Stay: Payer: Medicare Other | Admitting: Nutrition

## 2018-06-20 ENCOUNTER — Inpatient Hospital Stay: Payer: Medicare Other | Attending: Internal Medicine

## 2018-06-20 ENCOUNTER — Telehealth: Payer: Self-pay | Admitting: Oncology

## 2018-06-20 VITALS — BP 141/77 | HR 94 | Temp 97.9°F | Resp 18 | Ht 69.0 in | Wt 169.9 lb

## 2018-06-20 DIAGNOSIS — C3491 Malignant neoplasm of unspecified part of right bronchus or lung: Secondary | ICD-10-CM

## 2018-06-20 DIAGNOSIS — K219 Gastro-esophageal reflux disease without esophagitis: Secondary | ICD-10-CM | POA: Insufficient documentation

## 2018-06-20 DIAGNOSIS — C7972 Secondary malignant neoplasm of left adrenal gland: Secondary | ICD-10-CM | POA: Diagnosis not present

## 2018-06-20 DIAGNOSIS — I700 Atherosclerosis of aorta: Secondary | ICD-10-CM

## 2018-06-20 DIAGNOSIS — Z9221 Personal history of antineoplastic chemotherapy: Secondary | ICD-10-CM | POA: Insufficient documentation

## 2018-06-20 DIAGNOSIS — Z79899 Other long term (current) drug therapy: Secondary | ICD-10-CM

## 2018-06-20 DIAGNOSIS — I714 Abdominal aortic aneurysm, without rupture: Secondary | ICD-10-CM | POA: Insufficient documentation

## 2018-06-20 DIAGNOSIS — E042 Nontoxic multinodular goiter: Secondary | ICD-10-CM | POA: Diagnosis not present

## 2018-06-20 DIAGNOSIS — I129 Hypertensive chronic kidney disease with stage 1 through stage 4 chronic kidney disease, or unspecified chronic kidney disease: Secondary | ICD-10-CM

## 2018-06-20 DIAGNOSIS — T451X5A Adverse effect of antineoplastic and immunosuppressive drugs, initial encounter: Secondary | ICD-10-CM | POA: Diagnosis not present

## 2018-06-20 DIAGNOSIS — Z7689 Persons encountering health services in other specified circumstances: Secondary | ICD-10-CM | POA: Diagnosis not present

## 2018-06-20 DIAGNOSIS — C3481 Malignant neoplasm of overlapping sites of right bronchus and lung: Secondary | ICD-10-CM

## 2018-06-20 DIAGNOSIS — I313 Pericardial effusion (noninflammatory): Secondary | ICD-10-CM

## 2018-06-20 DIAGNOSIS — I2510 Atherosclerotic heart disease of native coronary artery without angina pectoris: Secondary | ICD-10-CM

## 2018-06-20 DIAGNOSIS — I252 Old myocardial infarction: Secondary | ICD-10-CM

## 2018-06-20 DIAGNOSIS — Z5112 Encounter for antineoplastic immunotherapy: Secondary | ICD-10-CM | POA: Insufficient documentation

## 2018-06-20 DIAGNOSIS — E785 Hyperlipidemia, unspecified: Secondary | ICD-10-CM | POA: Insufficient documentation

## 2018-06-20 DIAGNOSIS — D6481 Anemia due to antineoplastic chemotherapy: Secondary | ICD-10-CM | POA: Diagnosis not present

## 2018-06-20 DIAGNOSIS — I4891 Unspecified atrial fibrillation: Secondary | ICD-10-CM | POA: Diagnosis not present

## 2018-06-20 DIAGNOSIS — N189 Chronic kidney disease, unspecified: Secondary | ICD-10-CM | POA: Insufficient documentation

## 2018-06-20 DIAGNOSIS — Z7982 Long term (current) use of aspirin: Secondary | ICD-10-CM

## 2018-06-20 DIAGNOSIS — Z87442 Personal history of urinary calculi: Secondary | ICD-10-CM

## 2018-06-20 DIAGNOSIS — C7951 Secondary malignant neoplasm of bone: Secondary | ICD-10-CM | POA: Diagnosis not present

## 2018-06-20 DIAGNOSIS — Z5111 Encounter for antineoplastic chemotherapy: Secondary | ICD-10-CM

## 2018-06-20 DIAGNOSIS — R5382 Chronic fatigue, unspecified: Secondary | ICD-10-CM

## 2018-06-20 DIAGNOSIS — M129 Arthropathy, unspecified: Secondary | ICD-10-CM | POA: Diagnosis not present

## 2018-06-20 DIAGNOSIS — E1151 Type 2 diabetes mellitus with diabetic peripheral angiopathy without gangrene: Secondary | ICD-10-CM | POA: Diagnosis not present

## 2018-06-20 LAB — TSH: TSH: 2.927 u[IU]/mL (ref 0.320–4.118)

## 2018-06-20 LAB — CBC WITH DIFFERENTIAL (CANCER CENTER ONLY)
Abs Immature Granulocytes: 0.09 10*3/uL — ABNORMAL HIGH (ref 0.00–0.07)
BASOS ABS: 0 10*3/uL (ref 0.0–0.1)
Basophils Relative: 0 %
EOS ABS: 0 10*3/uL (ref 0.0–0.5)
Eosinophils Relative: 0 %
HEMATOCRIT: 30 % — AB (ref 39.0–52.0)
HEMOGLOBIN: 9.5 g/dL — AB (ref 13.0–17.0)
IMMATURE GRANULOCYTES: 1 %
LYMPHS ABS: 1.3 10*3/uL (ref 0.7–4.0)
LYMPHS PCT: 12 %
MCH: 32.4 pg (ref 26.0–34.0)
MCHC: 31.7 g/dL (ref 30.0–36.0)
MCV: 102.4 fL — ABNORMAL HIGH (ref 80.0–100.0)
Monocytes Absolute: 0.9 10*3/uL (ref 0.1–1.0)
Monocytes Relative: 8 %
NEUTROS PCT: 79 %
NRBC: 0 % (ref 0.0–0.2)
Neutro Abs: 8.4 10*3/uL — ABNORMAL HIGH (ref 1.7–7.7)
Platelet Count: 168 10*3/uL (ref 150–400)
RBC: 2.93 MIL/uL — AB (ref 4.22–5.81)
RDW: 20.9 % — AB (ref 11.5–15.5)
WBC Count: 10.7 10*3/uL — ABNORMAL HIGH (ref 4.0–10.5)

## 2018-06-20 LAB — CMP (CANCER CENTER ONLY)
ALT: 15 U/L (ref 0–44)
ANION GAP: 13 (ref 5–15)
AST: 17 U/L (ref 15–41)
Albumin: 3.6 g/dL (ref 3.5–5.0)
Alkaline Phosphatase: 130 U/L — ABNORMAL HIGH (ref 38–126)
BUN: 11 mg/dL (ref 8–23)
CHLORIDE: 106 mmol/L (ref 98–111)
CO2: 22 mmol/L (ref 22–32)
Calcium: 9 mg/dL (ref 8.9–10.3)
Creatinine: 0.98 mg/dL (ref 0.61–1.24)
GFR, Estimated: >60 mL/min (ref >60)
Glucose, Bld: 160 mg/dL — ABNORMAL HIGH (ref 70–99)
Potassium: 4.3 mmol/L (ref 3.5–5.1)
SODIUM: 141 mmol/L (ref 135–145)
Total Bilirubin: 0.5 mg/dL (ref 0.3–1.2)
Total Protein: 7 g/dL (ref 6.5–8.1)

## 2018-06-20 MED ORDER — PALONOSETRON HCL INJECTION 0.25 MG/5ML
INTRAVENOUS | Status: AC
  Filled 2018-06-20: qty 5

## 2018-06-20 MED ORDER — SODIUM CHLORIDE 0.9 % IV SOLN
443.5000 mg | Freq: Once | INTRAVENOUS | Status: AC
  Administered 2018-06-20: 440 mg via INTRAVENOUS
  Filled 2018-06-20: qty 44

## 2018-06-20 MED ORDER — SODIUM CHLORIDE 0.9 % IV SOLN
100.0000 mg/m2 | Freq: Once | INTRAVENOUS | Status: AC
  Administered 2018-06-20: 190 mg via INTRAVENOUS
  Filled 2018-06-20: qty 9.5

## 2018-06-20 MED ORDER — SODIUM CHLORIDE 0.9 % IV SOLN
Freq: Once | INTRAVENOUS | Status: AC
  Administered 2018-06-20: 13:00:00 via INTRAVENOUS
  Filled 2018-06-20: qty 250

## 2018-06-20 MED ORDER — PALONOSETRON HCL INJECTION 0.25 MG/5ML
0.2500 mg | Freq: Once | INTRAVENOUS | Status: AC
  Administered 2018-06-20: 0.25 mg via INTRAVENOUS

## 2018-06-20 MED ORDER — SODIUM CHLORIDE 0.9 % IV SOLN
1200.0000 mg | Freq: Once | INTRAVENOUS | Status: AC
  Administered 2018-06-20: 1200 mg via INTRAVENOUS
  Filled 2018-06-20: qty 20

## 2018-06-20 MED ORDER — SODIUM CHLORIDE 0.9 % IV SOLN
Freq: Once | INTRAVENOUS | Status: AC
  Administered 2018-06-20: 13:00:00 via INTRAVENOUS
  Filled 2018-06-20: qty 5

## 2018-06-20 NOTE — Patient Instructions (Signed)
[facility] Discharge Instructions for Patients Receiving Chemotherapy  Today you received the following chemotherapy agents :  Tecentriq, Carboplatin, Etoposide.  To help prevent nausea and vomiting after your treatment, we encourage you to take your nausea medication as prescribed.   If you develop nausea and vomiting that is not controlled by your nausea medication, call the clinic.   BELOW ARE SYMPTOMS THAT SHOULD BE REPORTED IMMEDIATELY:  *FEVER GREATER THAN 100.5 F  *CHILLS WITH OR WITHOUT FEVER  NAUSEA AND VOMITING THAT IS NOT CONTROLLED WITH YOUR NAUSEA MEDICATION  *UNUSUAL SHORTNESS OF BREATH  *UNUSUAL BRUISING OR BLEEDING  TENDERNESS IN MOUTH AND THROAT WITH OR WITHOUT PRESENCE OF ULCERS  *URINARY PROBLEMS  *BOWEL PROBLEMS  UNUSUAL RASH Items with * indicate a potential emergency and should be followed up as soon as possible.  Feel free to call the clinic should you have any questions or concerns. The clinic phone number is [phone].  Please show the [facility] at check-in to the Emergency Department and triage nurse.

## 2018-06-20 NOTE — Progress Notes (Signed)
[facility] OFFICE PROGRESS NOTE  Dione Housekeeper, MD [place] [address]  DIAGNOSIS: Extensive stage (T2b, N3, M1c) small cell lung cancer,presented with large right upper lobe lung mass with extension to the right hilum in addition to the external and supraclavicular lymphadenopathy as well as metastatic disease to the retroperitoneal lymph nodes and left adrenal gland and suspicious right scapular bone lesion diagnosed in August 2019  PRIOR THERAPY: None.  CURRENT THERAPY: Systemic chemotherapy with carboplatin for AUC of 5 on day 1, etoposide 100 mg/M2 on days 1, 2 and 3 as well as Tecentriq 1200 mg IV every 3 weeks with Neulasta support.  First dose 03/28/2018.  Status post 4 cycles.  INTERVAL HISTORY: Arthur [name] 77 y.o. male returns for a routine follow-up visit accompanied by a family member.  The patient is feeling fine today and has no specific complaints except for shortness of breath with exertion.  This is unchanged from his baseline.  He received a blood transfusion approximately 3 weeks ago and did not notice much of a difference in his shortness of breath or his fatigue level.  He denies fevers and chills.  Denies chest pain, shortness of breath at rest, cough, hemoptysis.  Denies nausea, vomiting, constipation, diarrhea.  Denies recent weight loss or night sweats.  He had a restaging CT scan and is here to discuss the results.  MEDICAL HISTORY: Past Medical History:  Diagnosis Date  . Arthritis   . Ascending aortic aneurysm (HCC)    PER CT CHEST 03-08-18 IN [facility] , 4.0CM  . Chronic kidney disease   . Claudication ([place])   . Coronary artery disease 1997   CFX PCI, '97. Mod residual RI and RCA dis.  . Diabetes mellitus ([place])    IDDM  . Dyslipidemia   . Dyspnea   . GERD (gastroesophageal reflux disease)   . Hard of hearing   . History of bronchitis   . History of chemotherapy   . History of kidney stones 1958  . HTN (hypertension)    . Lacunar stroke ([place])   . Metastatic lung cancer (metastasis from lung to other site), unspecified laterality ([place])    DIAGNOSED 02-2018, MANAGED BY DR [place]  . NSTEMI (non-ST elevated myocardial infarction) ([place])   . PAD (peripheral artery disease) ([place])   . Pneumonia   . Tobacco abuse   . Urinary incontinence   . Wears glasses     ALLERGIES:  is allergic to codeine.  MEDICATIONS:  Current Outpatient Medications  Medication Sig Dispense Refill  . amLODipine (NORVASC) 5 MG tablet Take 1 tablet (5 mg total) by mouth daily. 180 tablet 3  . aspirin EC 81 MG EC tablet Take 1 tablet (81 mg total) by mouth daily. 30 tablet 1  . atorvastatin (LIPITOR) 20 MG tablet TAKE 1 TABLET (20 MG TOTAL) BY MOUTH DAILY. (Patient taking differently: Take 20 mg by mouth daily at 6 PM. ) 90 tablet 2  . clopidogrel (PLAVIX) 75 MG tablet Take 1 tablet (75 mg total) by mouth daily. 30 tablet 1  . docusate sodium (COLACE) 100 MG capsule Take 100 mg by mouth 2 (two) times daily.    Marland Kitchen doxazosin (CARDURA) 1 MG tablet Take 3 mg by mouth at bedtime. Pt takes 3 at bedtime     . fish oil-omega-3 fatty acids 1000 MG capsule Take 2 g by mouth daily.     Marland Kitchen glucose blood (ONETOUCH VERIO) test strip CHECK BLOOD  SUGARS 3 DAILY:DX 250.40 One touch verio    . insulin lispro (HUMALOG) 100 UNIT/ML injection Inject 0.35 mLs (35 Units total) into the skin 2 (two) times daily. (Patient taking differently: Inject 25-40 Units into the skin 2 (two) times daily at 10 AM and 5 PM. ) 10 mL 11  . isosorbide mononitrate (IMDUR) 30 MG 24 hr tablet Take 1 tablet (30 mg total) by mouth daily. 90 tablet 1  . meloxicam (MOBIC) 7.5 MG tablet Take 7.5 mg by mouth daily.    . metoprolol tartrate (LOPRESSOR) 50 MG tablet TAKE 1 TABLET (50 MG TOTAL) BY MOUTH 2 (TWO) TIMES DAILY. KEEP OV. 180 tablet 0  . Misc. Devices MISC 1 wheelchair for mobility.    . Nutritional Supplements (FEEDING SUPPLEMENT, GLUCERNA 1.2 CAL,) LIQD Take as  directed by physician    . oxybutynin (DITROPAN) 5 MG tablet Take 5 mg by mouth daily.     . pantoprazole (PROTONIX) 40 MG tablet Take 1 tablet (40 mg total) by mouth daily. 30 tablet 1  . polyethylene glycol (MIRALAX / GLYCOLAX) packet Take 17 g by mouth 2 (two) times daily.    . prochlorperazine (COMPAZINE) 10 MG tablet Take 1 tablet (10 mg total) by mouth every 6 (six) hours as needed for nausea or vomiting. 30 tablet 0  . senna-docusate (SENOKOT-S) 8.6-50 MG tablet Take 1 tablet by mouth 2 (two) times daily. While taking strong pain meds to prevent constipation 20 tablet 0  . Sennosides (EX-LAX PO) Take 2 tablets by mouth daily as needed (constipation).    . traMADol (ULTRAM) 50 MG tablet Take 1-2 tablets (50-100 mg total) by mouth every 6 (six) hours as needed for moderate pain or severe pain. Post-operatively 20 tablet 0  . nitroGLYCERIN (NITROSTAT) 0.4 MG SL tablet Place 1 tablet (0.4 mg total) under the tongue every 5 (five) minutes x 3 doses as needed for chest pain. (Patient not taking: Reported on 06/20/2018) 25 tablet 4   No current facility-administered medications for this visit.    Facility-Administered Medications Ordered in Other Visits  Medication Dose Route Frequency Provider Last Rate Last Dose  . CARBOplatin (PARAPLATIN) 440 mg in sodium chloride 0.9 % 250 mL chemo infusion  440 mg Intravenous Once Curt Bears, MD      . etoposide (VEPESID) 190 mg in sodium chloride 0.9 % 500 mL chemo infusion  100 mg/m2 (Treatment Plan Recorded) Intravenous Once Curt Bears, MD        SURGICAL HISTORY:  Past Surgical History:  Procedure Laterality Date  . APPENDECTOMY     "busted on me"  . COLONOSCOPY    . CORONARY ANGIOPLASTY  1997   CFX  . CYSTOSCOPY WITH RETROGRADE PYELOGRAM, URETEROSCOPY AND STENT PLACEMENT Right 04/13/2018   Procedure: CYSTOSCOPY WITH RETROGRADE PYELOGRAM, URETEROSCOPY AND STENT PLACEMENT;  Surgeon: Alexis Frock, MD;  Location: [facility];  Service:  Urology;  Laterality: Right;  1 HR  . ENDARTERECTOMY FEMORAL Right 05/13/2016   iliofemoral endarterectomy with bovine pericardial patch angioplasty  . ENDARTERECTOMY FEMORAL Right 05/13/2016   Procedure: RIGHT ENDARTERECTOMY FEMORAL;  Surgeon: Serafina Mitchell, MD;  Location: [facility];  Service: Vascular;  Laterality: Right;  . HOLMIUM LASER APPLICATION Right 3/87/5643   Procedure: HOLMIUM LASER APPLICATION;  Surgeon: Alexis Frock, MD;  Location: [facility];  Service: Urology;  Laterality: Right;  . PATCH ANGIOPLASTY Right 05/13/2016   Procedure: [place];  Surgeon: Serafina Mitchell, MD;  Location: [facility];  Service:  Vascular;  Laterality: Right;  . PERIPHERAL VASCULAR CATHETERIZATION N/A 02/20/2016   Procedure: Lower Extremity Angiography;  Surgeon: Lorretta Harp, MD;  Location: [facility];  Service: Cardiovascular;  Laterality: N/A;    REVIEW OF SYSTEMS:   Review of Systems  Constitutional: Negative for appetite change, chills, fever and unexpected weight change.  Positive for mild fatigue. HENT:   Negative for mouth sores, nosebleeds, sore throat and trouble swallowing.   Eyes: Negative for eye problems and icterus.  Respiratory: Negative for cough, hemoptysis, and wheezing.  Positive for shortness of breath with exertion. Cardiovascular: Negative for chest pain and leg swelling.  Gastrointestinal: Negative for abdominal pain, constipation, diarrhea, nausea and vomiting.  Genitourinary: Negative for bladder incontinence, difficulty urinating, dysuria, frequency and hematuria.   Musculoskeletal: Negative for back pain, gait problem, neck pain and neck stiffness.  Skin: Negative for itching and rash.  Neurological: Negative for dizziness, extremity weakness, gait problem, headaches, light-headedness and seizures.  Hematological: Negative for adenopathy. Does not bruise/bleed easily.  Psychiatric/Behavioral: Negative for confusion, depression and  sleep disturbance. The patient is not nervous/anxious.     PHYSICAL EXAMINATION:  Blood pressure (!) 141/77, pulse 94, temperature 97.9 F (36.6 C), temperature source Oral, resp. rate 18, height 5\' 9"  (1.753 m), weight 169 lb 14.4 oz (77.1 kg), SpO2 96 %.  ECOG PERFORMANCE STATUS: 1 - Symptomatic but completely ambulatory  Physical Exam  Constitutional: Oriented to person, place, and time and well-developed, well-nourished, and in no distress. No distress.  HENT:  Head: Normocephalic and atraumatic.  Mouth/Throat: Oropharynx is clear and moist. No oropharyngeal exudate.  Eyes: Conjunctivae are normal. Right eye exhibits no discharge. Left eye exhibits no discharge. No scleral icterus.  Neck: Normal range of motion. Neck supple.  Cardiovascular: Normal rate, regular rhythm, normal heart sounds and intact distal pulses.   Pulmonary/Chest: Effort normal and breath sounds normal. No respiratory distress. No wheezes. No rales.  Abdominal: Soft. Bowel sounds are normal. Exhibits no distension and no mass. There is no tenderness.  Musculoskeletal: Normal range of motion. Exhibits no edema.  Lymphadenopathy:    No cervical adenopathy.  Neurological: Alert and oriented to person, place, and time. Exhibits normal muscle tone. Gait normal. Coordination normal.  Skin: Skin is warm and dry. No rash noted. Not diaphoretic. No erythema. No pallor.  Psychiatric: Mood, memory and judgment normal.  Vitals reviewed.  LABORATORY DATA: Lab Results  Component Value Date   WBC 10.7 (H) 06/20/2018   HGB 9.5 (L) 06/20/2018   HCT 30.0 (L) 06/20/2018   MCV 102.4 (H) 06/20/2018   PLT 168 06/20/2018      Chemistry      Component Value Date/Time   NA 141 06/20/2018 0902   NA 133 (L) 03/30/2018 0829   K 4.3 06/20/2018 0902   CL 106 06/20/2018 0902   CO2 22 06/20/2018 0902   BUN 11 06/20/2018 0902   BUN 31 (H) 03/30/2018 0829   CREATININE 0.98 06/20/2018 0902   CREATININE 1.46 (H) 02/27/2016 0820       Component Value Date/Time   CALCIUM 9.0 06/20/2018 0902   ALKPHOS 130 (H) 06/20/2018 0902   AST 17 06/20/2018 0902   ALT 15 06/20/2018 0902   BILITOT 0.5 06/20/2018 0902       RADIOGRAPHIC STUDIES:  Ct Chest W Contrast  Result Date: 06/14/2018 CLINICAL DATA:  Small-cell lung cancer. EXAM: CT CHEST, ABDOMEN, AND PELVIS WITH CONTRAST TECHNIQUE: Multidetector CT imaging of the chest, abdomen and pelvis was  performed following the standard protocol during bolus administration of intravenous contrast. CONTRAST:  145mL OMNIPAQUE IOHEXOL 300 MG/ML  SOLN COMPARISON:  05/06/2018 FINDINGS: CT CHEST FINDINGS Cardiovascular: Heart is enlarged. Small pericardial effusion is stable. Coronary artery calcification is evident. Atherosclerotic calcification is noted in the wall of the thoracic aorta. Mediastinum/Nodes: No supraclavicular lymphadenopathy. The index low right jugular lymph node measured previously has resolved in the interval. Index right paratracheal node measured previously at 1.4 cm is 1.2 cm today. Right hilar lymph node measured previously at 1.6 cm short axis is 1.4 cm today (29/2). Anterior mediastinal lesion measured previously at 1.2 cm short axis is 0.9 cm today. Similar appearance bilateral thyroid nodules. The esophagus has normal imaging features. Lungs/Pleura: The central tracheobronchial airways are patent. Areas of collapse/consolidation in the right middle lobe and right lower lobe are progressive in the interval. Right pleural effusion has progressed since prior study and is now small to moderate in size. Trace left pleural effusion evident. Musculoskeletal: Similar appearance of nonacute right scapula fracture. Stable sclerosis at the T3 vertebral body with similar appearance of heterogeneous mineralization in T9 and T10. CT ABDOMEN PELVIS FINDINGS Hepatobiliary: No focal abnormality within the liver parenchyma. Calcified gallstones again noted with gallbladder wall thickening. No  intrahepatic or extrahepatic biliary dilation. Pancreas: Diffuse atrophy of pancreatic parenchyma without main ductal dilatation. Spleen: Calcified granulomata. Adrenals/Urinary Tract: No adrenal nodule or mass. Kidneys unremarkable. No evidence for hydroureter. The urinary bladder appears normal for the degree of distention. Stomach/Bowel: Stomach is nondistended. No gastric wall thickening. No evidence of outlet obstruction. Duodenum is normally positioned as is the ligament of Treitz. No small bowel wall thickening. No small bowel dilatation. The terminal ileum is normal. The appendix is not visualized, but there is no edema or inflammation in the region of the cecum. No gross colonic mass. No colonic wall thickening. Diverticuli are seen scattered along the entire length of the colon without CT findings of diverticulitis. Vascular/Lymphatic: There is abdominal aortic atherosclerosis without aneurysm. There is no gastrohepatic or hepatoduodenal ligament lymphadenopathy. No intraperitoneal or retroperitoneal lymphadenopathy. No pelvic sidewall lymphadenopathy. Reproductive: Prostate gland is enlarged with marked mass-effect on the bladder base. Other: No intraperitoneal free fluid. Musculoskeletal: Similar appearance of sclerosis posterior right iliac bone and left iliac crest. Diffuse sclerosis in the L1 vertebral body is progressed. IMPRESSION: 1. Interval decrease and mediastinal and right hilar lymphadenopathy. 2. Interval progression of right middle and lower lobe collapse/consolidation with progression of the right pleural effusion, now small to moderate in size. 3. Similar appearance of heterogeneous mineralization and sclerosis in the T3, T9, and T10 vertebral bodies. The L1 level shows progression diffuse sclerosis with some progression of vertebral body collapse. 4. Cholelithiasis with irregular gallbladder wall thickening. Cholecystitis not excluded. 5. Stable appearance of adrenal glands and kidneys  with no findings to suggest recurrent disease in these regions. 6.  Aortic Atherosclerois (ICD10-170.0) Electronically Signed   By: Misty Stanley M.D.   On: 06/14/2018 12:45   Ct Abdomen Pelvis W Contrast  Result Date: 06/14/2018 CLINICAL DATA:  Small-cell lung cancer. EXAM: CT CHEST, ABDOMEN, AND PELVIS WITH CONTRAST TECHNIQUE: Multidetector CT imaging of the chest, abdomen and pelvis was performed following the standard protocol during bolus administration of intravenous contrast. CONTRAST:  134mL OMNIPAQUE IOHEXOL 300 MG/ML  SOLN COMPARISON:  05/06/2018 FINDINGS: CT CHEST FINDINGS Cardiovascular: Heart is enlarged. Small pericardial effusion is stable. Coronary artery calcification is evident. Atherosclerotic calcification is noted in the wall of the thoracic aorta. Mediastinum/Nodes:  No supraclavicular lymphadenopathy. The index low right jugular lymph node measured previously has resolved in the interval. Index right paratracheal node measured previously at 1.4 cm is 1.2 cm today. Right hilar lymph node measured previously at 1.6 cm short axis is 1.4 cm today (29/2). Anterior mediastinal lesion measured previously at 1.2 cm short axis is 0.9 cm today. Similar appearance bilateral thyroid nodules. The esophagus has normal imaging features. Lungs/Pleura: The central tracheobronchial airways are patent. Areas of collapse/consolidation in the right middle lobe and right lower lobe are progressive in the interval. Right pleural effusion has progressed since prior study and is now small to moderate in size. Trace left pleural effusion evident. Musculoskeletal: Similar appearance of nonacute right scapula fracture. Stable sclerosis at the T3 vertebral body with similar appearance of heterogeneous mineralization in T9 and T10. CT ABDOMEN PELVIS FINDINGS Hepatobiliary: No focal abnormality within the liver parenchyma. Calcified gallstones again noted with gallbladder wall thickening. No intrahepatic or extrahepatic  biliary dilation. Pancreas: Diffuse atrophy of pancreatic parenchyma without main ductal dilatation. Spleen: Calcified granulomata. Adrenals/Urinary Tract: No adrenal nodule or mass. Kidneys unremarkable. No evidence for hydroureter. The urinary bladder appears normal for the degree of distention. Stomach/Bowel: Stomach is nondistended. No gastric wall thickening. No evidence of outlet obstruction. Duodenum is normally positioned as is the ligament of Treitz. No small bowel wall thickening. No small bowel dilatation. The terminal ileum is normal. The appendix is not visualized, but there is no edema or inflammation in the region of the cecum. No gross colonic mass. No colonic wall thickening. Diverticuli are seen scattered along the entire length of the colon without CT findings of diverticulitis. Vascular/Lymphatic: There is abdominal aortic atherosclerosis without aneurysm. There is no gastrohepatic or hepatoduodenal ligament lymphadenopathy. No intraperitoneal or retroperitoneal lymphadenopathy. No pelvic sidewall lymphadenopathy. Reproductive: Prostate gland is enlarged with marked mass-effect on the bladder base. Other: No intraperitoneal free fluid. Musculoskeletal: Similar appearance of sclerosis posterior right iliac bone and left iliac crest. Diffuse sclerosis in the L1 vertebral body is progressed. IMPRESSION: 1. Interval decrease and mediastinal and right hilar lymphadenopathy. 2. Interval progression of right middle and lower lobe collapse/consolidation with progression of the right pleural effusion, now small to moderate in size. 3. Similar appearance of heterogeneous mineralization and sclerosis in the T3, T9, and T10 vertebral bodies. The L1 level shows progression diffuse sclerosis with some progression of vertebral body collapse. 4. Cholelithiasis with irregular gallbladder wall thickening. Cholecystitis not excluded. 5. Stable appearance of adrenal glands and kidneys with no findings to suggest  recurrent disease in these regions. 6.  Aortic Atherosclerois (ICD10-170.0) Electronically Signed   By: Misty Stanley M.D.   On: 06/14/2018 12:45     ASSESSMENT/PLAN:  Small cell lung cancer, right [facility]) This is a very pleasant 77 year old white male recently diagnosed with extensive stage (T2b, N3, M1c) small cell lung cancer presented with right upper lobe lung mass in addition to right mediastinal and supraclavicular lymphadenopathy as well as metastatic disease to the retroperitoneal lymph nodes, left adrenal gland as well as bone disease in the right scapula diagnosed in August 2019. The patient was a started on systemic chemotherapy with carboplatin, etoposide and Tecentriq status post 4 cycles.  The patient continues to tolerate this treatment well with no concerning adverse effects.  He had a restaging CT scan of the chest, abdomen, pelvis and is here to discuss the results.  The patient was seen with Dr. Earlie Server.  CT scan results were discussed with the patient  and his family member which showed a decrease in the mediastinal and right hilar lymphadenopathy however, there was a slight increase in the right pleural effusion.  Treatment options were reviewed with the patient and his family member including continuing with carboplatin, etoposide, and Tecentriq for 2 additional cycles followed by maintenance Tecentriq versus discontinuing carboplatin and etoposide and starting maintenance Tecentriq at this time.  The patient is agreeable to receiving 2 additional cycles of chemotherapy with Tecentriq followed by maintenance treatment with Tecentriq alone.  We will proceed with cycle #5 today as scheduled.  The patient will continue to have weekly labs at his primary care provider's office.  He will follow-up in 3 weeks for evaluation prior to cycle #6.  For the right pleural effusion, he will let us know if you come some more symptomatic and we can set him up for thoracentesis.  The patient was  advised to call immediately if he has any concerning symptoms in the interval. The patient voices understanding of current disease status and treatment options and is in agreement with the current care plan. All questions were answered. The patient knows to call the clinic with any problems, questions or concerns. We can certainly see the patient much sooner if necessary.   No orders of the defined types were placed in this encounter.    Mikey Bussing, DNP, AGPCNP-BC, AOCNP 06/20/18   ADDENDUM: Hematology/Oncology Attending: I had a face-to-face encounter with the patient.  I recommended his care plan.  This is a very pleasant 77 years old white male with extensive stage small cell lung cancer.  He is currently undergoing systemic chemotherapy with carboplatin, etoposide and Tecentriq status post 4 cycles.  The patient has been tolerating this treatment well with no concerning adverse effect except for mild fatigue. He had repeat CT scan of the chest, abdomen and pelvis performed recently.  I personally and independently reviewed the scan and discussed the results with the patient and his family member.  The scan showed decrease in the size of the mediastinal and right hilar lymphadenopathy but there was mild increase in the right pleural effusion. I recommended for the patient to proceed with 2 more cycles of the same regimen and he will proceed with cycle #5 of his treatment today. The patient will come back for follow-up visit in 3 weeks for evaluation before starting cycle #6. If he has no evidence for disease progression after cycle #6, the patient will be considered for maintenance treatment with single agent Tecentriq. He was advised to call immediately if he has any concerning symptoms in the interval. Disclaimer: This note was dictated with voice recognition software. Similar sounding words can inadvertently be transcribed and may be missed upon review. Eilleen Kempf,  MD 06/21/18

## 2018-06-20 NOTE — Progress Notes (Signed)
Nutrition follow-up completed with patient during infusion. Patient is receiving treatment for small cell lung cancer. Weight improved and documented as 169.9 pounds December 2 increased from 158.8 pounds October 21. Labs reviewed and noted glucose 160 and albumin 3.6. Patient denies nausea and vomiting. He states he alternates between constipation and diarrhea. He is drinking Glucerna. He has no concerns or questions today.  Nutrition diagnosis: Unintended weight loss has resolved.  Educated patient to continue strategies for adequate calorie and protein intake.  Provided coupons for Glucerna.  Encourage patient to contact me if he develops questions or concerns.  No follow-up scheduled.  **Disclaimer: This note was dictated with voice recognition software. Similar sounding words can inadvertently be transcribed and this note may contain transcription errors which may not have been corrected upon publication of note.**

## 2018-06-20 NOTE — Telephone Encounter (Signed)
Scheduled appt per 12/2 los - pt to get an updated schedule next visit.

## 2018-06-20 NOTE — Assessment & Plan Note (Addendum)
This is a very pleasant 77 year old white male recently diagnosed with extensive stage (T2b, N3, M1c) small cell lung cancer presented with right upper lobe lung mass in addition to right mediastinal and supraclavicular lymphadenopathy as well as metastatic disease to the retroperitoneal lymph nodes, left adrenal gland as well as bone disease in the right scapula diagnosed in August 2019. The patient was a started on systemic chemotherapy with carboplatin, etoposide and Tecentriq status post 4 cycles.  The patient continues to tolerate this treatment well with no concerning adverse effects.  He had a restaging CT scan of the chest, abdomen, pelvis and is here to discuss the results.  The patient was seen with Dr. Earlie Server.  CT scan results were discussed with the patient and his family member which showed a decrease in the mediastinal and right hilar lymphadenopathy however, there was a slight increase in the right pleural effusion.  Treatment options were reviewed with the patient and his family member including continuing with carboplatin, etoposide, and Tecentriq for 2 additional cycles followed by maintenance Tecentriq versus discontinuing carboplatin and etoposide and starting maintenance Tecentriq at this time.  The patient is agreeable to receiving 2 additional cycles of chemotherapy with Tecentriq followed by maintenance treatment with Tecentriq alone.  We will proceed with cycle #5 today as scheduled.  The patient will continue to have weekly labs at his primary care provider's office.  He will follow-up in 3 weeks for evaluation prior to cycle #6.  For the right pleural effusion, he will let us know if you come some more symptomatic and we can set him up for thoracentesis.  The patient was advised to call immediately if he has any concerning symptoms in the interval. The patient voices understanding of current disease status and treatment options and is in agreement with the current care  plan. All questions were answered. The patient knows to call the clinic with any problems, questions or concerns. We can certainly see the patient much sooner if necessary.

## 2018-06-21 ENCOUNTER — Inpatient Hospital Stay: Payer: Medicare Other

## 2018-06-21 VITALS — BP 133/78 | HR 103 | Temp 98.0°F | Resp 17 | Wt 172.5 lb

## 2018-06-21 DIAGNOSIS — C3491 Malignant neoplasm of unspecified part of right bronchus or lung: Secondary | ICD-10-CM

## 2018-06-21 DIAGNOSIS — C3481 Malignant neoplasm of overlapping sites of right bronchus and lung: Secondary | ICD-10-CM | POA: Diagnosis not present

## 2018-06-21 MED ORDER — SODIUM CHLORIDE 0.9 % IV SOLN
Freq: Once | INTRAVENOUS | Status: AC
  Administered 2018-06-21: 12:00:00 via INTRAVENOUS
  Filled 2018-06-21: qty 250

## 2018-06-21 MED ORDER — SODIUM CHLORIDE 0.9 % IV SOLN
100.0000 mg/m2 | Freq: Once | INTRAVENOUS | Status: AC
  Administered 2018-06-21: 190 mg via INTRAVENOUS
  Filled 2018-06-21: qty 9.5

## 2018-06-21 MED ORDER — DEXAMETHASONE SODIUM PHOSPHATE 10 MG/ML IJ SOLN
10.0000 mg | Freq: Once | INTRAMUSCULAR | Status: AC
  Administered 2018-06-21: 10 mg via INTRAVENOUS

## 2018-06-21 MED ORDER — DEXAMETHASONE SODIUM PHOSPHATE 10 MG/ML IJ SOLN
INTRAMUSCULAR | Status: AC
  Filled 2018-06-21: qty 1

## 2018-06-21 NOTE — Patient Instructions (Signed)
[facility] Discharge Instructions for Patients Receiving Chemotherapy  Today you received the following chemotherapy agents: Etoposide (VEPESID).  To help prevent nausea and vomiting after your treatment, we encourage you to take your nausea medication as prescribed.   If you develop nausea and vomiting that is not controlled by your nausea medication, call the clinic.   BELOW ARE SYMPTOMS THAT SHOULD BE REPORTED IMMEDIATELY:  *FEVER GREATER THAN 100.5 F  *CHILLS WITH OR WITHOUT FEVER  NAUSEA AND VOMITING THAT IS NOT CONTROLLED WITH YOUR NAUSEA MEDICATION  *UNUSUAL SHORTNESS OF BREATH  *UNUSUAL BRUISING OR BLEEDING  TENDERNESS IN MOUTH AND THROAT WITH OR WITHOUT PRESENCE OF ULCERS  *URINARY PROBLEMS  *BOWEL PROBLEMS  UNUSUAL RASH Items with * indicate a potential emergency and should be followed up as soon as possible.  Feel free to call the clinic should you have any questions or concerns. The clinic phone number is [phone].  Please show the CHEMO ALERT CARD at check-in to the Emergency Department and triage nurse.   

## 2018-06-22 ENCOUNTER — Inpatient Hospital Stay: Payer: Medicare Other

## 2018-06-22 VITALS — BP 142/77 | HR 95 | Temp 97.7°F | Resp 16

## 2018-06-22 DIAGNOSIS — C3481 Malignant neoplasm of overlapping sites of right bronchus and lung: Secondary | ICD-10-CM | POA: Diagnosis not present

## 2018-06-22 DIAGNOSIS — C3491 Malignant neoplasm of unspecified part of right bronchus or lung: Secondary | ICD-10-CM

## 2018-06-22 MED ORDER — SODIUM CHLORIDE 0.9 % IV SOLN
Freq: Once | INTRAVENOUS | Status: AC
  Administered 2018-06-22: 11:00:00 via INTRAVENOUS
  Filled 2018-06-22: qty 250

## 2018-06-22 MED ORDER — DEXAMETHASONE SODIUM PHOSPHATE 10 MG/ML IJ SOLN
10.0000 mg | Freq: Once | INTRAMUSCULAR | Status: AC
  Administered 2018-06-22: 10 mg via INTRAVENOUS

## 2018-06-22 MED ORDER — SODIUM CHLORIDE 0.9 % IV SOLN
100.0000 mg/m2 | Freq: Once | INTRAVENOUS | Status: AC
  Administered 2018-06-22: 190 mg via INTRAVENOUS
  Filled 2018-06-22: qty 9.5

## 2018-06-22 MED ORDER — DEXAMETHASONE SODIUM PHOSPHATE 10 MG/ML IJ SOLN
INTRAMUSCULAR | Status: AC
  Filled 2018-06-22: qty 1

## 2018-06-22 MED ORDER — PEGFILGRASTIM 6 MG/0.6ML ~~LOC~~ PSKT
6.0000 mg | PREFILLED_SYRINGE | Freq: Once | SUBCUTANEOUS | Status: AC
  Administered 2018-06-22: 6 mg via SUBCUTANEOUS

## 2018-06-22 MED ORDER — PEGFILGRASTIM 6 MG/0.6ML ~~LOC~~ PSKT
PREFILLED_SYRINGE | SUBCUTANEOUS | Status: AC
  Filled 2018-06-22: qty 0.6

## 2018-06-22 NOTE — Patient Instructions (Signed)
[facility] Discharge Instructions for Patients Receiving Chemotherapy  Today you received the following chemotherapy agents: Etoposide (VEPESID).  To help prevent nausea and vomiting after your treatment, we encourage you to take your nausea medication as prescribed.   If you develop nausea and vomiting that is not controlled by your nausea medication, call the clinic.   BELOW ARE SYMPTOMS THAT SHOULD BE REPORTED IMMEDIATELY:  *FEVER GREATER THAN 100.5 F  *CHILLS WITH OR WITHOUT FEVER  NAUSEA AND VOMITING THAT IS NOT CONTROLLED WITH YOUR NAUSEA MEDICATION  *UNUSUAL SHORTNESS OF BREATH  *UNUSUAL BRUISING OR BLEEDING  TENDERNESS IN MOUTH AND THROAT WITH OR WITHOUT PRESENCE OF ULCERS  *URINARY PROBLEMS  *BOWEL PROBLEMS  UNUSUAL RASH Items with * indicate a potential emergency and should be followed up as soon as possible.  Feel free to call the clinic should you have any questions or concerns. The clinic phone number is [phone].  Please show the CHEMO ALERT CARD at check-in to the Emergency Department and triage nurse.   

## 2018-06-27 ENCOUNTER — Other Ambulatory Visit: Payer: Self-pay | Admitting: Cardiovascular Disease

## 2018-06-27 ENCOUNTER — Telehealth: Payer: Self-pay | Admitting: Cardiovascular Disease

## 2018-06-27 NOTE — Telephone Encounter (Signed)
Refill sent to the pharmacy electronically.  

## 2018-06-27 NOTE — Telephone Encounter (Signed)
New Message    *STAT* If patient is at the pharmacy, call can be transferred to refill team.   1. Which medications need to be refilled? (please list name of each medication and dose if known) metoprolol tartrate (LOPRESSOR) 50 MG tablet  2. Which pharmacy/location (including street and city if local pharmacy) is medication to be sent to? [id number] - [place], [place] - [address]  3. Do they need a 30 day or 90 day supply? [place]

## 2018-07-08 ENCOUNTER — Other Ambulatory Visit: Payer: Self-pay

## 2018-07-08 ENCOUNTER — Encounter (HOSPITAL_COMMUNITY): Payer: Self-pay | Admitting: Emergency Medicine

## 2018-07-08 ENCOUNTER — Emergency Department (HOSPITAL_COMMUNITY): Payer: Medicare Other

## 2018-07-08 ENCOUNTER — Inpatient Hospital Stay (HOSPITAL_COMMUNITY)
Admission: EM | Admit: 2018-07-08 | Discharge: 2018-07-11 | DRG: 291 | Disposition: A | Discharge status: Home / Self Care | Payer: Medicare Other | Attending: Internal Medicine | Admitting: Internal Medicine

## 2018-07-08 DIAGNOSIS — I110 Hypertensive heart disease with heart failure: Secondary | ICD-10-CM | POA: Diagnosis present

## 2018-07-08 DIAGNOSIS — N179 Acute kidney failure, unspecified: Secondary | ICD-10-CM | POA: Diagnosis present

## 2018-07-08 DIAGNOSIS — I10 Essential (primary) hypertension: Secondary | ICD-10-CM | POA: Diagnosis not present

## 2018-07-08 DIAGNOSIS — T451X5A Adverse effect of antineoplastic and immunosuppressive drugs, initial encounter: Secondary | ICD-10-CM | POA: Diagnosis present

## 2018-07-08 DIAGNOSIS — D696 Thrombocytopenia, unspecified: Secondary | ICD-10-CM

## 2018-07-08 DIAGNOSIS — I4891 Unspecified atrial fibrillation: Secondary | ICD-10-CM | POA: Diagnosis not present

## 2018-07-08 DIAGNOSIS — N189 Chronic kidney disease, unspecified: Secondary | ICD-10-CM | POA: Diagnosis present

## 2018-07-08 DIAGNOSIS — J9601 Acute respiratory failure with hypoxia: Secondary | ICD-10-CM

## 2018-07-08 DIAGNOSIS — Z794 Long term (current) use of insulin: Secondary | ICD-10-CM

## 2018-07-08 DIAGNOSIS — E1122 Type 2 diabetes mellitus with diabetic chronic kidney disease: Secondary | ICD-10-CM | POA: Diagnosis present

## 2018-07-08 DIAGNOSIS — D630 Anemia in neoplastic disease: Secondary | ICD-10-CM | POA: Diagnosis present

## 2018-07-08 DIAGNOSIS — R0602 Shortness of breath: Secondary | ICD-10-CM | POA: Diagnosis present

## 2018-07-08 DIAGNOSIS — Z7902 Long term (current) use of antithrombotics/antiplatelets: Secondary | ICD-10-CM

## 2018-07-08 DIAGNOSIS — J910 Malignant pleural effusion: Secondary | ICD-10-CM | POA: Diagnosis present

## 2018-07-08 DIAGNOSIS — I5043 Acute on chronic combined systolic (congestive) and diastolic (congestive) heart failure: Secondary | ICD-10-CM | POA: Diagnosis present

## 2018-07-08 DIAGNOSIS — Z87891 Personal history of nicotine dependence: Secondary | ICD-10-CM | POA: Diagnosis not present

## 2018-07-08 DIAGNOSIS — E1151 Type 2 diabetes mellitus with diabetic peripheral angiopathy without gangrene: Secondary | ICD-10-CM | POA: Diagnosis present

## 2018-07-08 DIAGNOSIS — D6959 Other secondary thrombocytopenia: Secondary | ICD-10-CM | POA: Diagnosis present

## 2018-07-08 DIAGNOSIS — D6481 Anemia due to antineoplastic chemotherapy: Secondary | ICD-10-CM | POA: Diagnosis present

## 2018-07-08 DIAGNOSIS — Z79899 Other long term (current) drug therapy: Secondary | ICD-10-CM

## 2018-07-08 DIAGNOSIS — Z885 Allergy status to narcotic agent status: Secondary | ICD-10-CM

## 2018-07-08 DIAGNOSIS — I480 Paroxysmal atrial fibrillation: Secondary | ICD-10-CM | POA: Diagnosis present

## 2018-07-08 DIAGNOSIS — E11649 Type 2 diabetes mellitus with hypoglycemia without coma: Secondary | ICD-10-CM | POA: Diagnosis present

## 2018-07-08 DIAGNOSIS — D649 Anemia, unspecified: Secondary | ICD-10-CM | POA: Diagnosis not present

## 2018-07-08 DIAGNOSIS — I739 Peripheral vascular disease, unspecified: Secondary | ICD-10-CM | POA: Diagnosis present

## 2018-07-08 DIAGNOSIS — I130 Hypertensive heart and chronic kidney disease with heart failure and stage 1 through stage 4 chronic kidney disease, or unspecified chronic kidney disease: Secondary | ICD-10-CM | POA: Diagnosis present

## 2018-07-08 DIAGNOSIS — I2510 Atherosclerotic heart disease of native coronary artery without angina pectoris: Secondary | ICD-10-CM | POA: Diagnosis present

## 2018-07-08 DIAGNOSIS — Z9861 Coronary angioplasty status: Secondary | ICD-10-CM

## 2018-07-08 DIAGNOSIS — C779 Secondary and unspecified malignant neoplasm of lymph node, unspecified: Secondary | ICD-10-CM | POA: Diagnosis present

## 2018-07-08 DIAGNOSIS — J90 Pleural effusion, not elsewhere classified: Secondary | ICD-10-CM | POA: Insufficient documentation

## 2018-07-08 DIAGNOSIS — C7951 Secondary malignant neoplasm of bone: Secondary | ICD-10-CM | POA: Diagnosis present

## 2018-07-08 DIAGNOSIS — D72829 Elevated white blood cell count, unspecified: Secondary | ICD-10-CM | POA: Diagnosis present

## 2018-07-08 DIAGNOSIS — Z9889 Other specified postprocedural states: Secondary | ICD-10-CM

## 2018-07-08 DIAGNOSIS — I959 Hypotension, unspecified: Secondary | ICD-10-CM | POA: Diagnosis present

## 2018-07-08 DIAGNOSIS — C3491 Malignant neoplasm of unspecified part of right bronchus or lung: Secondary | ICD-10-CM | POA: Diagnosis not present

## 2018-07-08 DIAGNOSIS — E785 Hyperlipidemia, unspecified: Secondary | ICD-10-CM | POA: Diagnosis present

## 2018-07-08 DIAGNOSIS — H9190 Unspecified hearing loss, unspecified ear: Secondary | ICD-10-CM | POA: Diagnosis present

## 2018-07-08 DIAGNOSIS — R531 Weakness: Secondary | ICD-10-CM | POA: Diagnosis present

## 2018-07-08 DIAGNOSIS — Z7982 Long term (current) use of aspirin: Secondary | ICD-10-CM

## 2018-07-08 DIAGNOSIS — I252 Old myocardial infarction: Secondary | ICD-10-CM

## 2018-07-08 DIAGNOSIS — J9811 Atelectasis: Secondary | ICD-10-CM | POA: Diagnosis present

## 2018-07-08 DIAGNOSIS — R5381 Other malaise: Secondary | ICD-10-CM | POA: Diagnosis present

## 2018-07-08 LAB — CBC WITH DIFFERENTIAL/PLATELET
Abs Immature Granulocytes: 0.81 10*3/uL — ABNORMAL HIGH (ref 0.00–0.07)
Basophils Absolute: 0.1 10*3/uL (ref 0.0–0.1)
Basophils Relative: 0 %
Eosinophils Absolute: 0 10*3/uL (ref 0.0–0.5)
Eosinophils Relative: 0 %
HEMATOCRIT: 25.5 % — AB (ref 39.0–52.0)
HEMOGLOBIN: 7.8 g/dL — AB (ref 13.0–17.0)
Immature Granulocytes: 4 %
LYMPHS PCT: 6 %
Lymphs Abs: 1.1 10*3/uL (ref 0.7–4.0)
MCH: 33.5 pg (ref 26.0–34.0)
MCHC: 30.6 g/dL (ref 30.0–36.0)
MCV: 109.4 fL — ABNORMAL HIGH (ref 80.0–100.0)
Monocytes Absolute: 1.3 10*3/uL — ABNORMAL HIGH (ref 0.1–1.0)
Monocytes Relative: 7 %
Neutro Abs: 16 10*3/uL — ABNORMAL HIGH (ref 1.7–7.7)
Neutrophils Relative %: 83 %
Platelets: 73 10*3/uL — ABNORMAL LOW (ref 150–400)
RBC: 2.33 MIL/uL — ABNORMAL LOW (ref 4.22–5.81)
RDW: 20.6 % — ABNORMAL HIGH (ref 11.5–15.5)
WBC: 19.3 10*3/uL — ABNORMAL HIGH (ref 4.0–10.5)
nRBC: 0.2 % (ref 0.0–0.2)

## 2018-07-08 LAB — TROPONIN I: Troponin I: <0.03 ng/mL (ref <0.03)

## 2018-07-08 LAB — COMPREHENSIVE METABOLIC PANEL
ALT: 22 U/L (ref 0–44)
AST: 24 U/L (ref 15–41)
Albumin: 3.6 g/dL (ref 3.5–5.0)
Alkaline Phosphatase: 115 U/L (ref 38–126)
Anion gap: 10 (ref 5–15)
BUN: 14 mg/dL (ref 8–23)
CO2: 23 mmol/L (ref 22–32)
Calcium: 8.5 mg/dL — ABNORMAL LOW (ref 8.9–10.3)
Chloride: 105 mmol/L (ref 98–111)
Creatinine, Ser: 0.89 mg/dL (ref 0.61–1.24)
GFR calc Af Amer: >60 mL/min (ref >60)
GFR calc non Af Amer: >60 mL/min (ref >60)
Glucose, Bld: 207 mg/dL — ABNORMAL HIGH (ref 70–99)
Potassium: 3.5 mmol/L (ref 3.5–5.1)
Sodium: 138 mmol/L (ref 135–145)
Total Bilirubin: 0.6 mg/dL (ref 0.3–1.2)
Total Protein: 6.6 g/dL (ref 6.5–8.1)

## 2018-07-08 LAB — HEPARIN LEVEL (UNFRACTIONATED): Heparin Unfractionated: 0.2 IU/mL — ABNORMAL LOW (ref 0.30–0.70)

## 2018-07-08 LAB — CBG MONITORING, ED
Glucose-Capillary: 191 mg/dL — ABNORMAL HIGH (ref 70–99)
Glucose-Capillary: 182 mg/dL — ABNORMAL HIGH (ref 70–99)

## 2018-07-08 LAB — GLUCOSE, CAPILLARY
Glucose-Capillary: 192 mg/dL — ABNORMAL HIGH (ref 70–99)
Glucose-Capillary: 172 mg/dL — ABNORMAL HIGH (ref 70–99)
Glucose-Capillary: 113 mg/dL — ABNORMAL HIGH (ref 70–99)

## 2018-07-08 LAB — BRAIN NATRIURETIC PEPTIDE: B Natriuretic Peptide: 649 pg/mL — ABNORMAL HIGH (ref 0.0–100.0)

## 2018-07-08 LAB — APTT: aPTT: 30 seconds (ref 24–36)

## 2018-07-08 MED ORDER — PANTOPRAZOLE SODIUM 40 MG PO TBEC
40.0000 mg | DELAYED_RELEASE_TABLET | Freq: Every day | ORAL | Status: DC
  Administered 2018-07-08 – 2018-07-11 (×4): 40 mg via ORAL
  Filled 2018-07-08 (×4): qty 1

## 2018-07-08 MED ORDER — MIDODRINE HCL 5 MG PO TABS: 5.0000 mg | ORAL_TABLET | Freq: Once | ORAL | Status: DC

## 2018-07-08 MED ORDER — ONDANSETRON HCL 4 MG/2ML IJ SOLN: 4.0000 mg | Freq: Four times a day (QID) | INTRAMUSCULAR | Status: DC | PRN

## 2018-07-08 MED ORDER — DILTIAZEM LOAD VIA INFUSION
10.0000 mg | Freq: Once | INTRAVENOUS | Status: AC
  Administered 2018-07-08: 10 mg via INTRAVENOUS
  Filled 2018-07-08: qty 10

## 2018-07-08 MED ORDER — ASPIRIN EC 81 MG PO TBEC
81.0000 mg | DELAYED_RELEASE_TABLET | Freq: Every day | ORAL | Status: DC
  Administered 2018-07-08 – 2018-07-11 (×4): 81 mg via ORAL
  Filled 2018-07-08 (×4): qty 1

## 2018-07-08 MED ORDER — METOPROLOL TARTRATE 25 MG PO TABS
25.0000 mg | ORAL_TABLET | Freq: Two times a day (BID) | ORAL | Status: DC
  Administered 2018-07-09 – 2018-07-11 (×5): 25 mg via ORAL
  Filled 2018-07-08 (×5): qty 1

## 2018-07-08 MED ORDER — HEPARIN BOLUS VIA INFUSION
4000.0000 [IU] | Freq: Once | INTRAVENOUS | Status: AC
  Administered 2018-07-08: 4000 [IU] via INTRAVENOUS

## 2018-07-08 MED ORDER — POLYETHYLENE GLYCOL 3350 17 G PO PACK
17.0000 g | PACK | Freq: Two times a day (BID) | ORAL | Status: DC
  Administered 2018-07-08 – 2018-07-10 (×2): 17 g via ORAL
  Filled 2018-07-08 (×5): qty 1

## 2018-07-08 MED ORDER — ALBUTEROL SULFATE (2.5 MG/3ML) 0.083% IN NEBU
5.0000 mg | INHALATION_SOLUTION | Freq: Once | RESPIRATORY_TRACT | Status: AC
  Administered 2018-07-08: 5 mg via RESPIRATORY_TRACT
  Filled 2018-07-08: qty 6

## 2018-07-08 MED ORDER — OMEGA-3-ACID ETHYL ESTERS 1 G PO CAPS
2.0000 g | ORAL_CAPSULE | Freq: Every day | ORAL | Status: DC
  Administered 2018-07-08 – 2018-07-11 (×4): 2 g via ORAL
  Filled 2018-07-08 (×4): qty 2

## 2018-07-08 MED ORDER — HEPARIN (PORCINE) 25000 UT/250ML-% IV SOLN
1100.0000 [IU]/h | INTRAVENOUS | Status: DC
  Administered 2018-07-08: 950 [IU]/h via INTRAVENOUS
  Administered 2018-07-09 – 2018-07-10 (×2): 1100 [IU]/h via INTRAVENOUS
  Filled 2018-07-08 (×3): qty 250

## 2018-07-08 MED ORDER — OXYBUTYNIN CHLORIDE 5 MG PO TABS
5.0000 mg | ORAL_TABLET | Freq: Every day | ORAL | Status: DC
  Administered 2018-07-08 – 2018-07-11 (×4): 5 mg via ORAL
  Filled 2018-07-08 (×4): qty 1

## 2018-07-08 MED ORDER — DILTIAZEM HCL-DEXTROSE 100-5 MG/100ML-% IV SOLN (PREMIX)
5.0000 mg/h | INTRAVENOUS | Status: DC
  Administered 2018-07-08: 5 mg/h via INTRAVENOUS
  Filled 2018-07-08 (×2): qty 100

## 2018-07-08 MED ORDER — INSULIN ASPART 100 UNIT/ML ~~LOC~~ SOLN
0.0000 [IU] | Freq: Three times a day (TID) | SUBCUTANEOUS | Status: DC
  Administered 2018-07-08 – 2018-07-09 (×4): 2 [IU] via SUBCUTANEOUS
  Administered 2018-07-10 – 2018-07-11 (×2): 3 [IU] via SUBCUTANEOUS

## 2018-07-08 MED ORDER — DOCUSATE SODIUM 100 MG PO CAPS
100.0000 mg | ORAL_CAPSULE | Freq: Two times a day (BID) | ORAL | Status: DC
  Administered 2018-07-08 – 2018-07-10 (×3): 100 mg via ORAL
  Filled 2018-07-08 (×5): qty 1

## 2018-07-08 MED ORDER — SODIUM CHLORIDE 0.9% FLUSH
3.0000 mL | Freq: Two times a day (BID) | INTRAVENOUS | Status: DC
  Administered 2018-07-08 – 2018-07-11 (×6): 3 mL via INTRAVENOUS

## 2018-07-08 MED ORDER — OMEGA-3 FATTY ACIDS 1000 MG PO CAPS: 2.0000 g | ORAL_CAPSULE | Freq: Every day | ORAL | Status: DC

## 2018-07-08 MED ORDER — METOPROLOL TARTRATE 50 MG PO TABS
50.0000 mg | ORAL_TABLET | Freq: Two times a day (BID) | ORAL | Status: DC
  Administered 2018-07-08: 50 mg via ORAL
  Filled 2018-07-08: qty 1

## 2018-07-08 MED ORDER — INSULIN ASPART 100 UNIT/ML ~~LOC~~ SOLN: 0.0000 [IU] | Freq: Every day | SUBCUTANEOUS | Status: DC

## 2018-07-08 MED ORDER — SENNOSIDES-DOCUSATE SODIUM 8.6-50 MG PO TABS
1.0000 | ORAL_TABLET | Freq: Two times a day (BID) | ORAL | Status: DC
  Administered 2018-07-08 – 2018-07-10 (×2): 1 via ORAL
  Filled 2018-07-08 (×5): qty 1

## 2018-07-08 MED ORDER — ISOSORBIDE MONONITRATE ER 30 MG PO TB24
30.0000 mg | ORAL_TABLET | Freq: Every day | ORAL | Status: DC
  Administered 2018-07-08: 30 mg via ORAL
  Filled 2018-07-08: qty 1

## 2018-07-08 MED ORDER — TRAMADOL HCL 50 MG PO TABS: 50.0000 mg | ORAL_TABLET | Freq: Four times a day (QID) | ORAL | Status: DC | PRN

## 2018-07-08 MED ORDER — ATORVASTATIN CALCIUM 10 MG PO TABS
20.0000 mg | ORAL_TABLET | Freq: Every day | ORAL | Status: DC
  Administered 2018-07-08 – 2018-07-10 (×3): 20 mg via ORAL
  Filled 2018-07-08 (×3): qty 2

## 2018-07-08 MED ORDER — FUROSEMIDE 10 MG/ML IJ SOLN
40.0000 mg | Freq: Once | INTRAMUSCULAR | Status: AC
  Administered 2018-07-08: 40 mg via INTRAVENOUS
  Filled 2018-07-08: qty 4

## 2018-07-08 MED ORDER — SODIUM CHLORIDE 0.9 % IV SOLN: 250.0000 mL | INTRAVENOUS | Status: DC | PRN

## 2018-07-08 MED ORDER — ACETAMINOPHEN 325 MG PO TABS: 650.0000 mg | ORAL_TABLET | ORAL | Status: DC | PRN

## 2018-07-08 MED ORDER — LOSARTAN POTASSIUM 25 MG PO TABS
25.0000 mg | ORAL_TABLET | Freq: Every day | ORAL | Status: DC
  Administered 2018-07-08: 25 mg via ORAL
  Filled 2018-07-08: qty 1

## 2018-07-08 MED ORDER — SODIUM CHLORIDE 0.9% FLUSH: 3.0000 mL | INTRAVENOUS | Status: DC | PRN

## 2018-07-08 MED ORDER — FUROSEMIDE 10 MG/ML IJ SOLN
40.0000 mg | Freq: Every day | INTRAMUSCULAR | Status: DC
  Administered 2018-07-09: 40 mg via INTRAVENOUS
  Filled 2018-07-08: qty 4

## 2018-07-08 NOTE — Care Management Note (Signed)
Case Management Note  Patient Details  Name: Jemario Poitras MRN: [id number] Date of Birth: 03/02/1941  Subjective/Objective:                    Action/Plan:  Spoke w patient at bedside. He states that he lives at home alone. He recentlyhad his son from [place] staying with him for the last 3 months, but his son had to go back home last week. He states that he is progressively getting more dyspnic. He currently does not have [place], nor does he want it, he also does not have home oxygen and may need it prior to DC. He states that his daughter and granddaughter (at bedside) help him with transport to chemo and other appointments as well as food prep. He states that the cooking and cleaning he does has become increasingly more difficult due to SOB, but declines additional help through home health.  CM will continue to follow.   Expected Discharge Date:                  Expected Discharge Plan:     In-House Referral:     Discharge planning Services  CM Consult  Post Acute Care Choice:    Choice offered to:     DME Arranged:    DME Agency:     HH Arranged:  Patient Refused [place] Agency:     Status of Service:  In process, will continue to follow  If discussed at Long Length of Stay Meetings, dates discussed:    Additional Comments:  Carles Collet, RN 07/08/2018, 2:24 PM

## 2018-07-08 NOTE — ED Provider Notes (Signed)
[facility] EMERGENCY DEPARTMENT Provider Note   CSN: [id number] Arrival date & time: 07/08/18  8469  Time seen  5:18 AM   History   Chief Complaint Chief Complaint  Patient presents with  . Shortness of Breath    HPI Arthur [name] is a 77 y.o. male.  HPI patient states this morning about 2 AM he got acutely short of breath.  He states he can feel that his heart is beating differently.  He denies chest pain.  He states he had something similar when he had a MI before but states he is never been told that he has atrial fibrillation or a irregular heartbeat that he can recall.  He denies nausea, vomiting, diaphoresis, coughing, or having to have oxygen at home.  Patient was diagnosed with small cell carcinoma of the lung when he had a right upper lung mass with mets to the retroperitoneal lymph nodes, left adrenal gland, and involvement of the right scapula in August 2019.  He has been on chemotherapy with carboplatinum, etoposide, and Tecentriq and has had 4 cycles.  He was last seen by Dr. Earlie Server, his oncologist on December 2.  At that time he had just had  CT scans done again and the mediastinal and right hilar lymphadenopathy was slightly smaller, but the right pleural effusion was slightly larger..  They decided to do 2 additional cycles of the Tecentriq got the fifth treatment on December 2.  He has a known right pleural effusion.  He also reports that he has been having swelling of his legs and he is asked to be given a fluid pill but they have it.  PCP Dione Housekeeper, MD Cardiology Dr Gwenlyn Found Oncology Dr Julien Nordmann  Past Medical History:  Diagnosis Date  . Arthritis   . Ascending aortic aneurysm (HCC)    PER CT CHEST 03-08-18 IN EPIC , 4.0CM  . Chronic kidney disease   . Claudication ([place])   . Coronary artery disease 1997   CFX PCI, '97. Mod residual RI and RCA dis.  . Diabetes mellitus ([place])    IDDM  . Dyslipidemia   . Dyspnea   . GERD (gastroesophageal reflux disease)     . Hard of hearing   . History of bronchitis   . History of chemotherapy   . History of kidney stones 1958  . HTN (hypertension)   . Lacunar stroke ([place])   . Metastatic lung cancer (metastasis from lung to other site), unspecified laterality ([place])    DIAGNOSED 02-2018, MANAGED BY DR [place]  . NSTEMI (non-ST elevated myocardial infarction) ([place])   . PAD (peripheral artery disease) ([place])   . Pneumonia   . Tobacco abuse   . Urinary incontinence   . Wears glasses     Patient Active Problem List   Diagnosis Date Noted  . Protein calorie malnutrition ([place]) 04/18/2018  . Encounter for antineoplastic chemotherapy 03/20/2018  . Encounter for antineoplastic immunotherapy 03/20/2018  . Goals of care, counseling/discussion 03/20/2018  . Small cell lung cancer, right ([place]) 03/18/2018  . Lung mass   . NSTEMI (non-ST elevated myocardial infarction) ([place]) 03/08/2018  . Kidney stone 03/08/2018  . Mesenteric mass 03/08/2018  . Renal insufficiency   . Lacunar stroke ([place]) 01/27/2017  . Left leg weakness 01/27/2017  . Tobacco abuse 01/27/2017  . Atherosclerosis of native arteries of extremities with intermittent claudication, right leg ([place]) 05/13/2016  . PAD (peripheral artery disease) ([place]) 02/19/2016  . CAD S/P percutaneous coronary angioplasty 09/20/2013  .  Essential hypertension 09/20/2013  . Hyperlipidemia 09/20/2013  . Insulin dependent diabetes mellitus ([place]) 09/20/2013  . Claudication ([place]) 09/20/2013    Past Surgical History:  Procedure Laterality Date  . APPENDECTOMY     "busted on me"  . COLONOSCOPY    . CORONARY ANGIOPLASTY  1997   CFX  . CYSTOSCOPY WITH RETROGRADE PYELOGRAM, URETEROSCOPY AND STENT PLACEMENT Right 04/13/2018   Procedure: CYSTOSCOPY WITH RETROGRADE PYELOGRAM, URETEROSCOPY AND STENT PLACEMENT;  Surgeon: Alexis Frock, MD;  Location: [facility];  Service: Urology;  Laterality: Right;  1 HR  . ENDARTERECTOMY FEMORAL Right 05/13/2016    iliofemoral endarterectomy with bovine pericardial patch angioplasty  . ENDARTERECTOMY FEMORAL Right 05/13/2016   Procedure: RIGHT ENDARTERECTOMY FEMORAL;  Surgeon: Serafina Mitchell, MD;  Location: [facility];  Service: Vascular;  Laterality: Right;  . HOLMIUM LASER APPLICATION Right 2/77/8242   Procedure: HOLMIUM LASER APPLICATION;  Surgeon: Alexis Frock, MD;  Location: [facility];  Service: Urology;  Laterality: Right;  . PATCH ANGIOPLASTY Right 05/13/2016   Procedure: [place];  Surgeon: Serafina Mitchell, MD;  Location: [place];  Service: Vascular;  Laterality: Right;  . PERIPHERAL VASCULAR CATHETERIZATION N/A 02/20/2016   Procedure: Lower Extremity Angiography;  Surgeon: Lorretta Harp, MD;  Location: [facility];  Service: Cardiovascular;  Laterality: N/A;        Home Medications    Prior to Admission medications   Medication Sig Start Date End Date Taking? Authorizing Provider  amLODipine (NORVASC) 5 MG tablet Take 1 tablet (5 mg total) by mouth daily. 03/30/18 06/28/18  Erlene Quan, PA-C  aspirin EC 81 MG EC tablet Take 1 tablet (81 mg total) by mouth daily. 03/17/18   Elgergawy, Silver Huguenin, MD  atorvastatin (LIPITOR) 20 MG tablet TAKE 1 TABLET (20 MG TOTAL) BY MOUTH DAILY. Patient taking differently: Take 20 mg by mouth daily at 6 PM.  11/30/17   Lorretta Harp, MD  clopidogrel (PLAVIX) 75 MG tablet Take 1 tablet (75 mg total) by mouth daily. 03/17/18   Elgergawy, Silver Huguenin, MD  docusate sodium (COLACE) 100 MG capsule Take 100 mg by mouth 2 (two) times daily.    [provider]  doxazosin (CARDURA) 1 MG tablet Take 3 mg by mouth at bedtime. Pt takes 3 at bedtime     [provider]  fish oil-omega-3 fatty acids 1000 MG capsule Take 2 g by mouth daily.     [provider]  glucose blood (ONETOUCH VERIO) test strip CHECK BLOOD SUGARS 3 DAILY:DX 250.40 One touch verio 05/04/18   [provider]  insulin lispro  (HUMALOG) 100 UNIT/ML injection Inject 0.35 mLs (35 Units total) into the skin 2 (two) times daily. Patient taking differently: Inject 25-40 Units into the skin 2 (two) times daily at 10 AM and 5 PM.  03/16/18   Elgergawy, Silver Huguenin, MD  isosorbide mononitrate (IMDUR) 30 MG 24 hr tablet Take 1 tablet (30 mg total) by mouth daily. 04/05/18   Erlene Quan, PA-C  meloxicam (MOBIC) 7.5 MG tablet Take 7.5 mg by mouth daily.    [provider]  metoprolol tartrate (LOPRESSOR) 50 MG tablet Take 1 tablet (50 mg total) by mouth 2 (two) times daily. 06/27/18   Lorretta Harp, MD  Misc. Devices MISC 1 wheelchair for mobility. 04/27/18   [provider]  nitroGLYCERIN (NITROSTAT) 0.4 MG SL tablet Place 1 tablet (0.4 mg total) under the tongue every 5 (five) minutes x  3 doses as needed for chest pain. Patient not taking: Reported on 06/20/2018 03/30/18   Erlene Quan, PA-C  Nutritional Supplements (FEEDING SUPPLEMENT, GLUCERNA 1.2 CAL,) LIQD Take as directed by physician 05/17/18   [provider]  oxybutynin (DITROPAN) 5 MG tablet Take 5 mg by mouth daily.     [provider]  pantoprazole (PROTONIX) 40 MG tablet Take 1 tablet (40 mg total) by mouth daily. 03/16/18   Elgergawy, Silver Huguenin, MD  polyethylene glycol (MIRALAX / GLYCOLAX) packet Take 17 g by mouth 2 (two) times daily.    [provider]  prochlorperazine (COMPAZINE) 10 MG tablet Take 1 tablet (10 mg total) by mouth every 6 (six) hours as needed for nausea or vomiting. 03/28/18   Curt Bears, MD  senna-docusate (SENOKOT-S) 8.6-50 MG tablet Take 1 tablet by mouth 2 (two) times daily. While taking strong pain meds to prevent constipation 04/13/18   Alexis Frock, MD  Sennosides (EX-LAX PO) Take 2 tablets by mouth daily as needed (constipation).    [provider]  traMADol (ULTRAM) 50 MG tablet Take 1-2 tablets (50-100 mg total) by mouth every 6 (six) hours as needed for moderate pain or severe pain.  Post-operatively 04/13/18   Alexis Frock, MD    Family History Family History  Problem Relation Age of Onset  . Diabetes Sister   . Arthritis Mother   . Heart disease Father     Social History Social History   Tobacco Use  . Smoking status: Former Smoker    Packs/day: 0.25    Years: 65.00    Pack years: 16.25    Types: Cigarettes  . Smokeless tobacco: Never Used  . Tobacco comment: 05/13/2016 "stopped smoking ~ 1 month ago"  Substance Use Topics  . Alcohol use: No  . Drug use: No     Allergies   Codeine   Review of Systems Review of Systems  All other systems reviewed and are negative.    Physical Exam Updated Vital Signs BP (!) 144/84   Pulse 75   Temp 97.6 F (36.4 C)   Resp 13   Ht 5\' 9"  (1.753 m)   Wt 78.2 kg   SpO2 100%   BMI 25.47 kg/m   Vital signs normal    Physical Exam Vitals signs and nursing note reviewed.  Constitutional:      General: He is not in acute distress.    Appearance: Normal appearance. He is well-developed. He is not ill-appearing or toxic-appearing.  HENT:     Head: Normocephalic and atraumatic.     Right Ear: External ear normal.     Left Ear: External ear normal.     Nose: Nose normal. No mucosal edema or rhinorrhea.     Mouth/Throat:     Dentition: No dental abscesses.     Pharynx: No uvula swelling.  Eyes:     Conjunctiva/sclera: Conjunctivae normal.     Pupils: Pupils are equal, round, and reactive to light.  Neck:     Musculoskeletal: Full passive range of motion without pain, normal range of motion and neck supple.  Cardiovascular:     Rate and Rhythm: Tachycardia present. Rhythm regularly irregular.     Heart sounds: Normal heart sounds. No murmur. No friction rub. No gallop.   Pulmonary:     Effort: Pulmonary effort is normal. No respiratory distress.     Breath sounds: Normal breath sounds. No wheezing, rhonchi or rales.     Comments: Patient has diminished  breath sounds in the right posterior base and  some rales at the left base. Chest:     Chest wall: No tenderness or crepitus.  Abdominal:     General: Bowel sounds are normal. There is no distension.     Palpations: Abdomen is soft.     Tenderness: There is no abdominal tenderness. There is no guarding or rebound.  Musculoskeletal: Normal range of motion.        General: No tenderness.     Comments: Moves all extremities well.   Skin:    General: Skin is warm and dry.     Coloration: Skin is not pale.     Findings: No erythema or rash.  Neurological:     Mental Status: He is alert and oriented to person, place, and time.     Cranial Nerves: No cranial nerve deficit.  Psychiatric:        Mood and Affect: Mood is not anxious.        Speech: Speech normal.        Behavior: Behavior normal.      ED Treatments / Results  Labs (all labs ordered are listed, but only abnormal results are displayed) Results for orders placed or performed during the hospital encounter of 07/08/18  Comprehensive metabolic panel  Result Value Ref Range   Sodium 138 135 - 145 mmol/L   Potassium 3.5 3.5 - 5.1 mmol/L   Chloride 105 98 - 111 mmol/L   CO2 23 22 - 32 mmol/L   Glucose, Bld 207 (H) 70 - 99 mg/dL   BUN 14 8 - 23 mg/dL   Creatinine, Ser 0.89 0.61 - 1.24 mg/dL   Calcium 8.5 (L) 8.9 - 10.3 mg/dL   Total Protein 6.6 6.5 - 8.1 g/dL   Albumin 3.6 3.5 - 5.0 g/dL   AST 24 15 - 41 U/L   ALT 22 0 - 44 U/L   Alkaline Phosphatase 115 38 - 126 U/L   Total Bilirubin 0.6 0.3 - 1.2 mg/dL   GFR calc non Af Amer >60 >60 mL/min   GFR calc Af Amer >60 >60 mL/min   Anion gap 10 5 - 15  Troponin I - Once  Result Value Ref Range   Troponin I <0.03 <0.03 ng/mL  CBC with Differential  Result Value Ref Range   WBC 19.3 (H) 4.0 - 10.5 K/uL   RBC 2.33 (L) 4.22 - 5.81 MIL/uL   Hemoglobin 7.8 (L) 13.0 - 17.0 g/dL   HCT 25.5 (L) 39.0 - 52.0 %   MCV 109.4 (H) 80.0 - 100.0 fL   MCH 33.5 26.0 - 34.0 pg   MCHC 30.6 30.0 - 36.0 g/dL   RDW 20.6 (H) 11.5 - 15.5  %   Platelets 73 (L) 150 - 400 K/uL   nRBC 0.2 0.0 - 0.2 %   Neutrophils Relative % 83 %   Neutro Abs 16.0 (H) 1.7 - 7.7 K/uL   Lymphocytes Relative 6 %   Lymphs Abs 1.1 0.7 - 4.0 K/uL   Monocytes Relative 7 %   Monocytes Absolute 1.3 (H) 0.1 - 1.0 K/uL   Eosinophils Relative 0 %   Eosinophils Absolute 0.0 0.0 - 0.5 K/uL   Basophils Relative 0 %   Basophils Absolute 0.1 0.0 - 0.1 K/uL   Immature Granulocytes 4 %   Abs Immature Granulocytes 0.81 (H) 0.00 - 0.07 K/uL  Brain natriuretic peptide  Result Value Ref Range   B Natriuretic Peptide 649.0 (H) 0.0 -  100.0 pg/mL   Laboratory interpretation all normal except leukocytosis, anemia, thrombocytopenia, mild elevation of BNP, hyperglycemia    EKG EKG Interpretation  Date/Time:  Friday July 08 2018 04:55:22 EST Ventricular Rate:  125 PR Interval:    QRS Duration: 102 QT Interval:  316 QTC Calculation: 456 R Axis:   132 Text Interpretation:  Atrial fibrillation Right axis deviation Repol abnrm suggests ischemia, lateral leads Since last tracing 08 Mar 2018 Atrial fibrillation has replaced Normal sinus rhythm Confirmed by Rolland Porter [phone]) on 07/08/2018 5:16:23 AM   Radiology Dg Chest Port 1 View  Result Date: 07/08/2018 CLINICAL DATA:  Shortness of breath EXAM: PORTABLE CHEST 1 VIEW COMPARISON:  Chest CT 06/14/2018 FINDINGS: Medium-sized right pleural effusion with associated atelectasis. Aeration of the right lung is slightly worse than on the CT of 06/14/2018. There is moderate cardiomegaly. Left lung is well aerated without focal consolidation or pulmonary edema. IMPRESSION: Medium-sized right pleural effusion with associated atelectasis. Electronically Signed   By: Ulyses Jarred M.D.   On: 07/08/2018 06:00    Procedures .Critical Care Performed by: Rolland Porter, MD Authorized by: Rolland Porter, MD   Critical care provider statement:    Critical care time (minutes):  38   Critical care was necessary to treat or prevent  imminent or life-threatening deterioration of the following conditions:  Cardiac failure   Critical care was time spent personally by me on the following activities:  Discussions with consultants, examination of patient, obtaining history from patient or surrogate, ordering and review of laboratory studies, ordering and review of radiographic studies, pulse oximetry, re-evaluation of patient's condition and review of old charts   (including critical care time)  Review of Dr. Naida Sleight note, his cardiologist shows patient had a non-STEMI on August 20.  He documents patient did have a posterior circumflex stent done in November 1997.  Patient has peripheral artery disease and had a angiogram done in August 2017 revealing a 99% calcified exophytic right common femoral artery stenosis and a 75% segmental calcified mid right superficial femoral artery stenosis with two-vessel runoff.  He underwent right iliofemoral endarterectomy with bovine pericardial patch angioplasty by Dr. Trula Slade on October 2017.  Patient has had a 2D echo done March 09, 2018 which revealed ejection fraction of 35%.  He had diffuse hypokinesis with akinesis of the inferior and inferoseptal myocardium.  He had a grade 1 diastolic dysfunction.  He had a small pericardial effusion circumferential to the heart at that time without evidence of tamponade.  Medications Ordered in ED Medications  diltiazem (CARDIZEM) 1 mg/mL load via infusion 10 mg (10 mg Intravenous Bolus from Bag 07/08/18 0540)    And  diltiazem (CARDIZEM) 100 mg in dextrose 5% 120mL (1 mg/mL) infusion (10 mg/hr Intravenous Rate/Dose Change 07/08/18 0624)  furosemide (LASIX) injection 40 mg (has no administration in time range)  albuterol (PROVENTIL) (2.5 MG/3ML) 0.083% nebulizer solution 5 mg (5 mg Nebulization Given 07/08/18 0502)     Initial Impression / Assessment and Plan / ED Course  I have reviewed the triage vital signs and the nursing notes.  Pertinent labs &  imaging results that were available during my care of the patient were reviewed by me and considered in my medical decision making (see chart for details).   During my interview patient had a irregularly fast heart rate varying from 120 up to 140.  He has a new atrial fibrillation by patient history and by his prior EKGs.  He was started on a Cardizem  bolus and drip.  Evidently EMS gave him an albuterol treatment and he was given an albuterol treatment when he arrived in the ED prior to my seeing him.  Recheck at 7:20 AM patient is noted to still be in atrial fibrillation however his heart rate now is around 100.  Patient states his breathing is much better now.  We looked at his x-ray and his right pleural effusion is much larger than on his prior CT scan in November.  He also has marked increased pulmonary vascularity.  He was given Lasix 40 mg IV.  I talked to the patient that I was going to speak to the cardiologist about admission, since there is no cardiologist here over the weekend starting tomorrow they may want him to be admitted down at [facility] or [facility].  He is agreeable.  8:27 AM Dr. Oval Linsey, cardiology wants medicine to admit and consult cardiology.  08:35 AM Dr Lacinda Axon will talk to the hospitalist  Final Clinical Impressions(s) / ED Diagnoses   Final diagnoses:  Atrial fibrillation, new onset ([place])  Atrial fibrillation with rapid ventricular response ([place])  Shortness of breath  Pleural effusion, right    Plan admission  Rolland Porter, MD, Barbette Or, MD 07/11/18 2259

## 2018-07-08 NOTE — H&P (Addendum)
History and Physical  Arthur [name] WVP:[id number] DOB: 31-Dec-1940 DOA: 07/08/2018   PCP: Dione Housekeeper, MD   Patient coming from: Home  Chief Complaint: Dyspnea and palpitations  HPI:  Arthur [name] is a 77 y.o. male with medical history of extensive stage small cell carcinoma, diabetes mellitus, coronary disease, peripheral vascular disease, hypertension, hyperlipidemia presenting with 67-month history of shortness of breath that significantly worsened when he woke up in the early morning of 07/08/2018.  At that time, the patient also felt palpitations.  Upon further questioning, the patient states that he has been experiencing worsening dyspnea on exertion for the past several weeks.  In addition, he has noticed increasing lower extremity edema and orthopnea type symptoms.  He denies any fevers, chills, chest pain, nausea, vomiting, diarrhea, dysuria, hematuria, abdominal pain.  He denies any headache, neck pain, visual disturbance, focal extremity weakness. Regarding his small cell carcinoma, the patient's last chemotherapy was on 06/20/2018 when he decided  to do 2 additional cycles of the Tecentriq.  He follows with Dr. Earlie Server.  In the emergency department, the patient was afebrile and hemodynamically stable.  He was initially placed on nonrebreather, but has been weaned to 2 L nasal cannula after diuresing with furosemide IV x1.  The patient was noted to have atrial fibrillation with RVR with heart rate in the 130s.  The patient was started on diltiazem drip with improvement of his heart rate in the low 100s.  BMP was essentially unremarkable.  WBC was 19.3 with hemoglobin 7.8 and platelets 73,000 chest x-ray showed moderate right pleural effusion that increased since his last imaging.  BNP was 649.  EKG showed atrial fibrillation with RVR and nonspecific T wave changes.  EDP spoke with cardiology, Dr. Oval Linsey.  It was recommended the patient be transferred to [facility] for further  evaluation and treatment.  Assessment/Plan: Acute on chronic systolic and diastolic CHF -5/46/2703 echo EF 35-40%, diffuse HK, grade 1 DD -Continue furosemide 40 mg IV daily -Daily weights -Accurate I's and O's  Acute respiratory failure with hypoxia -Secondary to pulmonary edema and pleural effusion -Initially on nonrebreather -Currently stable on 2 L without any increased work of breathing -Wean oxygen to room air for saturation greater 92%  New onset atrial fibrillation with RVR -Continue diltiazem drip for now -Given the patient's suppressed EF, plan to wean off diltiazem and transition to beta-blocker -Continue home dose metoprolol tartrate -CHADS-VASc = 6 -Start IV heparin pending cardiology evaluation  Thrombocytopenia -Likely due to most recent chemotherapy -Monitor closely for signs of bleeding and worsening, cytopenia with heparin  Diabetes mellitus type 2 -NovoLog sliding scale -03/09/2018 hemoglobin A1c 7.1  Essential hypertension -Continue metoprolol tartrate -Holding amlodipine as the patient is currently on diltiazem -Holding Cardura to allow BP margin for diuresis  Extensive stage small cell carcinoma  -with mets to the lymph nodes and scapula -Follows Dr. Earlie Server -Last treatment 12-19  Right pleural effusion -Suspect this is a malignant effusion with possible acute worsening due to decompensated CHF -May need thoracocentesis if patient does not continue to improve  Leukocytosis -Likely stress demargination -UA and urine culture -Monitor clinically off antibiotics -A.m. CBC  Hyperlipidemia -Continue statin  Coronary artery disease -No chest pain presently -Holding Plavix as the patient will be started on IV heparin -Continue Imdur -EKG without concerning ST-T wave changes         Past Medical History:  Diagnosis Date  . Arthritis   . Ascending aortic aneurysm (  [place])    PER CT CHEST 03-08-18 IN EPIC , 4.0CM  . Chronic kidney disease    . Claudication ([place])   . Coronary artery disease 1997   CFX PCI, '97. Mod residual RI and RCA dis.  . Diabetes mellitus ([place])    IDDM  . Dyslipidemia   . Dyspnea   . GERD (gastroesophageal reflux disease)   . Hard of hearing   . History of bronchitis   . History of chemotherapy   . History of kidney stones 1958  . HTN (hypertension)   . Lacunar stroke ([place])   . Metastatic lung cancer (metastasis from lung to other site), unspecified laterality ([place])    DIAGNOSED 02-2018, MANAGED BY DR Callaghan  . NSTEMI (non-ST elevated myocardial infarction) ([place])   . PAD (peripheral artery disease) ([place])   . Pneumonia   . Tobacco abuse   . Urinary incontinence   . Wears glasses    Past Surgical History:  Procedure Laterality Date  . APPENDECTOMY     "busted on me"  . COLONOSCOPY    . CORONARY ANGIOPLASTY  1997   CFX  . CYSTOSCOPY WITH RETROGRADE PYELOGRAM, URETEROSCOPY AND STENT PLACEMENT Right 04/13/2018   Procedure: CYSTOSCOPY WITH RETROGRADE PYELOGRAM, URETEROSCOPY AND STENT PLACEMENT;  Surgeon: Alexis Frock, MD;  Location: [facility];  Service: Urology;  Laterality: Right;  1 HR  . ENDARTERECTOMY FEMORAL Right 05/13/2016   iliofemoral endarterectomy with bovine pericardial patch angioplasty  . ENDARTERECTOMY FEMORAL Right 05/13/2016   Procedure: RIGHT ENDARTERECTOMY FEMORAL;  Surgeon: Serafina Mitchell, MD;  Location: [place];  Service: Vascular;  Laterality: Right;  . HOLMIUM LASER APPLICATION Right 1/61/0960   Procedure: HOLMIUM LASER APPLICATION;  Surgeon: Alexis Frock, MD;  Location: [facility];  Service: Urology;  Laterality: Right;  . PATCH ANGIOPLASTY Right 05/13/2016   Procedure: [place];  Surgeon: Serafina Mitchell, MD;  Location: [place];  Service: Vascular;  Laterality: Right;  . PERIPHERAL VASCULAR CATHETERIZATION N/A 02/20/2016   Procedure: Lower Extremity Angiography;  Surgeon: Lorretta Harp, MD;  Location: [facility];  Service: Cardiovascular;  Laterality: N/A;   Social History:  reports that he has quit smoking. His smoking use included cigarettes. He has a 16.25 pack-year smoking history. He has never used smokeless tobacco. He reports that he does not drink alcohol or use drugs.   Family History  Problem Relation Age of Onset  . Diabetes Sister   . Arthritis Mother   . Heart disease Father      Allergies  Allergen Reactions  . Codeine Swelling     Prior to Admission medications   Medication Sig Start Date End Date Taking? Authorizing Provider  amLODipine (NORVASC) 5 MG tablet Take 1 tablet (5 mg total) by mouth daily. 03/30/18 06/28/18  Erlene Quan, PA-C  aspirin EC 81 MG EC tablet Take 1 tablet (81 mg total) by mouth daily. 03/17/18   Elgergawy, Silver Huguenin, MD  atorvastatin (LIPITOR) 20 MG tablet TAKE 1 TABLET (20 MG TOTAL) BY MOUTH DAILY. Patient taking differently: Take 20 mg by mouth daily at 6 PM.  11/30/17   Lorretta Harp, MD  clopidogrel (PLAVIX) 75 MG tablet Take 1 tablet (75 mg total) by mouth daily. 03/17/18   Elgergawy, Silver Huguenin, MD  docusate sodium (COLACE) 100 MG capsule Take 100 mg by mouth 2 (two) times daily.    [provider]  doxazosin (CARDURA) 1 MG tablet Take 3  mg by mouth at bedtime. Pt takes 3 at bedtime     [provider]  fish oil-omega-3 fatty acids 1000 MG capsule Take 2 g by mouth daily.     [provider]  glucose blood (ONETOUCH VERIO) test strip CHECK BLOOD SUGARS 3 DAILY:DX 250.40 One touch verio 05/04/18   [provider]  insulin lispro (HUMALOG) 100 UNIT/ML injection Inject 0.35 mLs (35 Units total) into the skin 2 (two) times daily. Patient taking differently: Inject 25-40 Units into the skin 2 (two) times daily at 10 AM and 5 PM.  03/16/18   Elgergawy, Silver Huguenin, MD  isosorbide mononitrate (IMDUR) 30 MG 24 hr tablet Take 1 tablet (30 mg total) by mouth daily. 04/05/18   Erlene Quan, PA-C  meloxicam (MOBIC) 7.5 MG  tablet Take 7.5 mg by mouth daily.    [provider]  metoprolol tartrate (LOPRESSOR) 50 MG tablet Take 1 tablet (50 mg total) by mouth 2 (two) times daily. 06/27/18   Lorretta Harp, MD  Misc. Devices MISC 1 wheelchair for mobility. 04/27/18   [provider]  nitroGLYCERIN (NITROSTAT) 0.4 MG SL tablet Place 1 tablet (0.4 mg total) under the tongue every 5 (five) minutes x 3 doses as needed for chest pain. Patient not taking: Reported on 06/20/2018 03/30/18   Erlene Quan, PA-C  Nutritional Supplements (FEEDING SUPPLEMENT, GLUCERNA 1.2 CAL,) LIQD Take as directed by physician 05/17/18   [provider]  oxybutynin (DITROPAN) 5 MG tablet Take 5 mg by mouth daily.     [provider]  pantoprazole (PROTONIX) 40 MG tablet Take 1 tablet (40 mg total) by mouth daily. 03/16/18   Elgergawy, Silver Huguenin, MD  polyethylene glycol (MIRALAX / GLYCOLAX) packet Take 17 g by mouth 2 (two) times daily.    [provider]  prochlorperazine (COMPAZINE) 10 MG tablet Take 1 tablet (10 mg total) by mouth every 6 (six) hours as needed for nausea or vomiting. 03/28/18   Curt Bears, MD  senna-docusate (SENOKOT-S) 8.6-50 MG tablet Take 1 tablet by mouth 2 (two) times daily. While taking strong pain meds to prevent constipation 04/13/18   Alexis Frock, MD  Sennosides (EX-LAX PO) Take 2 tablets by mouth daily as needed (constipation).    [provider]  traMADol (ULTRAM) 50 MG tablet Take 1-2 tablets (50-100 mg total) by mouth every 6 (six) hours as needed for moderate pain or severe pain. Post-operatively 04/13/18   Alexis Frock, MD    Review of Systems:  Constitutional:  No weight loss, night sweats, Fevers, chills, fatigue.  Head&Eyes: No headache.  No vision loss.  No eye pain or scotoma ENT:  No Difficulty swallowing,Tooth/dental problems,Sore throat,  No ear ache, post nasal drip,  Cardio-vascular:  No chest pain, dizziness, palpitations  GI:  No   abdominal pain, nausea, vomiting, diarrhea, loss of appetite, hematochezia, melena, heartburn, indigestion, Resp:   No coughing up of blood .No wheezing.No chest wall deformity  Skin:  no rash or lesions.  GU:  no dysuria, change in color of urine, no urgency or frequency. No flank pain.  Musculoskeletal:  No joint pain or swelling. No decreased range of motion. No back pain.  Psych:  No change in mood or affect. No depression or anxiety. Neurologic: No headache, no dysesthesia, no focal weakness, no vision loss. No syncope  Physical Exam: Vitals:   07/08/18 0813 07/08/18 0815 07/08/18 0900 07/08/18 0912  BP:  123/67 133/85   Pulse: 98 100  99 92  Resp: (!) 22 19 20  (!) 22  Temp:      SpO2: 99% 100% 100% 100%  Weight:      Height:       General:  A&O x 3, NAD, nontoxic, pleasant/cooperative Head/Eye: No conjunctival hemorrhage, no icterus, [place]/AT, No nystagmus ENT:  No icterus,  No thrush, good dentition, no pharyngeal exudate Neck:  No masses, no lymphadenpathy, no bruits CV:  IRRR, no rub, no gallop, no S3 Lung: Bilateral crackles.  Diminished breath sounds in the right base.  No wheezing. Abdomen: soft/NT, +BS, nondistended, no peritoneal signs Ext: No cyanosis, No rashes, No petechiae, No lymphangitis, 2+ lower extremity edema Neuro: CNII-XII intact, strength 4/5 in bilateral upper and lower extremities, no dysmetria  Labs on Admission:  Basic Metabolic Panel: Recent Labs  Lab 07/08/18 0614  NA 138  K 3.5  CL 105  CO2 23  GLUCOSE 207*  BUN 14  CREATININE 0.89  CALCIUM 8.5*   Liver Function Tests: Recent Labs  Lab 07/08/18 0614  AST 24  ALT 22  ALKPHOS 115  BILITOT 0.6  PROT 6.6  ALBUMIN 3.6   No results for input(s): LIPASE, AMYLASE in the last 168 hours. No results for input(s): AMMONIA in the last 168 hours. CBC: Recent Labs  Lab 07/08/18 0614  WBC 19.3*  NEUTROABS 16.0*  HGB 7.8*  HCT 25.5*  MCV 109.4*  PLT 73*   Coagulation Profile: No  results for input(s): INR, PROTIME in the last 168 hours. Cardiac Enzymes: Recent Labs  Lab 07/08/18 0614  TROPONINI <0.03   BNP: Invalid input(s): POCBNP CBG: No results for input(s): GLUCAP in the last 168 hours. Urine analysis:    Component Value Date/Time   COLORURINE YELLOW 02/25/2018 [place] 02/25/2018 1157   LABSPEC 1.016 02/25/2018 1157   PHURINE 6.0 02/25/2018 1157   GLUCOSEU 50 (A) 02/25/2018 1157   HGBUR NEGATIVE 02/25/2018 1157   BILIRUBINUR NEGATIVE 02/25/2018 1157   KETONESUR NEGATIVE 02/25/2018 1157   PROTEINUR NEGATIVE 02/25/2018 1157   NITRITE NEGATIVE 02/25/2018 1157   LEUKOCYTESUR NEGATIVE 02/25/2018 1157   Sepsis Labs: @LABRCNTIP (procalcitonin:4,lacticidven:4) )No results found for this or any previous visit (from the past 240 hour(s)).   Radiological Exams on Admission: Dg Chest Port 1 View  Result Date: 07/08/2018 CLINICAL DATA:  Shortness of breath EXAM: PORTABLE CHEST 1 VIEW COMPARISON:  Chest CT 06/14/2018 FINDINGS: Medium-sized right pleural effusion with associated atelectasis. Aeration of the right lung is slightly worse than on the CT of 06/14/2018. There is moderate cardiomegaly. Left lung is well aerated without focal consolidation or pulmonary edema. IMPRESSION: Medium-sized right pleural effusion with associated atelectasis. Electronically Signed   By: Ulyses Jarred M.D.   On: 07/08/2018 06:00    EKG: Independently reviewed.     Time spent:60 minutes Code Status:   FULL Family Communication:  No Family at bedside Disposition Plan: expect 2-3day hospitalization Consults called: Cardiology--Dr. Oval Linsey DVT Prophylaxis: IV Heparin    Orson Eva, DO  [facility] Pager [phone]  If 7PM-7AM, please contact night-coverage [email or web address] Password [facility] 07/08/2018, 9:16 AM

## 2018-07-08 NOTE — ED Notes (Signed)
Removed Non re breather and placed pt on 2 l o2 via , will continue to monitor

## 2018-07-08 NOTE — Progress Notes (Signed)
[place] for Heparin Indication: atrial fibrillation  Allergies  Allergen Reactions  . Codeine Swelling    Patient Measurements: Height: 5\' 9"  (175.3 cm) Weight: 166 lb 7.2 oz (75.5 kg) IBW/kg (Calculated) : 70.7 HEPARIN DW (KG): 75.5  Vital Signs: Temp: 99.1 F (37.3 C) (12/20 1721) Temp Source: Oral (12/20 1721) BP: 111/59 (12/20 1738) Pulse Rate: 74 (12/20 1738)  Labs: Recent Labs    07/08/18 0614 07/08/18 1818  HGB 7.8*  --   HCT 25.5*  --   PLT 73*  --   APTT 30  --   HEPARINUNFRC  --  0.20*  CREATININE 0.89  --   TROPONINI <0.03  --     Estimated Creatinine Clearance: 67.3 mL/min (by C-G formula based on SCr of 0.89 mg/dL).  Assessment: 77 yo male presented to ED with SOB. Pharmacy consulted for heparin dosing for afib.  Currently on chemo for lung cancer. Hgb 7.8 and plt 73K - MD is aware and ok with starting heparin.  Heparin level 0.2 (subtherapeutic) on gtt at 950 units/hr. No issues with line or bleeding reported per RN.  Goal of Therapy:  Heparin level 0.3-0.7 units/ml Monitor platelets by anticoagulation protocol: Yes   Plan:  Increase heparin to 1100 units/hr Will f/u 8hr heparin level Daily heparin level and CBC  Sherlon Handing, PharmD, BCPS Clinical pharmacist  **Pharmacist phone directory can now be found on [email or web address] ([email or web address]).  Listed under [place]. 07/08/2018,7:36 PM

## 2018-07-08 NOTE — Progress Notes (Signed)
ANTICOAGULATION CONSULT NOTE - Initial Consult  Pharmacy Consult for Heparin Indication: atrial fibrillation  Allergies  Allergen Reactions  . Codeine Swelling    Patient Measurements: Height: 5\' 9"  (175.3 cm) Weight: 172 lb 8 oz (78.2 kg) IBW/kg (Calculated) : 70.7 HEPARIN DW (KG): 78.2  Vital Signs: Temp: 97.6 F (36.4 C) (12/20 0458) BP: 124/69 (12/20 0915) Pulse Rate: 88 (12/20 0928)  Labs: Recent Labs    07/08/18 0614  HGB 7.8*  HCT 25.5*  PLT 73*  CREATININE 0.89  TROPONINI <0.03    Estimated Creatinine Clearance: 67.3 mL/min (by C-G formula based on SCr of 0.89 mg/dL).   Medical History: Past Medical History:  Diagnosis Date  . Arthritis   . Ascending aortic aneurysm ([facility])    PER CT CHEST 03-08-18 IN EPIC , 4.0CM  . Chronic kidney disease   . Claudication ([place])   . Coronary artery disease 1997   CFX PCI, '97. Mod residual RI and RCA dis.  . Diabetes mellitus ([place])    IDDM  . Dyslipidemia   . Dyspnea   . GERD (gastroesophageal reflux disease)   . Hard of hearing   . History of bronchitis   . History of chemotherapy   . History of kidney stones 1958  . HTN (hypertension)   . Lacunar stroke ([place])   . Metastatic lung cancer (metastasis from lung to other site), unspecified laterality ([place])    DIAGNOSED 02-2018, MANAGED BY DR [place]  . NSTEMI (non-ST elevated myocardial infarction) ([place])   . PAD (peripheral artery disease) ([place])   . Pneumonia   . Tobacco abuse   . Urinary incontinence   . Wears glasses     Medications:  See med rec  Assessment: 77 yo male presented to ED with SOB. Currently on chemo for lung cancer. (Platelet count 73K-->MD aware) . Pt with afib and MD wants to start heparin.    Goal of Therapy:  Heparin level 0.3-0.7 units/ml Monitor platelets by anticoagulation protocol: Yes   Plan:  Give 4000 units bolus x 1 Start heparin infusion at 950 units/hr Check anti-Xa level in 6-8 hours and daily while on  heparin Continue to monitor H&H and platelets   Isac Sarna, BS Vena [place], BCPS Clinical Pharmacist Pager [phone] 07/08/2018,9:29 AM

## 2018-07-08 NOTE — Progress Notes (Signed)
Transferred from [facility] this afternoon-admitted with RVR and decompensated syst heart failure. Was on a cardizem gtt-but now has converted back to NSR. Informed by RN-that patient developed hypotension. On my eval -no visual issues-non focal exam. Is asymptomatic-off cardixem gtt-hold further diuresis, antihypertensives-suspect BP will improve. If persistently hypotensive will try gentIe VF bolus or midodrine. Cards notified that patient is now here in [facility].

## 2018-07-08 NOTE — Consult Note (Signed)
Cardiology Consultation:   Patient ID: Arthur [name] MRN: [id number]; DOB: [birth year]  Admit date: 07/08/2018 Date of Consult: 07/08/2018  Primary Care Provider: Dione Housekeeper, MD Primary Cardiologist: Quay Burow, MD   Patient Profile:   Arthur [name] is a 77 y.o. male with a hx of extensive small cell lung cancer, former tobacco abuse, CAD s/p PCI 1997, HTN, HLD, PAD s/p R iliofemoral endarterectomy 2017 who is being seen today for the evaluation of shortness of breath and palpitations at the request of Dr. Sloan Leiter.  History of Present Illness:   Mr. Mclure has been having progressive shortness of breath for the last several months. It was acutely worse this AM, and he also noted palpitations. He endorses progressive dyspnea on exertion, LE edema, and orthopnea. No syncope. He presented to [facility] ER and was found to have new atrial fibrillation with RVR. He was initially placed on nonrebreather mask, but after a dose of IV lasix was tolerating nasal cannula. He was transferred to [facility] for further management of acute on chronic systolic heart failure and atrial fib with RVR. On arrival, he was noted to be in NSR while on a diltiazem drip.  Labs are notable for platelets of 73, Hgb 7.8, WBC 19. Troponin is negative.   His cardiovascular history is notable for CAD s/p PCI in 1997 and PAD with R iliofemoral endarterectomy in 2017. Prior echoes noted normal EF, but echo 02/2018 noted EF of 35-40% with diffuse hypokinesis.  He currently feels very fatigued. His breathing is improved from earlier today. He is very anxious about having hypoglycemia as he has felt poorly with this in the past. No syncope but feels nearly syncopal when hypoglycemic. Did have a fall a few years ago. He went from 180 lbs to 153 lbs after chemo, and weight is back to the mid 160s. His appetite is improved but he also thinks much of it is fluid.   Past Medical History:  Diagnosis Date  . Arthritis   .  Ascending aortic aneurysm (HCC)    PER CT CHEST 03-08-18 IN [facility] , 4.0CM  . Chronic kidney disease   . Claudication ([place])   . Coronary artery disease 1997   CFX PCI, '97. Mod residual RI and RCA dis.  . Diabetes mellitus ([place])    IDDM  . Dyslipidemia   . Dyspnea   . GERD (gastroesophageal reflux disease)   . Hard of hearing   . History of bronchitis   . History of chemotherapy   . History of kidney stones 1958  . HTN (hypertension)   . Lacunar stroke ([place])   . Metastatic lung cancer (metastasis from lung to other site), unspecified laterality ([place])    DIAGNOSED 02-2018, MANAGED BY DR [place]  . NSTEMI (non-ST elevated myocardial infarction) ([place])   . PAD (peripheral artery disease) ([place])   . Pneumonia   . Tobacco abuse   . Urinary incontinence   . Wears glasses     Past Surgical History:  Procedure Laterality Date  . APPENDECTOMY     "busted on me"  . COLONOSCOPY    . CORONARY ANGIOPLASTY  1997   CFX  . CYSTOSCOPY WITH RETROGRADE PYELOGRAM, URETEROSCOPY AND STENT PLACEMENT Right 04/13/2018   Procedure: CYSTOSCOPY WITH RETROGRADE PYELOGRAM, URETEROSCOPY AND STENT PLACEMENT;  Surgeon: Alexis Frock, MD;  Location: [facility];  Service: Urology;  Laterality: Right;  1 HR  . ENDARTERECTOMY FEMORAL Right 05/13/2016   iliofemoral endarterectomy with bovine pericardial  patch angioplasty  . ENDARTERECTOMY FEMORAL Right 05/13/2016   Procedure: RIGHT ENDARTERECTOMY FEMORAL;  Surgeon: Serafina Mitchell, MD;  Location: [place];  Service: Vascular;  Laterality: Right;  . HOLMIUM LASER APPLICATION Right 7/61/9509   Procedure: HOLMIUM LASER APPLICATION;  Surgeon: Alexis Frock, MD;  Location: [facility];  Service: Urology;  Laterality: Right;  . PATCH ANGIOPLASTY Right 05/13/2016   Procedure: [place];  Surgeon: Serafina Mitchell, MD;  Location: [place];  Service: Vascular;  Laterality: Right;  . PERIPHERAL VASCULAR CATHETERIZATION N/A 02/20/2016     Procedure: Lower Extremity Angiography;  Surgeon: Lorretta Harp, MD;  Location: [place] [facility];  Service: Cardiovascular;  Laterality: N/A;     Home Medications:  Prior to Admission medications   Medication Sig Start Date End Date Taking? Authorizing Provider  amLODipine (NORVASC) 5 MG tablet Take 1 tablet (5 mg total) by mouth daily. 03/30/18 07/08/18 Yes Kilroy, Doreene Burke, PA-C  aspirin EC 81 MG EC tablet Take 1 tablet (81 mg total) by mouth daily. 03/17/18  Yes Elgergawy, Silver Huguenin, MD  atorvastatin (LIPITOR) 20 MG tablet TAKE 1 TABLET (20 MG TOTAL) BY MOUTH DAILY. Patient taking differently: Take 20 mg by mouth daily at 6 PM.  11/30/17  Yes Lorretta Harp, MD  clopidogrel (PLAVIX) 75 MG tablet Take 1 tablet (75 mg total) by mouth daily. 03/17/18  Yes Elgergawy, Silver Huguenin, MD  docusate sodium (COLACE) 100 MG capsule Take 100 mg by mouth 2 (two) times daily.   Yes [provider]  doxazosin (CARDURA) 1 MG tablet Take 3 mg by mouth at bedtime. Pt takes 3 at bedtime    Yes [provider]  fish oil-omega-3 fatty acids 1000 MG capsule Take 2 g by mouth daily.    Yes [provider]  insulin lispro (HUMALOG) 100 UNIT/ML injection Inject 0.35 mLs (35 Units total) into the skin 2 (two) times daily. Patient taking differently: Inject 25-40 Units into the skin 2 (two) times daily at 10 AM and 5 PM.  03/16/18  Yes Elgergawy, Silver Huguenin, MD  isosorbide mononitrate (IMDUR) 30 MG 24 hr tablet Take 1 tablet (30 mg total) by mouth daily. 04/05/18  Yes Kilroy, Luke K, PA-C  meloxicam (MOBIC) 7.5 MG tablet Take 7.5 mg by mouth daily.   Yes [provider]  metoprolol tartrate (LOPRESSOR) 50 MG tablet Take 1 tablet (50 mg total) by mouth 2 (two) times daily. 06/27/18  Yes Lorretta Harp, MD  nitroGLYCERIN (NITROSTAT) 0.4 MG SL tablet Place 1 tablet (0.4 mg total) under the tongue every 5 (five) minutes x 3 doses as needed for chest pain. 03/30/18  Yes Kilroy, Luke K, PA-C   oxybutynin (DITROPAN) 5 MG tablet Take 5 mg by mouth daily.    Yes [provider]  pantoprazole (PROTONIX) 40 MG tablet Take 1 tablet (40 mg total) by mouth daily. 03/16/18  Yes Elgergawy, Silver Huguenin, MD  polyethylene glycol (MIRALAX / GLYCOLAX) packet Take 17 g by mouth 2 (two) times daily.   Yes [provider]  senna-docusate (SENOKOT-S) 8.6-50 MG tablet Take 1 tablet by mouth 2 (two) times daily. While taking strong pain meds to prevent constipation 04/13/18  Yes Alexis Frock, MD  Sennosides (EX-LAX PO) Take 2 tablets by mouth daily as needed (constipation).   Yes [provider]  glucose blood (ONETOUCH VERIO) test strip CHECK BLOOD SUGARS 3 DAILY:DX 250.40 One touch verio 05/04/18   [provider]  Misc. Devices [place] 1 wheelchair for mobility. 04/27/18   [provider]  Nutritional Supplements (FEEDING SUPPLEMENT, GLUCERNA 1.2 CAL,) LIQD Take as directed by physician 05/17/18   [provider]  prochlorperazine (COMPAZINE) 10 MG tablet Take 1 tablet (10 mg total) by mouth every 6 (six) hours as needed for nausea or vomiting. Patient not taking: Reported on 07/08/2018 03/28/18   Curt Bears, MD  traMADol (ULTRAM) 50 MG tablet Take 1-2 tablets (50-100 mg total) by mouth every 6 (six) hours as needed for moderate pain or severe pain. Post-operatively Patient not taking: Reported on 07/08/2018 04/13/18   Alexis Frock, MD    Inpatient Medications: Scheduled Meds: . aspirin EC  81 mg Oral Daily  . atorvastatin  20 mg Oral q1800  . docusate sodium  100 mg Oral BID  . furosemide  40 mg Intravenous Daily  . insulin aspart  0-15 Units Subcutaneous TID WC  . insulin aspart  0-5 Units Subcutaneous QHS  . isosorbide mononitrate  30 mg Oral Daily  . [START ON 07/09/2018] metoprolol tartrate  25 mg Oral BID  . midodrine  5 mg Oral Once  . omega-3 acid ethyl esters  2 g Oral Daily  . oxybutynin  5 mg Oral Daily  . pantoprazole  40 mg Oral  Daily  . polyethylene glycol  17 g Oral BID  . senna-docusate  1 tablet Oral BID  . sodium chloride flush  3 mL Intravenous Q12H   Continuous Infusions: . sodium chloride    . diltiazem (CARDIZEM) infusion Stopped (07/08/18 1515)  . heparin 950 Units/hr (07/08/18 1022)   PRN Meds: sodium chloride, acetaminophen, ondansetron (ZOFRAN) IV, sodium chloride flush  Allergies:    Allergies  Allergen Reactions  . Codeine Swelling    Social History:   Social History   Socioeconomic History  . Marital status: Married    Spouse name: Not on file  . Number of children: Not on file  . Years of education: Not on file  . Highest education level: Not on file  Occupational History  . Not on file  Social Needs  . Financial resource strain: Not on file  . Food insecurity:    Worry: Not on file    Inability: Not on file  . Transportation needs:    Medical: Not on file    Non-medical: Not on file  Tobacco Use  . Smoking status: Former Smoker    Packs/day: 0.25    Years: 65.00    Pack years: 16.25    Types: Cigarettes  . Smokeless tobacco: Never Used  . Tobacco comment: 05/13/2016 "stopped smoking ~ 1 month ago"  Substance and Sexual Activity  . Alcohol use: No  . Drug use: No  . Sexual activity: Not Currently  Lifestyle  . Physical activity:    Days per week: Not on file    Minutes per session: Not on file  . Stress: Not on file  Relationships  . Social connections:    Talks on phone: Not on file    Gets together: Not on file    Attends religious service: Not on file    Active member of club or organization: Not on file    Attends meetings of clubs or organizations: Not on file    Relationship status: Not on file  . Intimate partner violence:    Fear of current or ex partner: Not on file    Emotionally abused: Not on file    Physically abused: Not on  file    Forced sexual activity: Not on file  Other Topics Concern  . Not on file  Social History Narrative   Retired  from [employer]. Married 2 children, 11 grand children, 6 great grand children    Family History:    Family History  Problem Relation Age of Onset  . Diabetes Sister   . Arthritis Mother   . Heart disease Father      ROS:  Please see the history of present illness.  Review of Systems  Constitutional: Positive for malaise/fatigue. Negative for fever.  HENT: Positive for hearing loss. Negative for sore throat.   Eyes: Negative for double vision.  Respiratory: Positive for shortness of breath. Negative for hemoptysis and sputum production.   Cardiovascular: Positive for palpitations, orthopnea and leg swelling. Negative for chest pain, claudication and PND.  Gastrointestinal: Negative for abdominal pain, blood in stool and melena.  Genitourinary: Negative for dysuria and hematuria.  Musculoskeletal: Positive for falls.  Neurological: Negative for focal weakness and loss of consciousness.  Endo/Heme/Allergies: Bruises/bleeds easily.   All other ROS reviewed and negative.     Physical Exam/Data:   Vitals:   07/08/18 1045 07/08/18 1218 07/08/18 1221 07/08/18 1523  BP: 130/63 131/77  (!) 84/48  Pulse: 95 (!) 101  (!) 112  Resp: 20 17  (!) 21  Temp:  98.3 F (36.8 C)    TempSrc:  Oral    SpO2: 100% 97% 97% 100%  Weight:  75.5 kg    Height:  5\' 9"  (1.753 m)     No intake or output data in the 24 hours ending 07/08/18 1617 Filed Weights   07/08/18 0454 07/08/18 1218  Weight: 78.2 kg 75.5 kg   Body mass index is 24.58 kg/m.  General:  Frail appearing elderly gentleman resting comfortably in bed HEENT: normal Lymph: no adenopathy Neck: JVD elevated mid neck at 90 degrees, ~13 cm Endocrine:  No thryomegaly Vascular: No carotid bruits; RA pulses 2+ bilaterally Cardiac:  normal S1, S2; RRR; no murmur Lungs:  clear to auscultation bilaterally in upper fields, with dullness at right base Abd: soft, nontender, no hepatomegaly  Ext: bilateral 1+ edema Musculoskeletal:  No  deformities Skin: warm and dry  Neuro:  no focal abnormalities noted Psych:  Normal affect   EKG:  The EKG was personally reviewed and demonstrates:  Atrial fib with RVR, PVC Telemetry:  Telemetry was personally reviewed and demonstrates:  Normal sinus rhythm in the 60s, episode of sinus tachycardia.  Relevant CV Studies: Echo 02/2018 - Left ventricle: The cavity size was normal. Systolic function was   moderately reduced. The estimated ejection fraction was in the   range of 35% to 40%. Diffuse hypokinesis. There is akinesis of   the inferior and inferoseptal myocardium. Doppler parameters are   consistent with abnormal left ventricular relaxation (grade 1   diastolic dysfunction). - Aortic valve: Trileaflet; mildly thickened, mildly calcified   leaflets. - Pericardium, extracardiac: A small pericardial effusion was   identified circumferential to the heart. There was no evidence of   hemodynamic compromise.  Impressions: - EF is decreased when compared to prior.  Laboratory Data:  Chemistry Recent Labs  Lab 07/08/18 0614  NA 138  K 3.5  CL 105  CO2 23  GLUCOSE 207*  BUN 14  CREATININE 0.89  CALCIUM 8.5*  GFRNONAA >60  GFRAA >60  ANIONGAP 10    Recent Labs  Lab 07/08/18 0614  PROT 6.6  ALBUMIN 3.6  AST 24  ALT 22  ALKPHOS 115  BILITOT 0.6   Hematology Recent Labs  Lab 07/08/18 0614  WBC 19.3*  RBC 2.33*  HGB 7.8*  HCT 25.5*  MCV 109.4*  MCH 33.5  MCHC 30.6  RDW 20.6*  PLT 73*   Cardiac Enzymes Recent Labs  Lab 07/08/18 0614  TROPONINI <0.03   No results for input(s): TROPIPOC in the last 168 hours.  BNP Recent Labs  Lab 07/08/18 0614  BNP 649.0*    DDimer No results for input(s): DDIMER in the last 168 hours.  Radiology/Studies:  Dg Chest Port 1 View  Result Date: 07/08/2018 CLINICAL DATA:  Shortness of breath EXAM: PORTABLE CHEST 1 VIEW COMPARISON:  Chest CT 06/14/2018 FINDINGS: Medium-sized right pleural effusion with associated  atelectasis. Aeration of the right lung is slightly worse than on the CT of 06/14/2018. There is moderate cardiomegaly. Left lung is well aerated without focal consolidation or pulmonary edema. IMPRESSION: Medium-sized right pleural effusion with associated atelectasis. Electronically Signed   By: Ulyses Jarred M.D.   On: 07/08/2018 06:00    Assessment and Plan:   Acute on chronic systolic heart failure: edema, JVD, R pleural effusion consistent with HF. Known low EF with progressive symptoms. While some of weight regain may be from improved appetite, suspect a large component is fluid.  -needs additional diuresis, but with soft blood pressures and improved symptoms, will hold for today. Restart with 40 IV lasix tomorrow AM -avoid diltiazem given low EF -holding imdur given hypotension -on metoprolol tartrate 25 mg BID; if heart rate remains stable, would consolidate to 50 mg metoprolol succinate daily prior to discharge. Home dose was 50 mg tartrate BID. -no room for ACEi/ARB now, but would consider starting this in place of other antihypertensives given low EF prior to discharge  New onset afib with RVR: now converted to NSR -on heparin drip, watch CBC closely -CHA2DS2/VAS= 6; High Risk. On heparin drip. Long term, he needs a discussion regarding his risk of stroke without anticoagulation vs. Risk of bleeding with AC, especially given that he is anemic and thrombocytopenic. If these CBC abnormalities are expected to be only short term, would be reasonable to do long term AC. If the abnormalities are expected to worsen with continued treatment, then would be very concerned for risk of bleeding.  Hypertension: now hypotensive -hold home amlodipine -hold home doxazosin -consider changing to ACEi/ARB as above given low EF and diabetes  CAD/PAD: no active symptoms -no recent stents, and he is currently thrombocytopenic and anemic. Holding aspirin and clopidogrel -on atorvastatin 20 mg  We will  continue to follow.  For questions or updates, please contact [place] Please consult [email or web address] for contact info under   Signed, Buford Dresser, MD  07/08/2018 4:17 PM

## 2018-07-08 NOTE — ED Triage Notes (Signed)
Patient has a continuous shortness of breath, since starting treatment 4 months ago for lung cancer, given duo neb treatment and placed on nonrebreather 15 liters by EMS.

## 2018-07-08 NOTE — Progress Notes (Signed)
Pt stated he felt glucose was droping. BS 172. BP 84/54. Pt states dizzy/blurred vision. Diltiazem drip stopped. Dr. Sloan Leiter notified. Cont to monitor. Carroll Kinds RN

## 2018-07-09 LAB — GLUCOSE, CAPILLARY
GLUCOSE-CAPILLARY: 112 mg/dL — AB (ref 70–99)
Glucose-Capillary: 148 mg/dL — ABNORMAL HIGH (ref 70–99)
Glucose-Capillary: 139 mg/dL — ABNORMAL HIGH (ref 70–99)
Glucose-Capillary: 94 mg/dL (ref 70–99)

## 2018-07-09 LAB — CBC WITH DIFFERENTIAL/PLATELET
ABS IMMATURE GRANULOCYTES: 0.37 10*3/uL — AB (ref 0.00–0.07)
Basophils Absolute: 0.1 10*3/uL (ref 0.0–0.1)
Basophils Relative: 0 %
Eosinophils Absolute: 0.1 10*3/uL (ref 0.0–0.5)
Eosinophils Relative: 0 %
HCT: 23.2 % — ABNORMAL LOW (ref 39.0–52.0)
HEMOGLOBIN: 7.4 g/dL — AB (ref 13.0–17.0)
Immature Granulocytes: 2 %
Lymphocytes Relative: 9 %
Lymphs Abs: 1.5 10*3/uL (ref 0.7–4.0)
MCH: 34.7 pg — ABNORMAL HIGH (ref 26.0–34.0)
MCHC: 31.9 g/dL (ref 30.0–36.0)
MCV: 108.9 fL — ABNORMAL HIGH (ref 80.0–100.0)
Monocytes Absolute: 1.2 10*3/uL — ABNORMAL HIGH (ref 0.1–1.0)
Monocytes Relative: 7 %
NEUTROS ABS: 13.6 10*3/uL — AB (ref 1.7–7.7)
Neutrophils Relative %: 82 %
Platelets: 99 10*3/uL — ABNORMAL LOW (ref 150–400)
RBC: 2.13 MIL/uL — ABNORMAL LOW (ref 4.22–5.81)
RDW: 20.5 % — ABNORMAL HIGH (ref 11.5–15.5)
WBC: 16.8 10*3/uL — ABNORMAL HIGH (ref 4.0–10.5)
nRBC: 0.2 % (ref 0.0–0.2)

## 2018-07-09 LAB — HEPARIN LEVEL (UNFRACTIONATED): Heparin Unfractionated: 0.4 IU/mL (ref 0.30–0.70)

## 2018-07-09 LAB — BASIC METABOLIC PANEL
Anion gap: 12 (ref 5–15)
BUN: 18 mg/dL (ref 8–23)
CO2: 26 mmol/L (ref 22–32)
Calcium: 8.5 mg/dL — ABNORMAL LOW (ref 8.9–10.3)
Chloride: 100 mmol/L (ref 98–111)
Creatinine, Ser: 1.45 mg/dL — ABNORMAL HIGH (ref 0.61–1.24)
GFR calc Af Amer: 53 mL/min — ABNORMAL LOW (ref >60)
GFR calc non Af Amer: 45 mL/min — ABNORMAL LOW (ref >60)
Glucose, Bld: 132 mg/dL — ABNORMAL HIGH (ref 70–99)
Potassium: 3.9 mmol/L (ref 3.5–5.1)
Sodium: 138 mmol/L (ref 135–145)

## 2018-07-09 NOTE — Progress Notes (Addendum)
[place] for Heparin Indication: atrial fibrillation  Allergies  Allergen Reactions  . Codeine Swelling    Patient Measurements: Height: 5\' 9"  (175.3 cm) Weight: 167 lb 8 oz (76 kg) IBW/kg (Calculated) : 70.7 HEPARIN DW (KG): 75.5  Vital Signs: Temp: 98.9 F (37.2 C) (12/21 0343) Temp Source: Oral (12/21 0343) BP: 160/90 (12/21 0343) Pulse Rate: 125 (12/21 0343)  Labs: Recent Labs    07/08/18 0614 07/08/18 1818 07/09/18 0328  HGB 7.8*  --  7.4*  HCT 25.5*  --  23.2*  PLT 73*  --  99*  APTT 30  --   --   HEPARINUNFRC  --  0.20* 0.40  CREATININE 0.89  --  1.45*  TROPONINI <0.03  --   --     Estimated Creatinine Clearance: 41.3 mL/min (A) (by C-G formula based on SCr of 1.45 mg/dL (H)).  Assessment: 77 yo male presented to ED with SOB. Pharmacy consulted for heparin dosing for afib.  Currently on chemo for lung cancer. Hgb 7.8 and plt 73K - MD is aware and ok with starting heparin. Heparin level therapeutic  Goal of Therapy:  Heparin level 0.3-0.7 units/ml Monitor platelets by anticoagulation protocol: Yes   Plan:  Cont heparin at 1100 units/hr Trend Hgb  Thank you Anette Guarneri, PharmD [phone]

## 2018-07-09 NOTE — Progress Notes (Signed)
Progress Note  Patient Name: Arthur [name] Date of Encounter: 07/09/2018  Primary Cardiologist: Quay Burow, MD    Subjective   Very dyspneic Frustrated that he can't do anything Needs to sit up in bed to breath  Objectively No distress and sats 100%  Inpatient Medications    Scheduled Meds: . aspirin EC  81 mg Oral Daily  . atorvastatin  20 mg Oral q1800  . docusate sodium  100 mg Oral BID  . furosemide  40 mg Intravenous Daily  . insulin aspart  0-15 Units Subcutaneous TID WC  . insulin aspart  0-5 Units Subcutaneous QHS  . metoprolol tartrate  25 mg Oral BID  . midodrine  5 mg Oral Once  . omega-3 acid ethyl esters  2 g Oral Daily  . oxybutynin  5 mg Oral Daily  . pantoprazole  40 mg Oral Daily  . polyethylene glycol  17 g Oral BID  . senna-docusate  1 tablet Oral BID  . sodium chloride flush  3 mL Intravenous Q12H   Continuous Infusions: . sodium chloride    . heparin 1,100 Units/hr (07/09/18 0409)   PRN Meds: sodium chloride, acetaminophen, ondansetron (ZOFRAN) IV, sodium chloride flush   Vital Signs    Vitals:   07/08/18 1949 07/09/18 0004 07/09/18 0343 07/09/18 0749  BP: (!) 86/46 (!) 96/50 (!) 160/90 128/76  Pulse: 77 80 (!) 125 99  Resp: (!) 26 (!) 21 (!) 26 (!) 27  Temp: 98.7 F (37.1 C) 98.4 F (36.9 C) 98.9 F (37.2 C) 97.8 F (36.6 C)  TempSrc: Oral Oral Oral Oral  SpO2:  100% 95% 95%  Weight:   76 kg   Height:        Intake/Output Summary (Last 24 hours) at 07/09/2018 0920 Last data filed at 07/09/2018 0410 Gross per 24 hour  Intake 659.55 ml  Output 525 ml  Net 134.55 ml   Filed Weights   07/08/18 0454 07/08/18 1218 07/09/18 0343  Weight: 78.2 kg 75.5 kg 76 kg    Telemetry    NSR rates 90's  - Personally Reviewed  ECG    SR no acute changes  - Personally Reviewed  Physical Exam  Chronically ill white male  GEN: No acute distress.   Neck: No JVD Cardiac: RRR, no murmurs, rubs, or gallops.  Respiratory: Decreased BS  right base  GI: Soft, nontender, non-distended  MS: No edema; No deformity. Neuro:  Nonfocal  Psych: Normal affect   Labs    Chemistry Recent Labs  Lab 07/08/18 0614 07/09/18 0328  NA 138 138  K 3.5 3.9  CL 105 100  CO2 23 26  GLUCOSE 207* 132*  BUN 14 18  CREATININE 0.89 1.45*  CALCIUM 8.5* 8.5*  PROT 6.6  --   ALBUMIN 3.6  --   AST 24  --   ALT 22  --   ALKPHOS 115  --   BILITOT 0.6  --   GFRNONAA >60 45*  GFRAA >60 53*  ANIONGAP 10 12     Hematology Recent Labs  Lab 07/08/18 0614 07/09/18 0328  WBC 19.3* 16.8*  RBC 2.33* 2.13*  HGB 7.8* 7.4*  HCT 25.5* 23.2*  MCV 109.4* 108.9*  MCH 33.5 34.7*  MCHC 30.6 31.9  RDW 20.6* 20.5*  PLT 73* 99*    Cardiac Enzymes Recent Labs  Lab 07/08/18 0614  TROPONINI <0.03   No results for input(s): TROPIPOC in the last 168 hours.   BNP Recent Labs  Lab 07/08/18 0614  BNP 649.0*     DDimer No results for input(s): DDIMER in the last 168 hours.   Radiology    Dg Chest Port 1 View  Result Date: 07/08/2018 CLINICAL DATA:  Shortness of breath EXAM: PORTABLE CHEST 1 VIEW COMPARISON:  Chest CT 06/14/2018 FINDINGS: Medium-sized right pleural effusion with associated atelectasis. Aeration of the right lung is slightly worse than on the CT of 06/14/2018. There is moderate cardiomegaly. Left lung is well aerated without focal consolidation or pulmonary edema. IMPRESSION: Medium-sized right pleural effusion with associated atelectasis. Electronically Signed   By: Ulyses Jarred M.D.   On: 07/08/2018 06:00    Cardiac Studies   TTE : EF 35-40%   Patient Profile     Arthur [name] [name] is a 77 y.o. male with a hx of extensive small cell lung cancer, former tobacco abuse, CAD s/p PCI 1997, HTN, HLD, PAD s/p R iliofemoral endarterectomy 2017 who is being seen  for the evaluation of shortness of breath and palpitations at the request of Dr. Sloan Leiter.  Assessment & Plan     PAF:  In NSR PLT/HCt down from chemoRx advanced lung  cancer. Schedule for more chemo next week Would not send home on oral anticoagulation and if he maintains NSR another 24 hours would d/c heparin Continue beta blocker   CHF:  Continue diuresis EF 35-40% Subjective dyspnea with sats 100% consider thoracentesis of likely malignant Right pleural effusion Continue iv lasix       For questions or updates, please contact [facility] Please consult [email or web address] for contact info under        Signed, Jenkins Rouge, MD  07/09/2018, 9:20 AM

## 2018-07-09 NOTE — Progress Notes (Addendum)
PROGRESS NOTE    Arthur [name]  EGB:[id number] DOB: 04-07-1941 DOA: 07/08/2018 PCP: Dione Housekeeper, MD   Brief Narrative: Patient is a 77 year old male with past medical history of advanced small cell carcinoma of the lung, diabetes mellitus, coronary disease, peripheral vascular disease, hypertension, hyperlipidemia who presented with complaints of shortness of breath that significantly worsened recently.  On presentation he was found to be in A. fib with RVR with heart rate of 130s.  Started on Cardizem drip with improvement in the heart rate.  Chest x-ray also showed moderate right-sided pleural effusion.  [facility] for the management of A. fib with RVR.  Cardiology following.  Assessment & Plan:   Active Problems:   Essential hypertension   PAD (peripheral artery disease) (HCC)   Small cell lung cancer, right (HCC)   Acute on chronic combined systolic and diastolic congestive heart failure (HCC)   Acute respiratory failure with hypoxia (HCC)   Atrial fibrillation with RVR ([place])  New onset A. fib with RVR: Currently in normal sinus rhythm.  Cardiology planning to continue heparin for next 24 hours.  No plan for continuation of anticoagulation therapy.  Had to be put on diltiazem drip on admission which has been held  now.CHADS-VASc = 6.  Continue home dose metoprolol.  Currently rate is controlled.  Acute on chronic systolic/diastolic CHF: Echocardiogram done on 03/09/2018 had shown ejection fraction of 35 to 40%, diffuse hypokinesis, grade 1 diastolic dysfunction.  Presented with bilateral lower extremity edema.  Was on Lasix 40 mg IV daily but has to be held from now because of worsening kidney function.  Acute respiratory failure with hypoxia: Secondary to pulmonary edema and pleural effusion.  Initially had to be put on nonrebreather.  Currently respiration is  stable.  He has moderate amount of right pleural effusion most likely associated with  CHF and maintenance.  Will order for US guided thoracentesis of the right side.  Small cell carcinoma of the lung: Advanced with mets to lymph nodes and scapula.  Last chemotherapy on 06/20/2018.  Was on Tecentriq.  He follows with Dr. Julien Nordmann.  Anemia/Thrombocytopenia: Most likely secondary to malignancy, chemotherapy.   We will continue to monitor.  His baseline hemoglobin ranges from 7-8.5.  He has been transfused in the past for anemia.  Acute kidney injury: Creatinine bumped up to 1.45 today.  Will hold Lasix.  Diabetes type 2: Continue sliding scale scale.  Last hemoglobin A1C was 7.1 on 03/13/2018  Essential hypertension: Currently blood pressure stable.  Continue beta-blocker.  On amlodipine at home which we made continue because of bilateral lower extremity edema.  Also on Cardura at home.  Leukocytosis: Most likely stress related.  No signs of infection.  We will continue to monitor the trend.  Follow-up cultures.  Hyperlipidemia: Continue statin  History of coronary artery disease: Currently denies any chest pain.  On Plavix at home which is on hold because of IV heparin.  Continue Imdur.  EKG not show any ischemic changes.  Deconditioning/debility: Reports of generalized weakness.  Will request for physical therapy evaluation.           DVT prophylaxis: Heparin IV Code Status: Full Family Communication: None present at the bedside Disposition Plan: Home versus skilled nursing facility pending PT evaluation and further work-up   Consultants: Cardiology  Procedures: None  Antimicrobials: None  Subjective: Patient seen and examined at bedside this afternoon.  Looks comfortable now.  Sitting on the chair.  Denies any shortness of breath during my evaluation.  Objective: Vitals:   07/09/18 0343 07/09/18 0749 07/09/18 1008 07/09/18 1138  BP: (!) 160/90 128/76 133/81 (!) 113/98  Pulse: (!) 125 99 94 88  Resp: (!) 26 (!) 27  (!) 21  Temp: 98.9 F (37.2 C) 97.8 F  (36.6 C)    TempSrc: Oral Oral    SpO2: 95% 95%  100%  Weight: 76 kg     Height:        Intake/Output Summary (Last 24 hours) at 07/09/2018 1419 Last data filed at 07/09/2018 1015 Gross per 24 hour  Intake 839.55 ml  Output 525 ml  Net 314.55 ml   Filed Weights   07/08/18 0454 07/08/18 1218 07/09/18 0343  Weight: 78.2 kg 75.5 kg 76 kg    Examination:  General exam: Not in distress,average built HEENT:PERRL,Oral mucosa moist, Ear/Nose normal on gross exam Respiratory system: Decreased  air entry on the right side, no wheezes or crackles  Cardiovascular system: S1 & S2 heard, RRR. No JVD, murmurs, rubs, gallops or clicks. 1-2  pedal edema. Gastrointestinal system: Abdomen is nondistended, soft and nontender. No organomegaly or masses felt. Normal bowel sounds heard. Central nervous system: Alert and oriented. No focal neurological deficits. Extremities: 1-2 lower extremity edema, no clubbing ,no cyanosis, distal peripheral pulses palpable. Skin: No rashes, lesions or ulcers,no icterus ,no pallor MSK: Normal muscle bulk,tone ,power Psychiatry: Judgement and insight appear normal. Mood & affect appropriate.     Data Reviewed: I have personally reviewed following labs and imaging studies  CBC: Recent Labs  Lab 07/08/18 0614 07/09/18 0328  WBC 19.3* 16.8*  NEUTROABS 16.0* 13.6*  HGB 7.8* 7.4*  HCT 25.5* 23.2*  MCV 109.4* 108.9*  PLT 73* 99*   Basic Metabolic Panel: Recent Labs  Lab 07/08/18 0614 07/09/18 0328  NA 138 138  K 3.5 3.9  CL 105 100  CO2 23 26  GLUCOSE 207* 132*  BUN 14 18  CREATININE 0.89 1.45*  CALCIUM 8.5* 8.5*   GFR: Estimated Creatinine Clearance: 41.3 mL/min (A) (by C-G formula based on SCr of 1.45 mg/dL (H)). Liver Function Tests: Recent Labs  Lab 07/08/18 0614  AST 24  ALT 22  ALKPHOS 115  BILITOT 0.6  PROT 6.6  ALBUMIN 3.6   No results for input(s): LIPASE, AMYLASE in the last 168 hours. No results for input(s): AMMONIA in the  last 168 hours. Coagulation Profile: No results for input(s): INR, PROTIME in the last 168 hours. Cardiac Enzymes: Recent Labs  Lab 07/08/18 0614  TROPONINI <0.03   BNP (last 3 results) No results for input(s): PROBNP in the last 8760 hours. HbA1C: No results for input(s): HGBA1C in the last 72 hours. CBG: Recent Labs  Lab 07/08/18 1213 07/08/18 1504 07/08/18 2111 07/09/18 0747 07/09/18 1134  GLUCAP 192* 172* 113* 148* 112*   Lipid Profile: No results for input(s): CHOL, HDL, LDLCALC, TRIG, CHOLHDL, LDLDIRECT in the last 72 hours. Thyroid Function Tests: No results for input(s): TSH, T4TOTAL, FREET4, T3FREE, THYROIDAB in the last 72 hours. Anemia Panel: No results for input(s): VITAMINB12, FOLATE, FERRITIN, TIBC, IRON, RETICCTPCT in the last 72 hours. Sepsis Labs: No results for input(s): PROCALCITON, LATICACIDVEN in the last 168 hours.  No results found for this or any previous visit (from the past 240 hour(s)).       Radiology Studies: Dg Chest Port 1 View  Result Date: 07/08/2018 CLINICAL DATA:  Shortness of breath EXAM: PORTABLE CHEST 1 VIEW COMPARISON:  Chest CT 06/14/2018 FINDINGS: Medium-sized right pleural effusion with associated atelectasis. Aeration of the right lung is slightly worse than on the CT of 06/14/2018. There is moderate cardiomegaly. Left lung is well aerated without focal consolidation or pulmonary edema. IMPRESSION: Medium-sized right pleural effusion with associated atelectasis. Electronically Signed   By: Ulyses Jarred M.D.   On: 07/08/2018 06:00        Scheduled Meds: . aspirin EC  81 mg Oral Daily  . atorvastatin  20 mg Oral q1800  . docusate sodium  100 mg Oral BID  . insulin aspart  0-15 Units Subcutaneous TID WC  . insulin aspart  0-5 Units Subcutaneous QHS  . metoprolol tartrate  25 mg Oral BID  . midodrine  5 mg Oral Once  . omega-3 acid ethyl esters  2 g Oral Daily  . oxybutynin  5 mg Oral Daily  . pantoprazole  40 mg Oral  Daily  . polyethylene glycol  17 g Oral BID  . senna-docusate  1 tablet Oral BID  . sodium chloride flush  3 mL Intravenous Q12H   Continuous Infusions: . sodium chloride    . heparin 1,100 Units/hr (07/09/18 0409)     LOS: 1 day    Time spent: 35 mins.More than 50% of that time was spent in counseling and/or coordination of care.      Shelly Coss, MD [facility] Pager [phone]  If 7PM-7AM, please contact night-coverage [email or web address] Password [facility] 07/09/2018, 2:19 PM

## 2018-07-10 ENCOUNTER — Inpatient Hospital Stay (HOSPITAL_COMMUNITY): Payer: Medicare Other

## 2018-07-10 LAB — BASIC METABOLIC PANEL
Anion gap: 13 (ref 5–15)
BUN: 18 mg/dL (ref 8–23)
CO2: 27 mmol/L (ref 22–32)
Calcium: 9.1 mg/dL (ref 8.9–10.3)
Chloride: 98 mmol/L (ref 98–111)
Creatinine, Ser: 1.13 mg/dL (ref 0.61–1.24)
GFR calc Af Amer: >60 mL/min (ref >60)
GLUCOSE: 117 mg/dL — AB (ref 70–99)
Potassium: 3.9 mmol/L (ref 3.5–5.1)
Sodium: 138 mmol/L (ref 135–145)

## 2018-07-10 LAB — CBC
HCT: 26.8 % — ABNORMAL LOW (ref 39.0–52.0)
Hemoglobin: 8.2 g/dL — ABNORMAL LOW (ref 13.0–17.0)
MCH: 32.8 pg (ref 26.0–34.0)
MCHC: 30.6 g/dL (ref 30.0–36.0)
MCV: 107.2 fL — ABNORMAL HIGH (ref 80.0–100.0)
Platelets: 133 10*3/uL — ABNORMAL LOW (ref 150–400)
RBC: 2.5 MIL/uL — ABNORMAL LOW (ref 4.22–5.81)
RDW: 19.9 % — ABNORMAL HIGH (ref 11.5–15.5)
WBC: 13.6 10*3/uL — ABNORMAL HIGH (ref 4.0–10.5)
nRBC: 0.1 % (ref 0.0–0.2)

## 2018-07-10 LAB — HEPARIN LEVEL (UNFRACTIONATED): Heparin Unfractionated: 0.38 IU/mL (ref 0.30–0.70)

## 2018-07-10 LAB — GLUCOSE, CAPILLARY
Glucose-Capillary: 106 mg/dL — ABNORMAL HIGH (ref 70–99)
Glucose-Capillary: 154 mg/dL — ABNORMAL HIGH (ref 70–99)
Glucose-Capillary: 83 mg/dL (ref 70–99)
Glucose-Capillary: 189 mg/dL — ABNORMAL HIGH (ref 70–99)

## 2018-07-10 MED ORDER — FUROSEMIDE 20 MG PO TABS
20.0000 mg | ORAL_TABLET | Freq: Every day | ORAL | Status: DC
  Administered 2018-07-10 – 2018-07-11 (×2): 20 mg via ORAL
  Filled 2018-07-10 (×2): qty 1

## 2018-07-10 MED ORDER — LIDOCAINE HCL (PF) 1 % IJ SOLN
INTRAMUSCULAR | Status: AC
  Filled 2018-07-10: qty 30

## 2018-07-10 NOTE — Progress Notes (Signed)
PT Cancellation Note  Patient Details Name: Takahiro Godinho MRN: [id number] DOB: [birth year]   Cancelled Treatment:    Reason Eval/Treat Not Completed: Other (comment)   Politely declining PT at this time (would like to eat breakfast);  Will plan to return later today as time allows;   Roney [place], [place]  [facility] Pager [phone] Office [phone]    [place] 07/10/2018, 8:21 AM

## 2018-07-10 NOTE — Progress Notes (Signed)
Progress Note  Patient Name: Arthur [name] Date of Encounter: 07/10/2018  Primary Cardiologist: Quay Burow, MD    Subjective   No complaints sitting in chair Indicates more dyspnea laying flat  Inpatient Medications    Scheduled Meds: . aspirin EC  81 mg Oral Daily  . atorvastatin  20 mg Oral q1800  . docusate sodium  100 mg Oral BID  . insulin aspart  0-15 Units Subcutaneous TID WC  . insulin aspart  0-5 Units Subcutaneous QHS  . metoprolol tartrate  25 mg Oral BID  . midodrine  5 mg Oral Once  . omega-3 acid ethyl esters  2 g Oral Daily  . oxybutynin  5 mg Oral Daily  . pantoprazole  40 mg Oral Daily  . polyethylene glycol  17 g Oral BID  . senna-docusate  1 tablet Oral BID  . sodium chloride flush  3 mL Intravenous Q12H   Continuous Infusions: . sodium chloride    . heparin 1,100 Units/hr (07/10/18 0359)   PRN Meds: sodium chloride, acetaminophen, ondansetron (ZOFRAN) IV, sodium chloride flush   Vital Signs    Vitals:   07/09/18 1625 07/09/18 1949 07/10/18 0003 07/10/18 0452  BP: 129/77 129/72 122/63 122/69  Pulse: 96 99 93 85  Resp: (!) 24 20 20  (!) 25  Temp: 97.9 F (36.6 C) 98.4 F (36.9 C) 98.4 F (36.9 C) 97.8 F (36.6 C)  TempSrc: Oral Oral Oral Oral  SpO2: 100%  97% 100%  Weight:    74 kg  Height:        Intake/Output Summary (Last 24 hours) at 07/10/2018 0748 Last data filed at 07/10/2018 0402 Gross per 24 hour  Intake 780 ml  Output 2800 ml  Net -2020 ml   Filed Weights   07/08/18 1218 07/09/18 0343 07/10/18 0452  Weight: 75.5 kg 76 kg 74 kg    Telemetry    NSR rates 90's  - Personally Reviewed  ECG    SR no acute changes  - Personally Reviewed  Physical Exam  Chronically ill white male  GEN: No acute distress.   Neck: No JVD Cardiac: RRR, no murmurs, rubs, or gallops.  Respiratory: Decreased BS right base  With atelectatic crackles  GI: Soft, nontender, non-distended  MS: No edema; No deformity. Neuro:  Nonfocal    Psych: Normal affect   Labs    Chemistry Recent Labs  Lab 07/08/18 0614 07/09/18 0328  NA 138 138  K 3.5 3.9  CL 105 100  CO2 23 26  GLUCOSE 207* 132*  BUN 14 18  CREATININE 0.89 1.45*  CALCIUM 8.5* 8.5*  PROT 6.6  --   ALBUMIN 3.6  --   AST 24  --   ALT 22  --   ALKPHOS 115  --   BILITOT 0.6  --   GFRNONAA >60 45*  GFRAA >60 53*  ANIONGAP 10 12     Hematology Recent Labs  Lab 07/08/18 0614 07/09/18 0328  WBC 19.3* 16.8*  RBC 2.33* 2.13*  HGB 7.8* 7.4*  HCT 25.5* 23.2*  MCV 109.4* 108.9*  MCH 33.5 34.7*  MCHC 30.6 31.9  RDW 20.6* 20.5*  PLT 73* 99*    Cardiac Enzymes Recent Labs  Lab 07/08/18 0614  TROPONINI <0.03   No results for input(s): TROPIPOC in the last 168 hours.   BNP Recent Labs  Lab 07/08/18 0614  BNP 649.0*     DDimer No results for input(s): DDIMER in the last 168 hours.  Radiology    No results found.  Cardiac Studies   TTE : EF 35-40%   Patient Profile     Arthur [name] is a 77 y.o. male with a hx of extensive small cell lung cancer, former tobacco abuse, CAD s/p PCI 1997, HTN, HLD, PAD s/p R iliofemoral endarterectomy 2017 who is being seen  for the evaluation of shortness of breath and palpitations at the request of Dr. Sloan Leiter.  Assessment & Plan     PAF:  In NSR PLT/HCt down from chemoRx advanced lung cancer. Schedule for more chemo next week Would not send home on oral anticoagulation D/C heparin this am   CHF:  Good diuresis 2 liters  EF 35-40% Subjective dyspnea with sats 100% consider thoracentesis of likely malignant Right pleural effusion Hold lasix today with Cr 1.4       For questions or updates, please contact [facility] Please consult [email or web address] for contact info under        Signed, Jenkins Rouge, MD  07/10/2018, 7:48 AM

## 2018-07-10 NOTE — Progress Notes (Signed)
PROGRESS NOTE    Arthur [name]  RDE:[id number] DOB: 1940-12-25 DOA: 07/08/2018 PCP: Dione Housekeeper, MD   Brief Narrative: Patient is a 77 year old male with past medical history of advanced small cell carcinoma of the lung, diabetes mellitus, coronary disease, peripheral vascular disease, hypertension, hyperlipidemia who presented with complaints of shortness of breath that significantly worsened recently.  On presentation he was found to be in A. fib with RVR with heart rate of 130s.  Started on Cardizem drip with improvement in the heart rate.  Chest x-ray also showed moderate right-sided pleural effusion.  [facility] for the management of A. fib with RVR.  Cardiology was following.  He underwent right-sided thoracentesis with removal of 800 mL of clear yellow fluid.  Waiting for PT evaluation today.  Assessment & Plan:   Active Problems:   Essential hypertension   PAD (peripheral artery disease) (HCC)   Small cell lung cancer, right (HCC)   Acute on chronic combined systolic and diastolic congestive heart failure (HCC)   Acute respiratory failure with hypoxia (HCC)   Atrial fibrillation with RVR ([place])  New onset A. fib with RVR: Currently in normal sinus rhythm.  Cardiology planning to discontinue heparin this morning.  No plan for continuation of anticoagulation therapy.  Had to be put on diltiazem drip on admission which has been held  now.CHADS-VASc = 6.  Continue home dose metoprolol.  Currently rate is controlled.  Acute on chronic systolic/diastolic CHF: Echocardiogram done on 03/09/2018 had shown ejection fraction of 35 to 40%, diffuse hypokinesis, grade 1 diastolic dysfunction.  Presented with bilateral lower extremity edema.  Was on Lasix 40 mg IV daily but has to be held because of worsening kidney function.  Started  on Lasix 20 mg daily .  Acute respiratory failure with hypoxia: Secondary to pulmonary edema and pleural effusion.  Initially  had to be put on nonrebreather.  Currently respiration is  stable.  He has moderate amount of right pleural effusion most likely associated with CHF and maintenance.  Underwent US guided right-sided thoracentesis with removal of 800 mL of clear yellow fluid.  Small cell carcinoma of the lung: Advanced with mets to lymph nodes and scapula.  Last chemotherapy on 06/20/2018.  Was on Tecentriq.  He follows with Dr. Julien Nordmann.  Anemia/Thrombocytopenia: Most likely secondary to malignancy, chemotherapy.   We will continue to monitor.  His baseline hemoglobin ranges from 7-8.5.  He has been transfused in the past for anemia.  Acute kidney injury: Creatinine improved after holding IV Lasix..  Diabetes type 2: Continue sliding scale scale.  Last hemoglobin A1C was 7.1 on 03/13/2018  Essential hypertension: Currently blood pressure stable.  Continue beta-blocker.  On amlodipine, Cardura at home.  Leukocytosis: Most likely stress related.Improving.  No signs of infection.  We will continue to monitor the trend.  Follow-up cultures.  Hyperlipidemia: Continue statin  History of coronary artery disease: Currently denies any chest pain.  On Plavix at home which is on hold because of IV heparin.Will resume on DC.  Continue Imdur.  EKG not show any ischemic changes.  Deconditioning/debility: Reports of generalized weakness.  Will request for physical therapy evaluation.           DVT prophylaxis: Heparin IV Code Status: Full Family Communication: None present at the bedside Disposition Plan: Home versus skilled nursing facility pending PT evaluation .   Consultants: Cardiology  Procedures: None  Antimicrobials: None  Subjective: Patient seen and examined at bedside  this afternoon.  Looks comfortable now.  Sitting on the chair.  Denies any shortness of breath during my evaluation.  Respiratory status stable  Objective: Vitals:   07/10/18 0452 07/10/18 0817 07/10/18 0840 07/10/18 0912  BP:  122/69 138/74 133/67 120/71  Pulse: 85 94    Resp: (!) 25 (!) 24    Temp: 97.8 F (36.6 C) 98.1 F (36.7 C)    TempSrc: Oral Oral    SpO2: 100% 99%    Weight: 74 kg     Height:        Intake/Output Summary (Last 24 hours) at 07/10/2018 1015 Last data filed at 07/10/2018 0402 Gross per 24 hour  Intake 360 ml  Output 2400 ml  Net -2040 ml   Filed Weights   07/08/18 1218 07/09/18 0343 07/10/18 0452  Weight: 75.5 kg 76 kg 74 kg    Examination:  General exam: Not in distress,average built HEENT:PERRL,Oral mucosa moist, Ear/Nose normal on gross exam Respiratory system: Decreased  air entry on the right side, no wheezes or crackles  Cardiovascular system: S1 & S2 heard, RRR. No JVD, murmurs, rubs, gallops or clicks. trace pedal edema. Gastrointestinal system: Abdomen is nondistended, soft and nontender. No organomegaly or masses felt. Normal bowel sounds heard. Central nervous system: Alert and oriented. No focal neurological deficits. Extremities: Trace lower extremity edema, no clubbing ,no cyanosis, distal peripheral pulses palpable. Skin: No rashes, lesions or ulcers,no icterus ,no pallor MSK: Normal muscle bulk,tone ,power Psychiatry: Judgement and insight appear normal. Mood & affect appropriate.     Data Reviewed: I have personally reviewed following labs and imaging studies  CBC: Recent Labs  Lab 07/08/18 0614 07/09/18 0328 07/10/18 0725  WBC 19.3* 16.8* 13.6*  NEUTROABS 16.0* 13.6*  --   HGB 7.8* 7.4* 8.2*  HCT 25.5* 23.2* 26.8*  MCV 109.4* 108.9* 107.2*  PLT 73* 99* 599*   Basic Metabolic Panel: Recent Labs  Lab 07/08/18 0614 07/09/18 0328 07/10/18 0725  NA 138 138 138  K 3.5 3.9 3.9  CL 105 100 98  CO2 23 26 27   GLUCOSE 207* 132* 117*  BUN 14 18 18   CREATININE 0.89 1.45* 1.13  CALCIUM 8.5* 8.5* 9.1   GFR: Estimated Creatinine Clearance: 53 mL/min (by C-G formula based on SCr of 1.13 mg/dL). Liver Function Tests: Recent Labs  Lab  07/08/18 0614  AST 24  ALT 22  ALKPHOS 115  BILITOT 0.6  PROT 6.6  ALBUMIN 3.6   No results for input(s): LIPASE, AMYLASE in the last 168 hours. No results for input(s): AMMONIA in the last 168 hours. Coagulation Profile: No results for input(s): INR, PROTIME in the last 168 hours. Cardiac Enzymes: Recent Labs  Lab 07/08/18 0614  TROPONINI <0.03   BNP (last 3 results) No results for input(s): PROBNP in the last 8760 hours. HbA1C: No results for input(s): HGBA1C in the last 72 hours. CBG: Recent Labs  Lab 07/09/18 0747 07/09/18 1134 07/09/18 1624 07/09/18 2105 07/10/18 0814  GLUCAP 148* 112* 139* 94 106*   Lipid Profile: No results for input(s): CHOL, HDL, LDLCALC, TRIG, CHOLHDL, LDLDIRECT in the last 72 hours. Thyroid Function Tests: No results for input(s): TSH, T4TOTAL, FREET4, T3FREE, THYROIDAB in the last 72 hours. Anemia Panel: No results for input(s): VITAMINB12, FOLATE, FERRITIN, TIBC, IRON, RETICCTPCT in the last 72 hours. Sepsis Labs: No results for input(s): PROCALCITON, LATICACIDVEN in the last 168 hours.  No results found for this or any previous visit (from the past 240 hour(s)).  Radiology Studies: Dg Chest 1 View  Result Date: 07/10/2018 CLINICAL DATA:  Status post right thoracentesis EXAM: CHEST  1 VIEW COMPARISON:  07/08/2018 FINDINGS: Significant improvement in the right effusion following thoracentesis. Very small right basilar residual hydropneumothorax, suspect related to incomplete re-expansion. Trace left effusion. Stable cardiomegaly with some improvement in the airspace process/edema. Resolving CHF is favored. Difficult to exclude underlying right lower lung pneumonia. Trachea is midline.  Aorta atherosclerotic. IMPRESSION: Improvement in the right pleural effusion following thoracentesis. Very small right basilar residual hydropneumothorax compatible with incomplete lung re-expansion. Improvement in the CHF pattern compared 07/08/2018  Residual more consolidated right lung airspace disease. Electronically Signed   By: Jerilynn Mages.  Shick M.D.   On: 07/10/2018 09:37   US Thoracentesis Asp Pleural Space W/img Guide  Result Date: 07/10/2018 INDICATION: Patient with history of small-cell lung cancer, dyspnea, and right-sided pleural effusion. Request is made for therapeutic right thoracentesis. EXAM: ULTRASOUND GUIDED THERAPEUTIC RIGHT THORACENTESIS MEDICATIONS: 10 mL of 1% lidocaine COMPLICATIONS: None immediate. PROCEDURE: An ultrasound guided thoracentesis was thoroughly discussed with the patient and questions answered. The benefits, risks, alternatives and complications were also discussed. The patient understands and wishes to proceed with the procedure. Written consent was obtained. Ultrasound was performed to localize and mark an adequate pocket of fluid in the right chest. The area was then prepped and draped in the normal sterile fashion. 1% Lidocaine was used for local anesthesia. Under ultrasound guidance a 6 Fr Safe-T-Centesis catheter was introduced. Thoracentesis was performed. The catheter was removed and a dressing applied. FINDINGS: A total of approximately 800 mL of clear yellow fluid was removed. IMPRESSION: Successful ultrasound guided right thoracentesis yielding 800 mL of pleural fluid. Read by: Earley Abide, PA-C Electronically Signed   By: Aletta Edouard M.D.   On: 07/10/2018 09:42        Scheduled Meds: . aspirin EC  81 mg Oral Daily  . atorvastatin  20 mg Oral q1800  . docusate sodium  100 mg Oral BID  . insulin aspart  0-15 Units Subcutaneous TID WC  . insulin aspart  0-5 Units Subcutaneous QHS  . lidocaine (PF)      . metoprolol tartrate  25 mg Oral BID  . midodrine  5 mg Oral Once  . omega-3 acid ethyl esters  2 g Oral Daily  . oxybutynin  5 mg Oral Daily  . pantoprazole  40 mg Oral Daily  . polyethylene glycol  17 g Oral BID  . senna-docusate  1 tablet Oral BID  . sodium chloride flush  3 mL  Intravenous Q12H   Continuous Infusions: . sodium chloride       LOS: 2 days    Time spent: 35 mins.More than 50% of that time was spent in counseling and/or coordination of care.      Shelly Coss, MD [facility] Pager [phone]  If 7PM-7AM, please contact night-coverage [email or web address] Password [facility] 07/10/2018, 10:15 AM

## 2018-07-10 NOTE — Evaluation (Signed)
Physical Therapy Evaluation Patient Details Name: Arthur [name] MRN: [id number] DOB: Sep 23, 1940 Today's Date: 07/10/2018   History of Present Illness  Patient is a 77 year old male with past medical history of advanced small cell carcinoma of the lung, diabetes mellitus, coronary disease, peripheral vascular disease, hypertension, hyperlipidemia who presented with complaints of shortness of breath that significantly worsened recently.  On presentation he was found to be in A. fib with RVR with heart rate of 130s.  Started on Cardizem drip with improvement in the heart rate.  Chest x-ray also showed moderate right-sided pleural effusion.  Clinical Impression   Pt admitted with above diagnosis. Pt currently with functional limitations due to the deficits listed below (see PT Problem List). Managing overall independently, with prn assist from son for rides to chemo appointments, prior to admission; Presents with decr activity tolerance, some unsteadiness with gait; not sure that he will have 24 our assistance at dc; will order OT per protocol for ADLs;  Pt will benefit from skilled PT to increase their independence and safety with mobility to allow discharge to the venue listed below.    Walked the hallway on [employer] and O2 sats remained 90% or greater.     Follow Up Recommendations Home health PT    Equipment Recommendations  Rolling walker with 5" wheels;3in1 (PT)(I believe he already has RW)    Recommendations for Other Services OT consult     Precautions / Restrictions Precautions Precautions: Fall Precaution Comments: Fall risk low, but present; use of RW reduces fall risk further      Mobility  Bed Mobility                  Transfers Overall transfer level: Needs assistance Equipment used: Rolling walker (2 wheeled) Transfers: Sit to/from Stand Sit to Stand: Min guard         General transfer comment: minguard for safety  Ambulation/Gait Ambulation/Gait  assistance: Min guard Gait Distance (Feet): 300 Feet Assistive device: Rolling walker (2 wheeled) Gait Pattern/deviations: Step-through pattern Gait velocity: slowed   General Gait Details: Walked hallway on [employer]; a few standing breaks, less for rest and more to relaease grip on RW for better pulse oximetry reading; good use of RW for steadiness  Stairs            Wheelchair Mobility    Modified Rankin (Stroke Patients Only)       Balance                                             Pertinent Vitals/Pain Pain Assessment: No/denies pain    Home Living Family/patient expects to be discharged to:: Private residence Living Arrangements: Alone Available Help at Discharge: Family;Available PRN/intermittently(son) Type of Home: House Home Access: Stairs to enter   [employer] of Steps: 3 Home Layout: One level Home Equipment: Grab bars - tub/shower      Prior Function Level of Independence: Independent         Comments: Independent in ADL and IADL. Wife is in SNF and pt spends time there daily. Does all grocery shopping and home management.      Hand Dominance   Dominant Hand: Right    Extremity/Trunk Assessment   Upper Extremity Assessment Upper Extremity Assessment: Generalized weakness    Lower Extremity Assessment Lower Extremity Assessment: Generalized weakness  Communication   Communication: HOH  Cognition Arousal/Alertness: Awake/alert Behavior During Therapy: WFL for tasks assessed/performed;Flat affect Overall Cognitive Status: Within Functional Limits for tasks assessed                                        General Comments General comments (skin integrity, edema, etc.): O2 sats ranged 90-100% on room air    Exercises     Assessment/Plan    PT Assessment Patient needs continued PT services  PT Problem List Decreased strength;Decreased activity tolerance;Decreased  balance;Decreased mobility;Decreased knowledge of use of DME;Cardiopulmonary status limiting activity       PT Treatment Interventions DME instruction;Gait training;Stair training;Functional mobility training;Therapeutic activities;Therapeutic exercise;Balance training;Patient/family education    PT Goals (Current goals can be found in the Care Plan section)  Acute Rehab PT Goals Patient Stated Goal: Hopes to be home soon PT Goal Formulation: With patient Time For Goal Achievement: 07/24/18 Potential to Achieve Goals: Good    Frequency Min 3X/week   Barriers to discharge Decreased caregiver support Pt tells me he can have prn assist from son and granddaughter; it doesn't sound like 24 hour assist    Co-evaluation               AM-PAC PT "6 Clicks" Mobility  Outcome Measure Help needed turning from your back to your side while in a flat bed without using bedrails?: None Help needed moving from lying on your back to sitting on the side of a flat bed without using bedrails?: None Help needed moving to and from a bed to a chair (including a wheelchair)?: A Little Help needed standing up from a chair using your arms (e.g., wheelchair or bedside chair)?: A Little Help needed to walk in hospital room?: A Little Help needed climbing 3-5 steps with a railing? : A Little 6 Click Score: 20    End of Session Equipment Utilized During Treatment: Gait belt;Other (comment)(monitor) Activity Tolerance: Patient tolerated treatment well Patient left: in chair;with call bell/phone within reach Nurse Communication: Mobility status PT Visit Diagnosis: Other abnormalities of gait and mobility (R26.89)    Time: 8295-6213 PT Time Calculation (min) (ACUTE ONLY): 28 min   Charges:   PT Evaluation $PT Eval Moderate Complexity: 1 Mod PT Treatments $Gait Training: 8-22 mins        Roney Marion, PT  Acute Rehabilitation Services Pager [phone] Office [place] 07/10/2018, 2:12 PM

## 2018-07-10 NOTE — Procedures (Signed)
PROCEDURE SUMMARY:  Successful image-guided right thoracentesis. Yielded 800 milliliters of clear yellow fluid. Patient tolerated procedure well. No immediate complications.  Specimen was not sent for labs. CXR ordered.  Claris Pong Louk PA-C 07/10/2018 9:39 AM

## 2018-07-11 ENCOUNTER — Ambulatory Visit: Payer: Medicare Other | Admitting: Internal Medicine

## 2018-07-11 LAB — CBC
HCT: 27.8 % — ABNORMAL LOW (ref 39.0–52.0)
Hemoglobin: 8.9 g/dL — ABNORMAL LOW (ref 13.0–17.0)
MCH: 33.8 pg (ref 26.0–34.0)
MCHC: 32 g/dL (ref 30.0–36.0)
MCV: 105.7 fL — ABNORMAL HIGH (ref 80.0–100.0)
PLATELETS: 179 10*3/uL (ref 150–400)
RBC: 2.63 MIL/uL — ABNORMAL LOW (ref 4.22–5.81)
RDW: 19.9 % — ABNORMAL HIGH (ref 11.5–15.5)
WBC: 14.1 10*3/uL — ABNORMAL HIGH (ref 4.0–10.5)
nRBC: 0 % (ref 0.0–0.2)

## 2018-07-11 LAB — BASIC METABOLIC PANEL
Anion gap: 11 (ref 5–15)
BUN: 18 mg/dL (ref 8–23)
CALCIUM: 9.2 mg/dL (ref 8.9–10.3)
CO2: 29 mmol/L (ref 22–32)
Chloride: 97 mmol/L — ABNORMAL LOW (ref 98–111)
Creatinine, Ser: 1.18 mg/dL (ref 0.61–1.24)
GFR calc Af Amer: >60 mL/min (ref >60)
GFR calc non Af Amer: 58 mL/min — ABNORMAL LOW (ref >60)
Glucose, Bld: 128 mg/dL — ABNORMAL HIGH (ref 70–99)
Potassium: 3.7 mmol/L (ref 3.5–5.1)
Sodium: 137 mmol/L (ref 135–145)

## 2018-07-11 LAB — GLUCOSE, CAPILLARY: Glucose-Capillary: 184 mg/dL — ABNORMAL HIGH (ref 70–99)

## 2018-07-11 MED ORDER — FUROSEMIDE 20 MG PO TABS: 20.0000 mg | ORAL_TABLET | Freq: Every day | ORAL | 0 refills | Status: AC

## 2018-07-11 NOTE — Progress Notes (Addendum)
Progress Note  Patient Name: Arthur [name] Date of Encounter: 07/11/2018  Primary Cardiologist: Quay Burow, MD   Subjective   No significant overnight events. Patient's shortness of breath has improved. He states he is able to ambulate and lay flat without any difficulty breathing. No chest pain or palpitations. Patient wants to go home.  Patient underwent ultrasound guided thoracentesis yesterday and 800 mL of clear yellow fluid was removed.   Inpatient Medications    Scheduled Meds: . aspirin EC  81 mg Oral Daily  . atorvastatin  20 mg Oral q1800  . docusate sodium  100 mg Oral BID  . furosemide  20 mg Oral Daily  . insulin aspart  0-15 Units Subcutaneous TID WC  . insulin aspart  0-5 Units Subcutaneous QHS  . metoprolol tartrate  25 mg Oral BID  . midodrine  5 mg Oral Once  . omega-3 acid ethyl esters  2 g Oral Daily  . oxybutynin  5 mg Oral Daily  . pantoprazole  40 mg Oral Daily  . polyethylene glycol  17 g Oral BID  . senna-docusate  1 tablet Oral BID  . sodium chloride flush  3 mL Intravenous Q12H   Continuous Infusions: . sodium chloride     PRN Meds: sodium chloride, acetaminophen, ondansetron (ZOFRAN) IV, sodium chloride flush   Vital Signs    Vitals:   07/10/18 2036 07/10/18 2206 07/11/18 0505 07/11/18 0740  BP: 119/63  125/69 117/68  Pulse: (!) 105 96 93 91  Resp: 19  (!) 29 19  Temp: 98.2 F (36.8 C)  97.6 F (36.4 C) (!) 97.5 F (36.4 C)  TempSrc: Oral   Oral  SpO2: 91%  98% 100%  Weight:   72.3 kg   Height:        Intake/Output Summary (Last 24 hours) at 07/11/2018 0815 Last data filed at 07/10/2018 1700 Gross per 24 hour  Intake 660 ml  Output 1550 ml  Net -890 ml   Filed Weights   07/09/18 0343 07/10/18 0452 07/11/18 0505  Weight: 76 kg 74 kg 72.3 kg    Telemetry    Sinus rhythm with heart rates between the mid 80's and 100's. - Personally Reviewed  ECG    No new ECG tracings today. - Personally Reviewed  Physical  Exam   GEN: 77 year old Caucasian male sitting comfortably in chair. Alert and in no acute distress.   Neck: Supple. Cardiac: RRR. No murmurs, rubs, or gallops.  Respiratory: No increased work of breathing. Crackles noted in right lower lobe. Otherwise, lungs clear.  GI: Abdomen soft, mildly distended, and non-tender. Bowel sounds present. MS: 1+ pitting edema of bilateral lower extremities. Neuro:  No focal deficits. Psych: Normal affect.  Labs    Chemistry Recent Labs  Lab 07/08/18 [phone] 07/09/18 0328 07/10/18 0725 07/11/18 0500  NA 138 138 138 137  K 3.5 3.9 3.9 3.7  CL 105 100 98 97*  CO2 23 26 27 29   GLUCOSE 207* 132* 117* 128*  BUN 14 18 18 18   CREATININE 0.89 1.45* 1.13 1.18  CALCIUM 8.5* 8.5* 9.1 9.2  PROT 6.6  --   --   --   ALBUMIN 3.6  --   --   --   AST 24  --   --   --   ALT 22  --   --   --   ALKPHOS 115  --   --   --   BILITOT 0.6  --   --   --  GFRNONAA >60 45* >60 58*  GFRAA >60 53* >60 >60  ANIONGAP 10 12 13 11      Hematology Recent Labs  Lab 07/09/18 0328 07/10/18 0725 07/11/18 0500  WBC 16.8* 13.6* 14.1*  RBC 2.13* 2.50* 2.63*  HGB 7.4* 8.2* 8.9*  HCT 23.2* 26.8* 27.8*  MCV 108.9* 107.2* 105.7*  MCH 34.7* 32.8 33.8  MCHC 31.9 30.6 32.0  RDW 20.5* 19.9* 19.9*  PLT 99* 133* 179    Cardiac Enzymes Recent Labs  Lab 07/08/18 0614  TROPONINI <0.03   No results for input(s): TROPIPOC in the last 168 hours.   BNP Recent Labs  Lab 07/08/18 0614  BNP 649.0*     DDimer No results for input(s): DDIMER in the last 168 hours.   Radiology    Dg Chest 1 View  Result Date: 07/10/2018 CLINICAL DATA:  Status post right thoracentesis EXAM: CHEST  1 VIEW COMPARISON:  07/08/2018 FINDINGS: Significant improvement in the right effusion following thoracentesis. Very small right basilar residual hydropneumothorax, suspect related to incomplete re-expansion. Trace left effusion. Stable cardiomegaly with some improvement in the airspace  process/edema. Resolving CHF is favored. Difficult to exclude underlying right lower lung pneumonia. Trachea is midline.  Aorta atherosclerotic. IMPRESSION: Improvement in the right pleural effusion following thoracentesis. Very small right basilar residual hydropneumothorax compatible with incomplete lung re-expansion. Improvement in the CHF pattern compared 07/08/2018 Residual more consolidated right lung airspace disease. Electronically Signed   By: Jerilynn Mages.  Shick M.D.   On: 07/10/2018 09:37   US Thoracentesis Asp Pleural Space W/img Guide  Result Date: 07/10/2018 INDICATION: Patient with history of small-cell lung cancer, dyspnea, and right-sided pleural effusion. Request is made for therapeutic right thoracentesis. EXAM: ULTRASOUND GUIDED THERAPEUTIC RIGHT THORACENTESIS MEDICATIONS: 10 mL of 1% lidocaine COMPLICATIONS: None immediate. PROCEDURE: An ultrasound guided thoracentesis was thoroughly discussed with the patient and questions answered. The benefits, risks, alternatives and complications were also discussed. The patient understands and wishes to proceed with the procedure. Written consent was obtained. Ultrasound was performed to localize and mark an adequate pocket of fluid in the right chest. The area was then prepped and draped in the normal sterile fashion. 1% Lidocaine was used for local anesthesia. Under ultrasound guidance a 6 Fr Safe-T-Centesis catheter was introduced. Thoracentesis was performed. The catheter was removed and a dressing applied. FINDINGS: A total of approximately 800 mL of clear yellow fluid was removed. IMPRESSION: Successful ultrasound guided right thoracentesis yielding 800 mL of pleural fluid. Read by: Earley Abide, PA-C Electronically Signed   By: Aletta Edouard M.D.   On: 07/10/2018 09:42    Cardiac Studies   Echocardiogram 03/09/2018: Study Conclusions: - Left ventricle: The cavity size was normal. Systolic function was   moderately reduced. The estimated  ejection fraction was in the   range of 35% to 40%. Diffuse hypokinesis. There is akinesis of   the inferior and inferoseptal myocardium. Doppler parameters are   consistent with abnormal left ventricular relaxation (grade 1   diastolic dysfunction). - Aortic valve: Trileaflet; mildly thickened, mildly calcified   leaflets. - Pericardium, extracardiac: A small pericardial effusion was   identified circumferential to the heart. There was no evidence of   hemodynamic compromise.  Impressions: - EF is decreased when compared to prior.  Patient Profile     Mr. Acuna [name] is a 77 year old male with a history of extensive small cell lung cancer currently on chemotherapy, CAD s/p PCI in 1997, PAD s/p right ileofemoral endarterectomy in 2017, hypertension,  hyperlipidemia, and tobacco abuse who is being seen for acute on chronic CHF and new onset atrial fibrillation.   Assessment & Plan    New Onset Atrial Fibrillation  - Initial EKG on 07/08/2018 showed atrial fibrillation with ventricular rate of 125 bpm. - Patient currently in sinus rhythm with heart rates ranging between the mid 80's and 100's on telemetry. - Currently on Lopressor 25mg  twice daily. Consider increasing back to home dose of 50mg  twice daily prior to discharge. May also consider consolidating to Toprol-XL given reduced EF.  - CHADSVASC at least 4 (CHF, HTN, CAD/PAD, age x2). However, patient has anemia and thrombocytopenia secondary to chemotherapy. He is scheduled for more chemotherapy next week. Therefore, would not send patient home on chronic anticoagulation of this time.   Acute on Chronic CHF - Most recent Echo in 02/2018 showed LVEF of 35-40% with diffuse hypokinesis and grade 1 diastolic dysfunction. - BNP 649.0 on admission. - Documented output of 1.5 L in the past 24 hours and net negative 2.7 L since admission. - Weight down about 13 lbs since admission. - Continue diuresis with PO Lasix. Patient is not on home  Lasix but states he has asked his Oncologist about it before. Will defer home Lasix to MD. - Continue beta-blocker as above. - Consider restarting ACEi/ARB prior to discharge as renal function improves. SCr. 1.18 today (down from 1.45 on 12/21). - Continue monitoring daily weights, strict I/O's, and renal function.  Right Pleural Effusion - Patient underwent ultrasound guided thoracentesis yesterday. Approximately 800 mL of clear yellow fluid was removed.  - Crackles in right lower lobe noted on exam but lungs otherwise clear. - Per primary team.  For questions or updates, please contact [place] Please consult [email or web address] for contact info under        Signed, Darreld Mclean, PA-C  07/11/2018, 8:15 AM    Attending Note:   The patient was seen and examined.  Agree with assessment and plan as noted above.  Changes made to the above note as needed.    The patient left before I was able to see him .    Thayer Headings, Brooke Bonito., MD, [facility] 07/11/2018, 5:34 PM [address],  [place] Pager [phone]

## 2018-07-11 NOTE — Care Management Important Message (Signed)
Important Message  Patient Details  Name: Race Latour MRN: [id number] Date of Birth: 01/24/1941   Medicare Important Message Given:  Yes    Barb Merino [place] 07/11/2018, 3:11 PM

## 2018-07-11 NOTE — Discharge Summary (Signed)
Physician Discharge Summary  Arthur [name] JME:[id number] DOB: 11-13-1940 DOA: 07/08/2018  PCP: Dione Housekeeper, MD  Admit date: 07/08/2018 Discharge date: 07/11/2018  Admitted From: Home Disposition:  Home  Discharge Condition:Stable CODE STATUS:FULL Diet recommendation: Heart Healthy  Brief/Interim Summary:  Patient is a 77 year old male with past medical history of advanced small cell carcinoma of the lung, diabetes mellitus, coronary disease, peripheral vascular disease, hypertension, hyperlipidemia who presented with complaints of shortness of breath that significantly worsened recently.  On presentation he was found to be in A. fib with RVR with heart rate of 130s.  Started on Cardizem drip with improvement in the heart rate.  Chest x-ray also showed moderate right-sided pleural effusion.  [facility] for the management of A. fib with RVR.  Cardiology was following.  He underwent right-sided thoracentesis with removal of 800 mL of clear yellow fluid. He is stable for discharge home today.   Following problems were addressed during his hospitalization:    New onset A. fib with RVR: Currently in normal sinus rhythm.  Heparin drip discontinued. No plan for continuation of anticoagulation therapy.  Had to be put on diltiazem drip on admission which has been held now.CHADS-VASc = 6.  Continue home dose metoprolol.  Currently rate is controlled.  Acute on chronic systolic/diastolic CHF: Echocardiogram done on 03/09/2018 had shown ejection fraction of 35 to 40%, diffuse hypokinesis, grade 1 diastolic dysfunction.  Presented with bilateral lower extremity edema.  Was on Lasix 40 mg IV daily but has to be held because of worsening kidney function.  Started  on Lasix 20 mg daily . He is not on ACE/ARB at home.  I will leave this decision to his primary cardiologist Dr. Gwenlyn Found.  Acute respiratory failure with hypoxia: Secondary to pulmonary edema and pleural  effusion.  Initially had to be put on nonrebreather.  Currently respiration is  stable.  Now on room air.He has moderate amount of right pleural effusion most likely associated with CHF and maintenance.  Underwent US guided right-sided thoracentesis with removal of 800 mL of clear yellow fluid.  Small cell carcinoma of the lung: Advanced with mets to lymph nodes and scapula.  Last chemotherapy on 06/20/2018.  Was on Tecentriq.  He follows with Dr. Julien Nordmann.  Anemia/Thrombocytopenia: Most likely secondary to malignancy, chemotherapy.   We will continue to monitor.  His baseline hemoglobin ranges from 7-8.5.  He has been transfused in the past for anemia.  Acute kidney injury: Creatinine improved after holding IV Lasix..  Diabetes type 2: Continue sliding scale scale.  Last hemoglobin A1C was 7.1 on 03/13/2018  Essential hypertension: Currently blood pressure stable.  Continue beta-blocker.  On amlodipine, Cardura at home.  Leukocytosis: Most likely stress related.Improving.  No signs of infection.  Cultures negative.  Hyperlipidemia: Continue statin  History of coronary artery disease: Currently denies any chest pain.  On Plavix at home which is on hold because of IV heparin.Will resume on DC.  Continue Imdur.  EKG not show any ischemic changes.  Deconditioning/debility: Reports of generalized weakness.  PT recommended home health physical therapy.   Discharge Diagnoses:  Active Problems:   Essential hypertension   PAD (peripheral artery disease) (HCC)   Small cell lung cancer, right (HCC)   Acute on chronic combined systolic and diastolic congestive heart failure (HCC)   Acute respiratory failure with hypoxia (HCC)   Atrial fibrillation with RVR [facility])    Discharge Instructions  Discharge Instructions    Diet -  low sodium heart healthy   Complete by:  As directed    Discharge instructions   Complete by:  As directed    1) Please follow-up with your PCP in a week.  Do a  BMP test during the follow-up. 2)Follow up with your cardiologist and oncologist. 3)Take prescribed medications as instructed.   Increase activity slowly   Complete by:  As directed      Allergies as of 07/11/2018      Reactions   Codeine Swelling      Medication List    TAKE these medications   amLODipine 5 MG tablet Commonly known as:  NORVASC Take 1 tablet (5 mg total) by mouth daily.   aspirin 81 MG EC tablet Take 1 tablet (81 mg total) by mouth daily.   atorvastatin 20 MG tablet Commonly known as:  LIPITOR TAKE 1 TABLET (20 MG TOTAL) BY MOUTH DAILY. What changed:  See the new instructions.   clopidogrel 75 MG tablet Commonly known as:  PLAVIX Take 1 tablet (75 mg total) by mouth daily.   docusate sodium 100 MG capsule Commonly known as:  COLACE Take 100 mg by mouth 2 (two) times daily.   doxazosin 1 MG tablet Commonly known as:  CARDURA Take 3 mg by mouth at bedtime. Pt takes 3 at bedtime   EX-LAX PO Take 2 tablets by mouth daily as needed (constipation).   feeding supplement (GLUCERNA 1.2 CAL) Liqd Take as directed by physician   fish oil-omega-3 fatty acids 1000 MG capsule Take 2 g by mouth daily.   furosemide 20 MG tablet Commonly known as:  LASIX Take 1 tablet (20 mg total) by mouth daily. Start taking on:  July 12, 2018   insulin lispro 100 UNIT/ML injection Commonly known as:  HUMALOG Inject 0.35 mLs (35 Units total) into the skin 2 (two) times daily. What changed:    how much to take  when to take this   isosorbide mononitrate 30 MG 24 hr tablet Commonly known as:  IMDUR Take 1 tablet (30 mg total) by mouth daily.   meloxicam 7.5 MG tablet Commonly known as:  MOBIC Take 7.5 mg by mouth daily.   metoprolol tartrate 50 MG tablet Commonly known as:  LOPRESSOR Take 1 tablet (50 mg total) by mouth 2 (two) times daily.   Misc. Devices Misc 1 wheelchair for mobility.   nitroGLYCERIN 0.4 MG SL tablet Commonly known as:   NITROSTAT Place 1 tablet (0.4 mg total) under the tongue every 5 (five) minutes x 3 doses as needed for chest pain.   ONETOUCH VERIO test strip Generic drug:  glucose blood CHECK BLOOD SUGARS 3 DAILY:DX 250.40 One touch verio   oxybutynin 5 MG tablet Commonly known as:  DITROPAN Take 5 mg by mouth daily.   pantoprazole 40 MG tablet Commonly known as:  PROTONIX Take 1 tablet (40 mg total) by mouth daily.   polyethylene glycol packet Commonly known as:  MIRALAX / GLYCOLAX Take 17 g by mouth 2 (two) times daily.   prochlorperazine 10 MG tablet Commonly known as:  COMPAZINE Take 1 tablet (10 mg total) by mouth every 6 (six) hours as needed for nausea or vomiting.   senna-docusate 8.6-50 MG tablet Commonly known as:  Senokot-S Take 1 tablet by mouth 2 (two) times daily. While taking strong pain meds to prevent constipation   traMADol 50 MG tablet Commonly known as:  ULTRAM Take 1-2 tablets (50-100 mg total) by mouth every 6 (six) hours as  needed for moderate pain or severe pain. Post-operatively      Follow-up Information    Dione Housekeeper, MD. Schedule an appointment as soon as possible for a visit in 1 week(s).   Specialty:  Family Medicine Contact information: [place] [address] [phone]        Lorretta Harp, MD. Schedule an appointment as soon as possible for a visit in 2 week(s).   Specialties:  Cardiology, Radiology Contact information: [address] [place] [address] [phone]          Allergies  Allergen Reactions  . Codeine Swelling    Consultations: Cardiology  Procedures/Studies: Dg Chest 1 View  Result Date: 07/10/2018 CLINICAL DATA:  Status post right thoracentesis EXAM: CHEST  1 VIEW COMPARISON:  07/08/2018 FINDINGS: Significant improvement in the right effusion following thoracentesis. Very small right basilar residual hydropneumothorax, suspect related to incomplete re-expansion. Trace  left effusion. Stable cardiomegaly with some improvement in the airspace process/edema. Resolving CHF is favored. Difficult to exclude underlying right lower lung pneumonia. Trachea is midline.  Aorta atherosclerotic. IMPRESSION: Improvement in the right pleural effusion following thoracentesis. Very small right basilar residual hydropneumothorax compatible with incomplete lung re-expansion. Improvement in the CHF pattern compared 07/08/2018 Residual more consolidated right lung airspace disease. Electronically Signed   By: Jerilynn Mages.  Shick M.D.   On: 07/10/2018 09:37   Ct Chest W Contrast  Result Date: 06/14/2018 CLINICAL DATA:  Small-cell lung cancer. EXAM: CT CHEST, ABDOMEN, AND PELVIS WITH CONTRAST TECHNIQUE: Multidetector CT imaging of the chest, abdomen and pelvis was performed following the standard protocol during bolus administration of intravenous contrast. CONTRAST:  182mL OMNIPAQUE IOHEXOL 300 MG/ML  SOLN COMPARISON:  05/06/2018 FINDINGS: CT CHEST FINDINGS Cardiovascular: Heart is enlarged. Small pericardial effusion is stable. Coronary artery calcification is evident. Atherosclerotic calcification is noted in the wall of the thoracic aorta. Mediastinum/Nodes: No supraclavicular lymphadenopathy. The index low right jugular lymph node measured previously has resolved in the interval. Index right paratracheal node measured previously at 1.4 cm is 1.2 cm today. Right hilar lymph node measured previously at 1.6 cm short axis is 1.4 cm today (29/2). Anterior mediastinal lesion measured previously at 1.2 cm short axis is 0.9 cm today. Similar appearance bilateral thyroid nodules. The esophagus has normal imaging features. Lungs/Pleura: The central tracheobronchial airways are patent. Areas of collapse/consolidation in the right middle lobe and right lower lobe are progressive in the interval. Right pleural effusion has progressed since prior study and is now small to moderate in size. Trace left pleural effusion  evident. Musculoskeletal: Similar appearance of nonacute right scapula fracture. Stable sclerosis at the T3 vertebral body with similar appearance of heterogeneous mineralization in T9 and T10. CT ABDOMEN PELVIS FINDINGS Hepatobiliary: No focal abnormality within the liver parenchyma. Calcified gallstones again noted with gallbladder wall thickening. No intrahepatic or extrahepatic biliary dilation. Pancreas: Diffuse atrophy of pancreatic parenchyma without main ductal dilatation. Spleen: Calcified granulomata. Adrenals/Urinary Tract: No adrenal nodule or mass. Kidneys unremarkable. No evidence for hydroureter. The urinary bladder appears normal for the degree of distention. Stomach/Bowel: Stomach is nondistended. No gastric wall thickening. No evidence of outlet obstruction. Duodenum is normally positioned as is the ligament of Treitz. No small bowel wall thickening. No small bowel dilatation. The terminal ileum is normal. The appendix is not visualized, but there is no edema or inflammation in the region of the cecum. No gross colonic mass. No colonic wall thickening. Diverticuli are seen scattered along the entire length of  the colon without CT findings of diverticulitis. Vascular/Lymphatic: There is abdominal aortic atherosclerosis without aneurysm. There is no gastrohepatic or hepatoduodenal ligament lymphadenopathy. No intraperitoneal or retroperitoneal lymphadenopathy. No pelvic sidewall lymphadenopathy. Reproductive: Prostate gland is enlarged with marked mass-effect on the bladder base. Other: No intraperitoneal free fluid. Musculoskeletal: Similar appearance of sclerosis posterior right iliac bone and left iliac crest. Diffuse sclerosis in the L1 vertebral body is progressed. IMPRESSION: 1. Interval decrease and mediastinal and right hilar lymphadenopathy. 2. Interval progression of right middle and lower lobe collapse/consolidation with progression of the right pleural effusion, now small to moderate in  size. 3. Similar appearance of heterogeneous mineralization and sclerosis in the T3, T9, and T10 vertebral bodies. The L1 level shows progression diffuse sclerosis with some progression of vertebral body collapse. 4. Cholelithiasis with irregular gallbladder wall thickening. Cholecystitis not excluded. 5. Stable appearance of adrenal glands and kidneys with no findings to suggest recurrent disease in these regions. 6.  Aortic Atherosclerois (ICD10-170.0) Electronically Signed   By: Misty Stanley M.D.   On: 06/14/2018 12:45   Ct Abdomen Pelvis W Contrast  Result Date: 06/14/2018 CLINICAL DATA:  Small-cell lung cancer. EXAM: CT CHEST, ABDOMEN, AND PELVIS WITH CONTRAST TECHNIQUE: Multidetector CT imaging of the chest, abdomen and pelvis was performed following the standard protocol during bolus administration of intravenous contrast. CONTRAST:  161mL OMNIPAQUE IOHEXOL 300 MG/ML  SOLN COMPARISON:  05/06/2018 FINDINGS: CT CHEST FINDINGS Cardiovascular: Heart is enlarged. Small pericardial effusion is stable. Coronary artery calcification is evident. Atherosclerotic calcification is noted in the wall of the thoracic aorta. Mediastinum/Nodes: No supraclavicular lymphadenopathy. The index low right jugular lymph node measured previously has resolved in the interval. Index right paratracheal node measured previously at 1.4 cm is 1.2 cm today. Right hilar lymph node measured previously at 1.6 cm short axis is 1.4 cm today (29/2). Anterior mediastinal lesion measured previously at 1.2 cm short axis is 0.9 cm today. Similar appearance bilateral thyroid nodules. The esophagus has normal imaging features. Lungs/Pleura: The central tracheobronchial airways are patent. Areas of collapse/consolidation in the right middle lobe and right lower lobe are progressive in the interval. Right pleural effusion has progressed since prior study and is now small to moderate in size. Trace left pleural effusion evident. Musculoskeletal:  Similar appearance of nonacute right scapula fracture. Stable sclerosis at the T3 vertebral body with similar appearance of heterogeneous mineralization in T9 and T10. CT ABDOMEN PELVIS FINDINGS Hepatobiliary: No focal abnormality within the liver parenchyma. Calcified gallstones again noted with gallbladder wall thickening. No intrahepatic or extrahepatic biliary dilation. Pancreas: Diffuse atrophy of pancreatic parenchyma without main ductal dilatation. Spleen: Calcified granulomata. Adrenals/Urinary Tract: No adrenal nodule or mass. Kidneys unremarkable. No evidence for hydroureter. The urinary bladder appears normal for the degree of distention. Stomach/Bowel: Stomach is nondistended. No gastric wall thickening. No evidence of outlet obstruction. Duodenum is normally positioned as is the ligament of Treitz. No small bowel wall thickening. No small bowel dilatation. The terminal ileum is normal. The appendix is not visualized, but there is no edema or inflammation in the region of the cecum. No gross colonic mass. No colonic wall thickening. Diverticuli are seen scattered along the entire length of the colon without CT findings of diverticulitis. Vascular/Lymphatic: There is abdominal aortic atherosclerosis without aneurysm. There is no gastrohepatic or hepatoduodenal ligament lymphadenopathy. No intraperitoneal or retroperitoneal lymphadenopathy. No pelvic sidewall lymphadenopathy. Reproductive: Prostate gland is enlarged with marked mass-effect on the bladder base. Other: No intraperitoneal free fluid. Musculoskeletal: Similar appearance of  sclerosis posterior right iliac bone and left iliac crest. Diffuse sclerosis in the L1 vertebral body is progressed. IMPRESSION: 1. Interval decrease and mediastinal and right hilar lymphadenopathy. 2. Interval progression of right middle and lower lobe collapse/consolidation with progression of the right pleural effusion, now small to moderate in size. 3. Similar appearance  of heterogeneous mineralization and sclerosis in the T3, T9, and T10 vertebral bodies. The L1 level shows progression diffuse sclerosis with some progression of vertebral body collapse. 4. Cholelithiasis with irregular gallbladder wall thickening. Cholecystitis not excluded. 5. Stable appearance of adrenal glands and kidneys with no findings to suggest recurrent disease in these regions. 6.  Aortic Atherosclerois (ICD10-170.0) Electronically Signed   By: Misty Stanley M.D.   On: 06/14/2018 12:45   Dg Chest Port 1 View  Result Date: 07/08/2018 CLINICAL DATA:  Shortness of breath EXAM: PORTABLE CHEST 1 VIEW COMPARISON:  Chest CT 06/14/2018 FINDINGS: Medium-sized right pleural effusion with associated atelectasis. Aeration of the right lung is slightly worse than on the CT of 06/14/2018. There is moderate cardiomegaly. Left lung is well aerated without focal consolidation or pulmonary edema. IMPRESSION: Medium-sized right pleural effusion with associated atelectasis. Electronically Signed   By: Ulyses Jarred M.D.   On: 07/08/2018 06:00   US Thoracentesis Asp Pleural Space W/img Guide  Result Date: 07/10/2018 INDICATION: Patient with history of small-cell lung cancer, dyspnea, and right-sided pleural effusion. Request is made for therapeutic right thoracentesis. EXAM: ULTRASOUND GUIDED THERAPEUTIC RIGHT THORACENTESIS MEDICATIONS: 10 mL of 1% lidocaine COMPLICATIONS: None immediate. PROCEDURE: An ultrasound guided thoracentesis was thoroughly discussed with the patient and questions answered. The benefits, risks, alternatives and complications were also discussed. The patient understands and wishes to proceed with the procedure. Written consent was obtained. Ultrasound was performed to localize and mark an adequate pocket of fluid in the right chest. The area was then prepped and draped in the normal sterile fashion. 1% Lidocaine was used for local anesthesia. Under ultrasound guidance a 6 Fr Safe-T-Centesis  catheter was introduced. Thoracentesis was performed. The catheter was removed and a dressing applied. FINDINGS: A total of approximately 800 mL of clear yellow fluid was removed. IMPRESSION: Successful ultrasound guided right thoracentesis yielding 800 mL of pleural fluid. Read by: Earley Abide, PA-C Electronically Signed   By: Aletta Edouard M.D.   On: 07/10/2018 09:42       Subjective: Patient seen and examined the bedside this morning.  Remains comfortable.  Current respiratory status stable.  Heart rate is well controlled.  Denies any specific complaints.  Stable  for discharge today.  Discharge Exam: Vitals:   07/11/18 0505 07/11/18 0740  BP: 125/69 117/68  Pulse: 93 91  Resp: (!) 29 19  Temp: 97.6 F (36.4 C) (!) 97.5 F (36.4 C)  SpO2: 98% 100%   Vitals:   07/10/18 2036 07/10/18 2206 07/11/18 0505 07/11/18 0740  BP: 119/63  125/69 117/68  Pulse: (!) 105 96 93 91  Resp: 19  (!) 29 19  Temp: 98.2 F (36.8 C)  97.6 F (36.4 C) (!) 97.5 F (36.4 C)  TempSrc: Oral   Oral  SpO2: 91%  98% 100%  Weight:   72.3 kg   Height:        General: Pt is alert, awake, not in acute distress Cardiovascular: RRR, S1/S2 +, no rubs, no gallops Respiratory: CTA bilaterally, no wheezing, no rhonchi Abdominal: Soft, NT, ND, bowel sounds + Extremities: no edema, no cyanosis    The results of significant diagnostics  from this hospitalization (including imaging, microbiology, ancillary and laboratory) are listed below for reference.     Microbiology: No results found for this or any previous visit (from the past 240 hour(s)).   Labs: BNP (last 3 results) Recent Labs    03/08/18 1214 07/08/18 0614  BNP 734.0* 035.4*   Basic Metabolic Panel: Recent Labs  Lab 07/08/18 0614 07/09/18 0328 07/10/18 0725 07/11/18 0500  NA 138 138 138 137  K 3.5 3.9 3.9 3.7  CL 105 100 98 97*  CO2 23 26 27 29   GLUCOSE 207* 132* 117* 128*  BUN 14 18 18 18   CREATININE 0.89 1.45* 1.13 1.18   CALCIUM 8.5* 8.5* 9.1 9.2   Liver Function Tests: Recent Labs  Lab 07/08/18 0614  AST 24  ALT 22  ALKPHOS 115  BILITOT 0.6  PROT 6.6  ALBUMIN 3.6   No results for input(s): LIPASE, AMYLASE in the last 168 hours. No results for input(s): AMMONIA in the last 168 hours. CBC: Recent Labs  Lab 07/08/18 0614 07/09/18 0328 07/10/18 0725 07/11/18 0500  WBC 19.3* 16.8* 13.6* 14.1*  NEUTROABS 16.0* 13.6*  --   --   HGB 7.8* 7.4* 8.2* 8.9*  HCT 25.5* 23.2* 26.8* 27.8*  MCV 109.4* 108.9* 107.2* 105.7*  PLT 73* 99* 133* 179   Cardiac Enzymes: Recent Labs  Lab 07/08/18 0614  TROPONINI <0.03   BNP: Invalid input(s): POCBNP CBG: Recent Labs  Lab 07/10/18 0814 07/10/18 1145 07/10/18 1622 07/10/18 2135 07/11/18 0736  GLUCAP 106* 154* 83 189* 184*   D-Dimer No results for input(s): DDIMER in the last 72 hours. Hgb A1c No results for input(s): HGBA1C in the last 72 hours. Lipid Profile No results for input(s): CHOL, HDL, LDLCALC, TRIG, CHOLHDL, LDLDIRECT in the last 72 hours. Thyroid function studies No results for input(s): TSH, T4TOTAL, T3FREE, THYROIDAB in the last 72 hours.  Invalid input(s): FREET3 Anemia work up No results for input(s): VITAMINB12, FOLATE, FERRITIN, TIBC, IRON, RETICCTPCT in the last 72 hours. Urinalysis    Component Value Date/Time   COLORURINE YELLOW 02/25/2018 1157   APPEARANCEUR CLEAR 02/25/2018 1157   LABSPEC 1.016 02/25/2018 1157   PHURINE 6.0 02/25/2018 1157   GLUCOSEU 50 (A) 02/25/2018 1157   HGBUR NEGATIVE 02/25/2018 1157   BILIRUBINUR NEGATIVE 02/25/2018 1157   KETONESUR NEGATIVE 02/25/2018 1157   PROTEINUR NEGATIVE 02/25/2018 1157   NITRITE NEGATIVE 02/25/2018 1157   LEUKOCYTESUR NEGATIVE 02/25/2018 1157   Sepsis Labs Invalid input(s): PROCALCITONIN,  WBC,  LACTICIDVEN Microbiology No results found for this or any previous visit (from the past 240 hour(s)).  Please note: You were cared for by a hospitalist during your  hospital stay. Once you are discharged, your primary care physician will handle any further medical issues. Please note that NO REFILLS for any discharge medications will be authorized once you are discharged, as it is imperative that you return to your primary care physician (or establish a relationship with a primary care physician if you do not have one) for your post hospital discharge needs so that they can reassess your need for medications and monitor your lab values.    Time coordinating discharge: 40 minutes  SIGNED:   Shelly Coss, MD  [facility] 07/11/2018, 10:30 AM Pager [phone]  If 7PM-7AM, please contact night-coverage [email or web address] Password [email or web address]

## 2018-07-11 NOTE — Care Management Note (Signed)
Case Management Note  Patient Details  Name: Arthur [name] MRN: [id number] Date of Birth: [birth year]  Subjective/Objective:  Pt presented for Acute on Chronic Heart Failure. PTA from home alone- not homebound. Plan will be to transition home 07-11-18. Pt has support of daughter.                 Action/Plan: CM did speak with patient to offer choice for [place]- pt declined services 2/2 not being homebound. CM did educate patient in regards to he can call PCP if needs HH in the future. Staff RN to provide patient with 30 day free card for Eliquis. CM did offer choice for DME and patient has DME RW in the home-declined 3n1. No further needs from CM at this time.    Expected Discharge Date:  07/11/18               Expected Discharge Plan:  [place]  In-House Referral:  NA  Discharge planning Services  CM Consult  Post Acute Care Choice:  NA Choice offered to:  Patient(Declined Services-)  DME Arranged:  N/A(Declined DME) DME Agency:  NA  HH Arranged:  Patient Refused [place] Agency:  NA  Status of Service:  Completed, signed off  If discussed at [place] of Stay Meetings, dates discussed:    Additional Comments:  Bethena Roys, RN 07/11/2018, 10:47 AM

## 2018-07-14 ENCOUNTER — Inpatient Hospital Stay: Payer: Medicare Other

## 2018-07-14 ENCOUNTER — Other Ambulatory Visit: Payer: Self-pay | Admitting: Internal Medicine

## 2018-07-14 ENCOUNTER — Inpatient Hospital Stay (HOSPITAL_BASED_OUTPATIENT_CLINIC_OR_DEPARTMENT_OTHER): Payer: Medicare Other | Admitting: Medical

## 2018-07-14 ENCOUNTER — Telehealth: Payer: Self-pay | Admitting: Internal Medicine

## 2018-07-14 ENCOUNTER — Inpatient Hospital Stay (HOSPITAL_BASED_OUTPATIENT_CLINIC_OR_DEPARTMENT_OTHER): Payer: Medicare Other | Admitting: Internal Medicine

## 2018-07-14 ENCOUNTER — Encounter: Payer: Self-pay | Admitting: Internal Medicine

## 2018-07-14 VITALS — BP 115/63 | HR 101 | Temp 97.7°F | Resp 18 | Ht 69.0 in | Wt 163.8 lb

## 2018-07-14 VITALS — BP 138/73 | HR 97 | Temp 97.7°F | Resp 16

## 2018-07-14 DIAGNOSIS — C3491 Malignant neoplasm of unspecified part of right bronchus or lung: Secondary | ICD-10-CM

## 2018-07-14 DIAGNOSIS — E1151 Type 2 diabetes mellitus with diabetic peripheral angiopathy without gangrene: Secondary | ICD-10-CM

## 2018-07-14 DIAGNOSIS — K219 Gastro-esophageal reflux disease without esophagitis: Secondary | ICD-10-CM

## 2018-07-14 DIAGNOSIS — Z7689 Persons encountering health services in other specified circumstances: Secondary | ICD-10-CM

## 2018-07-14 DIAGNOSIS — C7951 Secondary malignant neoplasm of bone: Secondary | ICD-10-CM | POA: Diagnosis not present

## 2018-07-14 DIAGNOSIS — I129 Hypertensive chronic kidney disease with stage 1 through stage 4 chronic kidney disease, or unspecified chronic kidney disease: Secondary | ICD-10-CM

## 2018-07-14 DIAGNOSIS — T451X5A Adverse effect of antineoplastic and immunosuppressive drugs, initial encounter: Secondary | ICD-10-CM

## 2018-07-14 DIAGNOSIS — I4891 Unspecified atrial fibrillation: Secondary | ICD-10-CM | POA: Diagnosis not present

## 2018-07-14 DIAGNOSIS — E785 Hyperlipidemia, unspecified: Secondary | ICD-10-CM

## 2018-07-14 DIAGNOSIS — Z79899 Other long term (current) drug therapy: Secondary | ICD-10-CM | POA: Diagnosis not present

## 2018-07-14 DIAGNOSIS — I2510 Atherosclerotic heart disease of native coronary artery without angina pectoris: Secondary | ICD-10-CM | POA: Diagnosis not present

## 2018-07-14 DIAGNOSIS — Z9221 Personal history of antineoplastic chemotherapy: Secondary | ICD-10-CM

## 2018-07-14 DIAGNOSIS — I252 Old myocardial infarction: Secondary | ICD-10-CM

## 2018-07-14 DIAGNOSIS — I700 Atherosclerosis of aorta: Secondary | ICD-10-CM

## 2018-07-14 DIAGNOSIS — Z5112 Encounter for antineoplastic immunotherapy: Secondary | ICD-10-CM

## 2018-07-14 DIAGNOSIS — E042 Nontoxic multinodular goiter: Secondary | ICD-10-CM | POA: Diagnosis not present

## 2018-07-14 DIAGNOSIS — C7972 Secondary malignant neoplasm of left adrenal gland: Secondary | ICD-10-CM

## 2018-07-14 DIAGNOSIS — Z87442 Personal history of urinary calculi: Secondary | ICD-10-CM

## 2018-07-14 DIAGNOSIS — I714 Abdominal aortic aneurysm, without rupture: Secondary | ICD-10-CM

## 2018-07-14 DIAGNOSIS — I313 Pericardial effusion (noninflammatory): Secondary | ICD-10-CM

## 2018-07-14 DIAGNOSIS — D6481 Anemia due to antineoplastic chemotherapy: Secondary | ICD-10-CM | POA: Diagnosis not present

## 2018-07-14 DIAGNOSIS — Z5111 Encounter for antineoplastic chemotherapy: Secondary | ICD-10-CM

## 2018-07-14 DIAGNOSIS — T80818A Extravasation of other vesicant agent, initial encounter: Secondary | ICD-10-CM

## 2018-07-14 DIAGNOSIS — N189 Chronic kidney disease, unspecified: Secondary | ICD-10-CM

## 2018-07-14 DIAGNOSIS — M129 Arthropathy, unspecified: Secondary | ICD-10-CM | POA: Diagnosis not present

## 2018-07-14 DIAGNOSIS — Z7982 Long term (current) use of aspirin: Secondary | ICD-10-CM

## 2018-07-14 DIAGNOSIS — C3481 Malignant neoplasm of overlapping sites of right bronchus and lung: Secondary | ICD-10-CM

## 2018-07-14 LAB — CBC WITH DIFFERENTIAL (CANCER CENTER ONLY)
Abs Immature Granulocytes: 0.04 10*3/uL (ref 0.00–0.07)
BASOS ABS: 0.1 10*3/uL (ref 0.0–0.1)
Basophils Relative: 1 %
Eosinophils Absolute: 0 10*3/uL (ref 0.0–0.5)
Eosinophils Relative: 0 %
HCT: 26.3 % — ABNORMAL LOW (ref 39.0–52.0)
Hemoglobin: 8.3 g/dL — ABNORMAL LOW (ref 13.0–17.0)
IMMATURE GRANULOCYTES: 0 %
LYMPHS ABS: 0.7 10*3/uL (ref 0.7–4.0)
Lymphocytes Relative: 6 %
MCH: 33.9 pg (ref 26.0–34.0)
MCHC: 31.6 g/dL (ref 30.0–36.0)
MCV: 107.3 fL — ABNORMAL HIGH (ref 80.0–100.0)
Monocytes Absolute: 1.1 10*3/uL — ABNORMAL HIGH (ref 0.1–1.0)
Monocytes Relative: 11 %
NRBC: 0 % (ref 0.0–0.2)
Neutro Abs: 8.6 10*3/uL — ABNORMAL HIGH (ref 1.7–7.7)
Neutrophils Relative %: 82 %
Platelet Count: 189 10*3/uL (ref 150–400)
RBC: 2.45 MIL/uL — ABNORMAL LOW (ref 4.22–5.81)
RDW: 18.5 % — ABNORMAL HIGH (ref 11.5–15.5)
WBC Count: 10.5 10*3/uL (ref 4.0–10.5)

## 2018-07-14 LAB — CMP (CANCER CENTER ONLY)
ALT: 14 U/L (ref 0–44)
AST: 14 U/L — AB (ref 15–41)
Albumin: 3.5 g/dL (ref 3.5–5.0)
Alkaline Phosphatase: 104 U/L (ref 38–126)
Anion gap: 9 (ref 5–15)
BUN: 19 mg/dL (ref 8–23)
CO2: 29 mmol/L (ref 22–32)
Calcium: 9.5 mg/dL (ref 8.9–10.3)
Chloride: 102 mmol/L (ref 98–111)
Creatinine: 1.24 mg/dL (ref 0.61–1.24)
GFR, Est AFR Am: >60 mL/min (ref >60)
GFR, Estimated: 55 mL/min — ABNORMAL LOW (ref >60)
GLUCOSE: 190 mg/dL — AB (ref 70–99)
Potassium: 4.6 mmol/L (ref 3.5–5.1)
Sodium: 140 mmol/L (ref 135–145)
TOTAL PROTEIN: 6.9 g/dL (ref 6.5–8.1)
Total Bilirubin: 0.5 mg/dL (ref 0.3–1.2)

## 2018-07-14 MED ORDER — PALONOSETRON HCL INJECTION 0.25 MG/5ML
INTRAVENOUS | Status: AC
  Filled 2018-07-14: qty 5

## 2018-07-14 MED ORDER — SODIUM CHLORIDE 0.9 % IV SOLN
100.0000 mg/m2 | Freq: Once | INTRAVENOUS | Status: AC
  Administered 2018-07-14: 190 mg via INTRAVENOUS
  Filled 2018-07-14: qty 9.5

## 2018-07-14 MED ORDER — SODIUM CHLORIDE 0.9% FLUSH
10.0000 mL | INTRAVENOUS | Status: AC | PRN
  Filled 2018-07-14: qty 10

## 2018-07-14 MED ORDER — SODIUM CHLORIDE 0.9 % IV SOLN
Freq: Once | INTRAVENOUS | Status: AC
  Administered 2018-07-14: 10:00:00 via INTRAVENOUS
  Filled 2018-07-14: qty 5

## 2018-07-14 MED ORDER — SODIUM CHLORIDE 0.9 % IV SOLN
1200.0000 mg | Freq: Once | INTRAVENOUS | Status: AC
  Administered 2018-07-14: 1200 mg via INTRAVENOUS
  Filled 2018-07-14: qty 20

## 2018-07-14 MED ORDER — PALONOSETRON HCL INJECTION 0.25 MG/5ML
0.2500 mg | Freq: Once | INTRAVENOUS | Status: AC
  Administered 2018-07-14: 0.25 mg via INTRAVENOUS

## 2018-07-14 MED ORDER — SODIUM CHLORIDE 0.9 % IV SOLN
Freq: Once | INTRAVENOUS | Status: AC
  Administered 2018-07-14: 10:00:00 via INTRAVENOUS
  Filled 2018-07-14: qty 250

## 2018-07-14 MED ORDER — SODIUM CHLORIDE 0.9 % IV SOLN
382.0000 mg | Freq: Once | INTRAVENOUS | Status: AC
  Administered 2018-07-14: 380 mg via INTRAVENOUS
  Filled 2018-07-14: qty 38

## 2018-07-14 NOTE — Progress Notes (Signed)
1155- Patient complain of arm discomfort at IV site. Site is swollen and sensitive. Patient just finished Tecentriq infusion and was flushing with normal saline when he alerted nurse. Normal saline stopped and Sandi Mealy, PA notified. Sandi Mealy, PA over to assess patient and per [facility] no infiltration protocol required. IV removed and heat pack applied. New IV started in right [facility].

## 2018-07-14 NOTE — Progress Notes (Signed)
[place] [phone]   [phone]  OFFICE PROGRESS NOTE  Dione Housekeeper, MD [place] [address]  DIAGNOSIS: Extensive stage (T2b, N3, M1c) small cell lung cancer, presented with large right upper lobe lung mass with extension to the right hilum in addition to the external and supraclavicular lymphadenopathy as well as metastatic disease to the retroperitoneal lymph nodes and left adrenal gland and suspicious right scapular bone lesion diagnosed in August 2019  PRIOR THERAPY: None.  CURRENT THERAPY: Systemic chemotherapy with carboplatin for AUC of 5 on day 1, etoposide 100 mg/M2 on days 1, 2 and 3 as well as Tecentriq 1200 mg IV every 3 weeks with Neulasta support.  First dose 03/28/2018.  Status post 5 cycles.  INTERVAL HISTORY: Arthur [name] 77 y.o. male returns to the clinic today for follow-up visit accompanied by his granddaughter.  The patient is feeling fine today with no concerning complaints except for fatigue.  He was seen in the hospital recently for shortness of breath and atrial fibrillation.  He has ultrasound-guided thoracentesis with drainage of 900 cc of pleural fluid.  He felt much better after the procedure.  The patient denied having any current chest pain, cough or hemoptysis.  He denied having any recent nausea, vomiting, diarrhea or constipation.  He lost several pounds since his last visit.  He is here today for evaluation before starting cycle #6 of his treatment.  MEDICAL HISTORY: Past Medical History:  Diagnosis Date  . Arthritis   . Ascending aortic aneurysm (HCC)    PER CT CHEST 03-08-18 IN [facility] , 4.0CM  . Chronic kidney disease   . Claudication ([place])   . Coronary artery disease 1997   CFX PCI, '97. Mod residual RI and RCA dis.  . Diabetes mellitus ([place])    IDDM  . Dyslipidemia   . Dyspnea   . GERD (gastroesophageal reflux disease)   . Hard of hearing   . History of bronchitis   . History of  chemotherapy   . History of kidney stones 1958  . HTN (hypertension)   . Lacunar stroke ([place])   . Metastatic lung cancer (metastasis from lung to other site), unspecified laterality ([place])    DIAGNOSED 02-2018, MANAGED BY DR Bowmore  . NSTEMI (non-ST elevated myocardial infarction) ([place])   . PAD (peripheral artery disease) ([place])   . Pneumonia   . Tobacco abuse   . Urinary incontinence   . Wears glasses     ALLERGIES:  is allergic to codeine.  MEDICATIONS:  Current Outpatient Medications  Medication Sig Dispense Refill  . amLODipine (NORVASC) 5 MG tablet Take 1 tablet (5 mg total) by mouth daily. 180 tablet 3  . aspirin EC 81 MG EC tablet Take 1 tablet (81 mg total) by mouth daily. 30 tablet 1  . atorvastatin (LIPITOR) 20 MG tablet TAKE 1 TABLET (20 MG TOTAL) BY MOUTH DAILY. (Patient taking differently: Take 20 mg by mouth daily at 6 PM. ) 90 tablet 2  . clopidogrel (PLAVIX) 75 MG tablet Take 1 tablet (75 mg total) by mouth daily. 30 tablet 1  . docusate sodium (COLACE) 100 MG capsule Take 100 mg by mouth 2 (two) times daily.    Marland Kitchen doxazosin (CARDURA) 1 MG tablet Take 3 mg by mouth at bedtime. Pt takes 3 at bedtime     . fish oil-omega-3 fatty acids 1000 MG capsule Take 2 g by mouth daily.     Marland Kitchen  furosemide (LASIX) 20 MG tablet Take 1 tablet (20 mg total) by mouth daily. 30 tablet 0  . glucose blood (ONETOUCH VERIO) test strip CHECK BLOOD SUGARS 3 DAILY:DX 250.40 One touch verio    . insulin lispro (HUMALOG) 100 UNIT/ML injection Inject 0.35 mLs (35 Units total) into the skin 2 (two) times daily. (Patient taking differently: Inject 25-40 Units into the skin 2 (two) times daily at 10 AM and 5 PM. ) 10 mL 11  . isosorbide mononitrate (IMDUR) 30 MG 24 hr tablet Take 1 tablet (30 mg total) by mouth daily. 90 tablet 1  . meloxicam (MOBIC) 7.5 MG tablet Take 7.5 mg by mouth daily.    . metoprolol tartrate (LOPRESSOR) 50 MG tablet Take 1 tablet (50 mg total) by mouth 2 (two)  times daily. 180 tablet 3  . Misc. Devices MISC 1 wheelchair for mobility.    . nitroGLYCERIN (NITROSTAT) 0.4 MG SL tablet Place 1 tablet (0.4 mg total) under the tongue every 5 (five) minutes x 3 doses as needed for chest pain. 25 tablet 4  . Nutritional Supplements (FEEDING SUPPLEMENT, GLUCERNA 1.2 CAL,) LIQD Take as directed by physician    . oxybutynin (DITROPAN) 5 MG tablet Take 5 mg by mouth daily.     . pantoprazole (PROTONIX) 40 MG tablet Take 1 tablet (40 mg total) by mouth daily. 30 tablet 1  . polyethylene glycol (MIRALAX / GLYCOLAX) packet Take 17 g by mouth 2 (two) times daily.    . prochlorperazine (COMPAZINE) 10 MG tablet Take 1 tablet (10 mg total) by mouth every 6 (six) hours as needed for nausea or vomiting. (Patient not taking: Reported on 07/08/2018) 30 tablet 0  . senna-docusate (SENOKOT-S) 8.6-50 MG tablet Take 1 tablet by mouth 2 (two) times daily. While taking strong pain meds to prevent constipation 20 tablet 0  . Sennosides (EX-LAX PO) Take 2 tablets by mouth daily as needed (constipation).    . traMADol (ULTRAM) 50 MG tablet Take 1-2 tablets (50-100 mg total) by mouth every 6 (six) hours as needed for moderate pain or severe pain. Post-operatively (Patient not taking: Reported on 07/08/2018) 20 tablet 0   No current facility-administered medications for this visit.    Facility-Administered Medications Ordered in Other Visits  Medication Dose Route Frequency Provider Last Rate Last Dose  . sodium chloride flush (NS) 0.9 % injection 10 mL  10 mL Intracatheter PRN Curt Bears, MD        SURGICAL HISTORY:  Past Surgical History:  Procedure Laterality Date  . APPENDECTOMY     "busted on me"  . COLONOSCOPY    . CORONARY ANGIOPLASTY  1997   CFX  . CYSTOSCOPY WITH RETROGRADE PYELOGRAM, URETEROSCOPY AND STENT PLACEMENT Right 04/13/2018   Procedure: CYSTOSCOPY WITH RETROGRADE PYELOGRAM, URETEROSCOPY AND STENT PLACEMENT;  Surgeon: Alexis Frock, MD;  Location: [facility];  Service: Urology;  Laterality: Right;  1 HR  . ENDARTERECTOMY FEMORAL Right 05/13/2016   iliofemoral endarterectomy with bovine pericardial patch angioplasty  . ENDARTERECTOMY FEMORAL Right 05/13/2016   Procedure: RIGHT ENDARTERECTOMY FEMORAL;  Surgeon: Serafina Mitchell, MD;  Location: [facility];  Service: Vascular;  Laterality: Right;  . HOLMIUM LASER APPLICATION Right 0/63/0160   Procedure: HOLMIUM LASER APPLICATION;  Surgeon: Alexis Frock, MD;  Location: [facility];  Service: Urology;  Laterality: Right;  . PATCH ANGIOPLASTY Right 05/13/2016   Procedure: PATCH ANGIOPLASTY USING Rueben Bash BIOLOGIC PATCH;  Surgeon: Serafina Mitchell, MD;  Location: [facility];  Service: Vascular;  Laterality:  Right;  Marland Kitchen PERIPHERAL VASCULAR CATHETERIZATION N/A 02/20/2016   Procedure: Lower Extremity Angiography;  Surgeon: Lorretta Harp, MD;  Location: [facility];  Service: Cardiovascular;  Laterality: N/A;    REVIEW OF SYSTEMS:  A comprehensive review of systems was negative except for: Constitutional: positive for fatigue Respiratory: positive for dyspnea on exertion   PHYSICAL EXAMINATION: General appearance: alert, cooperative, fatigued and no distress Head: Normocephalic, without obvious abnormality, atraumatic Neck: no adenopathy, no JVD, supple, symmetrical, trachea midline and thyroid not enlarged, symmetric, no tenderness/mass/nodules Lymph nodes: Cervical, supraclavicular, and axillary nodes normal. Resp: clear to auscultation bilaterally Back: symmetric, no curvature. ROM normal. No CVA tenderness. Cardio: regular rate and rhythm, S1, S2 normal, no murmur, click, rub or gallop GI: soft, non-tender; bowel sounds normal; no masses,  no organomegaly Extremities: extremities normal, atraumatic, no cyanosis or edema  ECOG PERFORMANCE STATUS: 1 - Symptomatic but completely ambulatory  Blood pressure 115/63, pulse (!) 101, temperature 97.7 F (36.5 C), temperature source Oral, resp. rate 18, height 5'  9" (1.753 m), weight 163 lb 12.8 oz (74.3 kg), SpO2 92 %.  LABORATORY DATA: Lab Results  Component Value Date   WBC 10.5 07/14/2018   HGB 8.3 (L) 07/14/2018   HCT 26.3 (L) 07/14/2018   MCV 107.3 (H) 07/14/2018   PLT 189 07/14/2018      Chemistry      Component Value Date/Time   NA 140 07/14/2018 0729   NA 133 (L) 03/30/2018 0829   K 4.6 07/14/2018 0729   CL 102 07/14/2018 0729   CO2 29 07/14/2018 0729   BUN 19 07/14/2018 0729   BUN 31 (H) 03/30/2018 0829   CREATININE 1.24 07/14/2018 0729   CREATININE 1.46 (H) 02/27/2016 0820      Component Value Date/Time   CALCIUM 9.5 07/14/2018 0729   ALKPHOS 104 07/14/2018 0729   AST 14 (L) 07/14/2018 0729   ALT 14 07/14/2018 0729   BILITOT 0.5 07/14/2018 0729       RADIOGRAPHIC STUDIES: Dg Chest 1 View  Result Date: 07/10/2018 CLINICAL DATA:  Status post right thoracentesis EXAM: CHEST  1 VIEW COMPARISON:  07/08/2018 FINDINGS: Significant improvement in the right effusion following thoracentesis. Very small right basilar residual hydropneumothorax, suspect related to incomplete re-expansion. Trace left effusion. Stable cardiomegaly with some improvement in the airspace process/edema. Resolving CHF is favored. Difficult to exclude underlying right lower lung pneumonia. Trachea is midline.  Aorta atherosclerotic. IMPRESSION: Improvement in the right pleural effusion following thoracentesis. Very small right basilar residual hydropneumothorax compatible with incomplete lung re-expansion. Improvement in the CHF pattern compared 07/08/2018 Residual more consolidated right lung airspace disease. Electronically Signed   By: Jerilynn Mages.  Shick M.D.   On: 07/10/2018 09:37   Dg Chest Port 1 View  Result Date: 07/08/2018 CLINICAL DATA:  Shortness of breath EXAM: PORTABLE CHEST 1 VIEW COMPARISON:  Chest CT 06/14/2018 FINDINGS: Medium-sized right pleural effusion with associated atelectasis. Aeration of the right lung is slightly worse than on the CT of  06/14/2018. There is moderate cardiomegaly. Left lung is well aerated without focal consolidation or pulmonary edema. IMPRESSION: Medium-sized right pleural effusion with associated atelectasis. Electronically Signed   By: Ulyses Jarred M.D.   On: 07/08/2018 06:00   US Thoracentesis Asp Pleural Space W/img Guide  Result Date: 07/10/2018 INDICATION: Patient with history of small-cell lung cancer, dyspnea, and right-sided pleural effusion. Request is made for therapeutic right thoracentesis. EXAM: ULTRASOUND GUIDED THERAPEUTIC RIGHT THORACENTESIS MEDICATIONS: 10 mL of 1% lidocaine COMPLICATIONS:  None immediate. PROCEDURE: An ultrasound guided thoracentesis was thoroughly discussed with the patient and questions answered. The benefits, risks, alternatives and complications were also discussed. The patient understands and wishes to proceed with the procedure. Written consent was obtained. Ultrasound was performed to localize and mark an adequate pocket of fluid in the right chest. The area was then prepped and draped in the normal sterile fashion. 1% Lidocaine was used for local anesthesia. Under ultrasound guidance a 6 Fr Safe-T-Centesis catheter was introduced. Thoracentesis was performed. The catheter was removed and a dressing applied. FINDINGS: A total of approximately 800 mL of clear yellow fluid was removed. IMPRESSION: Successful ultrasound guided right thoracentesis yielding 800 mL of pleural fluid. Read by: Earley Abide, PA-C Electronically Signed   By: Aletta Edouard M.D.   On: 07/10/2018 09:42    ASSESSMENT AND PLAN: This is a very pleasant 77 years old white male recently diagnosed with extensive stage (T2b, N3, M1c) small cell lung cancer presented with right upper lobe lung mass in addition to right mediastinal and supraclavicular lymphadenopathy as well as metastatic disease to the retroperitoneal lymph nodes, left adrenal gland as well as bone disease in the right scapula diagnosed in August  2019. The patient was a started on systemic chemotherapy with carboplatin, etoposide and Tecentriq status post 5 cycles.  He is tolerating this treatment well except for fatigue. I recommended for the patient to proceed with cycle #6 today as scheduled. For the chemotherapy-induced anemia, we will continue to monitor his hemoglobin and hematocrit closely and consider the patient for transfusion if needed. I will see him back for follow-up visit in 3 weeks for evaluation after repeating CT scan of the chest, abdomen and pelvis for restaging of his disease. The patient was advised to call immediately if he has any concerning symptoms in the interval. The patient voices understanding of current disease status and treatment options and is in agreement with the current care plan. All questions were answered. The patient knows to call the clinic with any problems, questions or concerns. We can certainly see the patient much sooner if necessary.  I spent 10 minutes counseling the patient face to face. The total time spent in the appointment was 15 minutes.  Disclaimer: This note was dictated with voice recognition software. Similar sounding words can inadvertently be transcribed and may not be corrected upon review.

## 2018-07-14 NOTE — Patient Instructions (Signed)
[facility] Discharge Instructions for Patients Receiving Chemotherapy  Today you received the following chemotherapy agents Tecentriq, Carboplatin, and Etoposide.  To help prevent nausea and vomiting after your treatment, we encourage you to take your nausea medication as directed.  If you develop nausea and vomiting that is not controlled by your nausea medication, call the clinic.   BELOW ARE SYMPTOMS THAT SHOULD BE REPORTED IMMEDIATELY:  *FEVER GREATER THAN 100.5 F  *CHILLS WITH OR WITHOUT FEVER  NAUSEA AND VOMITING THAT IS NOT CONTROLLED WITH YOUR NAUSEA MEDICATION  *UNUSUAL SHORTNESS OF BREATH  *UNUSUAL BRUISING OR BLEEDING  TENDERNESS IN MOUTH AND THROAT WITH OR WITHOUT PRESENCE OF ULCERS  *URINARY PROBLEMS  *BOWEL PROBLEMS  UNUSUAL RASH Items with * indicate a potential emergency and should be followed up as soon as possible.  Feel free to call the clinic should you have any questions or concerns. The clinic phone number is [phone].  Please show the [facility] [place] at check-in to the Emergency Department and triage nurse.

## 2018-07-14 NOTE — Progress Notes (Signed)
Upon discharge infiltrated IV site no longer edematous and no longer "tender" per patient. Reinforced Arthur [name]'s instructions for patient to call the office with any arm pain or swelling. Patient verbalized understanding.

## 2018-07-14 NOTE — Telephone Encounter (Signed)
Appts already scheduled per 12/26 los.

## 2018-07-15 ENCOUNTER — Telehealth: Payer: Self-pay

## 2018-07-15 ENCOUNTER — Inpatient Hospital Stay: Payer: Medicare Other

## 2018-07-15 VITALS — BP 121/73 | HR 82 | Temp 98.3°F | Resp 18

## 2018-07-15 DIAGNOSIS — C3491 Malignant neoplasm of unspecified part of right bronchus or lung: Secondary | ICD-10-CM

## 2018-07-15 DIAGNOSIS — C3481 Malignant neoplasm of overlapping sites of right bronchus and lung: Secondary | ICD-10-CM | POA: Diagnosis not present

## 2018-07-15 MED ORDER — SODIUM CHLORIDE 0.9 % IV SOLN
100.0000 mg/m2 | Freq: Once | INTRAVENOUS | Status: AC
  Administered 2018-07-15: 190 mg via INTRAVENOUS
  Filled 2018-07-15: qty 9.5

## 2018-07-15 MED ORDER — SODIUM CHLORIDE 0.9 % IV SOLN
Freq: Once | INTRAVENOUS | Status: AC
  Administered 2018-07-15: 14:00:00 via INTRAVENOUS
  Filled 2018-07-15: qty 250

## 2018-07-15 MED ORDER — DEXAMETHASONE SODIUM PHOSPHATE 10 MG/ML IJ SOLN
10.0000 mg | Freq: Once | INTRAMUSCULAR | Status: AC
  Administered 2018-07-15: 10 mg via INTRAVENOUS

## 2018-07-15 MED ORDER — DEXAMETHASONE SODIUM PHOSPHATE 10 MG/ML IJ SOLN
INTRAMUSCULAR | Status: AC
  Filled 2018-07-15: qty 1

## 2018-07-15 NOTE — Progress Notes (Signed)
   DATE: 07/14/2018      IV EXTRAVASATION (neutral)  MD:  Dr. Julien Nordmann  AGENT RECEIVED AT TIME OF EXTRAVASATION:   Tecentriq  IV SITE LOCATION: Left anterior forearm  INTERVENTION:  1) IV stopped  2) 0 ml liquid/blood aspirated from IV site.  3) Patient Teaching Instruction Sheet reviewed with Arthur [name].  A) Apply heat to area for 15 to 20 minutes 4 times daily for the next 48 hours B) Elevate the affected site for the next 48 hours. C) Arthur [name] was instructed to call [phone] if he has questions or notes acute changes to the area.  The patient was instructed to monitor the   area.  He does not need to return for 24 and 48-hour follow-up given the fact that this was a neutral agent.  He will return as scheduled and sooner   if needed.  Review of Systems  HENT: Negative for trouble swallowing.   Respiratory: Negative for chest tightness and shortness of breath.   Cardiovascular: Negative for chest pain.  Skin:       Swelling in the left anterior forearm immediately proximal to the IV insertion site.    Physical Exam Constitutional:      Appearance: Normal appearance.  HENT:     Head: Normocephalic and atraumatic.  Skin:    General: Skin is warm and dry.     Comments: Swelling in the left anterior forearm and an area that is immediately proximal to the IV insertion site.  Neurological:     Mental Status: He is alert.  Psychiatric:        Mood and Affect: Mood normal.        Behavior: Behavior normal.        Thought Content: Thought content normal.        Judgment: Judgment normal.     Arthur [name], MHS, PA-C

## 2018-07-15 NOTE — Patient Instructions (Signed)
[facility] Discharge Instructions for Patients Receiving Chemotherapy  Today you received the following chemotherapy agents  Etoposide.  To help prevent nausea and vomiting after your treatment, we encourage you to take your nausea medication as directed.  If you develop nausea and vomiting that is not controlled by your nausea medication, call the clinic.   BELOW ARE SYMPTOMS THAT SHOULD BE REPORTED IMMEDIATELY:  *FEVER GREATER THAN 100.5 F  *CHILLS WITH OR WITHOUT FEVER  NAUSEA AND VOMITING THAT IS NOT CONTROLLED WITH YOUR NAUSEA MEDICATION  *UNUSUAL SHORTNESS OF BREATH  *UNUSUAL BRUISING OR BLEEDING  TENDERNESS IN MOUTH AND THROAT WITH OR WITHOUT PRESENCE OF ULCERS  *URINARY PROBLEMS  *BOWEL PROBLEMS  UNUSUAL RASH Items with * indicate a potential emergency and should be followed up as soon as possible.  Feel free to call the clinic should you have any questions or concerns. The clinic phone number is [phone].  Please show the [facility] [place] at check-in to the Emergency Department and triage nurse.

## 2018-07-15 NOTE — Telephone Encounter (Signed)
Per 12/26 no los

## 2018-07-16 ENCOUNTER — Inpatient Hospital Stay: Payer: Medicare Other

## 2018-07-16 VITALS — BP 133/73 | HR 99 | Temp 98.0°F | Resp 18

## 2018-07-16 DIAGNOSIS — C3481 Malignant neoplasm of overlapping sites of right bronchus and lung: Secondary | ICD-10-CM | POA: Diagnosis not present

## 2018-07-16 MED ORDER — SODIUM CHLORIDE 0.9 % IV SOLN
Freq: Once | INTRAVENOUS | Status: AC
  Administered 2018-07-16: 10:00:00 via INTRAVENOUS
  Filled 2018-07-16: qty 250

## 2018-07-16 MED ORDER — DEXAMETHASONE SODIUM PHOSPHATE 10 MG/ML IJ SOLN
10.0000 mg | Freq: Once | INTRAMUSCULAR | Status: AC
  Administered 2018-07-16: 10 mg via INTRAVENOUS

## 2018-07-16 MED ORDER — PEGFILGRASTIM 6 MG/0.6ML ~~LOC~~ PSKT
6.0000 mg | PREFILLED_SYRINGE | Freq: Once | SUBCUTANEOUS | Status: AC
  Administered 2018-07-16: 6 mg via SUBCUTANEOUS

## 2018-07-16 MED ORDER — SODIUM CHLORIDE 0.9 % IV SOLN
100.0000 mg/m2 | Freq: Once | INTRAVENOUS | Status: AC
  Administered 2018-07-16: 190 mg via INTRAVENOUS
  Filled 2018-07-16: qty 9.5

## 2018-07-16 MED ORDER — DEXAMETHASONE SODIUM PHOSPHATE 10 MG/ML IJ SOLN
INTRAMUSCULAR | Status: AC
  Filled 2018-07-16: qty 1

## 2018-07-16 MED ORDER — PEGFILGRASTIM 6 MG/0.6ML ~~LOC~~ PSKT
PREFILLED_SYRINGE | SUBCUTANEOUS | Status: AC
  Filled 2018-07-16: qty 0.6

## 2018-07-16 NOTE — Patient Instructions (Signed)
[facility] Discharge Instructions for Patients Receiving Chemotherapy  Today you received the following chemotherapy agents:  Vepesid/Etoposide  To help prevent nausea and vomiting after your treatment, we encourage you to take your nausea medication as prescribed.   If you develop nausea and vomiting that is not controlled by your nausea medication, call the clinic.   BELOW ARE SYMPTOMS THAT SHOULD BE REPORTED IMMEDIATELY:  *FEVER GREATER THAN 100.5 F  *CHILLS WITH OR WITHOUT FEVER  NAUSEA AND VOMITING THAT IS NOT CONTROLLED WITH YOUR NAUSEA MEDICATION  *UNUSUAL SHORTNESS OF BREATH  *UNUSUAL BRUISING OR BLEEDING  TENDERNESS IN MOUTH AND THROAT WITH OR WITHOUT PRESENCE OF ULCERS  *URINARY PROBLEMS  *BOWEL PROBLEMS  UNUSUAL RASH Items with * indicate a potential emergency and should be followed up as soon as possible.  Feel free to call the clinic should you have any questions or concerns. The clinic phone number is [phone].  Please show the [facility] [place] at check-in to the Emergency Department and triage nurse.

## 2018-08-01 ENCOUNTER — Ambulatory Visit (HOSPITAL_COMMUNITY)
Admission: RE | Admit: 2018-08-01 | Discharge: 2018-08-01 | Disposition: A | Discharge status: Home / Self Care | Payer: Medicare Other | Source: Ambulatory Visit | Attending: Internal Medicine | Admitting: Internal Medicine

## 2018-08-01 DIAGNOSIS — C3491 Malignant neoplasm of unspecified part of right bronchus or lung: Secondary | ICD-10-CM | POA: Diagnosis present

## 2018-08-01 MED ORDER — SODIUM CHLORIDE (PF) 0.9 % IJ SOLN
INTRAMUSCULAR | Status: AC
  Filled 2018-08-01: qty 50

## 2018-08-01 MED ORDER — IOHEXOL 300 MG/ML  SOLN
30.0000 mL | Freq: Once | INTRAMUSCULAR | Status: AC | PRN
  Administered 2018-08-01: 30 mL via ORAL

## 2018-08-01 MED ORDER — IOHEXOL 300 MG/ML  SOLN
100.0000 mL | Freq: Once | INTRAMUSCULAR | Status: AC | PRN
  Administered 2018-08-01: 100 mL via INTRAVENOUS

## 2018-08-02 ENCOUNTER — Encounter: Payer: Self-pay | Admitting: Internal Medicine

## 2018-08-02 ENCOUNTER — Inpatient Hospital Stay: Payer: Medicare Other

## 2018-08-02 ENCOUNTER — Inpatient Hospital Stay (HOSPITAL_BASED_OUTPATIENT_CLINIC_OR_DEPARTMENT_OTHER): Payer: Medicare Other | Admitting: Internal Medicine

## 2018-08-02 ENCOUNTER — Inpatient Hospital Stay: Payer: Medicare Other | Attending: Internal Medicine

## 2018-08-02 ENCOUNTER — Telehealth: Payer: Self-pay

## 2018-08-02 VITALS — BP 104/62 | HR 96 | Temp 98.2°F | Resp 17 | Ht 69.0 in | Wt 158.7 lb

## 2018-08-02 DIAGNOSIS — I2510 Atherosclerotic heart disease of native coronary artery without angina pectoris: Secondary | ICD-10-CM

## 2018-08-02 DIAGNOSIS — N400 Enlarged prostate without lower urinary tract symptoms: Secondary | ICD-10-CM | POA: Insufficient documentation

## 2018-08-02 DIAGNOSIS — C3481 Malignant neoplasm of overlapping sites of right bronchus and lung: Secondary | ICD-10-CM | POA: Diagnosis present

## 2018-08-02 DIAGNOSIS — E785 Hyperlipidemia, unspecified: Secondary | ICD-10-CM | POA: Insufficient documentation

## 2018-08-02 DIAGNOSIS — K5730 Diverticulosis of large intestine without perforation or abscess without bleeding: Secondary | ICD-10-CM

## 2018-08-02 DIAGNOSIS — K8020 Calculus of gallbladder without cholecystitis without obstruction: Secondary | ICD-10-CM | POA: Insufficient documentation

## 2018-08-02 DIAGNOSIS — I513 Intracardiac thrombosis, not elsewhere classified: Secondary | ICD-10-CM | POA: Insufficient documentation

## 2018-08-02 DIAGNOSIS — Z9221 Personal history of antineoplastic chemotherapy: Secondary | ICD-10-CM | POA: Insufficient documentation

## 2018-08-02 DIAGNOSIS — J948 Other specified pleural conditions: Secondary | ICD-10-CM | POA: Diagnosis not present

## 2018-08-02 DIAGNOSIS — N189 Chronic kidney disease, unspecified: Secondary | ICD-10-CM | POA: Insufficient documentation

## 2018-08-02 DIAGNOSIS — R5382 Chronic fatigue, unspecified: Secondary | ICD-10-CM

## 2018-08-02 DIAGNOSIS — Z87442 Personal history of urinary calculi: Secondary | ICD-10-CM | POA: Insufficient documentation

## 2018-08-02 DIAGNOSIS — Z7982 Long term (current) use of aspirin: Secondary | ICD-10-CM

## 2018-08-02 DIAGNOSIS — I313 Pericardial effusion (noninflammatory): Secondary | ICD-10-CM

## 2018-08-02 DIAGNOSIS — I129 Hypertensive chronic kidney disease with stage 1 through stage 4 chronic kidney disease, or unspecified chronic kidney disease: Secondary | ICD-10-CM | POA: Insufficient documentation

## 2018-08-02 DIAGNOSIS — C3491 Malignant neoplasm of unspecified part of right bronchus or lung: Secondary | ICD-10-CM

## 2018-08-02 DIAGNOSIS — Z7984 Long term (current) use of oral hypoglycemic drugs: Secondary | ICD-10-CM

## 2018-08-02 DIAGNOSIS — I712 Thoracic aortic aneurysm, without rupture: Secondary | ICD-10-CM | POA: Insufficient documentation

## 2018-08-02 DIAGNOSIS — C7972 Secondary malignant neoplasm of left adrenal gland: Secondary | ICD-10-CM | POA: Diagnosis not present

## 2018-08-02 DIAGNOSIS — Z7189 Other specified counseling: Secondary | ICD-10-CM

## 2018-08-02 DIAGNOSIS — E119 Type 2 diabetes mellitus without complications: Secondary | ICD-10-CM

## 2018-08-02 DIAGNOSIS — K219 Gastro-esophageal reflux disease without esophagitis: Secondary | ICD-10-CM | POA: Diagnosis not present

## 2018-08-02 DIAGNOSIS — R5383 Other fatigue: Secondary | ICD-10-CM

## 2018-08-02 DIAGNOSIS — I739 Peripheral vascular disease, unspecified: Secondary | ICD-10-CM

## 2018-08-02 DIAGNOSIS — I252 Old myocardial infarction: Secondary | ICD-10-CM | POA: Insufficient documentation

## 2018-08-02 DIAGNOSIS — F17210 Nicotine dependence, cigarettes, uncomplicated: Secondary | ICD-10-CM | POA: Insufficient documentation

## 2018-08-02 DIAGNOSIS — J90 Pleural effusion, not elsewhere classified: Secondary | ICD-10-CM

## 2018-08-02 DIAGNOSIS — Z79899 Other long term (current) drug therapy: Secondary | ICD-10-CM

## 2018-08-02 DIAGNOSIS — I10 Essential (primary) hypertension: Secondary | ICD-10-CM

## 2018-08-02 DIAGNOSIS — Z5112 Encounter for antineoplastic immunotherapy: Secondary | ICD-10-CM | POA: Insufficient documentation

## 2018-08-02 DIAGNOSIS — M5136 Other intervertebral disc degeneration, lumbar region: Secondary | ICD-10-CM

## 2018-08-02 DIAGNOSIS — M899 Disorder of bone, unspecified: Secondary | ICD-10-CM | POA: Insufficient documentation

## 2018-08-02 DIAGNOSIS — C786 Secondary malignant neoplasm of retroperitoneum and peritoneum: Secondary | ICD-10-CM | POA: Insufficient documentation

## 2018-08-02 LAB — CMP (CANCER CENTER ONLY)
ALT: 10 U/L (ref 0–44)
AST: 12 U/L — ABNORMAL LOW (ref 15–41)
Albumin: 3.5 g/dL (ref 3.5–5.0)
Alkaline Phosphatase: 106 U/L (ref 38–126)
Anion gap: 10 (ref 5–15)
BUN: 16 mg/dL (ref 8–23)
CO2: 27 mmol/L (ref 22–32)
Calcium: 8.7 mg/dL — ABNORMAL LOW (ref 8.9–10.3)
Chloride: 101 mmol/L (ref 98–111)
Creatinine: 1.3 mg/dL — ABNORMAL HIGH (ref 0.61–1.24)
GFR, Est AFR Am: >60 mL/min (ref >60)
GFR, Estimated: 52 mL/min — ABNORMAL LOW (ref >60)
Glucose, Bld: 163 mg/dL — ABNORMAL HIGH (ref 70–99)
Potassium: 4.1 mmol/L (ref 3.5–5.1)
Sodium: 138 mmol/L (ref 135–145)
Total Bilirubin: 0.6 mg/dL (ref 0.3–1.2)
Total Protein: 6.7 g/dL (ref 6.5–8.1)

## 2018-08-02 LAB — CBC WITH DIFFERENTIAL (CANCER CENTER ONLY)
Abs Immature Granulocytes: 0.11 10*3/uL — ABNORMAL HIGH (ref 0.00–0.07)
Basophils Absolute: 0 10*3/uL (ref 0.0–0.1)
Basophils Relative: 0 %
EOS PCT: 0 %
Eosinophils Absolute: 0 10*3/uL (ref 0.0–0.5)
HCT: 24.1 % — ABNORMAL LOW (ref 39.0–52.0)
Hemoglobin: 7.8 g/dL — ABNORMAL LOW (ref 13.0–17.0)
Immature Granulocytes: 1 %
Lymphocytes Relative: 8 %
Lymphs Abs: 0.9 10*3/uL (ref 0.7–4.0)
MCH: 34.5 pg — ABNORMAL HIGH (ref 26.0–34.0)
MCHC: 32.4 g/dL (ref 30.0–36.0)
MCV: 106.6 fL — ABNORMAL HIGH (ref 80.0–100.0)
Monocytes Absolute: 0.9 10*3/uL (ref 0.1–1.0)
Monocytes Relative: 8 %
Neutro Abs: 9.8 10*3/uL — ABNORMAL HIGH (ref 1.7–7.7)
Neutrophils Relative %: 83 %
Platelet Count: 91 10*3/uL — ABNORMAL LOW (ref 150–400)
RBC: 2.26 MIL/uL — ABNORMAL LOW (ref 4.22–5.81)
RDW: 18.1 % — ABNORMAL HIGH (ref 11.5–15.5)
WBC Count: 11.8 10*3/uL — ABNORMAL HIGH (ref 4.0–10.5)
nRBC: 0 % (ref 0.0–0.2)

## 2018-08-02 LAB — TSH: TSH: 2.071 u[IU]/mL (ref 0.320–4.118)

## 2018-08-02 MED ORDER — SODIUM CHLORIDE 0.9 % IV SOLN
Freq: Once | INTRAVENOUS | Status: AC
  Administered 2018-08-02: 10:00:00 via INTRAVENOUS
  Filled 2018-08-02: qty 250

## 2018-08-02 MED ORDER — SODIUM CHLORIDE 0.9 % IV SOLN
1200.0000 mg | Freq: Once | INTRAVENOUS | Status: AC
  Administered 2018-08-02: 1200 mg via INTRAVENOUS
  Filled 2018-08-02: qty 20

## 2018-08-02 NOTE — Patient Instructions (Signed)
[facility] Discharge Instructions for Patients Receiving Chemotherapy  Today you received the following chemotherapy agents Tecentriq.  To help prevent nausea and vomiting after your treatment, we encourage you to take your nausea medication .   If you develop nausea and vomiting that is not controlled by your nausea medication, call the clinic.   BELOW ARE SYMPTOMS THAT SHOULD BE REPORTED IMMEDIATELY:  *FEVER GREATER THAN 100.5 F  *CHILLS WITH OR WITHOUT FEVER  NAUSEA AND VOMITING THAT IS NOT CONTROLLED WITH YOUR NAUSEA MEDICATION  *UNUSUAL SHORTNESS OF BREATH  *UNUSUAL BRUISING OR BLEEDING  TENDERNESS IN MOUTH AND THROAT WITH OR WITHOUT PRESENCE OF ULCERS  *URINARY PROBLEMS  *BOWEL PROBLEMS  UNUSUAL RASH Items with * indicate a potential emergency and should be followed up as soon as possible.  Feel free to call the clinic should you have any questions or concerns. The clinic phone number is [phone].  Please show the [facility] at check-in to the Emergency Department and triage nurse.

## 2018-08-02 NOTE — Progress Notes (Signed)
[facility] [phone]   [phone]  OFFICE PROGRESS NOTE  Dione Housekeeper, MD [place] [address]  DIAGNOSIS: Extensive stage (T2b, N3, M1c) small cell lung cancer, presented with large right upper lobe lung mass with extension to the right hilum in addition to the external and supraclavicular lymphadenopathy as well as metastatic disease to the retroperitoneal lymph nodes and left adrenal gland and suspicious right scapular bone lesion diagnosed in August 2019  PRIOR THERAPY: Systemic chemotherapy with carboplatin for AUC of 5 on day 1, etoposide 100 mg/M2 on days 1, 2 and 3 as well as Tecentriq 1200 mg IV every 3 weeks with Neulasta support.  First dose 03/28/2018.  Status post 6 cycles.   CURRENT THERAPY: Maintenance treatment with single agent Tecentriq 1200 mg IV every 3 weeks.  First dose today August 02, 2018.  INTERVAL HISTORY: Daily Crate III 78 y.o. male returns to the clinic today for follow-up visit accompanied by a family member.  The patient is feeling fine today with no concerning complaints except for fatigue and shortness of breath with exertion.  He denied having any chest pain, cough or hemoptysis.  He denied having any fever or chills.  He has no nausea, vomiting, diarrhea or constipation.  He denied having any headache or visual changes.  He has no recent weight loss or night sweats.  He tolerated the last cycle of his chemotherapy fairly well.  The patient had repeat CT scan of the chest, abdomen and pelvis performed recently and he is here for evaluation and discussion of his scan results.  MEDICAL HISTORY: Past Medical History:  Diagnosis Date  . Arthritis   . Ascending aortic aneurysm (HCC)    PER CT CHEST 03-08-18 IN [facility] , 4.0CM  . Chronic kidney disease   . Claudication ([place])   . Coronary artery disease 1997   CFX PCI, '97. Mod residual RI and RCA dis.  . Diabetes mellitus ([place])    IDDM  .  Dyslipidemia   . Dyspnea   . GERD (gastroesophageal reflux disease)   . Hard of hearing   . History of bronchitis   . History of chemotherapy   . History of kidney stones 1958  . HTN (hypertension)   . Lacunar stroke ([place])   . Metastatic lung cancer (metastasis from lung to other site), unspecified laterality ([place])    DIAGNOSED 02-2018, MANAGED BY DR [place]  . NSTEMI (non-ST elevated myocardial infarction) ([place])   . PAD (peripheral artery disease) ([place])   . Pneumonia   . Tobacco abuse   . Urinary incontinence   . Wears glasses     ALLERGIES:  is allergic to codeine.  MEDICATIONS:  Current Outpatient Medications  Medication Sig Dispense Refill  . aspirin EC 81 MG EC tablet Take 1 tablet (81 mg total) by mouth daily. 30 tablet 1  . atorvastatin (LIPITOR) 20 MG tablet TAKE 1 TABLET (20 MG TOTAL) BY MOUTH DAILY. (Patient taking differently: Take 20 mg by mouth daily at 6 PM. ) 90 tablet 2  . clopidogrel (PLAVIX) 75 MG tablet Take 1 tablet (75 mg total) by mouth daily. 30 tablet 1  . docusate sodium (COLACE) 100 MG capsule Take 100 mg by mouth 2 (two) times daily.    Marland Kitchen doxazosin (CARDURA) 1 MG tablet Take 3 mg by mouth at bedtime. Pt takes 3 at bedtime     . furosemide (LASIX) 20 MG tablet Take  1 tablet (20 mg total) by mouth daily. 30 tablet 0  . glucose blood (ONETOUCH VERIO) test strip CHECK BLOOD SUGARS 3 DAILY:DX 250.40 One touch verio    . insulin lispro (HUMALOG) 100 UNIT/ML injection Inject 0.35 mLs (35 Units total) into the skin 2 (two) times daily. (Patient taking differently: Inject 25-40 Units into the skin 2 (two) times daily at 10 AM and 5 PM. ) 10 mL 11  . isosorbide mononitrate (IMDUR) 30 MG 24 hr tablet Take 1 tablet (30 mg total) by mouth daily. 90 tablet 1  . meloxicam (MOBIC) 7.5 MG tablet Take 7.5 mg by mouth daily.    . nitroGLYCERIN (NITROSTAT) 0.4 MG SL tablet Place 1 tablet (0.4 mg total) under the tongue every 5 (five) minutes x 3 doses as  needed for chest pain. 25 tablet 4  . Nutritional Supplements (FEEDING SUPPLEMENT, GLUCERNA 1.2 CAL,) LIQD Take as directed by physician    . pantoprazole (PROTONIX) 40 MG tablet Take 1 tablet (40 mg total) by mouth daily. 30 tablet 1  . polyethylene glycol (MIRALAX / GLYCOLAX) packet Take 17 g by mouth 2 (two) times daily.    . prochlorperazine (COMPAZINE) 10 MG tablet Take 1 tablet (10 mg total) by mouth every 6 (six) hours as needed for nausea or vomiting. 30 tablet 0  . senna-docusate (SENOKOT-S) 8.6-50 MG tablet Take 1 tablet by mouth 2 (two) times daily. While taking strong pain meds to prevent constipation 20 tablet 0  . Sennosides (EX-LAX PO) Take 2 tablets by mouth daily as needed (constipation).    Marland Kitchen amLODipine (NORVASC) 5 MG tablet Take 1 tablet (5 mg total) by mouth daily. 180 tablet 3  . metoprolol tartrate (LOPRESSOR) 50 MG tablet Take 1 tablet (50 mg total) by mouth 2 (two) times daily. (Patient not taking: Reported on 08/02/2018) 180 tablet 3  . Misc. Devices MISC 1 wheelchair for mobility.     No current facility-administered medications for this visit.    Facility-Administered Medications Ordered in Other Visits  Medication Dose Route Frequency Provider Last Rate Last Dose  . sodium chloride flush (NS) 0.9 % injection 10 mL  10 mL Intracatheter PRN Curt Bears, MD        SURGICAL HISTORY:  Past Surgical History:  Procedure Laterality Date  . APPENDECTOMY     "busted on me"  . COLONOSCOPY    . CORONARY ANGIOPLASTY  1997   CFX  . CYSTOSCOPY WITH RETROGRADE PYELOGRAM, URETEROSCOPY AND STENT PLACEMENT Right 04/13/2018   Procedure: CYSTOSCOPY WITH RETROGRADE PYELOGRAM, URETEROSCOPY AND STENT PLACEMENT;  Surgeon: Alexis Frock, MD;  Location: [facility];  Service: Urology;  Laterality: Right;  1 HR  . ENDARTERECTOMY FEMORAL Right 05/13/2016   iliofemoral endarterectomy with bovine pericardial patch angioplasty  . ENDARTERECTOMY FEMORAL Right 05/13/2016   Procedure: RIGHT  ENDARTERECTOMY FEMORAL;  Surgeon: Serafina [place], MD;  Location: [facility];  Service: Vascular;  Laterality: Right;  . HOLMIUM LASER APPLICATION Right 3/70/4888   Procedure: HOLMIUM LASER APPLICATION;  Surgeon: Alexis Frock, MD;  Location: [facility];  Service: Urology;  Laterality: Right;  . PATCH ANGIOPLASTY Right 05/13/2016   Procedure: [place];  Surgeon: Serafina [place], MD;  Location: [facility];  Service: Vascular;  Laterality: Right;  . PERIPHERAL VASCULAR CATHETERIZATION N/A 02/20/2016   Procedure: Lower Extremity Angiography;  Surgeon: Lorretta Harp, MD;  Location: [facility];  Service: Cardiovascular;  Laterality: N/A;    REVIEW OF SYSTEMS:  Constitutional:  positive for fatigue Eyes: negative Ears, nose, mouth, throat, and face: negative Respiratory: positive for dyspnea on exertion Cardiovascular: negative Gastrointestinal: negative Genitourinary:negative Integument/breast: negative Hematologic/lymphatic: negative Musculoskeletal:negative Neurological: negative Behavioral/Psych: negative Endocrine: negative Allergic/Immunologic: negative   PHYSICAL EXAMINATION: General appearance: alert, cooperative, fatigued and no distress Head: Normocephalic, without obvious abnormality, atraumatic Neck: no adenopathy, no JVD, supple, symmetrical, trachea midline and thyroid not enlarged, symmetric, no tenderness/mass/nodules Lymph nodes: Cervical, supraclavicular, and axillary nodes normal. Resp: clear to auscultation bilaterally Back: symmetric, no curvature. ROM normal. No CVA tenderness. Cardio: regular rate and rhythm, S1, S2 normal, no murmur, click, rub or gallop GI: soft, non-tender; bowel sounds normal; no masses,  no organomegaly Extremities: extremities normal, atraumatic, no cyanosis or edema Neurologic: Alert and oriented X 3, normal strength and tone. Normal symmetric reflexes. Normal coordination and gait  ECOG PERFORMANCE STATUS:  1 - Symptomatic but completely ambulatory  Blood pressure 104/62, pulse 96, temperature 98.2 F (36.8 C), temperature source Oral, resp. rate 17, height 5\' 9"  (1.753 m), weight 158 lb 11.2 oz (72 kg), SpO2 96 %.  LABORATORY DATA: Lab Results  Component Value Date   WBC 11.8 (H) 08/02/2018   HGB 7.8 (L) 08/02/2018   HCT 24.1 (L) 08/02/2018   MCV 106.6 (H) 08/02/2018   PLT 91 (L) 08/02/2018      Chemistry      Component Value Date/Time   NA 140 07/14/2018 0729   NA 133 (L) 03/30/2018 0829   K 4.6 07/14/2018 0729   CL 102 07/14/2018 0729   CO2 29 07/14/2018 0729   BUN 19 07/14/2018 0729   BUN 31 (H) 03/30/2018 0829   CREATININE 1.24 07/14/2018 0729   CREATININE 1.46 (H) 02/27/2016 0820      Component Value Date/Time   CALCIUM 9.5 07/14/2018 0729   ALKPHOS 104 07/14/2018 0729   AST 14 (L) 07/14/2018 0729   ALT 14 07/14/2018 0729   BILITOT 0.5 07/14/2018 0729       RADIOGRAPHIC STUDIES: Dg Chest 1 View  Result Date: 07/10/2018 CLINICAL DATA:  Status post right thoracentesis EXAM: CHEST  1 VIEW COMPARISON:  07/08/2018 FINDINGS: Significant improvement in the right effusion following thoracentesis. Very small right basilar residual hydropneumothorax, suspect related to incomplete re-expansion. Trace left effusion. Stable cardiomegaly with some improvement in the airspace process/edema. Resolving CHF is favored. Difficult to exclude underlying right lower lung pneumonia. Trachea is midline.  Aorta atherosclerotic. IMPRESSION: Improvement in the right pleural effusion following thoracentesis. Very small right basilar residual hydropneumothorax compatible with incomplete lung re-expansion. Improvement in the CHF pattern compared 07/08/2018 Residual more consolidated right lung airspace disease. Electronically Signed   By: Jerilynn Mages.  Shick M.D.   On: 07/10/2018 09:37   Ct Chest W Contrast  Result Date: 08/01/2018 CLINICAL DATA:  Metastatic small cell lung cancer, restaging and assessment  of response to therapy. EXAM: CT CHEST, ABDOMEN, AND PELVIS WITH CONTRAST TECHNIQUE: Multidetector CT imaging of the chest, abdomen and pelvis was performed following the standard protocol during bolus administration of intravenous contrast. CONTRAST:  160mL OMNIPAQUE IOHEXOL 300 MG/ML  SOLN COMPARISON:  Multiple exams, including 06/14/2018 FINDINGS: CT CHEST FINDINGS Cardiovascular: Coronary, aortic arch, and branch vessel atherosclerotic vascular disease. Stable chronic mural thrombus in the descending thoracic aorta. Moderate pericardial effusion, stable. Mild left ventricular enlargement. Mediastinum/Nodes: 3.3 by 1.4 cm hypodense right thyroid nodule, stable, with other smaller bilateral thyroid nodules. These thyroid nodules have been negative on PET-CT, generally taken as a reliable indicator of benign etiology. Probable mucus  in the trachea on image 16/2. No significant recurrent thoracic adenopathy. Lungs/Pleura: Moderate right pleural effusion likely with loculated component similar to previous, and with associated passive atelectasis. Azygos fissure noted. Musculoskeletal: Stable nonunited or partially united fracture of the right scapula. Old left posterolateral rib deformities. Small sclerotic lesion in the left humeral head is stable. Stable irregular mixed density and lucency in the right sternal body inferiorly. Stable sclerosis at T3, T9, and T10. CT ABDOMEN PELVIS FINDINGS Hepatobiliary: Numerous gallstones fill much of the gallbladder. Mild gallbladder wall thickening similar to the prior exam. Otherwise unremarkable. Pancreas: Punctate calcification in the pancreatic head as on the prior exam. No significant pancreatic lesion observed. Spleen: Old granulomatous disease of the spleen. Adrenals/Urinary Tract: The adrenal glands appear normal. Small hypodense renal lesions are technically too small to characterize although statistically likely to be benign. The urinary bladder appears unremarkable.  Stomach/Bowel: Descending and sigmoid colon diverticulosis. Vascular/Lymphatic: Aortoiliac atherosclerotic vascular disease. Reproductive: The prostate gland measures 5.3 by 4.5 by 6.0 cm (volume = 75 cm^3) and notably indents the bladder base. Other: No supplemental non-categorized findings. Musculoskeletal: Stable sclerotic lesions at L1, L5, in the iliac bones, in the sacrum, the right ischium, and in the left intertrochanteric region of the femur. Spondylosis and degenerative disc disease at L4-5 causing central narrowing of the thecal sac. IMPRESSION: 1. Moderate right pleural effusion with mostly passive atelectasis in the right lung, roughly stable from the prior exam. 2. Stable appearance of the sclerotic metastatic disease involving the thoracic spine, lumbar spine, left humeral head, sternum, iliac bones, sacrum, right ischium, and left femoral intertrochanteric region. 3. No current appreciable adenopathy. 4. Stable moderate pericardial effusion. 5. Other imaging findings of potential clinical significance: Aortic Atherosclerosis (ICD10-I70.0). Coronary atherosclerosis. Left ventricular enlargement. Stable right thyroid nodule. Stable nonunited or partially united fracture of the right scapula. Cholelithiasis. Stable mild gallbladder wall thickening. Old granulomatous disease. Descending and sigmoid colon diverticulosis. Moderate prostatomegaly. Central narrowing of the thecal sac at L4-5. Electronically Signed   By: Van Clines M.D.   On: 08/01/2018 16:01   Ct Abdomen Pelvis W Contrast  Result Date: 08/01/2018 CLINICAL DATA:  Metastatic small cell lung cancer, restaging and assessment of response to therapy. EXAM: CT CHEST, ABDOMEN, AND PELVIS WITH CONTRAST TECHNIQUE: Multidetector CT imaging of the chest, abdomen and pelvis was performed following the standard protocol during bolus administration of intravenous contrast. CONTRAST:  167mL OMNIPAQUE IOHEXOL 300 MG/ML  SOLN COMPARISON:  Multiple  exams, including 06/14/2018 FINDINGS: CT CHEST FINDINGS Cardiovascular: Coronary, aortic arch, and branch vessel atherosclerotic vascular disease. Stable chronic mural thrombus in the descending thoracic aorta. Moderate pericardial effusion, stable. Mild left ventricular enlargement. Mediastinum/Nodes: 3.3 by 1.4 cm hypodense right thyroid nodule, stable, with other smaller bilateral thyroid nodules. These thyroid nodules have been negative on PET-CT, generally taken as a reliable indicator of benign etiology. Probable mucus in the trachea on image 16/2. No significant recurrent thoracic adenopathy. Lungs/Pleura: Moderate right pleural effusion likely with loculated component similar to previous, and with associated passive atelectasis. Azygos fissure noted. Musculoskeletal: Stable nonunited or partially united fracture of the right scapula. Old left posterolateral rib deformities. Small sclerotic lesion in the left humeral head is stable. Stable irregular mixed density and lucency in the right sternal body inferiorly. Stable sclerosis at T3, T9, and T10. CT ABDOMEN PELVIS FINDINGS Hepatobiliary: Numerous gallstones fill much of the gallbladder. Mild gallbladder wall thickening similar to the prior exam. Otherwise unremarkable. Pancreas: Punctate calcification in the pancreatic head as  on the prior exam. No significant pancreatic lesion observed. Spleen: Old granulomatous disease of the spleen. Adrenals/Urinary Tract: The adrenal glands appear normal. Small hypodense renal lesions are technically too small to characterize although statistically likely to be benign. The urinary bladder appears unremarkable. Stomach/Bowel: Descending and sigmoid colon diverticulosis. Vascular/Lymphatic: Aortoiliac atherosclerotic vascular disease. Reproductive: The prostate gland measures 5.3 by 4.5 by 6.0 cm (volume = 75 cm^3) and notably indents the bladder base. Other: No supplemental non-categorized findings. Musculoskeletal:  Stable sclerotic lesions at L1, L5, in the iliac bones, in the sacrum, the right ischium, and in the left intertrochanteric region of the femur. Spondylosis and degenerative disc disease at L4-5 causing central narrowing of the thecal sac. IMPRESSION: 1. Moderate right pleural effusion with mostly passive atelectasis in the right lung, roughly stable from the prior exam. 2. Stable appearance of the sclerotic metastatic disease involving the thoracic spine, lumbar spine, left humeral head, sternum, iliac bones, sacrum, right ischium, and left femoral intertrochanteric region. 3. No current appreciable adenopathy. 4. Stable moderate pericardial effusion. 5. Other imaging findings of potential clinical significance: Aortic Atherosclerosis (ICD10-I70.0). Coronary atherosclerosis. Left ventricular enlargement. Stable right thyroid nodule. Stable nonunited or partially united fracture of the right scapula. Cholelithiasis. Stable mild gallbladder wall thickening. Old granulomatous disease. Descending and sigmoid colon diverticulosis. Moderate prostatomegaly. Central narrowing of the thecal sac at L4-5. Electronically Signed   By: Van Clines M.D.   On: 08/01/2018 16:01   Dg Chest Port 1 View  Result Date: 07/08/2018 CLINICAL DATA:  Shortness of breath EXAM: PORTABLE CHEST 1 VIEW COMPARISON:  Chest CT 06/14/2018 FINDINGS: Medium-sized right pleural effusion with associated atelectasis. Aeration of the right lung is slightly worse than on the CT of 06/14/2018. There is moderate cardiomegaly. Left lung is well aerated without focal consolidation or pulmonary edema. IMPRESSION: Medium-sized right pleural effusion with associated atelectasis. Electronically Signed   By: Ulyses Jarred M.D.   On: 07/08/2018 06:00   US Thoracentesis Asp Pleural Space W/img Guide  Result Date: 07/10/2018 INDICATION: Patient with history of small-cell lung cancer, dyspnea, and right-sided pleural effusion. Request is made for  therapeutic right thoracentesis. EXAM: ULTRASOUND GUIDED THERAPEUTIC RIGHT THORACENTESIS MEDICATIONS: 10 mL of 1% lidocaine COMPLICATIONS: None immediate. PROCEDURE: An ultrasound guided thoracentesis was thoroughly discussed with the patient and questions answered. The benefits, risks, alternatives and complications were also discussed. The patient understands and wishes to proceed with the procedure. Written consent was obtained. Ultrasound was performed to localize and mark an adequate pocket of fluid in the right chest. The area was then prepped and draped in the normal sterile fashion. 1% Lidocaine was used for local anesthesia. Under ultrasound guidance a 6 Fr Safe-T-Centesis catheter was introduced. Thoracentesis was performed. The catheter was removed and a dressing applied. FINDINGS: A total of approximately 800 mL of clear yellow fluid was removed. IMPRESSION: Successful ultrasound guided right thoracentesis yielding 800 mL of pleural fluid. Read by: Earley Abide, PA-C Electronically Signed   By: Aletta Edouard M.D.   On: 07/10/2018 09:42    ASSESSMENT AND PLAN: This is a very pleasant 78 years old white male recently diagnosed with extensive stage (T2b, N3, M1c) small cell lung cancer presented with right upper lobe lung mass in addition to right mediastinal and supraclavicular lymphadenopathy as well as metastatic disease to the retroperitoneal lymph nodes, left adrenal gland as well as bone disease in the right scapula diagnosed in August 2019. The patient was started on systemic chemotherapy with carboplatin, etoposide and  Tecentriq status post 6 cycles.  He tolerated this treatment fairly well with no concerning adverse effect except for fatigue. He had repeat CT scan of the chest, abdomen and pelvis performed recently.  I personally and independently reviewed the scan and discussed the results with the patient and his family member. His scan showed no concerning findings for disease  progression. I recommended for the patient to start treatment with maintenance immunotherapy with Tecentriq 1200 mg IV every 3 weeks.  First dose today. The patient will come back for follow-up visit in 3 weeks for evaluation before the next cycle of his treatment. For the chemotherapy-induced anemia, we will continue to monitor his hemoglobin and hematocrit closely and consider the patient for transfusion in the future if needed. He was advised to call immediately if he has any concerning symptoms in the interval. The patient voices understanding of current disease status and treatment options and is in agreement with the current care plan. All questions were answered. The patient knows to call the clinic with any problems, questions or concerns. We can certainly see the patient much sooner if necessary.  Disclaimer: This note was dictated with voice recognition software. Similar sounding words can inadvertently be transcribed and may not be corrected upon review.

## 2018-08-02 NOTE — Telephone Encounter (Signed)
Per 1/14 patient stated that he does not have a port. I (Jonathen Rathman)remove all port appointments. Per patient request

## 2018-08-02 NOTE — Telephone Encounter (Signed)
Printed avs and calender of upcoming appointment. Per 1/14 los

## 2018-08-23 ENCOUNTER — Other Ambulatory Visit: Payer: Medicare Other

## 2018-08-23 ENCOUNTER — Inpatient Hospital Stay: Payer: Medicare Other | Attending: Internal Medicine

## 2018-08-23 ENCOUNTER — Encounter: Payer: Self-pay | Admitting: Internal Medicine

## 2018-08-23 ENCOUNTER — Inpatient Hospital Stay (HOSPITAL_BASED_OUTPATIENT_CLINIC_OR_DEPARTMENT_OTHER): Payer: Medicare Other | Admitting: Internal Medicine

## 2018-08-23 ENCOUNTER — Inpatient Hospital Stay: Payer: Medicare Other

## 2018-08-23 ENCOUNTER — Telehealth: Payer: Self-pay | Admitting: Internal Medicine

## 2018-08-23 VITALS — BP 124/73 | HR 87 | Temp 98.1°F | Resp 20 | Ht 69.0 in | Wt 164.1 lb

## 2018-08-23 DIAGNOSIS — I129 Hypertensive chronic kidney disease with stage 1 through stage 4 chronic kidney disease, or unspecified chronic kidney disease: Secondary | ICD-10-CM | POA: Diagnosis not present

## 2018-08-23 DIAGNOSIS — R0602 Shortness of breath: Secondary | ICD-10-CM

## 2018-08-23 DIAGNOSIS — E785 Hyperlipidemia, unspecified: Secondary | ICD-10-CM | POA: Diagnosis not present

## 2018-08-23 DIAGNOSIS — M129 Arthropathy, unspecified: Secondary | ICD-10-CM

## 2018-08-23 DIAGNOSIS — N400 Enlarged prostate without lower urinary tract symptoms: Secondary | ICD-10-CM | POA: Diagnosis not present

## 2018-08-23 DIAGNOSIS — R5383 Other fatigue: Secondary | ICD-10-CM

## 2018-08-23 DIAGNOSIS — I714 Abdominal aortic aneurysm, without rupture: Secondary | ICD-10-CM

## 2018-08-23 DIAGNOSIS — K5730 Diverticulosis of large intestine without perforation or abscess without bleeding: Secondary | ICD-10-CM

## 2018-08-23 DIAGNOSIS — I10 Essential (primary) hypertension: Secondary | ICD-10-CM

## 2018-08-23 DIAGNOSIS — K219 Gastro-esophageal reflux disease without esophagitis: Secondary | ICD-10-CM | POA: Diagnosis not present

## 2018-08-23 DIAGNOSIS — M5136 Other intervertebral disc degeneration, lumbar region: Secondary | ICD-10-CM | POA: Insufficient documentation

## 2018-08-23 DIAGNOSIS — I2510 Atherosclerotic heart disease of native coronary artery without angina pectoris: Secondary | ICD-10-CM | POA: Diagnosis not present

## 2018-08-23 DIAGNOSIS — Z87442 Personal history of urinary calculi: Secondary | ICD-10-CM | POA: Insufficient documentation

## 2018-08-23 DIAGNOSIS — I252 Old myocardial infarction: Secondary | ICD-10-CM

## 2018-08-23 DIAGNOSIS — C7972 Secondary malignant neoplasm of left adrenal gland: Secondary | ICD-10-CM

## 2018-08-23 DIAGNOSIS — N189 Chronic kidney disease, unspecified: Secondary | ICD-10-CM | POA: Diagnosis not present

## 2018-08-23 DIAGNOSIS — C3481 Malignant neoplasm of overlapping sites of right bronchus and lung: Secondary | ICD-10-CM | POA: Insufficient documentation

## 2018-08-23 DIAGNOSIS — Z5112 Encounter for antineoplastic immunotherapy: Secondary | ICD-10-CM

## 2018-08-23 DIAGNOSIS — C3491 Malignant neoplasm of unspecified part of right bronchus or lung: Secondary | ICD-10-CM

## 2018-08-23 DIAGNOSIS — E119 Type 2 diabetes mellitus without complications: Secondary | ICD-10-CM | POA: Diagnosis not present

## 2018-08-23 DIAGNOSIS — C778 Secondary and unspecified malignant neoplasm of lymph nodes of multiple regions: Secondary | ICD-10-CM

## 2018-08-23 DIAGNOSIS — J90 Pleural effusion, not elsewhere classified: Secondary | ICD-10-CM | POA: Diagnosis not present

## 2018-08-23 DIAGNOSIS — K8020 Calculus of gallbladder without cholecystitis without obstruction: Secondary | ICD-10-CM | POA: Diagnosis not present

## 2018-08-23 DIAGNOSIS — R5382 Chronic fatigue, unspecified: Secondary | ICD-10-CM

## 2018-08-23 DIAGNOSIS — I313 Pericardial effusion (noninflammatory): Secondary | ICD-10-CM | POA: Diagnosis not present

## 2018-08-23 DIAGNOSIS — E042 Nontoxic multinodular goiter: Secondary | ICD-10-CM | POA: Diagnosis not present

## 2018-08-23 DIAGNOSIS — Z79899 Other long term (current) drug therapy: Secondary | ICD-10-CM

## 2018-08-23 DIAGNOSIS — Z7982 Long term (current) use of aspirin: Secondary | ICD-10-CM | POA: Insufficient documentation

## 2018-08-23 DIAGNOSIS — I513 Intracardiac thrombosis, not elsewhere classified: Secondary | ICD-10-CM

## 2018-08-23 LAB — CBC WITH DIFFERENTIAL (CANCER CENTER ONLY)
Abs Immature Granulocytes: 0.02 10*3/uL (ref 0.00–0.07)
Basophils Absolute: 0.1 10*3/uL (ref 0.0–0.1)
Basophils Relative: 1 %
Eosinophils Absolute: 0.2 10*3/uL (ref 0.0–0.5)
Eosinophils Relative: 3 %
HCT: 30.3 % — ABNORMAL LOW (ref 39.0–52.0)
Hemoglobin: 9.5 g/dL — ABNORMAL LOW (ref 13.0–17.0)
Immature Granulocytes: 0 %
Lymphocytes Relative: 11 %
Lymphs Abs: 0.8 10*3/uL (ref 0.7–4.0)
MCH: 32.2 pg (ref 26.0–34.0)
MCHC: 31.4 g/dL (ref 30.0–36.0)
MCV: 102.7 fL — ABNORMAL HIGH (ref 80.0–100.0)
MONO ABS: 0.6 10*3/uL (ref 0.1–1.0)
Monocytes Relative: 8 %
Neutro Abs: 5.8 10*3/uL (ref 1.7–7.7)
Neutrophils Relative %: 77 %
Platelet Count: 147 10*3/uL — ABNORMAL LOW (ref 150–400)
RBC: 2.95 MIL/uL — ABNORMAL LOW (ref 4.22–5.81)
RDW: 14.6 % (ref 11.5–15.5)
WBC: 7.4 10*3/uL (ref 4.0–10.5)
nRBC: 0 % (ref 0.0–0.2)

## 2018-08-23 LAB — CMP (CANCER CENTER ONLY)
ALT: 10 U/L (ref 0–44)
ANION GAP: 10 (ref 5–15)
AST: 14 U/L — ABNORMAL LOW (ref 15–41)
Albumin: 3.7 g/dL (ref 3.5–5.0)
Alkaline Phosphatase: 94 U/L (ref 38–126)
BUN: 24 mg/dL — ABNORMAL HIGH (ref 8–23)
CO2: 29 mmol/L (ref 22–32)
Calcium: 9.7 mg/dL (ref 8.9–10.3)
Chloride: 101 mmol/L (ref 98–111)
Creatinine: 1.25 mg/dL — ABNORMAL HIGH (ref 0.61–1.24)
GFR, Est AFR Am: >60 mL/min (ref >60)
GFR, Estimated: 54 mL/min — ABNORMAL LOW (ref >60)
Glucose, Bld: 182 mg/dL — ABNORMAL HIGH (ref 70–99)
POTASSIUM: 4.6 mmol/L (ref 3.5–5.1)
Sodium: 140 mmol/L (ref 135–145)
Total Bilirubin: 0.7 mg/dL (ref 0.3–1.2)
Total Protein: 7.4 g/dL (ref 6.5–8.1)

## 2018-08-23 LAB — TSH: TSH: 1.142 u[IU]/mL (ref 0.320–4.118)

## 2018-08-23 MED ORDER — SODIUM CHLORIDE 0.9 % IV SOLN
1200.0000 mg | Freq: Once | INTRAVENOUS | Status: AC
  Administered 2018-08-23: 1200 mg via INTRAVENOUS
  Filled 2018-08-23: qty 20

## 2018-08-23 MED ORDER — SODIUM CHLORIDE 0.9 % IV SOLN
Freq: Once | INTRAVENOUS | Status: AC
  Administered 2018-08-23: 12:00:00 via INTRAVENOUS
  Filled 2018-08-23: qty 250

## 2018-08-23 NOTE — Telephone Encounter (Signed)
Per 02/04 los, appt was already scheduled.  Printed calendar and avs.

## 2018-08-23 NOTE — Progress Notes (Signed)
[facility] [phone]   [phone]  OFFICE PROGRESS NOTE  Arthur Housekeeper, MD [place] [address]  DIAGNOSIS: Extensive stage (T2b, N3, M1c) small cell lung cancer, presented with large right upper lobe lung mass with extension to the right hilum in addition to the external and supraclavicular lymphadenopathy as well as metastatic disease to the retroperitoneal lymph nodes and left adrenal gland and suspicious right scapular bone lesion diagnosed in August 2019  PRIOR THERAPY: Systemic chemotherapy with carboplatin for AUC of 5 on day 1, etoposide 100 mg/M2 on days 1, 2 and 3 as well as Tecentriq 1200 mg IV every 3 weeks with Neulasta support.  First dose 03/28/2018.  Status post 6 cycles.   CURRENT THERAPY: Maintenance treatment with single agent Tecentriq 1200 mg IV every 3 weeks.  First dose today August 02, 2018.  Status post 1 cycle.  INTERVAL HISTORY: Keona Sheffler III 78 y.o. male returns to the clinic today for follow-up visit accompanied by his brother.  The patient is feeling fine today with no concerning complaints except for mild fatigue.  He tolerated the first cycle of maintenance treatment with Tecentriq fairly well.  He denied having any chest pain, shortness of breath, cough or hemoptysis.  He has no weight loss or night sweats.  He has no nausea, vomiting, diarrhea or constipation.  He is here today for evaluation before starting cycle #2 of his treatment.  MEDICAL HISTORY: Past Medical History:  Diagnosis Date  . Arthritis   . Ascending aortic aneurysm (HCC)    PER CT CHEST 03-08-18 IN [facility] , 4.0CM  . Chronic kidney disease   . Claudication ([place])   . Coronary artery disease 1997   CFX PCI, '97. Mod residual RI and RCA dis.  . Diabetes mellitus ([facility])    IDDM  . Dyslipidemia   . Dyspnea   . GERD (gastroesophageal reflux disease)   . Hard of hearing   . History of bronchitis   . History of chemotherapy   .  History of kidney stones 1958  . HTN (hypertension)   . Lacunar stroke ([facility])   . Metastatic lung cancer (metastasis from lung to other site), unspecified laterality ([facility])    DIAGNOSED 02-2018, MANAGED BY DR Veblen  . NSTEMI (non-ST elevated myocardial infarction) ([facility])   . PAD (peripheral artery disease) ([facility])   . Pneumonia   . Tobacco abuse   . Urinary incontinence   . Wears glasses     ALLERGIES:  is allergic to codeine.  MEDICATIONS:  Current Outpatient Medications  Medication Sig Dispense Refill  . aspirin EC 81 MG EC tablet Take 1 tablet (81 mg total) by mouth daily. 30 tablet 1  . atorvastatin (LIPITOR) 20 MG tablet TAKE 1 TABLET (20 MG TOTAL) BY MOUTH DAILY. (Patient taking differently: Take 20 mg by mouth daily at 6 PM. ) 90 tablet 2  . clopidogrel (PLAVIX) 75 MG tablet Take 1 tablet (75 mg total) by mouth daily. 30 tablet 1  . docusate sodium (COLACE) 100 MG capsule Take 100 mg by mouth 2 (two) times daily.    Marland Kitchen doxazosin (CARDURA) 1 MG tablet Take 3 mg by mouth at bedtime. Pt takes 3 at bedtime     . furosemide (LASIX) 20 MG tablet Take 1 tablet (20 mg total) by mouth daily. 30 tablet 0  . glucose blood (ONETOUCH VERIO) test strip CHECK BLOOD SUGARS 3 DAILY:DX 250.40 One touch  verio    . insulin lispro (HUMALOG) 100 UNIT/ML injection Inject 0.35 mLs (35 Units total) into the skin 2 (two) times daily. (Patient taking differently: Inject 25-40 Units into the skin 2 (two) times daily at 10 AM and 5 PM. ) 10 mL 11  . isosorbide mononitrate (IMDUR) 30 MG 24 hr tablet Take 1 tablet (30 mg total) by mouth daily. 90 tablet 1  . meloxicam (MOBIC) 7.5 MG tablet Take 7.5 mg by mouth daily.    . metoprolol tartrate (LOPRESSOR) 50 MG tablet Take 1 tablet (50 mg total) by mouth 2 (two) times daily. 180 tablet 3  . Misc. Devices MISC 1 wheelchair for mobility.    . Nutritional Supplements (FEEDING SUPPLEMENT, GLUCERNA 1.2 CAL,) LIQD Take as directed by physician    .  pantoprazole (PROTONIX) 40 MG tablet Take 1 tablet (40 mg total) by mouth daily. 30 tablet 1  . polyethylene glycol (MIRALAX / GLYCOLAX) packet Take 17 g by mouth 2 (two) times daily.    . prochlorperazine (COMPAZINE) 10 MG tablet Take 1 tablet (10 mg total) by mouth every 6 (six) hours as needed for nausea or vomiting. 30 tablet 0  . senna-docusate (SENOKOT-S) 8.6-50 MG tablet Take 1 tablet by mouth 2 (two) times daily. While taking strong pain meds to prevent constipation 20 tablet 0  . Sennosides (EX-LAX PO) Take 2 tablets by mouth daily as needed (constipation).    Marland Kitchen amLODipine (NORVASC) 5 MG tablet Take 1 tablet (5 mg total) by mouth daily. 180 tablet 3  . nitroGLYCERIN (NITROSTAT) 0.4 MG SL tablet Place 1 tablet (0.4 mg total) under the tongue every 5 (five) minutes x 3 doses as needed for chest pain. (Patient not taking: Reported on 08/23/2018) 25 tablet 4   No current facility-administered medications for this visit.    Facility-Administered Medications Ordered in Other Visits  Medication Dose Route Frequency Provider Last Rate Last Dose  . sodium chloride flush (NS) 0.9 % injection 10 mL  10 mL Intracatheter PRN Curt Bears, MD        SURGICAL HISTORY:  Past Surgical History:  Procedure Laterality Date  . APPENDECTOMY     "busted on me"  . COLONOSCOPY    . CORONARY ANGIOPLASTY  1997   CFX  . CYSTOSCOPY WITH RETROGRADE PYELOGRAM, URETEROSCOPY AND STENT PLACEMENT Right 04/13/2018   Procedure: CYSTOSCOPY WITH RETROGRADE PYELOGRAM, URETEROSCOPY AND STENT PLACEMENT;  Surgeon: Alexis Frock, MD;  Location: [facility];  Service: Urology;  Laterality: Right;  1 HR  . ENDARTERECTOMY FEMORAL Right 05/13/2016   iliofemoral endarterectomy with bovine pericardial patch angioplasty  . ENDARTERECTOMY FEMORAL Right 05/13/2016   Procedure: RIGHT ENDARTERECTOMY FEMORAL;  Surgeon: Serafina Mitchell, MD;  Location: [facility];  Service: Vascular;  Laterality: Right;  . HOLMIUM LASER APPLICATION Right  6/96/7893   Procedure: HOLMIUM LASER APPLICATION;  Surgeon: Alexis Frock, MD;  Location: [facility];  Service: Urology;  Laterality: Right;  . PATCH ANGIOPLASTY Right 05/13/2016   Procedure: [place];  Surgeon: Serafina Mitchell, MD;  Location: [facility];  Service: Vascular;  Laterality: Right;  . PERIPHERAL VASCULAR CATHETERIZATION N/A 02/20/2016   Procedure: Lower Extremity Angiography;  Surgeon: Lorretta Harp, MD;  Location: [facility];  Service: Cardiovascular;  Laterality: N/A;    REVIEW OF SYSTEMS:  A comprehensive review of systems was negative except for: Constitutional: positive for fatigue   PHYSICAL EXAMINATION: General appearance: alert, cooperative, fatigued and no distress Head: Normocephalic, without  obvious abnormality, atraumatic Neck: no adenopathy, no JVD, supple, symmetrical, trachea midline and thyroid not enlarged, symmetric, no tenderness/mass/nodules Lymph nodes: Cervical, supraclavicular, and axillary nodes normal. Resp: clear to auscultation bilaterally Back: symmetric, no curvature. ROM normal. No CVA tenderness. Cardio: regular rate and rhythm, S1, S2 normal, no murmur, click, rub or gallop GI: soft, non-tender; bowel sounds normal; no masses,  no organomegaly Extremities: extremities normal, atraumatic, no cyanosis or edema  ECOG PERFORMANCE STATUS: 1 - Symptomatic but completely ambulatory  Blood pressure 124/73, pulse 87, temperature 98.1 F (36.7 C), temperature source Oral, resp. rate 20, height 5\' 9"  (1.753 m), weight 164 lb 1.6 oz (74.4 kg), SpO2 100 %.  LABORATORY DATA: Lab Results  Component Value Date   WBC 7.4 08/23/2018   HGB 9.5 (L) 08/23/2018   HCT 30.3 (L) 08/23/2018   MCV 102.7 (H) 08/23/2018   PLT 147 (L) 08/23/2018      Chemistry      Component Value Date/Time   NA 140 08/23/2018 0956   NA 133 (L) 03/30/2018 0829   K 4.6 08/23/2018 0956   CL 101 08/23/2018 0956   CO2 29 08/23/2018 0956    BUN 24 (H) 08/23/2018 0956   BUN 31 (H) 03/30/2018 0829   CREATININE 1.25 (H) 08/23/2018 0956   CREATININE 1.46 (H) 02/27/2016 0820      Component Value Date/Time   CALCIUM 9.7 08/23/2018 0956   ALKPHOS 94 08/23/2018 0956   AST 14 (L) 08/23/2018 0956   ALT 10 08/23/2018 0956   BILITOT 0.7 08/23/2018 0956       RADIOGRAPHIC STUDIES: Ct Chest W Contrast  Result Date: 08/01/2018 CLINICAL DATA:  Metastatic small cell lung cancer, restaging and assessment of response to therapy. EXAM: CT CHEST, ABDOMEN, AND PELVIS WITH CONTRAST TECHNIQUE: Multidetector CT imaging of the chest, abdomen and pelvis was performed following the standard protocol during bolus administration of intravenous contrast. CONTRAST:  189mL OMNIPAQUE IOHEXOL 300 MG/ML  SOLN COMPARISON:  Multiple exams, including 06/14/2018 FINDINGS: CT CHEST FINDINGS Cardiovascular: Coronary, aortic arch, and branch vessel atherosclerotic vascular disease. Stable chronic mural thrombus in the descending thoracic aorta. Moderate pericardial effusion, stable. Mild left ventricular enlargement. Mediastinum/Nodes: 3.3 by 1.4 cm hypodense right thyroid nodule, stable, with other smaller bilateral thyroid nodules. These thyroid nodules have been negative on PET-CT, generally taken as a reliable indicator of benign etiology. Probable mucus in the trachea on image 16/2. No significant recurrent thoracic adenopathy. Lungs/Pleura: Moderate right pleural effusion likely with loculated component similar to previous, and with associated passive atelectasis. Azygos fissure noted. Musculoskeletal: Stable nonunited or partially united fracture of the right scapula. Old left posterolateral rib deformities. Small sclerotic lesion in the left humeral head is stable. Stable irregular mixed density and lucency in the right sternal body inferiorly. Stable sclerosis at T3, T9, and T10. CT ABDOMEN PELVIS FINDINGS Hepatobiliary: Numerous gallstones fill much of the  gallbladder. Mild gallbladder wall thickening similar to the prior exam. Otherwise unremarkable. Pancreas: Punctate calcification in the pancreatic head as on the prior exam. No significant pancreatic lesion observed. Spleen: Old granulomatous disease of the spleen. Adrenals/Urinary Tract: The adrenal glands appear normal. Small hypodense renal lesions are technically too small to characterize although statistically likely to be benign. The urinary bladder appears unremarkable. Stomach/Bowel: Descending and sigmoid colon diverticulosis. Vascular/Lymphatic: Aortoiliac atherosclerotic vascular disease. Reproductive: The prostate gland measures 5.3 by 4.5 by 6.0 cm (volume = 75 cm^3) and notably indents the bladder base. Other: No supplemental non-categorized findings. Musculoskeletal: Stable  sclerotic lesions at L1, L5, in the iliac bones, in the sacrum, the right ischium, and in the left intertrochanteric region of the femur. Spondylosis and degenerative disc disease at L4-5 causing central narrowing of the thecal sac. IMPRESSION: 1. Moderate right pleural effusion with mostly passive atelectasis in the right lung, roughly stable from the prior exam. 2. Stable appearance of the sclerotic metastatic disease involving the thoracic spine, lumbar spine, left humeral head, sternum, iliac bones, sacrum, right ischium, and left femoral intertrochanteric region. 3. No current appreciable adenopathy. 4. Stable moderate pericardial effusion. 5. Other imaging findings of potential clinical significance: Aortic Atherosclerosis (ICD10-I70.0). Coronary atherosclerosis. Left ventricular enlargement. Stable right thyroid nodule. Stable nonunited or partially united fracture of the right scapula. Cholelithiasis. Stable mild gallbladder wall thickening. Old granulomatous disease. Descending and sigmoid colon diverticulosis. Moderate prostatomegaly. Central narrowing of the thecal sac at L4-5. Electronically Signed   By: Van Clines M.D.   On: 08/01/2018 16:01   Ct Abdomen Pelvis W Contrast  Result Date: 08/01/2018 CLINICAL DATA:  Metastatic small cell lung cancer, restaging and assessment of response to therapy. EXAM: CT CHEST, ABDOMEN, AND PELVIS WITH CONTRAST TECHNIQUE: Multidetector CT imaging of the chest, abdomen and pelvis was performed following the standard protocol during bolus administration of intravenous contrast. CONTRAST:  119mL OMNIPAQUE IOHEXOL 300 MG/ML  SOLN COMPARISON:  Multiple exams, including 06/14/2018 FINDINGS: CT CHEST FINDINGS Cardiovascular: Coronary, aortic arch, and branch vessel atherosclerotic vascular disease. Stable chronic mural thrombus in the descending thoracic aorta. Moderate pericardial effusion, stable. Mild left ventricular enlargement. Mediastinum/Nodes: 3.3 by 1.4 cm hypodense right thyroid nodule, stable, with other smaller bilateral thyroid nodules. These thyroid nodules have been negative on PET-CT, generally taken as a reliable indicator of benign etiology. Probable mucus in the trachea on image 16/2. No significant recurrent thoracic adenopathy. Lungs/Pleura: Moderate right pleural effusion likely with loculated component similar to previous, and with associated passive atelectasis. Azygos fissure noted. Musculoskeletal: Stable nonunited or partially united fracture of the right scapula. Old left posterolateral rib deformities. Small sclerotic lesion in the left humeral head is stable. Stable irregular mixed density and lucency in the right sternal body inferiorly. Stable sclerosis at T3, T9, and T10. CT ABDOMEN PELVIS FINDINGS Hepatobiliary: Numerous gallstones fill much of the gallbladder. Mild gallbladder wall thickening similar to the prior exam. Otherwise unremarkable. Pancreas: Punctate calcification in the pancreatic head as on the prior exam. No significant pancreatic lesion observed. Spleen: Old granulomatous disease of the spleen. Adrenals/Urinary Tract: The adrenal  glands appear normal. Small hypodense renal lesions are technically too small to characterize although statistically likely to be benign. The urinary bladder appears unremarkable. Stomach/Bowel: Descending and sigmoid colon diverticulosis. Vascular/Lymphatic: Aortoiliac atherosclerotic vascular disease. Reproductive: The prostate gland measures 5.3 by 4.5 by 6.0 cm (volume = 75 cm^3) and notably indents the bladder base. Other: No supplemental non-categorized findings. Musculoskeletal: Stable sclerotic lesions at L1, L5, in the iliac bones, in the sacrum, the right ischium, and in the left intertrochanteric region of the femur. Spondylosis and degenerative disc disease at L4-5 causing central narrowing of the thecal sac. IMPRESSION: 1. Moderate right pleural effusion with mostly passive atelectasis in the right lung, roughly stable from the prior exam. 2. Stable appearance of the sclerotic metastatic disease involving the thoracic spine, lumbar spine, left humeral head, sternum, iliac bones, sacrum, right ischium, and left femoral intertrochanteric region. 3. No current appreciable adenopathy. 4. Stable moderate pericardial effusion. 5. Other imaging findings of potential clinical significance: Aortic Atherosclerosis (ICD10-I70.0).  Coronary atherosclerosis. Left ventricular enlargement. Stable right thyroid nodule. Stable nonunited or partially united fracture of the right scapula. Cholelithiasis. Stable mild gallbladder wall thickening. Old granulomatous disease. Descending and sigmoid colon diverticulosis. Moderate prostatomegaly. Central narrowing of the thecal sac at L4-5. Electronically Signed   By: Van Clines M.D.   On: 08/01/2018 16:01    ASSESSMENT AND PLAN: This is a very pleasant 78 years old white male recently diagnosed with extensive stage (T2b, N3, M1c) small cell lung cancer presented with right upper lobe lung mass in addition to right mediastinal and supraclavicular lymphadenopathy as  well as metastatic disease to the retroperitoneal lymph nodes, left adrenal gland as well as bone disease in the right scapula diagnosed in August 2019. The patient was started on systemic chemotherapy with carboplatin, etoposide and Tecentriq status post 6 cycles.  He had no evidence for disease progression after cycle #6.  The patient was started on maintenance treatment with single agent Tecentriq status post 1 cycle.  He tolerated the first cycle of his treatment well. I recommended for the patient to continue his current maintenance treatment and he will proceed with cycle #2 today. I will see the patient back for follow-up visit in 3 weeks for evaluation before starting cycle #3. He was advised to call immediately if he has any concerning symptoms in the interval. The patient voices understanding of current disease status and treatment options and is in agreement with the current care plan. All questions were answered. The patient knows to call the clinic with any problems, questions or concerns. We can certainly see the patient much sooner if necessary.  Disclaimer: This note was dictated with voice recognition software. Similar sounding words can inadvertently be transcribed and may not be corrected upon review.

## 2018-09-02 ENCOUNTER — Other Ambulatory Visit: Payer: Self-pay | Admitting: Cardiovascular Disease

## 2018-09-13 ENCOUNTER — Other Ambulatory Visit: Payer: Self-pay | Admitting: Medical Oncology

## 2018-09-13 ENCOUNTER — Encounter: Payer: Self-pay | Admitting: Internal Medicine

## 2018-09-13 ENCOUNTER — Telehealth: Payer: Self-pay | Admitting: Internal Medicine

## 2018-09-13 ENCOUNTER — Inpatient Hospital Stay (HOSPITAL_BASED_OUTPATIENT_CLINIC_OR_DEPARTMENT_OTHER): Payer: Medicare Other | Admitting: Internal Medicine

## 2018-09-13 ENCOUNTER — Inpatient Hospital Stay: Payer: Medicare Other

## 2018-09-13 VITALS — BP 135/68 | HR 99 | Temp 97.8°F | Resp 17 | Ht 69.0 in | Wt 161.4 lb

## 2018-09-13 DIAGNOSIS — C7972 Secondary malignant neoplasm of left adrenal gland: Secondary | ICD-10-CM | POA: Diagnosis not present

## 2018-09-13 DIAGNOSIS — C3481 Malignant neoplasm of overlapping sites of right bronchus and lung: Secondary | ICD-10-CM

## 2018-09-13 DIAGNOSIS — C778 Secondary and unspecified malignant neoplasm of lymph nodes of multiple regions: Secondary | ICD-10-CM | POA: Diagnosis not present

## 2018-09-13 DIAGNOSIS — C3491 Malignant neoplasm of unspecified part of right bronchus or lung: Secondary | ICD-10-CM

## 2018-09-13 DIAGNOSIS — I10 Essential (primary) hypertension: Secondary | ICD-10-CM

## 2018-09-13 DIAGNOSIS — Z5112 Encounter for antineoplastic immunotherapy: Secondary | ICD-10-CM

## 2018-09-13 DIAGNOSIS — R5382 Chronic fatigue, unspecified: Secondary | ICD-10-CM

## 2018-09-13 LAB — CBC WITH DIFFERENTIAL (CANCER CENTER ONLY)
Abs Immature Granulocytes: 0.01 10*3/uL (ref 0.00–0.07)
BASOS PCT: 1 %
Basophils Absolute: 0 10*3/uL (ref 0.0–0.1)
EOS ABS: 0.2 10*3/uL (ref 0.0–0.5)
Eosinophils Relative: 2 %
HCT: 33.4 % — ABNORMAL LOW (ref 39.0–52.0)
Hemoglobin: 10.6 g/dL — ABNORMAL LOW (ref 13.0–17.0)
Immature Granulocytes: 0 %
LYMPHS ABS: 1.2 10*3/uL (ref 0.7–4.0)
Lymphocytes Relative: 17 %
MCH: 31.4 pg (ref 26.0–34.0)
MCHC: 31.7 g/dL (ref 30.0–36.0)
MCV: 98.8 fL (ref 80.0–100.0)
Monocytes Absolute: 0.6 10*3/uL (ref 0.1–1.0)
Monocytes Relative: 8 %
Neutro Abs: 5.1 10*3/uL (ref 1.7–7.7)
Neutrophils Relative %: 72 %
PLATELETS: 123 10*3/uL — AB (ref 150–400)
RBC: 3.38 MIL/uL — ABNORMAL LOW (ref 4.22–5.81)
RDW: 13.6 % (ref 11.5–15.5)
WBC Count: 7 10*3/uL (ref 4.0–10.5)
nRBC: 0 % (ref 0.0–0.2)

## 2018-09-13 LAB — CMP (CANCER CENTER ONLY)
ALT: 14 U/L (ref 0–44)
AST: 16 U/L (ref 15–41)
Albumin: 3.7 g/dL (ref 3.5–5.0)
Alkaline Phosphatase: 93 U/L (ref 38–126)
Anion gap: 9 (ref 5–15)
BUN: 22 mg/dL (ref 8–23)
CALCIUM: 9.5 mg/dL (ref 8.9–10.3)
CO2: 28 mmol/L (ref 22–32)
Chloride: 101 mmol/L (ref 98–111)
Creatinine: 1.2 mg/dL (ref 0.61–1.24)
GFR, Est AFR Am: >60 mL/min (ref >60)
GFR, Estimated: 57 mL/min — ABNORMAL LOW (ref >60)
Glucose, Bld: 185 mg/dL — ABNORMAL HIGH (ref 70–99)
Potassium: 4.2 mmol/L (ref 3.5–5.1)
Sodium: 138 mmol/L (ref 135–145)
Total Bilirubin: 0.7 mg/dL (ref 0.3–1.2)
Total Protein: 7.3 g/dL (ref 6.5–8.1)

## 2018-09-13 LAB — TSH: TSH: 0.429 u[IU]/mL (ref 0.320–4.118)

## 2018-09-13 MED ORDER — SODIUM CHLORIDE 0.9 % IV SOLN
Freq: Once | INTRAVENOUS | Status: AC
  Administered 2018-09-13: 10:00:00 via INTRAVENOUS
  Filled 2018-09-13: qty 250

## 2018-09-13 MED ORDER — SODIUM CHLORIDE 0.9 % IV SOLN
1200.0000 mg | Freq: Once | INTRAVENOUS | Status: AC
  Administered 2018-09-13: 1200 mg via INTRAVENOUS
  Filled 2018-09-13: qty 20

## 2018-09-13 NOTE — Progress Notes (Signed)
[facility] [phone]   [phone]  OFFICE PROGRESS NOTE  Dione Housekeeper, MD [place] [address]  DIAGNOSIS: Extensive stage (T2b, N3, M1c) small cell lung cancer, presented with large right upper lobe lung mass with extension to the right hilum in addition to the external and supraclavicular lymphadenopathy as well as metastatic disease to the retroperitoneal lymph nodes and left adrenal gland and suspicious right scapular bone lesion diagnosed in August 2019  PRIOR THERAPY: Systemic chemotherapy with carboplatin for AUC of 5 on day 1, etoposide 100 mg/M2 on days 1, 2 and 3 as well as Tecentriq 1200 mg IV every 3 weeks with Neulasta support.  First dose 03/28/2018.  Status post 6 cycles.   CURRENT THERAPY: Maintenance treatment with single agent Tecentriq 1200 mg IV every 3 weeks.  First dose today August 02, 2018.  Status post 2 cycles.  INTERVAL HISTORY: Arthur [name] 78 y.o. male returns to the clinic today for follow-up visit accompanied by family member.  The patient is feeling fine today with no concerning complaints except for fatigue that was getting worse in the third week after treatment.  He has no significant chest pain, shortness of breath, cough or hemoptysis.  He denied having any nausea, vomiting, diarrhea or constipation.  He has no headache or visual changes.  He has no weight loss or night sweats.  The patient is here today for evaluation before starting cycle #3 of his maintenance therapy.  MEDICAL HISTORY: Past Medical History:  Diagnosis Date  . Arthritis   . Ascending aortic aneurysm (HCC)    PER CT CHEST 03-08-18 IN [facility] , 4.0CM  . Chronic kidney disease   . Claudication ([place])   . Coronary artery disease 1997   CFX PCI, '97. Mod residual RI and RCA dis.  . Diabetes mellitus ([place])    IDDM  . Dyslipidemia   . Dyspnea   . GERD (gastroesophageal reflux disease)   . Hard of hearing   . History of  bronchitis   . History of chemotherapy   . History of kidney stones 1958  . HTN (hypertension)   . Lacunar stroke ([place])   . Metastatic lung cancer (metastasis from lung to other site), unspecified laterality ([place])    DIAGNOSED 02-2018, MANAGED BY DR [place]  . NSTEMI (non-ST elevated myocardial infarction) ([place])   . PAD (peripheral artery disease) ([facility])   . Pneumonia   . Tobacco abuse   . Urinary incontinence   . Wears glasses     ALLERGIES:  is allergic to codeine.  MEDICATIONS:  Current Outpatient Medications  Medication Sig Dispense Refill  . amLODipine (NORVASC) 5 MG tablet Take 1 tablet (5 mg total) by mouth daily. 180 tablet 3  . aspirin EC 81 MG EC tablet Take 1 tablet (81 mg total) by mouth daily. 30 tablet 1  . atorvastatin (LIPITOR) 20 MG tablet TAKE 1 TABLET (20 MG TOTAL) BY MOUTH DAILY. 90 tablet 0  . clopidogrel (PLAVIX) 75 MG tablet Take 1 tablet (75 mg total) by mouth daily. 30 tablet 1  . docusate sodium (COLACE) 100 MG capsule Take 100 mg by mouth 2 (two) times daily.    Marland Kitchen doxazosin (CARDURA) 1 MG tablet Take 3 mg by mouth at bedtime. Pt takes 3 at bedtime     . furosemide (LASIX) 20 MG tablet Take 1 tablet (20 mg total) by mouth daily. 30 tablet 0  . glucose  blood (ONETOUCH VERIO) test strip CHECK BLOOD SUGARS 3 DAILY:DX 250.40 One touch verio    . insulin lispro (HUMALOG) 100 UNIT/ML injection Inject 0.35 mLs (35 Units total) into the skin 2 (two) times daily. (Patient taking differently: Inject 25-40 Units into the skin 2 (two) times daily at 10 AM and 5 PM. ) 10 mL 11  . isosorbide mononitrate (IMDUR) 30 MG 24 hr tablet Take 1 tablet (30 mg total) by mouth daily. 90 tablet 1  . meloxicam (MOBIC) 7.5 MG tablet Take 7.5 mg by mouth daily.    . metoprolol tartrate (LOPRESSOR) 50 MG tablet Take 1 tablet (50 mg total) by mouth 2 (two) times daily. 180 tablet 3  . Misc. Devices MISC 1 wheelchair for mobility.    . nitroGLYCERIN (NITROSTAT) 0.4 MG SL  tablet Place 1 tablet (0.4 mg total) under the tongue every 5 (five) minutes x 3 doses as needed for chest pain. (Patient not taking: Reported on 08/23/2018) 25 tablet 4  . Nutritional Supplements (FEEDING SUPPLEMENT, GLUCERNA 1.2 CAL,) LIQD Take as directed by physician    . pantoprazole (PROTONIX) 40 MG tablet Take 1 tablet (40 mg total) by mouth daily. 30 tablet 1  . polyethylene glycol (MIRALAX / GLYCOLAX) packet Take 17 g by mouth 2 (two) times daily.    . prochlorperazine (COMPAZINE) 10 MG tablet Take 1 tablet (10 mg total) by mouth every 6 (six) hours as needed for nausea or vomiting. 30 tablet 0  . senna-docusate (SENOKOT-S) 8.6-50 MG tablet Take 1 tablet by mouth 2 (two) times daily. While taking strong pain meds to prevent constipation 20 tablet 0  . Sennosides (EX-LAX PO) Take 2 tablets by mouth daily as needed (constipation).     No current facility-administered medications for this visit.    Facility-Administered Medications Ordered in Other Visits  Medication Dose Route Frequency Provider Last Rate Last Dose  . sodium chloride flush (NS) 0.9 % injection 10 mL  10 mL Intracatheter PRN Curt Bears, MD        SURGICAL HISTORY:  Past Surgical History:  Procedure Laterality Date  . APPENDECTOMY     "busted on me"  . COLONOSCOPY    . CORONARY ANGIOPLASTY  1997   CFX  . CYSTOSCOPY WITH RETROGRADE PYELOGRAM, URETEROSCOPY AND STENT PLACEMENT Right 04/13/2018   Procedure: CYSTOSCOPY WITH RETROGRADE PYELOGRAM, URETEROSCOPY AND STENT PLACEMENT;  Surgeon: Alexis Frock, MD;  Location: [facility];  Service: Urology;  Laterality: Right;  1 HR  . ENDARTERECTOMY FEMORAL Right 05/13/2016   iliofemoral endarterectomy with bovine pericardial patch angioplasty  . ENDARTERECTOMY FEMORAL Right 05/13/2016   Procedure: RIGHT ENDARTERECTOMY FEMORAL;  Surgeon: Serafina Mitchell, MD;  Location: [facility];  Service: Vascular;  Laterality: Right;  . HOLMIUM LASER APPLICATION Right 7/85/8850   Procedure:  HOLMIUM LASER APPLICATION;  Surgeon: Alexis Frock, MD;  Location: [facility];  Service: Urology;  Laterality: Right;  . PATCH ANGIOPLASTY Right 05/13/2016   Procedure: [place];  Surgeon: Serafina Mitchell, MD;  Location: [facility];  Service: Vascular;  Laterality: Right;  . PERIPHERAL VASCULAR CATHETERIZATION N/A 02/20/2016   Procedure: Lower Extremity Angiography;  Surgeon: Lorretta Harp, MD;  Location: [facility];  Service: Cardiovascular;  Laterality: N/A;    REVIEW OF SYSTEMS:  A comprehensive review of systems was negative except for: Constitutional: positive for fatigue   PHYSICAL EXAMINATION: General appearance: alert, cooperative, fatigued and no distress Head: Normocephalic, without obvious abnormality, atraumatic Neck: no adenopathy,  no JVD, supple, symmetrical, trachea midline and thyroid not enlarged, symmetric, no tenderness/mass/nodules Lymph nodes: Cervical, supraclavicular, and axillary nodes normal. Resp: clear to auscultation bilaterally Back: symmetric, no curvature. ROM normal. No CVA tenderness. Cardio: regular rate and rhythm, S1, S2 normal, no murmur, click, rub or gallop GI: soft, non-tender; bowel sounds normal; no masses,  no organomegaly Extremities: extremities normal, atraumatic, no cyanosis or edema  ECOG PERFORMANCE STATUS: 1 - Symptomatic but completely ambulatory  Blood pressure 135/68, pulse 99, temperature 97.8 F (36.6 C), temperature source Oral, resp. rate 17, height 5\' 9"  (1.753 m), weight 161 lb 6.4 oz (73.2 kg), SpO2 96 %.  LABORATORY DATA: Lab Results  Component Value Date   WBC 7.0 09/13/2018   HGB 10.6 (L) 09/13/2018   HCT 33.4 (L) 09/13/2018   MCV 98.8 09/13/2018   PLT 123 (L) 09/13/2018      Chemistry      Component Value Date/Time   NA 140 08/23/2018 0956   NA 133 (L) 03/30/2018 0829   K 4.6 08/23/2018 0956   CL 101 08/23/2018 0956   CO2 29 08/23/2018 0956   BUN 24 (H) 08/23/2018 0956    BUN 31 (H) 03/30/2018 0829   CREATININE 1.25 (H) 08/23/2018 0956   CREATININE 1.46 (H) 02/27/2016 0820      Component Value Date/Time   CALCIUM 9.7 08/23/2018 0956   ALKPHOS 94 08/23/2018 0956   AST 14 (L) 08/23/2018 0956   ALT 10 08/23/2018 0956   BILITOT 0.7 08/23/2018 0956       RADIOGRAPHIC STUDIES: No results found.  ASSESSMENT AND PLAN: This is a very pleasant 78 years old white male recently diagnosed with extensive stage (T2b, N3, M1c) small cell lung cancer presented with right upper lobe lung mass in addition to right mediastinal and supraclavicular lymphadenopathy as well as metastatic disease to the retroperitoneal lymph nodes, left adrenal gland as well as bone disease in the right scapula diagnosed in August 2019. The patient was started on systemic chemotherapy with carboplatin, etoposide and Tecentriq status post 6 cycles.  He had no evidence for disease progression after cycle #6.  The patient was started on maintenance treatment with single agent Tecentriq status post 2 cycles.   The patient is tolerating this treatment well with no concerning adverse effects. I recommended for him to proceed with cycle #3 today as scheduled. I will see him back for follow-up visit in 3 weeks for evaluation after repeating CT scan of the chest, abdomen and pelvis for restaging of his disease. The patient was advised to call immediately if he has any concerning symptoms in the interval. The patient voices understanding of current disease status and treatment options and is in agreement with the current care plan. All questions were answered. The patient knows to call the clinic with any problems, questions or concerns. We can certainly see the patient much sooner if necessary.  Disclaimer: This note was dictated with voice recognition software. Similar sounding words can inadvertently be transcribed and may not be corrected upon review.

## 2018-09-13 NOTE — Patient Instructions (Signed)
[facility] Discharge Instructions for Patients Receiving Chemotherapy  Today you received the following chemotherapy agents: Tecentriq  To help prevent nausea and vomiting after your treatment, we encourage you to take your nausea medication as directed.   If you develop nausea and vomiting that is not controlled by your nausea medication, call the clinic.   BELOW ARE SYMPTOMS THAT SHOULD BE REPORTED IMMEDIATELY:  *FEVER GREATER THAN 100.5 F  *CHILLS WITH OR WITHOUT FEVER  NAUSEA AND VOMITING THAT IS NOT CONTROLLED WITH YOUR NAUSEA MEDICATION  *UNUSUAL SHORTNESS OF BREATH  *UNUSUAL BRUISING OR BLEEDING  TENDERNESS IN MOUTH AND THROAT WITH OR WITHOUT PRESENCE OF ULCERS  *URINARY PROBLEMS  *BOWEL PROBLEMS  UNUSUAL RASH Items with * indicate a potential emergency and should be followed up as soon as possible.  Feel free to call the clinic should you have any questions or concerns. The clinic phone number is [phone].  Please show the CHEMO ALERT CARD at check-in to the Emergency Department and triage nurse.   

## 2018-09-13 NOTE — Telephone Encounter (Signed)
Gave avs and calendar ° °

## 2018-09-18 ENCOUNTER — Other Ambulatory Visit: Payer: Self-pay | Admitting: Cardiology

## 2018-09-29 ENCOUNTER — Other Ambulatory Visit: Payer: Self-pay

## 2018-09-29 ENCOUNTER — Ambulatory Visit (HOSPITAL_COMMUNITY)
Admission: RE | Admit: 2018-09-29 | Discharge: 2018-09-29 | Disposition: A | Discharge status: Home / Self Care | Payer: Medicare Other | Source: Ambulatory Visit | Attending: Internal Medicine | Admitting: Internal Medicine

## 2018-09-29 DIAGNOSIS — C3491 Malignant neoplasm of unspecified part of right bronchus or lung: Secondary | ICD-10-CM | POA: Insufficient documentation

## 2018-09-29 MED ORDER — IOHEXOL 300 MG/ML  SOLN
100.0000 mL | Freq: Once | INTRAMUSCULAR | Status: AC | PRN
  Administered 2018-09-29: 100 mL via INTRAVENOUS

## 2018-09-29 MED ORDER — IOHEXOL 300 MG/ML  SOLN
30.0000 mL | Freq: Once | INTRAMUSCULAR | Status: AC | PRN
  Administered 2018-09-29: 30 mL via ORAL

## 2018-09-29 MED ORDER — SODIUM CHLORIDE (PF) 0.9 % IJ SOLN
INTRAMUSCULAR | Status: AC
  Filled 2018-09-29: qty 50

## 2018-09-30 ENCOUNTER — Other Ambulatory Visit: Payer: Medicare Other

## 2018-10-03 ENCOUNTER — Other Ambulatory Visit: Payer: Self-pay | Admitting: Cardiology

## 2018-10-04 ENCOUNTER — Telehealth: Payer: Self-pay | Admitting: Internal Medicine

## 2018-10-04 ENCOUNTER — Inpatient Hospital Stay: Payer: Medicare Other

## 2018-10-04 ENCOUNTER — Other Ambulatory Visit: Payer: Self-pay

## 2018-10-04 ENCOUNTER — Encounter: Payer: Self-pay | Admitting: Internal Medicine

## 2018-10-04 ENCOUNTER — Inpatient Hospital Stay: Payer: Medicare Other | Attending: Internal Medicine | Admitting: Internal Medicine

## 2018-10-04 VITALS — BP 127/68 | HR 86 | Temp 97.9°F | Resp 18 | Ht 69.0 in | Wt 162.7 lb

## 2018-10-04 DIAGNOSIS — I252 Old myocardial infarction: Secondary | ICD-10-CM | POA: Insufficient documentation

## 2018-10-04 DIAGNOSIS — K5730 Diverticulosis of large intestine without perforation or abscess without bleeding: Secondary | ICD-10-CM | POA: Insufficient documentation

## 2018-10-04 DIAGNOSIS — J439 Emphysema, unspecified: Secondary | ICD-10-CM | POA: Insufficient documentation

## 2018-10-04 DIAGNOSIS — Z5112 Encounter for antineoplastic immunotherapy: Secondary | ICD-10-CM | POA: Insufficient documentation

## 2018-10-04 DIAGNOSIS — M129 Arthropathy, unspecified: Secondary | ICD-10-CM | POA: Diagnosis not present

## 2018-10-04 DIAGNOSIS — Z79899 Other long term (current) drug therapy: Secondary | ICD-10-CM | POA: Insufficient documentation

## 2018-10-04 DIAGNOSIS — C7951 Secondary malignant neoplasm of bone: Secondary | ICD-10-CM | POA: Diagnosis not present

## 2018-10-04 DIAGNOSIS — I2510 Atherosclerotic heart disease of native coronary artery without angina pectoris: Secondary | ICD-10-CM | POA: Diagnosis not present

## 2018-10-04 DIAGNOSIS — K8689 Other specified diseases of pancreas: Secondary | ICD-10-CM | POA: Diagnosis not present

## 2018-10-04 DIAGNOSIS — R32 Unspecified urinary incontinence: Secondary | ICD-10-CM

## 2018-10-04 DIAGNOSIS — C3491 Malignant neoplasm of unspecified part of right bronchus or lung: Secondary | ICD-10-CM

## 2018-10-04 DIAGNOSIS — Z87442 Personal history of urinary calculi: Secondary | ICD-10-CM | POA: Insufficient documentation

## 2018-10-04 DIAGNOSIS — E785 Hyperlipidemia, unspecified: Secondary | ICD-10-CM | POA: Insufficient documentation

## 2018-10-04 DIAGNOSIS — C772 Secondary and unspecified malignant neoplasm of intra-abdominal lymph nodes: Secondary | ICD-10-CM | POA: Diagnosis not present

## 2018-10-04 DIAGNOSIS — N189 Chronic kidney disease, unspecified: Secondary | ICD-10-CM | POA: Insufficient documentation

## 2018-10-04 DIAGNOSIS — N400 Enlarged prostate without lower urinary tract symptoms: Secondary | ICD-10-CM

## 2018-10-04 DIAGNOSIS — R5382 Chronic fatigue, unspecified: Secondary | ICD-10-CM

## 2018-10-04 DIAGNOSIS — Z7982 Long term (current) use of aspirin: Secondary | ICD-10-CM | POA: Insufficient documentation

## 2018-10-04 DIAGNOSIS — I714 Abdominal aortic aneurysm, without rupture: Secondary | ICD-10-CM | POA: Diagnosis not present

## 2018-10-04 DIAGNOSIS — I700 Atherosclerosis of aorta: Secondary | ICD-10-CM | POA: Insufficient documentation

## 2018-10-04 DIAGNOSIS — E042 Nontoxic multinodular goiter: Secondary | ICD-10-CM | POA: Diagnosis not present

## 2018-10-04 DIAGNOSIS — F17210 Nicotine dependence, cigarettes, uncomplicated: Secondary | ICD-10-CM | POA: Diagnosis not present

## 2018-10-04 DIAGNOSIS — J90 Pleural effusion, not elsewhere classified: Secondary | ICD-10-CM | POA: Diagnosis not present

## 2018-10-04 DIAGNOSIS — Z8701 Personal history of pneumonia (recurrent): Secondary | ICD-10-CM | POA: Insufficient documentation

## 2018-10-04 DIAGNOSIS — I313 Pericardial effusion (noninflammatory): Secondary | ICD-10-CM | POA: Insufficient documentation

## 2018-10-04 DIAGNOSIS — E119 Type 2 diabetes mellitus without complications: Secondary | ICD-10-CM | POA: Insufficient documentation

## 2018-10-04 DIAGNOSIS — K219 Gastro-esophageal reflux disease without esophagitis: Secondary | ICD-10-CM | POA: Diagnosis not present

## 2018-10-04 DIAGNOSIS — K8020 Calculus of gallbladder without cholecystitis without obstruction: Secondary | ICD-10-CM | POA: Insufficient documentation

## 2018-10-04 DIAGNOSIS — C3481 Malignant neoplasm of overlapping sites of right bronchus and lung: Secondary | ICD-10-CM | POA: Diagnosis present

## 2018-10-04 DIAGNOSIS — I129 Hypertensive chronic kidney disease with stage 1 through stage 4 chronic kidney disease, or unspecified chronic kidney disease: Secondary | ICD-10-CM

## 2018-10-04 DIAGNOSIS — Z9221 Personal history of antineoplastic chemotherapy: Secondary | ICD-10-CM | POA: Insufficient documentation

## 2018-10-04 LAB — CMP (CANCER CENTER ONLY)
ALT: 15 U/L (ref 0–44)
AST: 15 U/L (ref 15–41)
Albumin: 3.6 g/dL (ref 3.5–5.0)
Alkaline Phosphatase: 86 U/L (ref 38–126)
Anion gap: 12 (ref 5–15)
BUN: 19 mg/dL (ref 8–23)
CO2: 25 mmol/L (ref 22–32)
Calcium: 9.5 mg/dL (ref 8.9–10.3)
Chloride: 101 mmol/L (ref 98–111)
Creatinine: 1.22 mg/dL (ref 0.61–1.24)
GFR, Est AFR Am: >60 mL/min (ref >60)
GFR, Estimated: 56 mL/min — ABNORMAL LOW (ref >60)
Glucose, Bld: 120 mg/dL — ABNORMAL HIGH (ref 70–99)
Potassium: 3.9 mmol/L (ref 3.5–5.1)
Sodium: 138 mmol/L (ref 135–145)
Total Bilirubin: 0.4 mg/dL (ref 0.3–1.2)
Total Protein: 7.3 g/dL (ref 6.5–8.1)

## 2018-10-04 LAB — CBC WITH DIFFERENTIAL (CANCER CENTER ONLY)
Abs Immature Granulocytes: 0.02 10*3/uL (ref 0.00–0.07)
Basophils Absolute: 0.1 10*3/uL (ref 0.0–0.1)
Basophils Relative: 1 %
EOS PCT: 1 %
Eosinophils Absolute: 0.1 10*3/uL (ref 0.0–0.5)
HEMATOCRIT: 33.8 % — AB (ref 39.0–52.0)
Hemoglobin: 10.9 g/dL — ABNORMAL LOW (ref 13.0–17.0)
Immature Granulocytes: 0 %
Lymphocytes Relative: 17 %
Lymphs Abs: 1.1 10*3/uL (ref 0.7–4.0)
MCH: 30.9 pg (ref 26.0–34.0)
MCHC: 32.2 g/dL (ref 30.0–36.0)
MCV: 95.8 fL (ref 80.0–100.0)
Monocytes Absolute: 0.6 10*3/uL (ref 0.1–1.0)
Monocytes Relative: 10 %
Neutro Abs: 4.6 10*3/uL (ref 1.7–7.7)
Neutrophils Relative %: 71 %
Platelet Count: 136 10*3/uL — ABNORMAL LOW (ref 150–400)
RBC: 3.53 MIL/uL — ABNORMAL LOW (ref 4.22–5.81)
RDW: 13.1 % (ref 11.5–15.5)
WBC Count: 6.5 10*3/uL (ref 4.0–10.5)
nRBC: 0 % (ref 0.0–0.2)

## 2018-10-04 LAB — TSH: TSH: 0.591 u[IU]/mL (ref 0.320–4.118)

## 2018-10-04 MED ORDER — SODIUM CHLORIDE 0.9 % IV SOLN
1200.0000 mg | Freq: Once | INTRAVENOUS | Status: AC
  Administered 2018-10-04: 1200 mg via INTRAVENOUS
  Filled 2018-10-04: qty 20

## 2018-10-04 MED ORDER — SODIUM CHLORIDE 0.9 % IV SOLN
Freq: Once | INTRAVENOUS | Status: AC
  Administered 2018-10-04: 10:00:00 via INTRAVENOUS
  Filled 2018-10-04: qty 250

## 2018-10-04 NOTE — Telephone Encounter (Signed)
Gave avs and calendar ° °

## 2018-10-04 NOTE — Progress Notes (Signed)
[place] [phone]   [phone]  OFFICE PROGRESS NOTE  Dione Housekeeper, MD [place] [address]  DIAGNOSIS: Extensive stage (T2b, N3, M1c) small cell lung cancer, presented with large right upper lobe lung mass with extension to the right hilum in addition to the external and supraclavicular lymphadenopathy as well as metastatic disease to the retroperitoneal lymph nodes and left adrenal gland and suspicious right scapular bone lesion diagnosed in August 2019  PRIOR THERAPY: Systemic chemotherapy with carboplatin for AUC of 5 on day 1, etoposide 100 mg/M2 on days 1, 2 and 3 as well as Tecentriq 1200 mg IV every 3 weeks with Neulasta support.  First dose 03/28/2018.  Status post 6 cycles.   CURRENT THERAPY: Maintenance treatment with single agent Tecentriq 1200 mg IV every 3 weeks.  First dose today August 02, 2018.  Status post 3 cycles.  INTERVAL HISTORY: Arthur [name] 78 y.o. male returns to the clinic today for follow-up visit.  The patient is feeling fine today with no concerning complaints.  He denied having any chest pain but has shortness of breath with exertion with no cough or hemoptysis.  He denied having any fever or chills.  He has no nausea, vomiting, diarrhea or constipation.  He denied having any skin rash or itching.  He has no weight loss or night sweats.  He has been tolerating his treatment with maintenance Tecentriq fairly well.  The patient is here today for evaluation with repeat CT scan of the chest, abdomen and pelvis for restaging of his disease.  MEDICAL HISTORY: Past Medical History:  Diagnosis Date   Arthritis    Ascending aortic aneurysm ([place])    PER CT CHEST 03-08-18 IN [facility] , 4.0CM   Chronic kidney disease    Claudication ([place])    Coronary artery disease 1997   CFX PCI, '97. Mod residual RI and RCA dis.   Diabetes mellitus ([place])    IDDM   Dyslipidemia    Dyspnea    GERD  (gastroesophageal reflux disease)    Hard of hearing    History of bronchitis    History of chemotherapy    History of kidney stones 1958   HTN (hypertension)    Lacunar stroke ([place])    Metastatic lung cancer (metastasis from lung to other site), unspecified laterality ([place])    DIAGNOSED 02-2018, MANAGED BY DR [place]   NSTEMI (non-ST elevated myocardial infarction) ([place])    PAD (peripheral artery disease) ([place])    Pneumonia    Tobacco abuse    Urinary incontinence    Wears glasses     ALLERGIES:  is allergic to codeine.  MEDICATIONS:  Current Outpatient Medications  Medication Sig Dispense Refill   amLODipine (NORVASC) 5 MG tablet Take 1 tablet (5 mg total) by mouth daily. 180 tablet 3   aspirin EC 81 MG EC tablet Take 1 tablet (81 mg total) by mouth daily. 30 tablet 1   atorvastatin (LIPITOR) 20 MG tablet TAKE 1 TABLET (20 MG TOTAL) BY MOUTH DAILY. 90 tablet 0   clopidogrel (PLAVIX) 75 MG tablet Take 1 tablet (75 mg total) by mouth daily. 30 tablet 1   docusate sodium (COLACE) 100 MG capsule Take 100 mg by mouth 2 (two) times daily.     doxazosin (CARDURA) 1 MG tablet Take 3 mg by mouth at bedtime. Pt takes 3 at bedtime      furosemide (LASIX) 20 MG  tablet Take 1 tablet (20 mg total) by mouth daily. 30 tablet 0   glucose blood (ONETOUCH VERIO) test strip CHECK BLOOD SUGARS 3 DAILY:DX 250.40 One touch verio     insulin lispro (HUMALOG) 100 UNIT/ML injection Inject 0.35 mLs (35 Units total) into the skin 2 (two) times daily. (Patient taking differently: Inject 25-40 Units into the skin 2 (two) times daily at 10 AM and 5 PM. ) 10 mL 11   isosorbide mononitrate (IMDUR) 30 MG 24 hr tablet TAKE 1 TABLET BY MOUTH EVERY DAY 90 tablet 1   meloxicam (MOBIC) 7.5 MG tablet Take 7.5 mg by mouth daily.     metoprolol tartrate (LOPRESSOR) 50 MG tablet Take 1 tablet (50 mg total) by mouth 2 (two) times daily. 180 tablet 3   Misc. Devices MISC 1  wheelchair for mobility.     nitroGLYCERIN (NITROSTAT) 0.4 MG SL tablet Place 1 tablet (0.4 mg total) under the tongue every 5 (five) minutes x 3 doses as needed for chest pain. (Patient not taking: Reported on 08/23/2018) 25 tablet 4   Nutritional Supplements (FEEDING SUPPLEMENT, GLUCERNA 1.2 CAL,) LIQD Take as directed by physician     pantoprazole (PROTONIX) 40 MG tablet Take 1 tablet (40 mg total) by mouth daily. 30 tablet 1   polyethylene glycol (MIRALAX / GLYCOLAX) packet Take 17 g by mouth 2 (two) times daily.     prochlorperazine (COMPAZINE) 10 MG tablet Take 1 tablet (10 mg total) by mouth every 6 (six) hours as needed for nausea or vomiting. 30 tablet 0   senna-docusate (SENOKOT-S) 8.6-50 MG tablet Take 1 tablet by mouth 2 (two) times daily. While taking strong pain meds to prevent constipation 20 tablet 0   Sennosides (EX-LAX PO) Take 2 tablets by mouth daily as needed (constipation).     No current facility-administered medications for this visit.    Facility-Administered Medications Ordered in Other Visits  Medication Dose Route Frequency Provider Last Rate Last Dose   sodium chloride flush (NS) 0.9 % injection 10 mL  10 mL Intracatheter PRN Curt Bears, MD        SURGICAL HISTORY:  Past Surgical History:  Procedure Laterality Date   APPENDECTOMY     "busted on me"   COLONOSCOPY     CORONARY ANGIOPLASTY  1997   CFX   CYSTOSCOPY WITH RETROGRADE PYELOGRAM, URETEROSCOPY AND STENT PLACEMENT Right 04/13/2018   Procedure: CYSTOSCOPY WITH RETROGRADE PYELOGRAM, URETEROSCOPY AND STENT PLACEMENT;  Surgeon: Alexis Frock, MD;  Location: [facility];  Service: Urology;  Laterality: Right;  1 HR   ENDARTERECTOMY FEMORAL Right 05/13/2016   iliofemoral endarterectomy with bovine pericardial patch angioplasty   ENDARTERECTOMY FEMORAL Right 05/13/2016   Procedure: RIGHT ENDARTERECTOMY FEMORAL;  Surgeon: Serafina Mitchell, MD;  Location: [facility];  Service: Vascular;  Laterality:  Right;   HOLMIUM LASER APPLICATION Right 4/43/1540   Procedure: HOLMIUM LASER APPLICATION;  Surgeon: Alexis Frock, MD;  Location: [facility];  Service: Urology;  Laterality: Right;   PATCH ANGIOPLASTY Right 05/13/2016   Procedure: [place];  Surgeon: Serafina Mitchell, MD;  Location: [facility];  Service: Vascular;  Laterality: Right;   PERIPHERAL VASCULAR CATHETERIZATION N/A 02/20/2016   Procedure: Lower Extremity Angiography;  Surgeon: Lorretta Harp, MD;  Location: [facility];  Service: Cardiovascular;  Laterality: N/A;    REVIEW OF SYSTEMS:  Constitutional: positive for fatigue Eyes: negative Ears, nose, mouth, throat, and face: negative Respiratory: positive for cough Cardiovascular: negative Gastrointestinal:  negative Genitourinary:negative Integument/breast: negative Hematologic/lymphatic: negative Musculoskeletal:negative Neurological: negative Behavioral/Psych: negative Endocrine: negative Allergic/Immunologic: negative   PHYSICAL EXAMINATION: General appearance: alert, cooperative, fatigued and no distress Head: Normocephalic, without obvious abnormality, atraumatic Neck: no adenopathy, no JVD, supple, symmetrical, trachea midline and thyroid not enlarged, symmetric, no tenderness/mass/nodules Lymph nodes: Cervical, supraclavicular, and axillary nodes normal. Resp: clear to auscultation bilaterally Back: symmetric, no curvature. ROM normal. No CVA tenderness. Cardio: regular rate and rhythm, S1, S2 normal, no murmur, click, rub or gallop GI: soft, non-tender; bowel sounds normal; no masses,  no organomegaly Extremities: extremities normal, atraumatic, no cyanosis or edema Neurologic: Alert and oriented X 3, normal strength and tone. Normal symmetric reflexes. Normal coordination and gait  ECOG PERFORMANCE STATUS: 1 - Symptomatic but completely ambulatory  Blood pressure 127/68, pulse 86, temperature 97.9 F (36.6 C),  temperature source Oral, resp. rate 18, height 5\' 9"  (1.753 m), weight 162 lb 11.2 oz (73.8 kg), SpO2 98 %.  LABORATORY DATA: Lab Results  Component Value Date   WBC 6.5 10/04/2018   HGB 10.9 (L) 10/04/2018   HCT 33.8 (L) 10/04/2018   MCV 95.8 10/04/2018   PLT 136 (L) 10/04/2018      Chemistry      Component Value Date/Time   NA 138 09/13/2018 0902   NA 133 (L) 03/30/2018 0829   K 4.2 09/13/2018 0902   CL 101 09/13/2018 0902   CO2 28 09/13/2018 0902   BUN 22 09/13/2018 0902   BUN 31 (H) 03/30/2018 0829   CREATININE 1.20 09/13/2018 0902   CREATININE 1.46 (H) 02/27/2016 0820      Component Value Date/Time   CALCIUM 9.5 09/13/2018 0902   ALKPHOS 93 09/13/2018 0902   AST 16 09/13/2018 0902   ALT 14 09/13/2018 0902   BILITOT 0.7 09/13/2018 0902       RADIOGRAPHIC STUDIES: Ct Chest W Contrast  Result Date: 09/29/2018 CLINICAL DATA:  Small-cell lung cancer. Right upper lobe lung mass with extension to hilum. Diagnosed in August of 2019. Chemotherapy. Evaluate treatment response. EXAM: CT CHEST, ABDOMEN, AND PELVIS WITH CONTRAST TECHNIQUE: Multidetector CT imaging of the chest, abdomen and pelvis was performed following the standard protocol during bolus administration of intravenous contrast. CONTRAST:  160mL OMNIPAQUE IOHEXOL 300 MG/ML  SOLN COMPARISON:  08/01/2018 FINDINGS: CT CHEST FINDINGS Cardiovascular: Advanced aortic and branch vessel atherosclerosis. Ulcer plaque in the descending thoracic aorta. Mild cardiomegaly with similar small pericardial effusion. Multivessel coronary artery atherosclerosis. No central pulmonary embolism, on this non-dedicated study. Mediastinum/Nodes: Bilateral thyroid nodules are similar and of doubtful clinical significance. No mediastinal adenopathy. Right infrahilar node of 9 mm is borderline sized and not significantly changed. Esophageal fluid level on image 17/2. Lungs/Pleura: Right-sided pleural effusion is decreased in small. No left effusion.  Azygos fissure.  Probable secretions in the dependent trachea. Improved right lower lobe atelectasis. Musculoskeletal: Similar widespread sclerotic osseous metastasis. Remote fracture involving the right scapular body. Remote left rib fractures. CT ABDOMEN PELVIS FINDINGS Hepatobiliary: Normal liver. Multiple small gallstones without acute cholecystitis or biliary duct dilatation. Pancreas: Pancreatic atrophy, without acute inflammation or duct dilatation. Spleen: Old granulomatous disease in the spleen. Adrenals/Urinary Tract: Normal right adrenal gland. New left adrenal nodularity including at 1.7 cm on image 60/2. Bilateral too small to characterize renal lesions, without hydronephrosis. The left ureter is borderline prominent, without cause identified. There is left perinephric nodularity, consistent with metastatic disease. Example medially at 1.6 cm on image 79/2, new. Anteriorly at 7 mm on image 79/2. Median lobe  impression into the urinary bladder is unchanged and slightly asymmetric to the left. Stomach/Bowel: Normal stomach, without wall thickening. Extensive colonic diverticulosis. Normal small bowel. Vascular/Lymphatic: Aortic and branch vessel atherosclerosis. Left perinephric nodularity/adenopathy. No other abdominopelvic adenopathy identified. Reproductive: Moderate prostatomegaly. Other: No significant free fluid. Musculoskeletal: Similar widespread sclerotic osseous metastasis. A mild L1 superior endplate compression deformity at is without ventral canal encroachment and not significantly changed. IMPRESSION: 1. Disease progression, as evidenced by new left adrenal and perinephric nodularity indicative of soft tissue metastasis. 2. Similar osseous metastasis. 3. Decrease in right pleural effusion with improved atelectasis. 4. Aortic atherosclerosis (ICD10-I70.0), coronary artery atherosclerosis and emphysema (ICD10-J43.9). 5. Similar small pericardial effusion. 6. Cholelithiasis. 7. Esophageal air  fluid level suggests dysmotility or gastroesophageal reflux. Electronically Signed   By: Abigail Miyamoto M.D.   On: 09/29/2018 16:42   Ct Abdomen Pelvis W Contrast  Result Date: 09/29/2018 CLINICAL DATA:  Small-cell lung cancer. Right upper lobe lung mass with extension to hilum. Diagnosed in August of 2019. Chemotherapy. Evaluate treatment response. EXAM: CT CHEST, ABDOMEN, AND PELVIS WITH CONTRAST TECHNIQUE: Multidetector CT imaging of the chest, abdomen and pelvis was performed following the standard protocol during bolus administration of intravenous contrast. CONTRAST:  126mL OMNIPAQUE IOHEXOL 300 MG/ML  SOLN COMPARISON:  08/01/2018 FINDINGS: CT CHEST FINDINGS Cardiovascular: Advanced aortic and branch vessel atherosclerosis. Ulcer plaque in the descending thoracic aorta. Mild cardiomegaly with similar small pericardial effusion. Multivessel coronary artery atherosclerosis. No central pulmonary embolism, on this non-dedicated study. Mediastinum/Nodes: Bilateral thyroid nodules are similar and of doubtful clinical significance. No mediastinal adenopathy. Right infrahilar node of 9 mm is borderline sized and not significantly changed. Esophageal fluid level on image 17/2. Lungs/Pleura: Right-sided pleural effusion is decreased in small. No left effusion. Azygos fissure.  Probable secretions in the dependent trachea. Improved right lower lobe atelectasis. Musculoskeletal: Similar widespread sclerotic osseous metastasis. Remote fracture involving the right scapular body. Remote left rib fractures. CT ABDOMEN PELVIS FINDINGS Hepatobiliary: Normal liver. Multiple small gallstones without acute cholecystitis or biliary duct dilatation. Pancreas: Pancreatic atrophy, without acute inflammation or duct dilatation. Spleen: Old granulomatous disease in the spleen. Adrenals/Urinary Tract: Normal right adrenal gland. New left adrenal nodularity including at 1.7 cm on image 60/2. Bilateral too small to characterize renal  lesions, without hydronephrosis. The left ureter is borderline prominent, without cause identified. There is left perinephric nodularity, consistent with metastatic disease. Example medially at 1.6 cm on image 79/2, new. Anteriorly at 7 mm on image 79/2. Median lobe impression into the urinary bladder is unchanged and slightly asymmetric to the left. Stomach/Bowel: Normal stomach, without wall thickening. Extensive colonic diverticulosis. Normal small bowel. Vascular/Lymphatic: Aortic and branch vessel atherosclerosis. Left perinephric nodularity/adenopathy. No other abdominopelvic adenopathy identified. Reproductive: Moderate prostatomegaly. Other: No significant free fluid. Musculoskeletal: Similar widespread sclerotic osseous metastasis. A mild L1 superior endplate compression deformity at is without ventral canal encroachment and not significantly changed. IMPRESSION: 1. Disease progression, as evidenced by new left adrenal and perinephric nodularity indicative of soft tissue metastasis. 2. Similar osseous metastasis. 3. Decrease in right pleural effusion with improved atelectasis. 4. Aortic atherosclerosis (ICD10-I70.0), coronary artery atherosclerosis and emphysema (ICD10-J43.9). 5. Similar small pericardial effusion. 6. Cholelithiasis. 7. Esophageal air fluid level suggests dysmotility or gastroesophageal reflux. Electronically Signed   By: Abigail Miyamoto M.D.   On: 09/29/2018 16:42    ASSESSMENT AND PLAN: This is a very pleasant 78 years old white male recently diagnosed with extensive stage (T2b, N3, M1c) small cell lung cancer presented with  right upper lobe lung mass in addition to right mediastinal and supraclavicular lymphadenopathy as well as metastatic disease to the retroperitoneal lymph nodes, left adrenal gland as well as bone disease in the right scapula diagnosed in August 2019. The patient was started on systemic chemotherapy with carboplatin, etoposide and Tecentriq status post 6 cycles.  He  had no evidence for disease progression after cycle #6.  The patient was started on maintenance treatment with single agent Tecentriq status post 3 cycles.   The patient has been tolerating this treatment well with no concerning adverse effects. He had repeat CT scan of the chest, abdomen and pelvis performed recently.  I personally and independently reviewed the scan images and discussed the results with the patient today. His scan showed stable disease in the chest as well as decrease in the right pleural effusion but there was some evidence for disease progression with new lesion in the left adrenal gland and perinephric nodularity. I discussed with the patient several options for management of his condition including palliative care and hospice referral versus switching treatment to a different kind of chemotherapy which the patient declined versus continuing the current treatment with Tecentriq but referring the patient to radiation oncology for local treatment of the new metastatic lesion.  The patient agreed to the last option with palliative radiation and continuing his immunotherapy. He will proceed with cycle #4 of his treatment with Tecentriq today. I will see him back for follow-up visit in 3 weeks for evaluation before starting cycle #5. The patient was advised to call immediately if he has any concerning symptoms in the interval. The patient voices understanding of current disease status and treatment options and is in agreement with the current care plan. All questions were answered. The patient knows to call the clinic with any problems, questions or concerns. We can certainly see the patient much sooner if necessary.  Disclaimer: This note was dictated with voice recognition software. Similar sounding words can inadvertently be transcribed and may not be corrected upon review.

## 2018-10-04 NOTE — Patient Instructions (Signed)
[facility] Discharge Instructions for Patients Receiving Chemotherapy  Today you received the following chemotherapy agents: Tecentriq  To help prevent nausea and vomiting after your treatment, we encourage you to take your nausea medication as directed.   If you develop nausea and vomiting that is not controlled by your nausea medication, call the clinic.   BELOW ARE SYMPTOMS THAT SHOULD BE REPORTED IMMEDIATELY:  *FEVER GREATER THAN 100.5 F  *CHILLS WITH OR WITHOUT FEVER  NAUSEA AND VOMITING THAT IS NOT CONTROLLED WITH YOUR NAUSEA MEDICATION  *UNUSUAL SHORTNESS OF BREATH  *UNUSUAL BRUISING OR BLEEDING  TENDERNESS IN MOUTH AND THROAT WITH OR WITHOUT PRESENCE OF ULCERS  *URINARY PROBLEMS  *BOWEL PROBLEMS  UNUSUAL RASH Items with * indicate a potential emergency and should be followed up as soon as possible.  Feel free to call the clinic should you have any questions or concerns. The clinic phone number is [phone].  Please show the CHEMO ALERT CARD at check-in to the Emergency Department and triage nurse.   

## 2018-10-18 ENCOUNTER — Inpatient Hospital Stay (HOSPITAL_COMMUNITY): Payer: Medicare Other

## 2018-10-18 ENCOUNTER — Emergency Department (HOSPITAL_COMMUNITY): Payer: Medicare Other

## 2018-10-18 ENCOUNTER — Other Ambulatory Visit: Payer: Self-pay

## 2018-10-18 ENCOUNTER — Inpatient Hospital Stay (HOSPITAL_COMMUNITY)
Admission: EM | Admit: 2018-10-18 | Discharge: 2018-11-18 | DRG: 291 | Disposition: E | Payer: Medicare Other | Attending: Pulmonary Disease | Admitting: Pulmonary Disease

## 2018-10-18 DIAGNOSIS — N184 Chronic kidney disease, stage 4 (severe): Secondary | ICD-10-CM | POA: Diagnosis present

## 2018-10-18 DIAGNOSIS — J90 Pleural effusion, not elsewhere classified: Secondary | ICD-10-CM | POA: Diagnosis present

## 2018-10-18 DIAGNOSIS — C786 Secondary malignant neoplasm of retroperitoneum and peritoneum: Secondary | ICD-10-CM | POA: Diagnosis present

## 2018-10-18 DIAGNOSIS — I712 Thoracic aortic aneurysm, without rupture: Secondary | ICD-10-CM | POA: Diagnosis present

## 2018-10-18 DIAGNOSIS — E871 Hypo-osmolality and hyponatremia: Secondary | ICD-10-CM | POA: Diagnosis not present

## 2018-10-18 DIAGNOSIS — Z515 Encounter for palliative care: Secondary | ICD-10-CM | POA: Diagnosis present

## 2018-10-18 DIAGNOSIS — R4020 Unspecified coma: Secondary | ICD-10-CM | POA: Diagnosis present

## 2018-10-18 DIAGNOSIS — G936 Cerebral edema: Secondary | ICD-10-CM | POA: Diagnosis present

## 2018-10-18 DIAGNOSIS — C3411 Malignant neoplasm of upper lobe, right bronchus or lung: Secondary | ICD-10-CM | POA: Diagnosis present

## 2018-10-18 DIAGNOSIS — Z7189 Other specified counseling: Secondary | ICD-10-CM

## 2018-10-18 DIAGNOSIS — J9690 Respiratory failure, unspecified, unspecified whether with hypoxia or hypercapnia: Secondary | ICD-10-CM

## 2018-10-18 DIAGNOSIS — K219 Gastro-esophageal reflux disease without esophagitis: Secondary | ICD-10-CM | POA: Diagnosis present

## 2018-10-18 DIAGNOSIS — C7931 Secondary malignant neoplasm of brain: Secondary | ICD-10-CM | POA: Diagnosis not present

## 2018-10-18 DIAGNOSIS — R570 Cardiogenic shock: Principal | ICD-10-CM | POA: Diagnosis present

## 2018-10-18 DIAGNOSIS — I289 Disease of pulmonary vessels, unspecified: Secondary | ICD-10-CM | POA: Diagnosis present

## 2018-10-18 DIAGNOSIS — J9601 Acute respiratory failure with hypoxia: Secondary | ICD-10-CM | POA: Diagnosis present

## 2018-10-18 DIAGNOSIS — L8990 Pressure ulcer of unspecified site, unspecified stage: Secondary | ICD-10-CM

## 2018-10-18 DIAGNOSIS — N179 Acute kidney failure, unspecified: Secondary | ICD-10-CM | POA: Diagnosis present

## 2018-10-18 DIAGNOSIS — Z960 Presence of urogenital implants: Secondary | ICD-10-CM | POA: Diagnosis present

## 2018-10-18 DIAGNOSIS — J9600 Acute respiratory failure, unspecified whether with hypoxia or hypercapnia: Secondary | ICD-10-CM

## 2018-10-18 DIAGNOSIS — I469 Cardiac arrest, cause unspecified: Secondary | ICD-10-CM | POA: Diagnosis present

## 2018-10-18 DIAGNOSIS — Z7982 Long term (current) use of aspirin: Secondary | ICD-10-CM

## 2018-10-18 DIAGNOSIS — R591 Generalized enlarged lymph nodes: Secondary | ICD-10-CM | POA: Diagnosis present

## 2018-10-18 DIAGNOSIS — L89151 Pressure ulcer of sacral region, stage 1: Secondary | ICD-10-CM | POA: Diagnosis present

## 2018-10-18 DIAGNOSIS — I252 Old myocardial infarction: Secondary | ICD-10-CM | POA: Diagnosis not present

## 2018-10-18 DIAGNOSIS — I5043 Acute on chronic combined systolic (congestive) and diastolic (congestive) heart failure: Secondary | ICD-10-CM | POA: Diagnosis present

## 2018-10-18 DIAGNOSIS — L89896 Pressure-induced deep tissue damage of other site: Secondary | ICD-10-CM | POA: Diagnosis not present

## 2018-10-18 DIAGNOSIS — G931 Anoxic brain damage, not elsewhere classified: Secondary | ICD-10-CM | POA: Diagnosis present

## 2018-10-18 DIAGNOSIS — Z66 Do not resuscitate: Secondary | ICD-10-CM | POA: Diagnosis present

## 2018-10-18 DIAGNOSIS — Z7902 Long term (current) use of antithrombotics/antiplatelets: Secondary | ICD-10-CM

## 2018-10-18 DIAGNOSIS — G253 Myoclonus: Secondary | ICD-10-CM | POA: Diagnosis not present

## 2018-10-18 DIAGNOSIS — Z8673 Personal history of transient ischemic attack (TIA), and cerebral infarction without residual deficits: Secondary | ICD-10-CM

## 2018-10-18 DIAGNOSIS — E785 Hyperlipidemia, unspecified: Secondary | ICD-10-CM | POA: Diagnosis present

## 2018-10-18 DIAGNOSIS — I130 Hypertensive heart and chronic kidney disease with heart failure and stage 1 through stage 4 chronic kidney disease, or unspecified chronic kidney disease: Secondary | ICD-10-CM | POA: Diagnosis present

## 2018-10-18 DIAGNOSIS — I2510 Atherosclerotic heart disease of native coronary artery without angina pectoris: Secondary | ICD-10-CM | POA: Diagnosis present

## 2018-10-18 DIAGNOSIS — Z885 Allergy status to narcotic agent status: Secondary | ICD-10-CM

## 2018-10-18 DIAGNOSIS — Z87891 Personal history of nicotine dependence: Secondary | ICD-10-CM

## 2018-10-18 DIAGNOSIS — L89156 Pressure-induced deep tissue damage of sacral region: Secondary | ICD-10-CM | POA: Diagnosis present

## 2018-10-18 DIAGNOSIS — R34 Anuria and oliguria: Secondary | ICD-10-CM | POA: Diagnosis present

## 2018-10-18 DIAGNOSIS — Z794 Long term (current) use of insulin: Secondary | ICD-10-CM

## 2018-10-18 DIAGNOSIS — D539 Nutritional anemia, unspecified: Secondary | ICD-10-CM | POA: Diagnosis present

## 2018-10-18 DIAGNOSIS — E1151 Type 2 diabetes mellitus with diabetic peripheral angiopathy without gangrene: Secondary | ICD-10-CM | POA: Diagnosis present

## 2018-10-18 DIAGNOSIS — C7972 Secondary malignant neoplasm of left adrenal gland: Secondary | ICD-10-CM | POA: Diagnosis present

## 2018-10-18 DIAGNOSIS — Z9221 Personal history of antineoplastic chemotherapy: Secondary | ICD-10-CM

## 2018-10-18 DIAGNOSIS — Z833 Family history of diabetes mellitus: Secondary | ICD-10-CM

## 2018-10-18 DIAGNOSIS — Z79899 Other long term (current) drug therapy: Secondary | ICD-10-CM

## 2018-10-18 DIAGNOSIS — E872 Acidosis: Secondary | ICD-10-CM | POA: Diagnosis present

## 2018-10-18 DIAGNOSIS — E1122 Type 2 diabetes mellitus with diabetic chronic kidney disease: Secondary | ICD-10-CM | POA: Diagnosis present

## 2018-10-18 DIAGNOSIS — Z8249 Family history of ischemic heart disease and other diseases of the circulatory system: Secondary | ICD-10-CM

## 2018-10-18 LAB — LIPID PANEL
Cholesterol: 92 mg/dL (ref 0–200)
HDL: 31 mg/dL — ABNORMAL LOW (ref >40)
LDL CALC: 35 mg/dL (ref 0–99)
Total CHOL/HDL Ratio: 3 RATIO
Triglycerides: 132 mg/dL (ref <150)
VLDL: 26 mg/dL (ref 0–40)

## 2018-10-18 LAB — RESPIRATORY PANEL BY PCR
Adenovirus: NOT DETECTED
BORDETELLA PERTUSSIS-RVPCR: NOT DETECTED
CHLAMYDOPHILA PNEUMONIAE-RVPPCR: NOT DETECTED
Coronavirus 229E: NOT DETECTED
Coronavirus HKU1: NOT DETECTED
Coronavirus NL63: NOT DETECTED
Coronavirus OC43: NOT DETECTED
Influenza A: NOT DETECTED
Influenza B: NOT DETECTED
Metapneumovirus: NOT DETECTED
Mycoplasma pneumoniae: NOT DETECTED
Parainfluenza Virus 1: NOT DETECTED
Parainfluenza Virus 2: NOT DETECTED
Parainfluenza Virus 3: NOT DETECTED
Parainfluenza Virus 4: NOT DETECTED
RESPIRATORY SYNCYTIAL VIRUS-RVPPCR: NOT DETECTED
Rhinovirus / Enterovirus: NOT DETECTED

## 2018-10-18 LAB — CBG MONITORING, ED: Glucose-Capillary: 222 mg/dL — ABNORMAL HIGH (ref 70–99)

## 2018-10-18 LAB — MAGNESIUM: Magnesium: 1.8 mg/dL (ref 1.7–2.4)

## 2018-10-18 LAB — POCT I-STAT 7, (LYTES, BLD GAS, ICA,H+H)
Acid-base deficit: 12 mmol/L — ABNORMAL HIGH (ref 0.0–2.0)
Acid-base deficit: 8 mmol/L — ABNORMAL HIGH (ref 0.0–2.0)
BICARBONATE: 16.9 mmol/L — AB (ref 20.0–28.0)
Bicarbonate: 16.9 mmol/L — ABNORMAL LOW (ref 20.0–28.0)
Calcium, Ion: 1.18 mmol/L (ref 1.15–1.40)
Calcium, Ion: 1.18 mmol/L (ref 1.15–1.40)
HCT: 29 % — ABNORMAL LOW (ref 39.0–52.0)
HEMATOCRIT: 32 % — AB (ref 39.0–52.0)
Hemoglobin: 10.9 g/dL — ABNORMAL LOW (ref 13.0–17.0)
Hemoglobin: 9.9 g/dL — ABNORMAL LOW (ref 13.0–17.0)
O2 SAT: 100 %
O2 Saturation: 100 %
Patient temperature: 98.6
Potassium: 3.2 mmol/L — ABNORMAL LOW (ref 3.5–5.1)
Potassium: 3.7 mmol/L (ref 3.5–5.1)
Sodium: 139 mmol/L (ref 135–145)
Sodium: 137 mmol/L (ref 135–145)
TCO2: 18 mmol/L — AB (ref 22–32)
TCO2: 18 mmol/L — ABNORMAL LOW (ref 22–32)
pCO2 arterial: 48.5 mmHg — ABNORMAL HIGH (ref 32.0–48.0)
pCO2 arterial: 31.8 mmHg — ABNORMAL LOW (ref 32.0–48.0)
pH, Arterial: 7.149 — CL (ref 7.350–7.450)
pH, Arterial: 7.335 — ABNORMAL LOW (ref 7.350–7.450)
pO2, Arterial: 277 mmHg — ABNORMAL HIGH (ref 83.0–108.0)
pO2, Arterial: 181 mmHg — ABNORMAL HIGH (ref 83.0–108.0)

## 2018-10-18 LAB — CBC WITH DIFFERENTIAL/PLATELET
ABS IMMATURE GRANULOCYTES: 0.32 10*3/uL — AB (ref 0.00–0.07)
Basophils Absolute: 0 10*3/uL (ref 0.0–0.1)
Basophils Relative: 0 %
Eosinophils Absolute: 0.1 10*3/uL (ref 0.0–0.5)
Eosinophils Relative: 1 %
HCT: 36.6 % — ABNORMAL LOW (ref 39.0–52.0)
HEMOGLOBIN: 10.8 g/dL — AB (ref 13.0–17.0)
Immature Granulocytes: 3 %
LYMPHS PCT: 36 %
Lymphs Abs: 3.8 10*3/uL (ref 0.7–4.0)
MCH: 30 pg (ref 26.0–34.0)
MCHC: 29.5 g/dL — ABNORMAL LOW (ref 30.0–36.0)
MCV: 101.7 fL — ABNORMAL HIGH (ref 80.0–100.0)
MONO ABS: 0.4 10*3/uL (ref 0.1–1.0)
Monocytes Relative: 4 %
Neutro Abs: 6 10*3/uL (ref 1.7–7.7)
Neutrophils Relative %: 56 %
PLATELETS: 125 10*3/uL — AB (ref 150–400)
RBC: 3.6 MIL/uL — ABNORMAL LOW (ref 4.22–5.81)
RDW: 12.7 % (ref 11.5–15.5)
WBC: 10.6 10*3/uL — AB (ref 4.0–10.5)
nRBC: 0 % (ref 0.0–0.2)

## 2018-10-18 LAB — COMPREHENSIVE METABOLIC PANEL
ALT: 32 U/L (ref 0–44)
AST: 64 U/L — ABNORMAL HIGH (ref 15–41)
Albumin: 3.2 g/dL — ABNORMAL LOW (ref 3.5–5.0)
Alkaline Phosphatase: 81 U/L (ref 38–126)
Anion gap: 23 — ABNORMAL HIGH (ref 5–15)
BUN: 20 mg/dL (ref 8–23)
CO2: 12 mmol/L — ABNORMAL LOW (ref 22–32)
Calcium: 9 mg/dL (ref 8.9–10.3)
Chloride: 103 mmol/L (ref 98–111)
Creatinine, Ser: 1.74 mg/dL — ABNORMAL HIGH (ref 0.61–1.24)
GFR calc non Af Amer: 36 mL/min — ABNORMAL LOW (ref >60)
GFR, EST AFRICAN AMERICAN: 42 mL/min — AB (ref >60)
Glucose, Bld: 281 mg/dL — ABNORMAL HIGH (ref 70–99)
Potassium: 4.3 mmol/L (ref 3.5–5.1)
SODIUM: 138 mmol/L (ref 135–145)
Total Bilirubin: 0.8 mg/dL (ref 0.3–1.2)
Total Protein: 6.3 g/dL — ABNORMAL LOW (ref 6.5–8.1)

## 2018-10-18 LAB — GLUCOSE, CAPILLARY
GLUCOSE-CAPILLARY: 174 mg/dL — AB (ref 70–99)
Glucose-Capillary: 220 mg/dL — ABNORMAL HIGH (ref 70–99)

## 2018-10-18 LAB — APTT: aPTT: 53 seconds — ABNORMAL HIGH (ref 24–36)

## 2018-10-18 LAB — PROTIME-INR
INR: 1.5 — ABNORMAL HIGH (ref 0.8–1.2)
Prothrombin Time: 18 seconds — ABNORMAL HIGH (ref 11.4–15.2)

## 2018-10-18 LAB — TROPONIN I: Troponin I: <0.03 ng/mL (ref <0.03)

## 2018-10-18 LAB — MRSA PCR SCREENING: MRSA by PCR: NEGATIVE

## 2018-10-18 LAB — STREP PNEUMONIAE URINARY ANTIGEN: Strep Pneumo Urinary Antigen: NEGATIVE

## 2018-10-18 LAB — LACTIC ACID, PLASMA
Lactic Acid, Venous: 9.3 mmol/L (ref 0.5–1.9)
Lactic Acid, Venous: 3.4 mmol/L (ref 0.5–1.9)

## 2018-10-18 LAB — PROCALCITONIN: Procalcitonin: 0.47 ng/mL

## 2018-10-18 MED ORDER — INSULIN ASPART 100 UNIT/ML ~~LOC~~ SOLN
0.0000 [IU] | SUBCUTANEOUS | Status: DC
  Administered 2018-10-18: 7 [IU] via SUBCUTANEOUS
  Administered 2018-10-18: 4 [IU] via SUBCUTANEOUS
  Administered 2018-10-19: 17:00:00 3 [IU] via SUBCUTANEOUS
  Administered 2018-10-19 (×4): 4 [IU] via SUBCUTANEOUS
  Administered 2018-10-19: 08:00:00 3 [IU] via SUBCUTANEOUS
  Administered 2018-10-20 (×2): 4 [IU] via SUBCUTANEOUS
  Administered 2018-10-20 (×2): 3 [IU] via SUBCUTANEOUS
  Administered 2018-10-20: 4 [IU] via SUBCUTANEOUS
  Administered 2018-10-20 – 2018-10-21 (×3): 3 [IU] via SUBCUTANEOUS

## 2018-10-18 MED ORDER — NOREPINEPHRINE 4 MG/250ML-% IV SOLN
2.0000 ug/min | INTRAVENOUS | Status: DC
  Administered 2018-10-18: 6 ug/min via INTRAVENOUS
  Administered 2018-10-18 – 2018-10-19 (×2): 2 ug/min via INTRAVENOUS
  Filled 2018-10-18 (×2): qty 250

## 2018-10-18 MED ORDER — SODIUM CHLORIDE 0.9 % IV SOLN
INTRAVENOUS | Status: DC
  Administered 2018-10-18: 10 mL/h via INTRAVENOUS

## 2018-10-18 MED ORDER — LEVETIRACETAM IN NACL 500 MG/100ML IV SOLN
500.0000 mg | Freq: Two times a day (BID) | INTRAVENOUS | Status: DC
  Administered 2018-10-18 – 2018-10-20 (×5): 500 mg via INTRAVENOUS
  Filled 2018-10-18 (×5): qty 100

## 2018-10-18 MED ORDER — PANTOPRAZOLE SODIUM 40 MG IV SOLR
40.0000 mg | INTRAVENOUS | Status: DC
  Administered 2018-10-18 – 2018-10-20 (×3): 40 mg via INTRAVENOUS
  Filled 2018-10-18 (×3): qty 40

## 2018-10-18 MED ORDER — SODIUM CHLORIDE 0.9 % IV SOLN
250.0000 mL | INTRAVENOUS | Status: DC
  Administered 2018-10-18: 250 mL via INTRAVENOUS

## 2018-10-18 MED ORDER — CHLORHEXIDINE GLUCONATE 0.12% ORAL RINSE (MEDLINE KIT)
15.0000 mL | Freq: Two times a day (BID) | OROMUCOSAL | Status: DC
  Administered 2018-10-18 – 2018-10-21 (×6): 15 mL via OROMUCOSAL

## 2018-10-18 MED ORDER — FENTANYL CITRATE (PF) 100 MCG/2ML IJ SOLN
50.0000 ug | INTRAMUSCULAR | Status: DC | PRN
  Administered 2018-10-18: 50 ug via INTRAVENOUS
  Filled 2018-10-18: qty 2

## 2018-10-18 MED ORDER — EPINEPHRINE PF 1 MG/ML IJ SOLN
0.5000 ug/min | INTRAVENOUS | Status: DC
  Administered 2018-10-18: 0.5 ug/min via INTRAVENOUS
  Filled 2018-10-18: qty 4

## 2018-10-18 MED ORDER — PROPOFOL 1000 MG/100ML IV EMUL
5.0000 ug/kg/min | INTRAVENOUS | Status: DC
  Administered 2018-10-18: 50 ug/kg/min via INTRAVENOUS
  Administered 2018-10-18: 5 ug/kg/min via INTRAVENOUS
  Administered 2018-10-19 (×5): 50 ug/kg/min via INTRAVENOUS
  Administered 2018-10-20: 08:00:00 30 ug/kg/min via INTRAVENOUS
  Administered 2018-10-20: 03:00:00 45 ug/kg/min via INTRAVENOUS
  Filled 2018-10-18 (×10): qty 100

## 2018-10-18 MED ORDER — MIDAZOLAM HCL 2 MG/2ML IJ SOLN
1.0000 mg | INTRAMUSCULAR | Status: DC | PRN
  Administered 2018-10-18 (×2): 1 mg via INTRAVENOUS
  Administered 2018-10-19 – 2018-10-21 (×5): 2 mg via INTRAVENOUS
  Filled 2018-10-18 (×6): qty 2

## 2018-10-18 MED ORDER — DEXAMETHASONE SODIUM PHOSPHATE 4 MG/ML IJ SOLN
4.0000 mg | Freq: Four times a day (QID) | INTRAMUSCULAR | Status: DC
  Administered 2018-10-18 – 2018-10-21 (×12): 4 mg via INTRAVENOUS
  Filled 2018-10-18 (×12): qty 1

## 2018-10-18 MED ORDER — ORAL CARE MOUTH RINSE
15.0000 mL | OROMUCOSAL | Status: DC
  Administered 2018-10-18 – 2018-10-21 (×25): 15 mL via OROMUCOSAL

## 2018-10-18 MED ORDER — MIDAZOLAM HCL 2 MG/2ML IJ SOLN: 1.0000 mg | INTRAMUSCULAR | Status: DC | PRN

## 2018-10-18 MED ORDER — SODIUM CHLORIDE 0.9 % IV BOLUS
1000.0000 mL | Freq: Once | INTRAVENOUS | Status: AC
  Administered 2018-10-18: 1000 mL via INTRAVENOUS

## 2018-10-18 MED ORDER — FENTANYL CITRATE (PF) 100 MCG/2ML IJ SOLN: 50.0000 ug | INTRAMUSCULAR | Status: DC | PRN

## 2018-10-18 MED ORDER — LEVETIRACETAM IN NACL 1500 MG/100ML IV SOLN
1500.0000 mg | Freq: Once | INTRAVENOUS | Status: AC
  Administered 2018-10-18: 1500 mg via INTRAVENOUS
  Filled 2018-10-18: qty 100

## 2018-10-18 MED ORDER — SODIUM CHLORIDE 0.9 % IV SOLN
INTRAVENOUS | Status: DC
  Administered 2018-10-18 – 2018-10-20 (×3): via INTRAVENOUS

## 2018-10-18 MED ORDER — MIDAZOLAM HCL 2 MG/2ML IJ SOLN
1.0000 mg | INTRAMUSCULAR | Status: DC | PRN
  Administered 2018-10-18: 1 mg via INTRAVENOUS
  Filled 2018-10-18: qty 2

## 2018-10-18 NOTE — Progress Notes (Signed)
Spoke with [facility] RN Verdis Frederickson and made her aware of patient having myoclonic jerks and asked her to relay this Dr. Ronnette Juniper which she stated she would.

## 2018-10-18 NOTE — ED Notes (Signed)
Pt CBG was 222, notified Dr. Vanita Panda

## 2018-10-18 NOTE — Progress Notes (Signed)
Pharmacy is unable to confirm the medications the patient was taking at home. All options have been exhausted and a resolution to the situation is not expected.   Where possible, their outpatient pharmacy(s) have been contacted for the last time prescriptions were filled and that information has been added to each medication in an Order Note (highlighted yellow below the medication).  Please contact pharmacy if further assistance is needed.   Romeo Rabon, PharmD. Mobile: [phone]. 09/20/2018,4:04 PM.

## 2018-10-18 NOTE — ED Triage Notes (Signed)
Pt in from home via [facility], per report EMS was called for DM by pt, upon arrival pt was unresponsive with unknown downtime, pt initially in asystole with x 3 epis given pta and CPR being completed for 10 mins, pt has bil # 18 tgo ACs, ET tube 7.5 at teeth, CBG 226, pt rcvd 2 mg Versed pta d/t biting at ET tube, upon arrival pt opens eyes spontaneously does not follow commands, bil 47mm pupils upon arrival

## 2018-10-18 NOTE — Progress Notes (Addendum)
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 Versed 1-2 mg IV Q 1 hour PRN sedation, seizures or anoxic myoclonus. 2. Increase ceiling on Propofol IV infusion to 80 mcg/kg/min.     Intervention Category Major Interventions: Other:  [name] 09/20/2018, 8:37 PM

## 2018-10-18 NOTE — ED Provider Notes (Signed)
[facility] EMERGENCY DEPARTMENT Provider Note   CSN: [id number] Arrival date & time:       History   Chief Complaint Chief Complaint  Patient presents with   Cardiac Arrest    HPI Arthur [name] is a 78 y.o. male.     HPI Patient presents in extremis via EMS after being found in asystole. Level 5 caveat secondary to acute condition. EMS reports that the patient called 911 with request for well visit check/glucose check. On EMS arrival the patient was found unresponsive, sitting in a recliner chair. Reportedly the patient blood sugar was appropriate, and with unresponsive status, CPR commenced. After approximately 5 minutes, 3 doses of epinephrine, the patient had return of spontaneous circulation, though no interactivity. Patient did not require additional CPR in route. Past Medical History:  Diagnosis Date   Arthritis    Ascending aortic aneurysm ([place])    PER CT CHEST 03-08-18 IN [facility] , 4.0CM   Chronic kidney disease    Claudication ([place])    Coronary artery disease 1997   CFX PCI, '97. Mod residual RI and RCA dis.   Diabetes mellitus (HCC)    IDDM   Dyslipidemia    Dyspnea    GERD (gastroesophageal reflux disease)    Hard of hearing    History of bronchitis    History of chemotherapy    History of kidney stones 1958   HTN (hypertension)    Lacunar stroke ([place])    Metastatic lung cancer (metastasis from lung to other site), unspecified laterality ([place])    DIAGNOSED 02-2018, MANAGED BY DR Lawton   NSTEMI (non-ST elevated myocardial infarction) ([place])    PAD (peripheral artery disease) ([place])    Pneumonia    Tobacco abuse    Urinary incontinence    Wears glasses     Patient Active Problem List   Diagnosis Date Noted   Cardiac arrest ([place]) 09/27/2018   Acute on chronic combined systolic and diastolic congestive heart failure ([place]) 07/08/2018   Acute respiratory failure with hypoxia ([place]) 07/08/2018    Atrial fibrillation with RVR ([place]) 07/08/2018   Pleural effusion, right    Protein calorie malnutrition ([place]) 04/18/2018   Encounter for antineoplastic chemotherapy 03/20/2018   Encounter for antineoplastic immunotherapy 03/20/2018   Goals of care, counseling/discussion 03/20/2018   Small cell lung cancer, right ([place]) 03/18/2018   Lung mass    NSTEMI (non-ST elevated myocardial infarction) ([place]) 03/08/2018   Kidney stone 03/08/2018   Mesenteric mass 03/08/2018   Renal insufficiency    Lacunar stroke ([place]) 01/27/2017   Left leg weakness 01/27/2017   Tobacco abuse 01/27/2017   Atherosclerosis of native arteries of extremities with intermittent claudication, right leg ([place]) 05/13/2016   PAD (peripheral artery disease) ([place]) 02/19/2016   CAD S/P percutaneous coronary angioplasty 09/20/2013   Essential hypertension 09/20/2013   Hyperlipidemia 09/20/2013   Insulin dependent diabetes mellitus ([place]) 09/20/2013   Claudication ([place]) 09/20/2013    Past Surgical History:  Procedure Laterality Date   APPENDECTOMY     "busted on me"   COLONOSCOPY     CORONARY ANGIOPLASTY  1997   CFX   CYSTOSCOPY WITH RETROGRADE PYELOGRAM, URETEROSCOPY AND STENT PLACEMENT Right 04/13/2018   Procedure: CYSTOSCOPY WITH RETROGRADE PYELOGRAM, URETEROSCOPY AND STENT PLACEMENT;  Surgeon: Alexis Frock, MD;  Location: [facility];  Service: Urology;  Laterality: Right;  1 HR   ENDARTERECTOMY FEMORAL Right 05/13/2016   iliofemoral endarterectomy with bovine pericardial patch angioplasty   ENDARTERECTOMY  FEMORAL Right 05/13/2016   Procedure: RIGHT ENDARTERECTOMY FEMORAL;  Surgeon: Serafina Mitchell, MD;  Location: [facility];  Service: Vascular;  Laterality: Right;   HOLMIUM LASER APPLICATION Right 2/97/9892   Procedure: HOLMIUM LASER APPLICATION;  Surgeon: Alexis Frock, MD;  Location: [facility];  Service: Urology;  Laterality: Right;   PATCH ANGIOPLASTY Right 05/13/2016   Procedure: [place];  Surgeon: Serafina Mitchell, MD;  Location: [facility];  Service: Vascular;  Laterality: Right;   PERIPHERAL VASCULAR CATHETERIZATION N/A 02/20/2016   Procedure: Lower Extremity Angiography;  Surgeon: Lorretta Harp, MD;  Location: [facility];  Service: Cardiovascular;  Laterality: N/A;        Home Medications    Prior to Admission medications   Medication Sig Start Date End Date Taking? Authorizing Provider  amLODipine (NORVASC) 5 MG tablet Take 1 tablet (5 mg total) by mouth daily. 03/30/18 07/08/18  Erlene Quan, PA-C  aspirin EC 81 MG EC tablet Take 1 tablet (81 mg total) by mouth daily. 03/17/18   Elgergawy, Silver Huguenin, MD  atorvastatin (LIPITOR) 20 MG tablet TAKE 1 TABLET (20 MG TOTAL) BY MOUTH DAILY. 09/02/18   Lorretta Harp, MD  clopidogrel (PLAVIX) 75 MG tablet Take 1 tablet (75 mg total) by mouth daily. 03/17/18   Elgergawy, Silver Huguenin, MD  docusate sodium (COLACE) 100 MG capsule Take 100 mg by mouth 2 (two) times daily.    [provider]  doxazosin (CARDURA) 1 MG tablet Take 3 mg by mouth at bedtime. Pt takes 3 at bedtime     [provider]  furosemide (LASIX) 20 MG tablet Take 1 tablet (20 mg total) by mouth daily. 07/12/18   Shelly Coss, MD  glucose blood (ONETOUCH VERIO) test strip CHECK BLOOD SUGARS 3 DAILY:DX 250.40 One touch verio 05/04/18   [provider]  insulin lispro (HUMALOG) 100 UNIT/ML injection Inject 0.35 mLs (35 Units total) into the skin 2 (two) times daily. Patient taking differently: Inject 25-40 Units into the skin 2 (two) times daily at 10 AM and 5 PM.  03/16/18   Elgergawy, Silver Huguenin, MD  isosorbide mononitrate (IMDUR) 30 MG 24 hr tablet TAKE 1 TABLET BY MOUTH EVERY DAY 09/20/18   Lorretta Harp, MD  meloxicam (MOBIC) 7.5 MG tablet Take 7.5 mg by mouth daily.    [provider]  metoprolol succinate (TOPROL-XL) 50 MG 24 hr tablet TAKE 1 TABLET (50 MG TOTAL) BY MOUTH DAILY.  TAKE WITH OR IMMEDIATELY FOLLOWING A MEAL. 10/04/18   Lorretta Harp, MD  metoprolol tartrate (LOPRESSOR) 50 MG tablet Take 1 tablet (50 mg total) by mouth 2 (two) times daily. 06/27/18   Lorretta Harp, MD  Misc. Devices MISC 1 wheelchair for mobility. 04/27/18   [provider]  nitroGLYCERIN (NITROSTAT) 0.4 MG SL tablet Place 1 tablet (0.4 mg total) under the tongue every 5 (five) minutes x 3 doses as needed for chest pain. Patient not taking: Reported on 08/23/2018 03/30/18   Erlene Quan, PA-C  Nutritional Supplements (FEEDING SUPPLEMENT, GLUCERNA 1.2 CAL,) LIQD Take as directed by physician 05/17/18   [provider]  pantoprazole (PROTONIX) 40 MG tablet Take 1 tablet (40 mg total) by mouth daily. 03/16/18   Elgergawy, Silver Huguenin, MD  polyethylene glycol (MIRALAX / GLYCOLAX) packet Take 17 g by mouth 2 (two) times daily.    [provider]  prochlorperazine (COMPAZINE) 10 MG tablet Take 1 tablet (10 mg total)  by mouth every 6 (six) hours as needed for nausea or vomiting. 03/28/18   Curt Bears, MD  senna-docusate (SENOKOT-S) 8.6-50 MG tablet Take 1 tablet by mouth 2 (two) times daily. While taking strong pain meds to prevent constipation 04/13/18   Alexis Frock, MD  Sennosides (EX-LAX PO) Take 2 tablets by mouth daily as needed (constipation).    [provider]    Family History Family History  Problem Relation Age of Onset   Diabetes Sister    Arthritis Mother    Heart disease Father     Social History Social History   Tobacco Use   Smoking status: Former Smoker    Years: 65.00    Last attempt to quit: 04/08/2018    Years since quitting: 0.5   Smokeless tobacco: Never Used  Substance Use Topics   Alcohol use: No   Drug use: No     Allergies   Codeine   Review of Systems Review of Systems  Unable to perform ROS: Acuity of condition     Physical Exam Updated Vital Signs BP (!) 84/51    Pulse (!) 107    Temp (!) 96 F  (35.6 C) (Temporal)    Resp 15    Ht 6' (1.829 m)    Wt 77.1 kg    SpO2 99%    BMI 23.06 kg/m   Physical Exam Vitals signs and nursing note reviewed.  Constitutional:      General: He is in acute distress.     Appearance: He is ill-appearing.     Comments: Ill-appearing elderly male unresponsive with occasional eye opening  HENT:     Head: Normocephalic and atraumatic.  Eyes:     Conjunctiva/sclera: Conjunctivae normal.  Cardiovascular:     Rate and Rhythm: Regular rhythm. Tachycardia present.  Pulmonary:     Breath sounds: Decreased air movement present.     Comments: Audible breath sounds with assisted ventilation bilaterally. Abdominal:     General: There is no distension.  Skin:    General: Skin is warm and dry.  Neurological:     Comments: Unresponsive, though with occasional eye opening, spontaneously, without appropriate following of commands or tracking.  Pupils are 2 mm, bilateral  Psychiatric:        Cognition and Memory: Cognition is impaired.      ED Treatments / Results  Labs (all labs ordered are listed, but only abnormal results are displayed) Labs Reviewed  CBC WITH DIFFERENTIAL/PLATELET - Abnormal; Notable for the following components:      Result Value   WBC 10.6 (*)    RBC 3.60 (*)    Hemoglobin 10.8 (*)    HCT 36.6 (*)    MCV 101.7 (*)    MCHC 29.5 (*)    Platelets 125 (*)    Abs Immature Granulocytes 0.32 (*)    All other components within normal limits  PROTIME-INR - Abnormal; Notable for the following components:   Prothrombin Time 18.0 (*)    INR 1.5 (*)    All other components within normal limits  APTT - Abnormal; Notable for the following components:   aPTT 53 (*)    All other components within normal limits  COMPREHENSIVE METABOLIC PANEL - Abnormal; Notable for the following components:   CO2 12 (*)    Glucose, Bld 281 (*)    Creatinine, Ser 1.74 (*)    Total Protein 6.3 (*)    Albumin 3.2 (*)    AST 64 (*)  GFR calc non Af  Amer 36 (*)    GFR calc Af Amer 42 (*)    Anion gap 23 (*)    All other components within normal limits  LIPID PANEL - Abnormal; Notable for the following components:   HDL 31 (*)    All other components within normal limits  CBG MONITORING, ED - Abnormal; Notable for the following components:   Glucose-Capillary 222 (*)    All other components within normal limits  CULTURE, BLOOD (ROUTINE X 2)  CULTURE, BLOOD (ROUTINE X 2)  URINE CULTURE  CULTURE, RESPIRATORY  TROPONIN I  MAGNESIUM  LACTIC ACID, PLASMA  LACTIC ACID, PLASMA  PROCALCITONIN  STREP PNEUMONIAE URINARY ANTIGEN  LEGIONELLA PNEUMOPHILA SEROGP 1 UR AG  BLOOD GAS, ARTERIAL    EKG EKG Interpretation  Date/Time:  Tuesday October 18 2018 11:40:33 EDT Ventricular Rate:  114 PR Interval:    QRS Duration: 186 QT Interval:  383 QTC Calculation: 530 R Axis:   84 Text Interpretation:  Sinus tachycardia Left bundle branch block Baseline wander in lead(s) V2 Abnormal ekg Confirmed by Carmin Muskrat [phone]) on 10/06/2018 11:51:09 AM   Radiology Ct Head Wo Contrast  Result Date: 09/23/2018 CLINICAL DATA:  Altered level of consciousness. EXAM: CT HEAD WITHOUT CONTRAST TECHNIQUE: Contiguous axial images were obtained from the base of the skull through the vertex without intravenous contrast. COMPARISON:  02/10/2017 MRI FINDINGS: Brain: There is concern for several slightly hyperdense lesions within both cerebral hemispheres. Surrounding edema noted in the left frontal and posterior right parietal lobes. Findings are concerning for metastases. Old lacunar infarct in the right periventricular white matter. There is atrophy and chronic small vessel disease changes. Vascular: No hyperdense vessel or unexpected calcification. Skull: No acute calvarial abnormality. Sinuses/Orbits: Visualized paranasal sinuses and mastoids clear. Orbital soft tissues unremarkable. Other: None IMPRESSION: Concern for several scattered hyperdense lesions within  the cerebral hemispheres bilaterally concerning for metastases. Some of these areas demonstrate surrounding vasogenic edema. These could be further evaluated with MRI. Electronically Signed   By: Rolm Baptise M.D.   On: 10/06/2018 13:32   Dg Chest Port 1 View  Result Date: 10/17/2018 CLINICAL DATA:  Recent cardiac arrest EXAM: PORTABLE CHEST 1 VIEW COMPARISON:  09/29/2018 FINDINGS: Cardiac shadow is enlarged. Endotracheal tube is noted 2 cm above the carina. Nasogastric catheter is coiled within the proximal esophagus. Aortic calcifications are noted. Small right-sided pleural effusion is seen stable from prior CT examination. The lungs are well aerated without focal confluent infiltrate. Mild central vascular congestion is noted. Old rib fractures are noted on the left as well as a prior right scapular fracture stable from the previous exam. IMPRESSION: Endotracheal tube in satisfactory position. Gastric catheter is looped upon itself within the proximal esophagus. Mild vascular congestion and right-sided pleural effusion Electronically Signed   By: Inez Catalina M.D.   On: 09/28/2018 12:46    Procedures Procedures (including critical care time)  Medications Ordered in ED Medications  0.9 %  sodium chloride infusion (10 mL/hr Intravenous New Bag/Given 09/20/2018 1238)  0.9 %  sodium chloride infusion (has no administration in time range)  0.9 %  sodium chloride infusion (has no administration in time range)  norepinephrine (LEVOPHED) 4mg  in 280mL premix infusion (has no administration in time range)  sodium chloride 0.9 % bolus 1,000 mL (0 mLs Intravenous Stopped 10/11/2018 1236)     Initial Impression / Assessment and Plan / ED Course  I have reviewed the triage vital signs and the  nursing notes.  Pertinent labs & imaging results that were available during my care of the patient were reviewed by me and considered in my medical decision making (see chart for details).    After arrival the patient  was transferred to our monitoring equipment, had continuous cardiac monitoring, and endotracheal tube was checked for positioning, then he was switched to our mechanical ventilation with the assistance of a respiratory therapist. Soon after, the patient's blood pressure decreased, requiring initiation of an epinephrine drip. Point-of-care x-ray with right-sided pleural effusion, and on chart review, this seems to have been present before.  After this initial evaluation, resuscitation I discussed his case with his daughter, who was unaware of the patient's progression of disease, also has no additional knowledge of the patient goals of care. Chart review notable for conversation with his physician 2 weeks ago after CT demonstrated increased metastatic burden of his small cell lung cancer.  At that point goals of care were discussed, the patient was noted to be a full code.  Update: Remaining labs notable for acidosis on arterial blood gas, and a head CT with findings concerning for metastatic progression. Patient remains hypotensive, with increasing amounts of epinephrine. Critical care is seen and evaluated the patient, he is preparing to go to the ICU.    Elderly male with metastatic small cell lung cancer presents in extremis, after unwitnessed cardiac arrest. Here patient had hypotension requiring initiation of vasopressors.  Findings concerning for worsening disease burden, including metastases to his brain. Patient required admission to the ICU for further monitoring, management.  Final Clinical Impressions(s) / ED Diagnoses  Cardiac arrest  CRITICAL CARE Performed by: Carmin Muskrat Total critical care time: 40 minutes Critical care time was exclusive of separately billable procedures and treating other patients. Critical care was necessary to treat or prevent imminent or life-threatening deterioration. Critical care was time spent personally by me on the following activities:  development of treatment plan with patient and/or surrogate as well as nursing, discussions with consultants, evaluation of patient's response to treatment, examination of patient, obtaining history from patient or surrogate, ordering and performing treatments and interventions, ordering and review of laboratory studies, ordering and review of radiographic studies, pulse oximetry and re-evaluation of patient's condition.    Carmin Muskrat, MD 10/08/2018 [phone]

## 2018-10-18 NOTE — H&P (Addendum)
NAME:  Arthur [name], MRN:  [id number], DOB:  16-Nov-1940, LOS: 0 ADMISSION DATE:  09/20/2018, CONSULTATION DATE: 3/31 REFERRING MD: Vanita Panda CHIEF COMPLAINT: Cardiac arrest  Brief History   78 year old male patient with history of metastatic small cell lung cancer admitted 3/31 status post PEA arrest time to return of spontaneous circulation estimated at 10 minutes, downtime prior to EMS arrival unknown.  Unresponsive exhibiting myoclonus on presentation.  History of present illness   This is a 78 year old male patient with history of widely metastatic lung cancer, he is status post 6 cycles of systemic chemotherapy with carboplatin, etoposide and Tecentriq.  He is now is post 4 cycles of Tecentriq most recent imaging showed stable disease with notes from cancer center indicating the plan was to continue current maintenance regimen with plan for referral to radiation oncology.  He had apparently been in usual health up until day of admission. From medical records apparently he contacted [facility] with concern about blood glucose reportedly.  Upon their arrival they found him unresponsive in a chair.  He was was pulseless and in asystole on presentation.  Downtime was unclear.  CPR was initiated, he received 3 doses of epinephrine during CPR he was intubated, reported time to return of spontaneous circulation estimated at 10 minutes.  Initial glucose on EMS arrival was 226. On arrival to the emergency room he was unresponsive, hypotensive requiring titration of epinephrine infusion.  He was exhibiting occasional episodes of myoclonus but was otherwise unresponsive.  Pulmonary asked to admit  Past Medical History  Widely metastatic small cell lung cancer with initial mass right upper lobe with extension to right hilum supraclavicular lymphadenopathy and retroperitoneal as well as adrenal gland and right subscapular metastasis. CKD Diabetes type 2 GERD Prior stroke Pulmonary artery disease   Significant Hospital Events   3/31: Admitted status post PEA arrest.  Placed on epinephrine infusion for shock  Consults:    Procedures:  Endotracheal tube 3/31>>>  Significant Diagnostic Tests:  CT brain 3/31>>>Concern for several scattered hyperdense lesions within the cerebral hemispheres bilaterally concerning for metastases. Some of these areas demonstrate surrounding vasogenic edema.  Micro Data:  Blood culture 3/31>>> Urine culture 3/31>>> Sputum culture 3/31>>>   Antimicrobials:    Interim history/subjective:  Unresponsive.  Currently on epinephrine infusion  Objective   Blood pressure (Abnormal) 84/51, pulse (Abnormal) 107, temperature (Abnormal) 96 F (35.6 C), temperature source Temporal, resp. rate 15, height 6' (1.829 m), weight 77.1 kg, SpO2 99 %.    Vent Mode: PRVC FiO2 (%):  [100 %] 100 % Set Rate:  [14 bmp] 14 bmp Vt Set:  [600 mL] 600 mL PEEP:  [5 cmH20] 5 cmH20 Plateau Pressure:  [20 cmH20] 20 cmH20  No intake or output data in the 24 hours ending 09/28/2018 1325 Filed Weights   10/07/2018 1141  Weight: 77.1 kg    Examination: General: Chronically ill-appearing 78 year old male currently unresponsive with GCS of 3 HENT: Orally intubated no JVD Lungs: Clear diminished bases Cardiovascular: Regular rate and rhythm Abdomen: Soft nontender Extremities: Cool, pulses palpable Neuro: Unresponsive.  Having episodes of myoclonus  GU: Due to void  Resolved Hospital Problem list     Assessment & Plan:  PEA cardiac arrest -etiology unclear: ? Seizure ? Primary cardiac event  Plan Admit to intensive care Cycle cardiac enzymes Echocardiogram  Circulatory shock -Presumed cardiogenic in nature status post cardiac arrest but etiology remains unclear.   Plan Admit to intensive care Transition epinephrine to norepinephrine per peripheral  protocol Holding antihypertensives Check cortisol Panculture Check influenza and respiratory viral panel  Acute  hypoxic respiratory failure status post cardiac arrest Has history of small cell lung cancer and chronic right pleural effusion Portable chest x-ray personally reviewed: This shows continued right effusion Plan Full ventilatory support VAP bundle Sputum culture PAD protocol RASS goal 0  Acute metabolic encephalopathy, exhibiting evidence of anoxic brain injury.  Cerebral edema ? Anoxic related or associated w/ metastasis  Plan Decadron IV Neuro consult  EEG PRN Versed for possible seizure Supportive care Not a candidate for hypothermia given prior malignancy and extended downtime  Anion gap metabolic acidosis almost certainly from lactic acidosis from prolonged CPR Plan Repeat chemistry   CKD with acute on chronic renal failure Plan Renal dose medications Keep euvolemic Strict intake output A.m. chemistry  Mild leukocytosis Plan Panculture  Anemia, macrocytic Plan Continue B vitamin supplementation Trend CBC  Transfuse for hemoglobin less than 7  History of diabetes with hyperglycemia Plan Sliding scale insulin   Best pracPEA cardiac arrest withtice:  Diet: NPO Pain/Anxiety/Delirium protocol (if indicated): PAD VAP protocol (if indicated): 3/31 DVT prophylaxis: SCD GI prophylaxis: PPI Glucose control: ssi Mobility: BR Code Status: dnr Family Communication: updated daughter [place] [phone]. Made clear there is high concern for devastating neurological injury. DNR but cont current rx for now. Pt has indicated he would not want life support in past.  Disposition:  Admit to ICU. S/p cardiac arrest. If CT finding are indeed representing metastasis w/ associated seizure. Which would be a good explanation as what precipitated the cardiac arrest. Will admit to ICU. Not a candidate for hypothermia d/t metastatic disease. Will cont current vasoactive gtts but no escalation.  Do NOT think this was COVID-19 as likely contributing factor was seizure/vasogenic  edema  Labs   CBC: Recent Labs  Lab 09/23/2018 1143  WBC 10.6*  NEUTROABS 6.0  HGB 10.8*  HCT 36.6*  MCV 101.7*  PLT 125*    Basic Metabolic Panel: Recent Labs  Lab 10/06/2018 1143  NA 138  K 4.3  CL 103  CO2 12*  GLUCOSE 281*  BUN 20  CREATININE 1.74*  CALCIUM 9.0  MG 1.8   GFR: Estimated Creatinine Clearance: 37.5 mL/min (A) (by C-G formula based on SCr of 1.74 mg/dL (H)). Recent Labs  Lab 10/05/2018 1143  WBC 10.6*    Liver Function Tests: Recent Labs  Lab 09/18/2018 1143  AST 64*  ALT 32  ALKPHOS 81  BILITOT 0.8  PROT 6.3*  ALBUMIN 3.2*   No results for input(s): LIPASE, AMYLASE in the last 168 hours. No results for input(s): AMMONIA in the last 168 hours.  ABG No results found for: PHART, PCO2ART, PO2ART, HCO3, TCO2, ACIDBASEDEF, O2SAT   Coagulation Profile: Recent Labs  Lab 09/22/2018 1143  INR 1.5*    Cardiac Enzymes: Recent Labs  Lab 09/24/2018 1143  TROPONINI <0.03    HbA1C: Hgb A1c MFr Bld  Date/Time Value Ref Range Status  03/09/2018 01:26 PM 7.1 (H) 4.8 - 5.6 % Final    Comment:    (NOTE) Pre diabetes:          5.7%-6.4% Diabetes:              >6.4% Glycemic control for   <7.0% adults with diabetes   03/08/2018 04:42 PM 7.2 (H) 4.8 - 5.6 % Final    Comment:    (NOTE) Pre diabetes:          5.7%-6.4% Diabetes:              >  6.4% Glycemic control for   <7.0% adults with diabetes     CBG: Recent Labs  Lab 09/19/2018 1143  GLUCAP 222*    Review of Systems:   Not able   Past Medical History  He,  has a past medical history of Arthritis, Ascending aortic aneurysm ([place]), Chronic kidney disease, Claudication ([place]), Coronary artery disease (1997), Diabetes mellitus ([place]), Dyslipidemia, Dyspnea, GERD (gastroesophageal reflux disease), Hard of hearing, History of bronchitis, History of chemotherapy, History of kidney stones (1958), HTN (hypertension), Lacunar stroke ([place]), Metastatic lung cancer (metastasis from lung to other  site), unspecified laterality [facility]), NSTEMI (non-ST elevated myocardial infarction) ([place]), PAD (peripheral artery disease) ([place]), Pneumonia, Tobacco abuse, Urinary incontinence, and Wears glasses.   Surgical History    Past Surgical History:  Procedure Laterality Date  . APPENDECTOMY     "busted on me"  . COLONOSCOPY    . CORONARY ANGIOPLASTY  1997   CFX  . CYSTOSCOPY WITH RETROGRADE PYELOGRAM, URETEROSCOPY AND STENT PLACEMENT Right 04/13/2018   Procedure: CYSTOSCOPY WITH RETROGRADE PYELOGRAM, URETEROSCOPY AND STENT PLACEMENT;  Surgeon: Alexis Frock, MD;  Location: [facility];  Service: Urology;  Laterality: Right;  1 HR  . ENDARTERECTOMY FEMORAL Right 05/13/2016   iliofemoral endarterectomy with bovine pericardial patch angioplasty  . ENDARTERECTOMY FEMORAL Right 05/13/2016   Procedure: RIGHT ENDARTERECTOMY FEMORAL;  Surgeon: Serafina Mitchell, MD;  Location: [place];  Service: Vascular;  Laterality: Right;  . HOLMIUM LASER APPLICATION Right 2/69/4854   Procedure: HOLMIUM LASER APPLICATION;  Surgeon: Alexis Frock, MD;  Location: [facility];  Service: Urology;  Laterality: Right;  . PATCH ANGIOPLASTY Right 05/13/2016   Procedure: [place];  Surgeon: Serafina Mitchell, MD;  Location: [place];  Service: Vascular;  Laterality: Right;  . PERIPHERAL VASCULAR CATHETERIZATION N/A 02/20/2016   Procedure: Lower Extremity Angiography;  Surgeon: Lorretta Harp, MD;  Location: [place] [facility];  Service: Cardiovascular;  Laterality: N/A;     Social History   reports that he quit smoking about 6 months ago. He quit after 65.00 years of use. He has never used smokeless tobacco. He reports that he does not drink alcohol or use drugs.   Family History   His family history includes Arthritis in his mother; Diabetes in his sister; Heart disease in his father.   Allergies Allergies  Allergen Reactions  . Codeine Swelling     Home Medications  Prior to Admission  medications   Medication Sig Start Date End Date Taking? Authorizing Provider  amLODipine (NORVASC) 5 MG tablet Take 1 tablet (5 mg total) by mouth daily. 03/30/18 07/08/18  Erlene Quan, PA-C  aspirin EC 81 MG EC tablet Take 1 tablet (81 mg total) by mouth daily. 03/17/18   Elgergawy, Silver Huguenin, MD  atorvastatin (LIPITOR) 20 MG tablet TAKE 1 TABLET (20 MG TOTAL) BY MOUTH DAILY. 09/02/18   Lorretta Harp, MD  clopidogrel (PLAVIX) 75 MG tablet Take 1 tablet (75 mg total) by mouth daily. 03/17/18   Elgergawy, Silver Huguenin, MD  docusate sodium (COLACE) 100 MG capsule Take 100 mg by mouth 2 (two) times daily.    [provider]  doxazosin (CARDURA) 1 MG tablet Take 3 mg by mouth at bedtime. Pt takes 3 at bedtime     [provider]  furosemide (LASIX) 20 MG tablet Take 1 tablet (20 mg total) by mouth daily. 07/12/18   Shelly Coss, MD  glucose blood (ONETOUCH VERIO) test strip CHECK BLOOD SUGARS 3  DAILY:DX 250.40 One touch verio 05/04/18   [provider]  insulin lispro (HUMALOG) 100 UNIT/ML injection Inject 0.35 mLs (35 Units total) into the skin 2 (two) times daily. Patient taking differently: Inject 25-40 Units into the skin 2 (two) times daily at 10 AM and 5 PM.  03/16/18   Elgergawy, Silver Huguenin, MD  isosorbide mononitrate (IMDUR) 30 MG 24 hr tablet TAKE 1 TABLET BY MOUTH EVERY DAY 09/20/18   Lorretta Harp, MD  meloxicam (MOBIC) 7.5 MG tablet Take 7.5 mg by mouth daily.    [provider]  metoprolol succinate (TOPROL-XL) 50 MG 24 hr tablet TAKE 1 TABLET (50 MG TOTAL) BY MOUTH DAILY. TAKE WITH OR IMMEDIATELY FOLLOWING A MEAL. 10/04/18   Lorretta Harp, MD  metoprolol tartrate (LOPRESSOR) 50 MG tablet Take 1 tablet (50 mg total) by mouth 2 (two) times daily. 06/27/18   Lorretta Harp, MD  Misc. Devices MISC 1 wheelchair for mobility. 04/27/18   [provider]  nitroGLYCERIN (NITROSTAT) 0.4 MG SL tablet Place 1 tablet (0.4 mg total) under the tongue  every 5 (five) minutes x 3 doses as needed for chest pain. Patient not taking: Reported on 08/23/2018 03/30/18   Erlene Quan, PA-C  Nutritional Supplements (FEEDING SUPPLEMENT, GLUCERNA 1.2 CAL,) LIQD Take as directed by physician 05/17/18   [provider]  pantoprazole (PROTONIX) 40 MG tablet Take 1 tablet (40 mg total) by mouth daily. 03/16/18   Elgergawy, Silver Huguenin, MD  polyethylene glycol (MIRALAX / GLYCOLAX) packet Take 17 g by mouth 2 (two) times daily.    [provider]  prochlorperazine (COMPAZINE) 10 MG tablet Take 1 tablet (10 mg total) by mouth every 6 (six) hours as needed for nausea or vomiting. 03/28/18   Curt Bears, MD  senna-docusate (SENOKOT-S) 8.6-50 MG tablet Take 1 tablet by mouth 2 (two) times daily. While taking strong pain meds to prevent constipation 04/13/18   Alexis Frock, MD  Sennosides (EX-LAX PO) Take 2 tablets by mouth daily as needed (constipation).    [provider]     Critical care time: 34 min    Erick Colace ACNP-BC [place] Pager # [phone] OR # [phone] if no answer  Attending Note:  78 year old male with PMH of [place] stage IV who presents with intracranial edema on CT that I reviewed myself.  On exam, myoclonus noted with diminished PS diffusely.  I reviewed CXR myself, ETT is in a good position.  Discussed with PCCM-NP.  Spoke with patient's wife.  DNR status clarified.  Will admit to [facility].  No need for hypothermia given concern for intracranial abnormalities and myoclonus.  Patient is low risk for covid and ID agrees, will d/c isolation.  EEG and neuro consult.  Will likely transition to comfort.  Sedation as ordered to control myoclonus.  PCCM will admit.  The patient is critically ill with multiple organ systems failure and requires high complexity decision making for assessment and support, frequent evaluation and titration of therapies, application of advanced monitoring technologies and  extensive interpretation of multiple databases.   Critical Care Time devoted to patient care services described in this note is  45  Minutes. This time reflects time of care of this signee Dr Jennet Maduro. This critical care time does not reflect procedure time, or teaching time or supervisory time of PA/NP/Med student/Med Resident etc but could involve care discussion time.  Rush Farmer, M.D. [facility] Pulmonary/Critical Care Medicine. Pager: [phone].  After hours pager: [phone].

## 2018-10-18 NOTE — Consult Note (Addendum)
Neurology Consultation  Reason for Consult: Seizures versus myoclonus status post cardiac arrest Referring Physician: [name], NP, Dr. Alma Downs  CC: Status post cardiac arrest - abnormal movements  History is obtained from: Chart review HPI: Arthur [name] is a 78 y.o. male was a past medical history of metastatic small cell lung cancer with large right upper lobe lung mass with extension into the right hilum, external and supraclavicular lymphadenopathy and metastases to the left adrenal and right scapular bone diagnosed in 2019, diabetes, CKD, coronary artery disease, AAA, hyperlipidemia, tobacco abuse, brought in by EMS when they were called by the patient for concerns of not feeling well and possibly having low glucose.  Upon arrival, EMS found the patient unresponsive in a chair and pulseless and asystole.  Unclear downtime.  CPR initiated received 3 doses of epinephrine during CPR, was intubated. Unclear downtime prior to EMS arrival.  Reported 10-minute between EMS arrival and return of spontaneous circulation. Initial glucose on EMS arrival was 226. On arrival in the emergency room he was unresponsive and hypotensive requiring epinephrine drip. He was also exhibiting frequent jerking movements of his whole body without any gaze deviation, for which neurological consultation was obtained  ROS: Unable to obtain due to altered mental status.   Past Medical History:  Diagnosis Date  . Arthritis   . Ascending aortic aneurysm ([facility])    PER CT CHEST 03-08-18 IN EP[facility] , 4.0CM  . Chronic kidney disease   . Claudication ([place])   . Coronary artery disease 1997   CFX PCI, '97. Mod residual RI and RCA dis.  . Diabetes mellitus ([place])    IDDM  . Dyslipidemia   . Dyspnea   . GERD (gastroesophageal reflux disease)   . Hard of hearing   . History of bronchitis   . History of chemotherapy   . History of kidney stones 1958  . HTN (hypertension)   . Lacunar stroke ([place])   . Metastatic lung  cancer (metastasis from lung to other site), unspecified laterality ([place])    DIAGNOSED 02-2018, MANAGED BY DR [place]  . NSTEMI (non-ST elevated myocardial infarction) ([place])   . PAD (peripheral artery disease) ([place])   . Pneumonia   . Tobacco abuse   . Urinary incontinence   . Wears glasses    Family History  Problem Relation Age of Onset  . Diabetes Sister   . Arthritis Mother   . Heart disease Father     Social History:   reports that he quit smoking about 6 months ago. He quit after 65.00 years of use. He has never used smokeless tobacco. He reports that he does not drink alcohol or use drugs.  Medications  Current Facility-Administered Medications:  .  0.9 %  sodium chloride infusion, , Intravenous, Continuous, Babcock, Peter E, NP .  0.9 %  sodium chloride infusion, 250 mL, Intravenous, Continuous, Salvadore Dom E, NP, Last Rate: 10 mL/hr at 10/09/2018 1400, 250 mL at 10/07/2018 1400 .  dexamethasone (DECADRON) injection 4 mg, 4 mg, Intravenous, Q6H, Salvadore Dom E, NP, 4 mg at 09/28/2018 1416 .  insulin aspart (novoLOG) injection 0-20 Units, 0-20 Units, Subcutaneous, Q4H, Babcock, Peter E, NP .  levETIRAcetam (KEPPRA) IVPB 1500 mg/ 100 mL premix, 1,500 mg, Intravenous, Once, Salvadore Dom E, NP .  levETIRAcetam (KEPPRA) IVPB 500 mg/100 mL premix, 500 mg, Intravenous, Q12H, Salvadore Dom E, NP .  norepinephrine (LEVOPHED) 4mg  in 245mL premix infusion, 2-10 mcg/min, Intravenous, Titrated, Erick Colace, NP,  Last Rate: 22.5 mL/hr at 10/12/2018 1415, 6 mcg/min at 10/09/2018 1415 .  pantoprazole (PROTONIX) injection 40 mg, 40 mg, Intravenous, Q24H, Kary Kos, Peter E, NP .  propofol (DIPRIVAN) 1000 MG/100ML infusion, 0-50 mcg/kg/min, Intravenous, Continuous, Erick Colace, NP  Facility-Administered Medications Ordered in Other Encounters:  .  sodium chloride flush (NS) 0.9 % injection 10 mL, 10 mL, Intracatheter, PRN, Curt Bears, MD  Exam: Current vital  signs: BP (!) 78/53   Pulse (!) 106   Temp (!) 96 F (35.6 C) (Temporal)   Resp 18   Ht 6' (1.829 m)   Wt 77.1 kg   SpO2 100%   BMI 23.06 kg/m  Vital signs in last 24 hours: Temp:  [96 F (35.6 C)] 96 F (35.6 C) (03/31 1141) Pulse Rate:  [93-114] 106 (03/31 1400) Resp:  [14-27] 18 (03/31 1400) BP: (68-113)/(45-66) 78/53 (03/31 1400) SpO2:  [99 %-100 %] 100 % (03/31 1400) FiO2 (%):  [100 %] 100 % (03/31 1129) Weight:  [77.1 kg] 77.1 kg (03/31 1141) General: Intubated, was given paralytics for sedation. HEENT: Cephalic atraumatic Lungs: Clear to auscultation Cardiovascular: Respiratory regular rhythm Abdomen: Nondistended nontender Extremities: Cool with changes of chronic stasis Neurological exam Sedated intubated No spontaneous limb movement except for vigorous myoclonic activity that lasts anywhere from a couple seconds to 10 to 15 seconds and then resolves.  No gaze deviation during this time.  No real change in vitals at the time. Nonverbal and does not follow commands. Pupils are 2 mm minimally reactive Absent corneals bilaterally. Breathing with the ventilator No grimace or withdrawal to noxious stimulation.   Labs leukocytosis with a white count of 10.6, anemia with hemoglobin of 10.9, sodium 139, potassium 3.2, glucose 281, creatinine 1. 7 4 I have reviewed labs in epic and the results pertinent to this consultation are: CBC    Component Value Date/Time   WBC 10.6 (H) 10/12/2018 1143   RBC 3.60 (L) 09/28/2018 1143   HGB 10.9 (L) 10/17/2018 1339   HGB 10.9 (L) 10/04/2018 0854   HCT 32.0 (L) 10/09/2018 1339   PLT 125 (L) 09/24/2018 1143   PLT 136 (L) 10/04/2018 0854   MCV 101.7 (H) 10/07/2018 1143   MCH 30.0 09/22/2018 1143   MCHC 29.5 (L) 10/17/2018 1143   RDW 12.7 09/19/2018 1143   LYMPHSABS 3.8 10/17/2018 1143   MONOABS 0.4 10/01/2018 1143   EOSABS 0.1 10/15/2018 1143   BASOSABS 0.0 09/23/2018 1143    CMP     Component Value Date/Time   NA 139  10/05/2018 1339   NA 133 (L) 03/30/2018 0829   K 3.2 (L) 09/27/2018 1339   CL 103 10/05/2018 1143   CO2 12 (L) 09/30/2018 1143   GLUCOSE 281 (H) 10/11/2018 1143   BUN 20 09/19/2018 1143   BUN 31 (H) 03/30/2018 0829   CREATININE 1.74 (H) 10/16/2018 1143   CREATININE 1.22 10/04/2018 0854   CREATININE 1.46 (H) 02/27/2016 0820   CALCIUM 9.0 09/28/2018 1143   PROT 6.3 (L) 09/20/2018 1143   ALBUMIN 3.2 (L) 10/04/2018 1143   AST 64 (H) 10/16/2018 1143   AST 15 10/04/2018 0854   ALT 32 09/19/2018 1143   ALT 15 10/04/2018 0854   ALKPHOS 81 09/30/2018 1143   BILITOT 0.8 09/19/2018 1143   BILITOT 0.4 10/04/2018 0854   GFRNONAA 36 (L) 09/21/2018 1143   GFRNONAA 56 (L) 10/04/2018 0854   GFRAA 42 (L) 10/06/2018 1143   GFRAA >60 10/04/2018 0854   Imaging I  have reviewed the images obtained:  CT-scan of the brain concerning for metastasis.  Presentation of multiple hyperdense lesions in both cerebral hemispheres and cerebellum.  Assessment: 78 year old man with widely metastatic small cell lung cancer to the lymph nodes, left adrenals and right scapular bone, CKD, coronary disease, AAA, hyperlipidemia, tobacco abuse brought in by EMS for unresponsiveness. He called EMS complaining is not feeling well and feels his sugar is low.  Upon EMS arrival his sugars were in the 200s. He was pulseless and the rhythm was asystole. Unclear downtime. From EMS arrival to return of spontaneous circulation about 10 minutes with 3 rounds of epi. Exhibiting florid myoclonic movements.  CT head concerning for multiple metastases that were not present in 2019. Concern for these movements being seizure versus myoclonus. I am less concerned for seiuzre and more for myoclonus given cardiac arrest, asystole and pattern of quick jerky movements without much change in vitals and no gaze deviation - but CTH is concerning for mets, so can not r/o seizures, hence need EEG  Impression: Status post cardiac arrest-evaluate  for seizure versus myoclonus Evaluate for coma Evaluate for new metastases to the brain from the small cell lung cancer Evaluate for anoxic injury to the brain  Recommendations: Stat EEG We will decide continuing LTM EEG based on stat EEG results Propofol drip at 40 mcg/kg/min -can go up to 120 if pressures tolerate. Load with Keppra 1500 mg IV x1 and then continue 500 mg twice daily. If the myoclonic activity is not controlled by Keppra, the next up would be to load Depakote at 20 mg/kg and then start Depakote 500 mg twice daily. Other strategies for management of myoclonus would include trying standing doses of Klonopin via the OG or NG tube. Obtain an MRI of the brain when possible Maintain seizure precautions Neurology will continue to follow with you.  This exam was too soon after the intubation and with sedation and paralytics on board, it is not appropriate at this time to provide any sort of prognostication.  -- Amie Portland, MD [facility] Pager: [phone] If 7pm to 7am, please call on call as listed on AMION.  CRITICAL CARE ATTESTATION Performed by: Amie Portland, MD Total critical care time: 35 minutes Critical care time was exclusive of separately billable procedures and treating other patients and/or supervising APPs/Residents/Students Critical care was necessary to treat or prevent imminent or life-threatening deterioration due to status post cardiac arrest, myoclonus, concern for status epilepticus,, This patient is critically ill and at significant risk for neurological worsening and/or death and care requires constant monitoring. Critical care was time spent personally by me on the following activities: development of treatment plan with patient and/or surrogate as well as nursing, discussions with consultants, evaluation of patient's response to treatment, examination of patient, obtaining history from patient or surrogate, ordering and performing  treatments and interventions, ordering and review of laboratory studies, ordering and review of radiographic studies, pulse oximetry, re-evaluation of patient's condition, participation in multidisciplinary rounds and medical decision making of high complexity in the care of this patient.   This patient was evaluated with full COVID-19 precautions at the time. Subsequently, infectious diseases evaluated the patient and removed concern for COVID-19.

## 2018-10-18 NOTE — ED Notes (Signed)
MD aware of BP

## 2018-10-18 NOTE — Progress Notes (Signed)
STAT EEG completed; results pending. Dr Rory Percy notified.

## 2018-10-18 NOTE — Progress Notes (Signed)
Patient ID: Arthur [name], male   DOB: December 20, 1940, 78 y.o.   MRN: [id number]          [facility] Infectious Disease    Date of Admission:  09/27/2018            I have spoken with [name] NP and reviewed the patient's chart. There is a clear alternative diagnosis and COVID precautions are being discontinued.   [name], MD [facility] [place] [facility] [phone] pager   [phone] cell 09/29/2018, 2:24 PM

## 2018-10-18 NOTE — ED Notes (Signed)
ED TO INPATIENT HANDOFF REPORT  ED Nurse Name and Phone #: Abigail [place] [phone]  S Name/Age/Gender Arthur [name] 78 y.o. male Room/Bed: TRAAC/TRAAC  Code Status   Code Status: DNR  Home/SNF/Other Home {Patient oriented to: none verbal intubated Is this baseline? No   Triage Complete: Triage complete  Chief Complaint cpr  Triage Note Pt in from home via [facility], per report EMS was called for DM by pt, upon arrival pt was unresponsive with unknown downtime, pt initially in asystole with x 3 epis given pta and CPR being completed for 10 mins, pt has bil # 18 tgo ACs, ET tube 7.5 at teeth, CBG 226, pt rcvd 2 mg Versed pta d/t biting at ET tube, upon arrival pt opens eyes spontaneously does not follow commands, bil 14mm pupils upon arrival   Allergies Allergies  Allergen Reactions  . Codeine Swelling    Level of Care/Admitting Diagnosis ED Disposition    ED Disposition Condition [facility] Area: [place] [100100]  Level of Care: ICU [6]  Diagnosis: Cardiac arrest ([place]) [427.5.ICD-9-CM]  Admitting Physician: Rush Farmer [[id number]]  Attending Physician: Rush Farmer [phone]  Estimated length of stay: past midnight tomorrow  Certification:: I certify this patient will need inpatient services for at least 2 midnights  PT Class (Do Not Modify): Inpatient [101]  PT Acc Code (Do Not Modify): Private [1]       B Medical/Surgery History Past Medical History:  Diagnosis Date  . Arthritis   . Ascending aortic aneurysm (HCC)    PER CT CHEST 03-08-18 IN EPIC , 4.0CM  . Chronic kidney disease   . Claudication ([place])   . Coronary artery disease 1997   CFX PCI, '97. Mod residual RI and RCA dis.  . Diabetes mellitus ([place])    IDDM  . Dyslipidemia   . Dyspnea   . GERD (gastroesophageal reflux disease)   . Hard of hearing   . History of bronchitis   . History of chemotherapy   . History of kidney stones 1958  . HTN (hypertension)   . Lacunar  stroke ([place])   . Metastatic lung cancer (metastasis from lung to other site), unspecified laterality ([place])    DIAGNOSED 02-2018, MANAGED BY DR [place]  . NSTEMI (non-ST elevated myocardial infarction) ([place])   . PAD (peripheral artery disease) ([place])   . Pneumonia   . Tobacco abuse   . Urinary incontinence   . Wears glasses    Past Surgical History:  Procedure Laterality Date  . APPENDECTOMY     "busted on me"  . COLONOSCOPY    . CORONARY ANGIOPLASTY  1997   CFX  . CYSTOSCOPY WITH RETROGRADE PYELOGRAM, URETEROSCOPY AND STENT PLACEMENT Right 04/13/2018   Procedure: CYSTOSCOPY WITH RETROGRADE PYELOGRAM, URETEROSCOPY AND STENT PLACEMENT;  Surgeon: Alexis Frock, MD;  Location: [facility];  Service: Urology;  Laterality: Right;  1 HR  . ENDARTERECTOMY FEMORAL Right 05/13/2016   iliofemoral endarterectomy with bovine pericardial patch angioplasty  . ENDARTERECTOMY FEMORAL Right 05/13/2016   Procedure: RIGHT ENDARTERECTOMY FEMORAL;  Surgeon: Serafina Mitchell, MD;  Location: [facility];  Service: Vascular;  Laterality: Right;  . HOLMIUM LASER APPLICATION Right 09/02/863   Procedure: HOLMIUM LASER APPLICATION;  Surgeon: Alexis Frock, MD;  Location: [facility];  Service: Urology;  Laterality: Right;  . PATCH ANGIOPLASTY Right 05/13/2016   Procedure: PATCH ANGIOPLASTY USING Rueben Bash BIOLOGIC PATCH;  Surgeon: Serafina Mitchell, MD;  [facility]  OR;  Service: Vascular;  Laterality: Right;  . PERIPHERAL VASCULAR CATHETERIZATION N/A 02/20/2016   Procedure: Lower Extremity Angiography;  Surgeon: Lorretta Harp, MD;  Location: [facility];  Service: Cardiovascular;  Laterality: N/A;     A IV Location/Drains/Wounds Patient Lines/Drains/Airways Status   Active Line/Drains/Airways    Name:   Placement date:   Placement time:   Site:   Days:   Peripheral IV 10/08/2018 Left Antecubital   10/06/2018    1153    Antecubital   less than 1   Peripheral IV 10/09/2018 Right Antecubital   09/20/2018     1209    Antecubital   less than 1   Ureteral Drain/Stent Right ureter 5 Fr.   04/13/18    1420    Right ureter   188   External Urinary Catheter   09/27/2018    1249    -   less than 1   Airway 7.5 mm   10/16/2018    1100     less than 1          Intake/Output Last 24 hours No intake or output data in the 24 hours ending 10/01/2018 1403  Labs/Imaging Results for orders placed or performed during the hospital encounter of 09/21/2018 (from the past 48 hour(s))  CBC with Differential/Platelet     Status: Abnormal   Collection Time: 09/25/2018 11:43 AM  Result Value Ref Range   WBC 10.6 (H) 4.0 - 10.5 K/uL   RBC 3.60 (L) 4.22 - 5.81 MIL/uL   Hemoglobin 10.8 (L) 13.0 - 17.0 g/dL   HCT 36.6 (L) 39.0 - 52.0 %   MCV 101.7 (H) 80.0 - 100.0 fL   MCH 30.0 26.0 - 34.0 pg   MCHC 29.5 (L) 30.0 - 36.0 g/dL   RDW 12.7 11.5 - 15.5 %   Platelets 125 (L) 150 - 400 K/uL   nRBC 0.0 0.0 - 0.2 %   Neutrophils Relative % 56 %   Neutro Abs 6.0 1.7 - 7.7 K/uL   Lymphocytes Relative 36 %   Lymphs Abs 3.8 0.7 - 4.0 K/uL   Monocytes Relative 4 %   Monocytes Absolute 0.4 0.1 - 1.0 K/uL   Eosinophils Relative 1 %   Eosinophils Absolute 0.1 0.0 - 0.5 K/uL   Basophils Relative 0 %   Basophils Absolute 0.0 0.0 - 0.1 K/uL   Immature Granulocytes 3 %   Abs Immature Granulocytes 0.32 (H) 0.00 - 0.07 K/uL    Comment: Performed at [facility], [address], [place], [place] [address]  Protime-INR     Status: Abnormal   Collection Time: 10/03/2018 11:43 AM  Result Value Ref Range   Prothrombin Time 18.0 (H) 11.4 - 15.2 seconds   INR 1.5 (H) 0.8 - 1.2    Comment: (NOTE) INR goal varies based on device and disease states. Performed at [facility], [place] [address], [place], [place] [address]   APTT     Status: Abnormal   Collection Time: 10/09/2018 11:43 AM  Result Value Ref Range   aPTT 53 (H) 24 - 36 seconds    Comment:        IF BASELINE aPTT IS ELEVATED, SUGGEST PATIENT RISK ASSESSMENT BE USED TO  DETERMINE APPROPRIATE ANTICOAGULANT THERAPY. Performed at [facility], [place] [address], [place], [place] [address]   Comprehensive metabolic panel     Status: Abnormal   Collection Time: 10/14/2018 11:43 AM  Result Value  Ref Range   Sodium 138 135 - 145 mmol/L   Potassium 4.3 3.5 - 5.1 mmol/L   Chloride 103 98 - 111 mmol/L   CO2 12 (L) 22 - 32 mmol/L   Glucose, Bld 281 (H) 70 - 99 mg/dL   BUN 20 8 - 23 mg/dL   Creatinine, Ser 1.74 (H) 0.61 - 1.24 mg/dL   Calcium 9.0 8.9 - 10.3 mg/dL   Total Protein 6.3 (L) 6.5 - 8.1 g/dL   Albumin 3.2 (L) 3.5 - 5.0 g/dL   AST 64 (H) 15 - 41 U/L   ALT 32 0 - 44 U/L   Alkaline Phosphatase 81 38 - 126 U/L   Total Bilirubin 0.8 0.3 - 1.2 mg/dL   GFR calc non Af Amer 36 (L) >60 mL/min   GFR calc Af Amer 42 (L) >60 mL/min   Anion gap 23 (H) 5 - 15    Comment: Performed at [facility], [place] [address] [place], [place] [address]  Troponin I - ONCE - STAT     Status: None   Collection Time: 09/26/2018 11:43 AM  Result Value Ref Range   Troponin I <0.03 <0.03 ng/mL    Comment: Performed at [facility] [address], [place], [place] [address]  Lipid panel     Status: Abnormal   Collection Time: 10/06/2018 11:43 AM  Result Value Ref Range   Cholesterol 92 0 - 200 mg/dL   Triglycerides 132 <150 mg/dL   HDL 31 (L) >40 mg/dL   Total CHOL/HDL Ratio 3.0 RATIO   VLDL 26 0 - 40 mg/dL   LDL Cholesterol 35 0 - 99 mg/dL    Comment:        Total Cholesterol/HDL:CHD Risk Coronary Heart Disease Risk Table                     Men   Women  1/2 Average Risk   3.4   3.3  Average Risk       5.0   4.4  2 X Average Risk   9.6   7.1  3 X Average Risk  23.4   11.0        Use the calculated Patient Ratio above and the CHD Risk Table to determine the patient's CHD Risk.        ATP [name] CLASSIFICATION (LDL):  <100     mg/dL   Optimal  100-129  mg/dL   Near or Above                    Optimal  130-159  mg/dL   Borderline  160-189  mg/dL   High   >190     mg/dL   Very High Performed at [facility] [address], [place], [place] [address]   Magnesium     Status: None   Collection Time: 10/08/2018 11:43 AM  Result Value Ref Range   Magnesium 1.8 1.7 - 2.4 mg/dL    Comment: Performed at [facility] [address] [place],  [address]  CBG monitoring, ED     Status: Abnormal   Collection Time: 09/23/2018 11:43 AM  Result Value Ref Range   Glucose-Capillary 222 (H) 70 - 99 mg/dL   Comment 1 Notify RN    Comment 2 Document in Chart   I-STAT 7, (LYTES, BLD GAS, ICA, H+H)     Status: Abnormal   Collection Time: 09/27/2018  1:39  PM  Result Value Ref Range   pH, Arterial 7.149 (LL) 7.350 - 7.450   pCO2 arterial 48.5 (H) 32.0 - 48.0 mmHg   pO2, Arterial 277.0 (H) 83.0 - 108.0 mmHg   Bicarbonate 16.9 (L) 20.0 - 28.0 mmol/L   TCO2 18 (L) 22 - 32 mmol/L   O2 Saturation 100.0 %   Acid-base deficit 12.0 (H) 0.0 - 2.0 mmol/L   Sodium 139 135 - 145 mmol/L   Potassium 3.2 (L) 3.5 - 5.1 mmol/L   Calcium, Ion 1.18 1.15 - 1.40 mmol/L   HCT 32.0 (L) 39.0 - 52.0 %   Hemoglobin 10.9 (L) 13.0 - 17.0 g/dL   Patient temperature HIDE    Collection site RADIAL, ALLEN'S TEST ACCEPTABLE    Drawn by RT    Sample type ARTERIAL    Comment NOTIFIED PHYSICIAN    Ct Head Wo Contrast  Result Date: 09/30/2018 CLINICAL DATA:  Altered level of consciousness. EXAM: CT HEAD WITHOUT CONTRAST TECHNIQUE: Contiguous axial images were obtained from the base of the skull through the vertex without intravenous contrast. COMPARISON:  02/10/2017 MRI FINDINGS: Brain: There is concern for several slightly hyperdense lesions within both cerebral hemispheres. Surrounding edema noted in the left frontal and posterior right parietal lobes. Findings are concerning for metastases. Old lacunar infarct in the right periventricular white matter. There is atrophy and chronic small vessel disease changes. Vascular: No hyperdense vessel or unexpected calcification.  Skull: No acute calvarial abnormality. Sinuses/Orbits: Visualized paranasal sinuses and mastoids clear. Orbital soft tissues unremarkable. Other: None IMPRESSION: Concern for several scattered hyperdense lesions within the cerebral hemispheres bilaterally concerning for metastases. Some of these areas demonstrate surrounding vasogenic edema. These could be further evaluated with MRI. Electronically Signed   By: Rolm Baptise M.D.   On: 10/16/2018 13:32   Dg Chest Port 1 View  Result Date: 10/11/2018 CLINICAL DATA:  Recent cardiac arrest EXAM: PORTABLE CHEST 1 VIEW COMPARISON:  09/29/2018 FINDINGS: Cardiac shadow is enlarged. Endotracheal tube is noted 2 cm above the carina. Nasogastric catheter is coiled within the proximal esophagus. Aortic calcifications are noted. Small right-sided pleural effusion is seen stable from prior CT examination. The lungs are well aerated without focal confluent infiltrate. Mild central vascular congestion is noted. Old rib fractures are noted on the left as well as a prior right scapular fracture stable from the previous exam. IMPRESSION: Endotracheal tube in satisfactory position. Gastric catheter is looped upon itself within the proximal esophagus. Mild vascular congestion and right-sided pleural effusion Electronically Signed   By: Inez Catalina M.D.   On: 09/20/2018 12:46    Pending Labs Unresulted Labs (From admission, onward)    Start     Ordered   10/19/18 0500  CBC  Tomorrow morning,   R     09/26/2018 1323   10/19/18 6720  Basic metabolic panel  Tomorrow morning,   R     10/15/2018 1323   10/19/18 0500  Blood gas, arterial  Tomorrow morning,   R     10/17/2018 1323   10/19/18 0500  Magnesium  Tomorrow morning,   R     10/08/2018 1323   10/19/18 0500  Phosphorus  Tomorrow morning,   R     10/10/2018 1323   09/28/2018 1450  Blood gas, arterial  Once,   R     09/20/2018 1350   09/26/2018 1400  Respiratory Panel by PCR  (Pediatric Respiratory Virus Panel w droplet and contact  precautions)  Once,  R     09/30/2018 1400   09/22/2018 1335  Blood gas, arterial  Once,   R     10/04/2018 1334   10/05/2018 1323  Lactic acid, plasma  STAT Now then every 3 hours,   R     10/15/2018 1323   09/29/2018 1323  Procalcitonin  Once,   R     10/17/2018 1323   09/27/2018 1323  Culture, blood (routine x 2)  BLOOD CULTURE X 2,   R    Question:  Patient immune status  Answer:  Immunocompromised   10/03/2018 1323   10/02/2018 1323  Urine culture  Once,   R    Question:  Patient immune status  Answer:  Immunocompromised   09/27/2018 1323   09/25/2018 1323  Culture, respiratory (tracheal aspirate)  Once,   R    Question:  Patient immune status  Answer:  Immunocompromised   10/02/2018 1323   10/15/2018 1323  Strep pneumoniae urinary antigen  (not at [facility])  Once,   R     10/10/2018 1323   10/08/2018 1323  Legionella Pneumophila Serogp 1 Ur Ag  Once,   R     10/14/2018 1323          Vitals/Pain Today's Vitals   09/29/2018 1255 10/12/2018 1300 09/25/2018 1330 10/05/2018 1346  BP: (!) 84/51 (!) 84/54 113/66   Pulse: (!) 107 (!) 106 (!) 106   Resp:  (!) 22 14   Temp:      TempSrc:      SpO2: 99% 100% 100% 100%  Weight:      Height:        Isolation Precautions Droplet and Contact precautions  Medications Medications  0.9 %  sodium chloride infusion (0 mL/hr Intravenous Stopped 09/29/2018 1358)  0.9 %  sodium chloride infusion (has no administration in time range)  0.9 %  sodium chloride infusion (250 mLs Intravenous New Bag/Given 10/03/2018 1400)  norepinephrine (LEVOPHED) 4mg  in 229mL premix infusion (3 mcg/min Intravenous Rate/Dose Change 09/22/2018 1401)  dexamethasone (DECADRON) injection 4 mg (has no administration in time range)  sodium chloride 0.9 % bolus 1,000 mL (0 mLs Intravenous Stopped 09/23/2018 1236)    Mobility non-ambulatory     Focused Assessments Cardiac Assessment Handoff:  Cardiac Rhythm: Sinus tachycardia Lab Results  Component Value Date   TROPONINI <0.03 09/25/2018   No results  found for: DDIMER Does the Patient currently have chest pain? No  , Neuro Assessment Handoff:  Swallow screen pass? None  Cardiac Rhythm: Sinus tachycardia       Neuro Assessment: Exceptions to W[facility] Neuro Checks:      Last Documented NIHSS Modified Score:   Has TPA been given? No If patient is a Neuro Trauma and patient is going to OR before floor call report to [place] nurse: [phone] or [phone]  , Pulmonary Assessment Handoff:  Lung sounds: Bilateral Breath Sounds: Clear O2 Device: Ventilator        R Recommendations: See Admitting Provider Note  Report given to:   Additional Notes:  Per Kary Kos, NP pt DNR, spoke with daughter aware of plan

## 2018-10-18 NOTE — Procedures (Signed)
  [place] A. Merlene Laughter, MD     [email or web address]           HISTORY: The patient is 78 year old male with who was found unresponsive and subsequently underwent cardiopulmonary resuscitation.  He is noted to have myoclonic activities worrisome for seizures.  MEDICATIONS:  Current Facility-Administered Medications:  .  0.9 %  sodium chloride infusion, , Intravenous, Continuous, Erick Colace, NP, Last Rate: 50 mL/hr at 10/14/2018 1600 .  0.9 %  sodium chloride infusion, 250 mL, Intravenous, Continuous, Erick Colace, NP, Last Rate: 10 mL/hr at 10/08/2018 1400, 250 mL at 09/25/2018 1400 .  chlorhexidine gluconate (MEDLINE KIT) (PERIDEX) 0.12 % solution 15 mL, 15 mL, Mouth Rinse, BID, Yacoub, Wesam G, MD .  dexamethasone (DECADRON) injection 4 mg, 4 mg, Intravenous, Q6H, Salvadore Dom E, NP, 4 mg at 09/19/2018 1416 .  insulin aspart (novoLOG) injection 0-20 Units, 0-20 Units, Subcutaneous, Q4H, Erick Colace, NP, 7 Units at 09/22/2018 1533 .  levETIRAcetam (KEPPRA) IVPB 500 mg/100 mL premix, 500 mg, Intravenous, Q12H, Salvadore Dom E, NP .  MEDLINE mouth rinse, 15 mL, Mouth Rinse, 10 times per day, Rush Farmer, MD .  norepinephrine (LEVOPHED) 33m in 2564mpremix infusion, 2-10 mcg/min, Intravenous, Titrated, BaErick ColaceNP, Last Rate: 22.5 mL/hr at 10/17/2018 1600, 6 mcg/min at 09/22/2018 1600 .  pantoprazole (PROTONIX) injection 40 mg, 40 mg, Intravenous, Q24H, Babcock, Peter E, NP .  propofol (DIPRIVAN) 1000 MG/100ML infusion, 0-50 mcg/kg/min, Intravenous, Continuous, BaErick ColaceNP, Last Rate: 2.31 mL/hr at 10/16/2018 1600, 5 mcg/kg/min at 10/15/2018 1600  Facility-Administered Medications Ordered in Other Encounters:  .  sodium chloride flush (NS) 0.9 % injection 10 mL, 10 mL, Intracatheter, PRN, MoCurt BearsMD     ANALYSIS: A 16 channel recording using standard 10 20 measurements is conducted for 27 minutes.  Overall, the recording is replete with  low amplitude high-frequency beta activity approximately 20 Hz.  This is seen in the posterior and also frontal areas.  There is occasional frontocentral 3 to 4 Hz rhythmic delta activity.  Poor overall reactivity of the EEG is noted.  They are multiple episodes of high voltage mostly theta activity seen in a generalized fashion.  These episodes were associated with the clinical myoclonic jerks.  The significance is unclear and doubtful to be epileptic in nature.  No clear epileptiform activities are noted.  No focal slowing and no lateralized slowing is appreciated.   IMPRESSION: 1.  Multiple episodes of high voltage mostly theta activity occurring concurrently with clinical myogenic activity of unclear clinical significance but unlikely to be epileptic in nature. 2.  Occasional frontal central rhythmic delta slowing typically seen and toxic metabolic processes.      Canden Cieslinski A. DoMerlene LaughterM.D.  Diplomate, [employer] adviserf Psychiatry and Neurology ( Neurology).

## 2018-10-18 NOTE — ED Notes (Signed)
Per Vanita Panda, Md do not attempt to place OG at this time, Vanita Panda, MD removed C collar, Md spoke with Sharyn Lull the pts daughter on phone with update on pts status, will continue to monitor pt with goal MAP 65 prior to transport to CT

## 2018-10-18 NOTE — ED Notes (Signed)
Apolonio Schneiders pharmacist and Vanita Panda MD aware of continued titration of Epi, will continue to monitor and titrate based on order

## 2018-10-19 ENCOUNTER — Ambulatory Visit: Payer: Medicare Other

## 2018-10-19 ENCOUNTER — Inpatient Hospital Stay (HOSPITAL_COMMUNITY): Payer: Medicare Other

## 2018-10-19 ENCOUNTER — Ambulatory Visit: Admission: RE | Admit: 2018-10-19 | Payer: Medicare Other | Source: Ambulatory Visit | Admitting: Radiation Oncology

## 2018-10-19 DIAGNOSIS — G253 Myoclonus: Secondary | ICD-10-CM

## 2018-10-19 DIAGNOSIS — L8990 Pressure ulcer of unspecified site, unspecified stage: Secondary | ICD-10-CM

## 2018-10-19 DIAGNOSIS — J9600 Acute respiratory failure, unspecified whether with hypoxia or hypercapnia: Secondary | ICD-10-CM

## 2018-10-19 LAB — GLUCOSE, CAPILLARY
Glucose-Capillary: 135 mg/dL — ABNORMAL HIGH (ref 70–99)
Glucose-Capillary: 140 mg/dL — ABNORMAL HIGH (ref 70–99)
Glucose-Capillary: 153 mg/dL — ABNORMAL HIGH (ref 70–99)
Glucose-Capillary: 154 mg/dL — ABNORMAL HIGH (ref 70–99)
Glucose-Capillary: 156 mg/dL — ABNORMAL HIGH (ref 70–99)
Glucose-Capillary: 163 mg/dL — ABNORMAL HIGH (ref 70–99)

## 2018-10-19 LAB — CBC
HEMATOCRIT: 33.2 % — AB (ref 39.0–52.0)
Hemoglobin: 11.1 g/dL — ABNORMAL LOW (ref 13.0–17.0)
MCH: 30.1 pg (ref 26.0–34.0)
MCHC: 33.4 g/dL (ref 30.0–36.0)
MCV: 90 fL (ref 80.0–100.0)
Platelets: 91 10*3/uL — ABNORMAL LOW (ref 150–400)
RBC: 3.69 MIL/uL — ABNORMAL LOW (ref 4.22–5.81)
RDW: 13 % (ref 11.5–15.5)
WBC: 13 10*3/uL — ABNORMAL HIGH (ref 4.0–10.5)
nRBC: 0 % (ref 0.0–0.2)

## 2018-10-19 LAB — POCT I-STAT 7, (LYTES, BLD GAS, ICA,H+H)
Acid-base deficit: 6 mmol/L — ABNORMAL HIGH (ref 0.0–2.0)
Bicarbonate: 17 mmol/L — ABNORMAL LOW (ref 20.0–28.0)
Calcium, Ion: 1.68 mmol/L (ref 1.15–1.40)
HCT: 29 % — ABNORMAL LOW (ref 39.0–52.0)
Hemoglobin: 9.9 g/dL — ABNORMAL LOW (ref 13.0–17.0)
O2 Saturation: 99 %
Patient temperature: 97.5
Potassium: 3.8 mmol/L (ref 3.5–5.1)
Sodium: 137 mmol/L (ref 135–145)
TCO2: 18 mmol/L — ABNORMAL LOW (ref 22–32)
pCO2 arterial: 23.4 mmHg — ABNORMAL LOW (ref 32.0–48.0)
pH, Arterial: 7.468 — ABNORMAL HIGH (ref 7.350–7.450)
pO2, Arterial: 127 mmHg — ABNORMAL HIGH (ref 83.0–108.0)

## 2018-10-19 LAB — BLOOD GAS, ARTERIAL
Acid-base deficit: 6.2 mmol/L — ABNORMAL HIGH (ref 0.0–2.0)
Bicarbonate: 15.9 mmol/L — ABNORMAL LOW (ref 20.0–28.0)
Drawn by: 27407
FIO2: 50
MECHVT: 600 mL
O2 Saturation: 99 %
PEEP: 5 cmH2O
PO2 ART: 75 mmHg — AB (ref 83.0–108.0)
Patient temperature: 95.5
RATE: 24 resp/min
pH, Arterial: 7.578 — ABNORMAL HIGH (ref 7.350–7.450)

## 2018-10-19 LAB — BASIC METABOLIC PANEL
Anion gap: 14 (ref 5–15)
BUN: 35 mg/dL — ABNORMAL HIGH (ref 8–23)
CO2: 15 mmol/L — ABNORMAL LOW (ref 22–32)
CREATININE: 1.9 mg/dL — AB (ref 0.61–1.24)
Calcium: 8.8 mg/dL — ABNORMAL LOW (ref 8.9–10.3)
Chloride: 105 mmol/L (ref 98–111)
GFR calc Af Amer: 38 mL/min — ABNORMAL LOW (ref >60)
GFR calc non Af Amer: 33 mL/min — ABNORMAL LOW (ref >60)
Glucose, Bld: 169 mg/dL — ABNORMAL HIGH (ref 70–99)
Potassium: 3.7 mmol/L (ref 3.5–5.1)
Sodium: 134 mmol/L — ABNORMAL LOW (ref 135–145)

## 2018-10-19 LAB — LEGIONELLA PNEUMOPHILA SEROGP 1 UR AG: L. pneumophila Serogp 1 Ur Ag: NEGATIVE

## 2018-10-19 LAB — URINE CULTURE: Culture: NO GROWTH

## 2018-10-19 LAB — MAGNESIUM: Magnesium: 1.8 mg/dL (ref 1.7–2.4)

## 2018-10-19 LAB — PHOSPHORUS: Phosphorus: 3.1 mg/dL (ref 2.5–4.6)

## 2018-10-19 MED ORDER — SODIUM CHLORIDE 0.9 % IV BOLUS
500.0000 mL | Freq: Once | INTRAVENOUS | Status: AC
  Administered 2018-10-19: 500 mL via INTRAVENOUS

## 2018-10-19 MED ORDER — CLONAZEPAM 0.5 MG PO TBDP
0.5000 mg | ORAL_TABLET | Freq: Two times a day (BID) | ORAL | Status: DC
  Administered 2018-10-19 – 2018-10-20 (×4): 0.5 mg
  Filled 2018-10-19 (×2): qty 2
  Filled 2018-10-19 (×2): qty 1

## 2018-10-19 MED ORDER — GADOBUTROL 1 MMOL/ML IV SOLN
7.5000 mL | Freq: Once | INTRAVENOUS | Status: AC | PRN
  Administered 2018-10-19: 7.5 mL via INTRAVENOUS

## 2018-10-19 MED ORDER — CLONAZEPAM 0.1 MG/ML ORAL SUSPENSION: 0.5000 mg | Freq: Two times a day (BID) | ORAL | Status: DC

## 2018-10-19 NOTE — Progress Notes (Addendum)
NAME:  Terrian Sentell, MRN:  [id number], DOB:  04/11/1941, LOS: 1 ADMISSION DATE:  09/29/2018, CONSULTATION DATE: 3/31 REFERRING MD: Vanita Panda CHIEF COMPLAINT: Cardiac arrest  Brief History   78 year old male patient with history of metastatic small cell lung cancer admitted 3/31 status post PEA arrest time to return of spontaneous circulation estimated at 10 minutes, downtime prior to EMS arrival unknown.  Unresponsive exhibiting myoclonus on presentation.  History of present illness   This is a 78 year old male patient with history of widely metastatic lung cancer, he is status post 6 cycles of systemic chemotherapy with carboplatin, etoposide and Tecentriq.  He is now is post 4 cycles of Tecentriq most recent imaging showed stable disease with notes from cancer center indicating the plan was to continue current maintenance regimen with plan for referral to radiation oncology.  He had apparently been in usual health up until day of admission. From medical records apparently he contacted [facility] with concern about blood glucose reportedly.  Upon their arrival they found him unresponsive in a chair.  He was was pulseless and in asystole on presentation.  Downtime was unclear.  CPR was initiated, he received 3 doses of epinephrine during CPR he was intubated, reported time to return of spontaneous circulation estimated at 10 minutes.  Initial glucose on EMS arrival was 226. On arrival to the emergency room he was unresponsive, hypotensive requiring titration of epinephrine infusion.  He was exhibiting occasional episodes of myoclonus but was otherwise unresponsive.  Pulmonary asked to admit  Past Medical History  Widely metastatic small cell lung cancer with initial mass right upper lobe with extension to right hilum supraclavicular lymphadenopathy and retroperitoneal as well as adrenal gland and right subscapular metastasis. CKD Diabetes type 2 GERD Prior stroke Pulmonary artery disease   Significant Hospital Events   3/31: Admitted status post PEA arrest.  Placed on epinephrine infusion for shock  Consults:  Neuro  ID Procedures:  Endotracheal tube 3/31>>>  Significant Diagnostic Tests:  CT brain 3/31>>>Concern for several scattered hyperdense lesions within the cerebral hemispheres bilaterally concerning for metastases. Some of these areas demonstrate surrounding vasogenic edema. EEG 3/31> Per Dr. Merlene Laughter, " 1.  Multiple episodes of high voltage mostly theta activity occurring concurrently with clinical myogenic activity of unclear clinical significance but unlikely to be epileptic in nature. 2.  Occasional frontal central rhythmic delta slowing typically seen and toxic metabolic processes " MRI [name] 4/1> numerous infratentorial and suptratentorial metastasis, some are hemorrhagic. Edema and mass effect. No midline shift. Acute R cerebellar infarction, old basal ganglia infarct. Some ischemic small vessel changes, Personally reviewed    Micro Data:  Blood culture 3/31>>> Urine culture 3/31>>> Sputum culture 3/31>>>  Antimicrobials:    Interim history/subjective:  Persistent myoclonic jerk overnight S/p EEG and MRI brain Remains on pressors Objective   Blood pressure 92/62, pulse 75, temperature (!) 97.1 F (36.2 C), temperature source Oral, resp. rate 18, height 6' (1.829 m), weight 78.4 kg, SpO2 100 %.    Vent Mode: PRVC FiO2 (%):  [40 %-100 %] 40 % Set Rate:  [14 bmp-24 bmp] 18 bmp Vt Set:  [600 mL] 600 mL PEEP:  [5 cmH20] 5 cmH20 Plateau Pressure:  [20 cmH20-24 cmH20] 20 cmH20   Intake/Output Summary (Last 24 hours) at 10/19/2018 0834 Last data filed at 10/19/2018 0700 Gross per 24 hour  Intake 1962.76 ml  Output 900 ml  Net 1062.76 ml   Filed Weights   09/20/2018 1141 10/19/18 0500  Weight:  77.1 kg 78.4 kg    Examination:  General: Chronically ill appearing older adult male, intubated, unresponsive HENT: NCAT, ETT OGT secure, trachea midline,  pink mmm  Lungs: CTA bilaterally, symmetrical chest expansion  Cardiovascular: RRR s1s2 no r/g/m No JVD 2+ radial pulses  Abdomen: soft, round, non-distended. Normoactive bowel sounds x4  Extremities: Symmetrical bulk and tone, no obvious deformity, no peripheral edema  Neuro: Unresponsive. 51mm pupils bilaterally.  Skin: clean, dry, warm with bair hugger, no rashes   Resolved Hospital Problem list     Assessment & Plan:   PEA cardiac arrest -etiology unclear: ? Seizure ? Primary cardiac event   Plan No place for TTM given unknown down time, myoclonic jerk Continue supportive care  MAP goal > 65, titrate NE for goal   Shock  -presumed cardiogenic shock s/p PEA arrest Plan MAP goal >65, titrate NE for goal  Check cortisol RVP negative BCx pending Tracheal aspirate pending  Acute hypoxic respiratory failure status post cardiac arrest - history of small cell lung cancer and chronic right pleural effusion Portable chest x-ray personally reviewed: This shows continued right effusion -PCT 0.47 Plan Continue vent support VAP bundle Follow up  Tracheal aspirate  AM CXR  Titrate PEEP/FiO2 for SpO2 44-03%    Acute metabolic encephalopathy, exhibiting evidence of anoxic brain injury.  Cerebral edema ? Anoxic related or associated w/ metastasis -EEG 3/31 some evidence of myogenic activity, front central delta slowing seen with toxic metabolic processes   -MRI Brain 4/1 numerous brain mets, some hemorrhagic  Plan Decadron IV Neuro following, appreciate recs Keppra BID  Propofol for sedation PRN Versed for possible seizure Not a candidate for hypothermia given prior malignancy and extended downtime  Anion gap metabolic acidosis almost certainly from lactic acidosis from prolonged CPR -improving lactic acid Plan Follow BMPs, lactic acid    CKD, with acute on chronic kidney injury  -Cr up-trending  Plan Follow BMP Addresses electrolyte abnormalities PRN Avoid  nephrotoxic meds Renal dosing by pharmacy PRN Strict I/O Goal fluid balance net even   Mild leukocytosis -infective vs reactive vs steroid  Plan RVP negative BCx, tracheal aspirate, UCx pending  Anemia, macrocytic Plan Continue B vitamin supplementation Trend CBC  Transfuse for hemoglobin less than 7  History of diabetes with hyperglycemia Plan Sliding scale insulin   Best pracPEA cardiac arrest withtice:  Diet: NPO Pain/Anxiety/Delirium protocol (if indicated): PAD VAP protocol (if indicated): 3/31 DVT prophylaxis: SCD GI prophylaxis: PPI Glucose control: ssi Mobility: BR Code Status: DNR  Family Communication: updated daughter Wellington [place] [phone]. Discussed numerous brain mets, discussed neuro status. Daughter states they have never discussed life support before but she "[doesn't] think he is the kind of person who would want life support but we didn't ever talk about it exactly." She understands the severity of illness but is feeling overwhelmed by the news of brain mets and asked to "take a day," adding "I might need to take this day-by-day."  Disposition: Continue ICU level of care  Labs   CBC: Recent Labs  Lab 10/16/2018 1143 09/23/2018 1339 09/29/2018 1555 10/19/18 0143 10/19/18 0757  WBC 10.6*  --   --  13.0*  --   NEUTROABS 6.0  --   --   --   --   HGB 10.8* 10.9* 9.9* 11.1* 9.9*  HCT 36.6* 32.0* 29.0* 33.2* 29.0*  MCV 101.7*  --   --  90.0  --   PLT 125*  --   --  91*  --  Basic Metabolic Panel: Recent Labs  Lab 09/29/2018 1143 10/07/2018 1339 09/26/2018 1555 10/19/18 0143 10/19/18 0757  NA 138 139 137 134* 137  K 4.3 3.2* 3.7 3.7 3.8  CL 103  --   --  105  --   CO2 12*  --   --  15*  --   GLUCOSE 281*  --   --  169*  --   BUN 20  --   --  35*  --   CREATININE 1.74*  --   --  1.90*  --   CALCIUM 9.0  --   --  8.8*  --   MG 1.8  --   --  1.8  --   PHOS  --   --   --  3.1  --    GFR: Estimated Creatinine Clearance: 34.6 mL/min (A) (by C-G  formula based on SCr of 1.9 mg/dL (H)). Recent Labs  Lab 09/28/2018 1143 10/11/2018 1520 10/04/2018 2100 10/19/18 0143  PROCALCITON  --  0.47  --   --   WBC 10.6*  --   --  13.0*  LATICACIDVEN  --  9.3* 3.4*  --     Liver Function Tests: Recent Labs  Lab 09/28/2018 1143  AST 64*  ALT 32  ALKPHOS 81  BILITOT 0.8  PROT 6.3*  ALBUMIN 3.2*   No results for input(s): LIPASE, AMYLASE in the last 168 hours. No results for input(s): AMMONIA in the last 168 hours.  ABG    Component Value Date/Time   PHART 7.468 (H) 10/19/2018 0757   PCO2ART 23.4 (L) 10/19/2018 0757   PO2ART 127.0 (H) 10/19/2018 0757   HCO3 17.0 (L) 10/19/2018 0757   TCO2 18 (L) 10/19/2018 0757   ACIDBASEDEF 6.0 (H) 10/19/2018 0757   O2SAT 99.0 10/19/2018 0757     Coagulation Profile: Recent Labs  Lab 10/14/2018 1143  INR 1.5*    Cardiac Enzymes: Recent Labs  Lab 10/03/2018 1143  TROPONINI <0.03    HbA1C: Hgb A1c MFr Bld  Date/Time Value Ref Range Status  03/09/2018 01:26 PM 7.1 (H) 4.8 - 5.6 % Final    Comment:    (NOTE) Pre diabetes:          5.7%-6.4% Diabetes:              >6.4% Glycemic control for   <7.0% adults with diabetes   03/08/2018 04:42 PM 7.2 (H) 4.8 - 5.6 % Final    Comment:    (NOTE) Pre diabetes:          5.7%-6.4% Diabetes:              >6.4% Glycemic control for   <7.0% adults with diabetes     CBG: Recent Labs  Lab 09/18/2018 1531 09/18/2018 2045 10/19/18 0006 10/19/18 0438 10/19/18 0809  GLUCAP 220* 174* 156* 163* 140*    Critical care time: 50 minutes      Eliseo Gum MSN, AGACNP-BC [place] [phone] If no answer, [phone] 10/19/2018, 8:34 AM

## 2018-10-19 NOTE — Progress Notes (Signed)
[facility] Progress Note Patient Name: Arthur [name] DOB: 03-24-1941 MRN: [id number]   Date of Service  10/19/2018  HPI/Events of Note  Multiple issues: 1. ABG on 50%/PRVC 24/TV 600/P 5 = 7.578/critical/75.0/15b and 2. Oliguria - Bladder scan with 220 mL residual. LVEF = 35% to 40%.  eICU Interventions  Will order: 1. Decrease PRVC rate to 18. 2. ABG at 7:15 AM. 3. Bolus with 0.9 NaCl 500 mL IV over 1 hour now.      Intervention Category Major Interventions: Respiratory failure - evaluation and management;Acid-Base disturbance - evaluation and management Intermediate Interventions: Oliguria - evaluation and management  Sommer,Steven Eugene 10/19/2018, 5:35 AM

## 2018-10-19 NOTE — Progress Notes (Signed)
Called [facility] and Dr. Oletta Darter busy therefore spoke with Kathlee Nations, RN and asked her to relay the following to Dr. Oletta Darter:  Critical pCO2 on blood gas not calculative, multiple liquid stools overnight requiring flexi seal, no void overnight with recent bladder scan result 221 cc, MRI results.  Kathlee Nations, RN verbalized she would relay all mentioned above to MD.

## 2018-10-19 NOTE — Progress Notes (Signed)
Patient transported to MRI 

## 2018-10-19 NOTE — TOC Initial Note (Signed)
Transition of Care [facility]) - Initial/Assessment Note    Patient Details  Name: Arthur [name] MRN: [id number] Date of Birth: 1940-09-07  Transition of Care [facility]) CM/SW Contact:    Midge Minium RN, BSN, NCM-BC, ACM-RN [phone] Phone Number: 10/19/2018, 10:24 AM  Clinical Narrative:                 78 yo male presented s/p PEA arrest. Patient on full vent support; unable to complete the high-risk readmission screen. CM team will continue to follow for dispositional needs.    Expected Discharge Plan: [place] Barriers to Discharge: Continued Medical Work up    Expected Discharge Plan and Services Expected Discharge Plan: [place]   Discharge Planning Services: CM Consult   Admission diagnosis:  Cardiac arrest [facility]) [I46.9] Patient Active Problem List   Diagnosis Date Noted  . Pressure injury of skin 10/19/2018  . Cardiac arrest ([place]) 10/03/2018  . Acute on chronic combined systolic and diastolic congestive heart failure ([place]) 07/08/2018  . Acute respiratory failure with hypoxia ([place]) 07/08/2018  . Atrial fibrillation with RVR ([place]) 07/08/2018  . Pleural effusion, right   . Protein calorie malnutrition ([place]) 04/18/2018  . Encounter for antineoplastic chemotherapy 03/20/2018  . Encounter for antineoplastic immunotherapy 03/20/2018  . Goals of care, counseling/discussion 03/20/2018  . Small cell lung cancer, right ([place]) 03/18/2018  . Lung mass   . NSTEMI (non-ST elevated myocardial infarction) ([place]) 03/08/2018  . Kidney stone 03/08/2018  . Mesenteric mass 03/08/2018  . Renal insufficiency   . Lacunar stroke ([place]) 01/27/2017  . Left leg weakness 01/27/2017  . Tobacco abuse 01/27/2017  . Atherosclerosis of native arteries of extremities with intermittent claudication, right leg ([place]) 05/13/2016  . PAD (peripheral artery disease) ([place]) 02/19/2016  . CAD S/P percutaneous coronary angioplasty 09/20/2013  . Essential hypertension 09/20/2013   . Hyperlipidemia 09/20/2013  . Insulin dependent diabetes mellitus ([place]) 09/20/2013  . Claudication ([place]) 09/20/2013   PCP:  Dione Housekeeper, MD Pharmacy:   [id number] - [place], [address] [place] [address] Phone: [phone] Fax: [phone]     Social Determinants of Health (SDOH) Interventions    Readmission Risk Interventions Readmission Risk Prevention Plan 03/16/2018  Transportation Screening Complete  PCP or Specialist Appt within 5-7 Days Complete  Home Care Screening Complete  Medication Review (RN CM) Complete  Some recent data might be hidden

## 2018-10-19 NOTE — Progress Notes (Signed)
Dr. Oletta Darter called back and spoke with this RN.  MD gave order for flexi seal and bair hugger.  MD stated he would order NS bolus and for RRT to change vent rate to 18 per order.  MD acknowledged critical pCO2 and that patient had not yet voided along with bladder scan result of 221 cc and liquid stool requiring flexi seal.  No further orders given.

## 2018-10-19 NOTE — Progress Notes (Signed)
Subjective: The patient was seen in initial consultation yesterday for seizures versus myoclonus status post cardiac arrest. Multiple metastatic lesions were seen on CT and verified by MRI.   Objective: Current vital signs: BP 92/62   Pulse 84   Temp (!) 97.5 F (36.4 C) (Oral)   Resp 18   Ht 6' (1.829 m)   Wt 78.4 kg   SpO2 100%   BMI 23.44 kg/m  Vital signs in last 24 hours: Temp:  [96 F (35.6 C)-98 F (36.7 C)] 97.5 F (36.4 C) (04/01 0715) Pulse Rate:  [71-114] 84 (04/01 0338) Resp:  [14-28] 18 (04/01 0715) BP: (68-137)/(45-81) 92/62 (04/01 0715) SpO2:  [99 %-100 %] 100 % (04/01 0715) FiO2 (%):  [50 %-100 %] 50 % (04/01 0338) Weight:  [77.1 kg-78.4 kg] 78.4 kg (04/01 0500)  Intake/Output from previous day: 03/31 0701 - 04/01 0700 In: 1962.8 [I.V.:1263.3; IV Piggyback:699.5] Out: 900 [Urine:400; Emesis/NG output:300; Stool:200] Intake/Output this shift: No intake/output data recorded. Nutritional status:  Diet Order            Diet NPO time specified  Diet effective now             HEENT: [place]/AT Lungs: Intubated   Neurologic Exam: Sedated on 50 mcg/kg/hr of propofol Ment: No spontaneous eye opening or to stimuli. No extremity movement to any stimuli.  CN: Pupils sluggishly reactive. 3 mm to 2 mm. Weak doll's eye reflex. Eyes conjugate at midline with no nystagmus. Face flaccidly symmetric.  Motor/Sensory: Flaccid tone x 4. No asymmetry. No movement of extremities to any stimuli. Reflexes: Areflexic x 4   Lab Results: Results for orders placed or performed during the hospital encounter of 10/03/2018 (from the past 48 hour(s))  CBC with Differential/Platelet     Status: Abnormal   Collection Time: 10/01/2018 11:43 AM  Result Value Ref Range   WBC 10.6 (H) 4.0 - 10.5 K/uL   RBC 3.60 (L) 4.22 - 5.81 MIL/uL   Hemoglobin 10.8 (L) 13.0 - 17.0 g/dL   HCT 36.6 (L) 39.0 - 52.0 %   MCV 101.7 (H) 80.0 - 100.0 fL   MCH 30.0 26.0 - 34.0 pg   MCHC 29.5 (L) 30.0 - 36.0  g/dL   RDW 12.7 11.5 - 15.5 %   Platelets 125 (L) 150 - 400 K/uL   nRBC 0.0 0.0 - 0.2 %   Neutrophils Relative % 56 %   Neutro Abs 6.0 1.7 - 7.7 K/uL   Lymphocytes Relative 36 %   Lymphs Abs 3.8 0.7 - 4.0 K/uL   Monocytes Relative 4 %   Monocytes Absolute 0.4 0.1 - 1.0 K/uL   Eosinophils Relative 1 %   Eosinophils Absolute 0.1 0.0 - 0.5 K/uL   Basophils Relative 0 %   Basophils Absolute 0.0 0.0 - 0.1 K/uL   Immature Granulocytes 3 %   Abs Immature Granulocytes 0.32 (H) 0.00 - 0.07 K/uL    Comment: Performed at [facility], [address], [place], [place] [address]  Protime-INR     Status: Abnormal   Collection Time: 10/12/2018 11:43 AM  Result Value Ref Range   Prothrombin Time 18.0 (H) 11.4 - 15.2 seconds   INR 1.5 (H) 0.8 - 1.2    Comment: (NOTE) INR goal varies based on device and disease states. Performed at [facility], [place] [address] [place], [place] [address]   APTT     Status: Abnormal   Collection Time: 10/16/2018 11:43 AM  Result  Value Ref Range   aPTT 53 (H) 24 - 36 seconds    Comment:        IF BASELINE aPTT IS ELEVATED, SUGGEST PATIENT RISK ASSESSMENT BE USED TO DETERMINE APPROPRIATE ANTICOAGULANT THERAPY. Performed at [facility], [place] [address], [place], [place] [address]   Comprehensive metabolic panel     Status: Abnormal   Collection Time: 10/03/2018 11:43 AM  Result Value Ref Range   Sodium 138 135 - 145 mmol/L   Potassium 4.3 3.5 - 5.1 mmol/L   Chloride 103 98 - 111 mmol/L   CO2 12 (L) 22 - 32 mmol/L   Glucose, Bld 281 (H) 70 - 99 mg/dL   BUN 20 8 - 23 mg/dL   Creatinine, Ser 1.74 (H) 0.61 - 1.24 mg/dL   Calcium 9.0 8.9 - 10.3 mg/dL   Total Protein 6.3 (L) 6.5 - 8.1 g/dL   Albumin 3.2 (L) 3.5 - 5.0 g/dL   AST 64 (H) 15 - 41 U/L   ALT 32 0 - 44 U/L   Alkaline Phosphatase 81 38 - 126 U/L   Total Bilirubin 0.8 0.3 - 1.2 mg/dL   GFR calc non Af Amer 36 (L) >60 mL/min   GFR calc Af Amer 42 (L) >60 mL/min   Anion gap 23 (H) 5 -  15    Comment: Performed at [facility], [place] [address] [place], [place] [address]  Troponin I - ONCE - STAT     Status: None   Collection Time: 10/17/2018 11:43 AM  Result Value Ref Range   Troponin I <0.03 <0.03 ng/mL    Comment: Performed at [facility] [address] [place], [place] [address]  Lipid panel     Status: Abnormal   Collection Time: 10/04/2018 11:43 AM  Result Value Ref Range   Cholesterol 92 0 - 200 mg/dL   Triglycerides 132 <150 mg/dL   HDL 31 (L) >40 mg/dL   Total CHOL/HDL Ratio 3.0 RATIO   VLDL 26 0 - 40 mg/dL   LDL Cholesterol 35 0 - 99 mg/dL    Comment:        Total Cholesterol/HDL:CHD Risk Coronary Heart Disease Risk Table                     Men   Women  1/2 Average Risk   3.4   3.3  Average Risk       5.0   4.4  2 X Average Risk   9.6   7.1  3 X Average Risk  23.4   11.0        Use the calculated Patient Ratio above and the CHD Risk Table to determine the patient's CHD Risk.        ATP III CLASSIFICATION (LDL):  <100     mg/dL   Optimal  100-129  mg/dL   Near or Above                    Optimal  130-159  mg/dL   Borderline  160-189  mg/dL   High  >190     mg/dL   Very High Performed at [facility] [address], [place],  [address]   Magnesium     Status: None   Collection Time: 09/19/2018 11:43 AM  Result Value Ref Range   Magnesium 1.8 1.7 - 2.4 mg/dL    Comment: Performed at [facility], [address] [place], [place] [address]  CBG monitoring, ED     Status: Abnormal   Collection Time: 10/10/2018 11:43 AM  Result Value Ref Range   Glucose-Capillary 222 (H) 70 - 99 mg/dL   Comment 1 Notify RN    Comment 2 Document in Chart   I-STAT 7, (LYTES, BLD GAS, ICA, H+H)     Status: Abnormal   Collection Time: 09/21/2018  1:39 PM  Result Value Ref Range   pH, Arterial 7.149 (LL) 7.350 - 7.450   pCO2 arterial 48.5 (H) 32.0 - 48.0 mmHg   pO2, Arterial 277.0 (H) 83.0 - 108.0 mmHg   Bicarbonate 16.9 (L) 20.0 - 28.0  mmol/L   TCO2 18 (L) 22 - 32 mmol/L   O2 Saturation 100.0 %   Acid-base deficit 12.0 (H) 0.0 - 2.0 mmol/L   Sodium 139 135 - 145 mmol/L   Potassium 3.2 (L) 3.5 - 5.1 mmol/L   Calcium, Ion 1.18 1.15 - 1.40 mmol/L   HCT 32.0 (L) 39.0 - 52.0 %   Hemoglobin 10.9 (L) 13.0 - 17.0 g/dL   Patient temperature HIDE    Collection site RADIAL, ALLEN'S TEST ACCEPTABLE    Drawn by RT    Sample type ARTERIAL    Comment NOTIFIED PHYSICIAN   Lactic acid, plasma     Status: Abnormal   Collection Time: 09/21/2018  3:20 PM  Result Value Ref Range   Lactic Acid, Venous 9.3 (HH) 0.5 - 1.9 mmol/L    Comment: CRITICAL RESULT CALLED TO, READ BACK BY AND VERIFIED WITH: A PAYNE RN AT [phone] ON [id number] BY K FORSYTH Performed at [facility], [address] [place], [place] [address]   Procalcitonin     Status: None   Collection Time: 10/03/2018  3:20 PM  Result Value Ref Range   Procalcitonin 0.47 ng/mL    Comment:        Interpretation: PCT (Procalcitonin) <= 0.5 ng/mL: Systemic infection (sepsis) is not likely. Local bacterial infection is possible. (NOTE)       Sepsis PCT Algorithm           Lower Respiratory Tract                                      Infection PCT Algorithm    ----------------------------     ----------------------------         PCT < 0.25 ng/mL                PCT < 0.10 ng/mL         Strongly encourage             Strongly discourage   discontinuation of antibiotics    initiation of antibiotics    ----------------------------     -----------------------------       PCT 0.25 - 0.50 ng/mL            PCT 0.10 - 0.25 ng/mL               OR       >80% decrease in PCT            Discourage initiation of  antibiotics      Encourage discontinuation           of antibiotics    ----------------------------     -----------------------------         PCT >= 0.50 ng/mL              PCT 0.26 - 0.50 ng/mL               AND        <80% decrease in  PCT             Encourage initiation of                                             antibiotics       Encourage continuation           of antibiotics    ----------------------------     -----------------------------        PCT >= 0.50 ng/mL                  PCT > 0.50 ng/mL               AND         increase in PCT                  Strongly encourage                                      initiation of antibiotics    Strongly encourage escalation           of antibiotics                                     -----------------------------                                           PCT <= 0.25 ng/mL                                                 OR                                        > 80% decrease in PCT                                     Discontinue / Do not initiate                                             antibiotics Performed at [facility], [place] [address] [place], [place] [address]   Strep pneumoniae urinary antigen  (not at [facility])     Status:  None   Collection Time: 10/17/2018  3:31 PM  Result Value Ref Range   Strep Pneumo Urinary Antigen NEGATIVE NEGATIVE    Comment:        Infection due to S. pneumoniae cannot be absolutely ruled out since the antigen present may be below the detection limit of the test. Performed at [facility], [address] [place], [place] [address]   Glucose, capillary     Status: Abnormal   Collection Time: 10/09/2018  3:31 PM  Result Value Ref Range   Glucose-Capillary 220 (H) 70 - 99 mg/dL  I-STAT 7, (LYTES, BLD GAS, ICA, H+H)     Status: Abnormal   Collection Time: 10/12/2018  3:55 PM  Result Value Ref Range   pH, Arterial 7.335 (L) 7.350 - 7.450   pCO2 arterial 31.8 (L) 32.0 - 48.0 mmHg   pO2, Arterial 181.0 (H) 83.0 - 108.0 mmHg   Bicarbonate 16.9 (L) 20.0 - 28.0 mmol/L   TCO2 18 (L) 22 - 32 mmol/L   O2 Saturation 100.0 %   Acid-base deficit 8.0 (H) 0.0 - 2.0 mmol/L   Sodium 137 135 - 145 mmol/L   Potassium 3.7 3.5 - 5.1  mmol/L   Calcium, Ion 1.18 1.15 - 1.40 mmol/L   HCT 29.0 (L) 39.0 - 52.0 %   Hemoglobin 9.9 (L) 13.0 - 17.0 g/dL   Patient temperature 98.6 F    Collection site RADIAL, ALLEN'S TEST ACCEPTABLE    Drawn by RT    Sample type ARTERIAL   Respiratory Panel by PCR     Status: None   Collection Time: 09/23/2018  4:02 PM  Result Value Ref Range   Adenovirus NOT DETECTED NOT DETECTED   Coronavirus 229E NOT DETECTED NOT DETECTED    Comment: (NOTE) The Coronavirus on the Respiratory Panel, DOES NOT test for the novel  Coronavirus (2019 nCoV)    Coronavirus HKU1 NOT DETECTED NOT DETECTED   Coronavirus NL63 NOT DETECTED NOT DETECTED   Coronavirus OC43 NOT DETECTED NOT DETECTED   Metapneumovirus NOT DETECTED NOT DETECTED   Rhinovirus / Enterovirus NOT DETECTED NOT DETECTED   Influenza A NOT DETECTED NOT DETECTED   Influenza B NOT DETECTED NOT DETECTED   Parainfluenza Virus 1 NOT DETECTED NOT DETECTED   Parainfluenza Virus 2 NOT DETECTED NOT DETECTED   Parainfluenza Virus 3 NOT DETECTED NOT DETECTED   Parainfluenza Virus 4 NOT DETECTED NOT DETECTED   Respiratory Syncytial Virus NOT DETECTED NOT DETECTED   Bordetella pertussis NOT DETECTED NOT DETECTED   Chlamydophila pneumoniae NOT DETECTED NOT DETECTED   Mycoplasma pneumoniae NOT DETECTED NOT DETECTED    Comment: Performed at [facility], [address] [place], [place] [address]  MRSA PCR Screening     Status: None   Collection Time: 09/20/2018  4:02 PM  Result Value Ref Range   MRSA by PCR NEGATIVE NEGATIVE    Comment:        The GeneXpert MRSA Assay (FDA approved for NASAL specimens only), is one component of a comprehensive MRSA colonization surveillance program. It is not intended to diagnose MRSA infection nor to guide or monitor treatment for MRSA infections. Performed at [facility], [place] [address], [place],  [address]   Glucose, capillary     Status: Abnormal   Collection Time: 09/24/2018  8:45 PM  Result  Value Ref Range   Glucose-Capillary 174 (H) 70 - 99 mg/dL  Lactic acid, plasma     Status: Abnormal  Collection Time: 10/10/2018  9:00 PM  Result Value Ref Range   Lactic Acid, Venous 3.4 (HH) 0.5 - 1.9 mmol/L    Comment: CRITICAL RESULT CALLED TO, READ BACK BY AND VERIFIED WITH: B.RUDD,RN 2143 09/21/2018 M.CAMPBELL Performed at [facility], [place] [address], [place], [place] [address]   Glucose, capillary     Status: Abnormal   Collection Time: 10/19/18 12:06 AM  Result Value Ref Range   Glucose-Capillary 156 (H) 70 - 99 mg/dL  CBC     Status: Abnormal   Collection Time: 10/19/18  1:43 AM  Result Value Ref Range   WBC 13.0 (H) 4.0 - 10.5 K/uL   RBC 3.69 (L) 4.22 - 5.81 MIL/uL   Hemoglobin 11.1 (L) 13.0 - 17.0 g/dL   HCT 33.2 (L) 39.0 - 52.0 %   MCV 90.0 80.0 - 100.0 fL    Comment: REPEATED TO VERIFY DELTA CHECK NOTED    MCH 30.1 26.0 - 34.0 pg   MCHC 33.4 30.0 - 36.0 g/dL   RDW 13.0 11.5 - 15.5 %   Platelets 91 (L) 150 - 400 K/uL    Comment: REPEATED TO VERIFY PLATELET COUNT CONFIRMED BY SMEAR SPECIMEN CHECKED FOR CLOTS Immature Platelet Fraction may be clinically indicated, consider ordering this additional test [id number]    nRBC 0.0 0.0 - 0.2 %    Comment: Performed at [facility], [place] [address] [place], [place] [address]  Basic metabolic panel     Status: Abnormal   Collection Time: 10/19/18  1:43 AM  Result Value Ref Range   Sodium 134 (L) 135 - 145 mmol/L   Potassium 3.7 3.5 - 5.1 mmol/L   Chloride 105 98 - 111 mmol/L   CO2 15 (L) 22 - 32 mmol/L   Glucose, Bld 169 (H) 70 - 99 mg/dL   BUN 35 (H) 8 - 23 mg/dL   Creatinine, Ser 1.90 (H) 0.61 - 1.24 mg/dL   Calcium 8.8 (L) 8.9 - 10.3 mg/dL   GFR calc non Af Amer 33 (L) >60 mL/min   GFR calc Af Amer 38 (L) >60 mL/min   Anion gap 14 5 - 15    Comment: Performed at [facility] [address], [place], [place] [address]  Magnesium     Status: None   Collection Time: 10/19/18  1:43 AM  Result  Value Ref Range   Magnesium 1.8 1.7 - 2.4 mg/dL    Comment: Performed at [facility] [address] [place], [place] [address]  Phosphorus     Status: None   Collection Time: 10/19/18  1:43 AM  Result Value Ref Range   Phosphorus 3.1 2.5 - 4.6 mg/dL    Comment: Performed at [facility] [address], [place], [place] [address]  Blood gas, arterial     Status: Abnormal   Collection Time: 10/19/18  4:25 AM  Result Value Ref Range   FIO2 50.00    Delivery systems VENTILATOR    Mode PRESSURE REGULATED VOLUME CONTROL    VT 600 mL   LHR 24 resp/min   Peep/cpap 5.0 cm H20   pH, Arterial 7.578 (H) 7.350 - 7.450   pCO2 arterial  32.0 - 48.0 mmHg    CRITICAL RESULT CALLED TO, READ BACK BY AND VERIFIED WITH:    Comment:  VALUES BELOW REPORTABLE RANGE  JESSICA HALEY RT AT 0430 ON [id number] BY CARRIE MCCOLLUM    pO2, Arterial 75.0 (L) 83.0 -  108.0 mmHg   Bicarbonate 15.9 (L) 20.0 - 28.0 mmol/L   Acid-base deficit 6.2 (H) 0.0 - 2.0 mmol/L   O2 Saturation 99.0 %   Patient temperature 95.5    Collection site LEFT RADIAL    Drawn by [phone]    Sample type ARTERIAL    Allens test (pass/fail) PASS PASS  Glucose, capillary     Status: Abnormal   Collection Time: 10/19/18  4:38 AM  Result Value Ref Range   Glucose-Capillary 163 (H) 70 - 99 mg/dL    Recent Results (from the past 240 hour(s))  Respiratory Panel by PCR     Status: None   Collection Time: 09/18/2018  4:02 PM  Result Value Ref Range Status   Adenovirus NOT DETECTED NOT DETECTED Final   Coronavirus 229E NOT DETECTED NOT DETECTED Final    Comment: (NOTE) The Coronavirus on the Respiratory Panel, DOES NOT test for the novel  Coronavirus (2019 nCoV)    Coronavirus HKU1 NOT DETECTED NOT DETECTED Final   Coronavirus NL63 NOT DETECTED NOT DETECTED Final   Coronavirus OC43 NOT DETECTED NOT DETECTED Final   Metapneumovirus NOT DETECTED NOT DETECTED Final   Rhinovirus / Enterovirus NOT DETECTED NOT DETECTED Final    Influenza A NOT DETECTED NOT DETECTED Final   Influenza B NOT DETECTED NOT DETECTED Final   Parainfluenza Virus 1 NOT DETECTED NOT DETECTED Final   Parainfluenza Virus 2 NOT DETECTED NOT DETECTED Final   Parainfluenza Virus 3 NOT DETECTED NOT DETECTED Final   Parainfluenza Virus 4 NOT DETECTED NOT DETECTED Final   Respiratory Syncytial Virus NOT DETECTED NOT DETECTED Final   Bordetella pertussis NOT DETECTED NOT DETECTED Final   Chlamydophila pneumoniae NOT DETECTED NOT DETECTED Final   Mycoplasma pneumoniae NOT DETECTED NOT DETECTED Final    Comment: Performed at [facility], [place] [address] [place], [place] [address]  MRSA PCR Screening     Status: None   Collection Time: 09/28/2018  4:02 PM  Result Value Ref Range Status   MRSA by PCR NEGATIVE NEGATIVE Final    Comment:        The GeneXpert MRSA Assay (FDA approved for NASAL specimens only), is one component of a comprehensive MRSA colonization surveillance program. It is not intended to diagnose MRSA infection nor to guide or monitor treatment for MRSA infections. Performed at [facility], [place] [address] [place], [place] [address]     Lipid Panel Recent Labs    10/15/2018 1143  CHOL 92  TRIG 132  HDL 31*  CHOLHDL 3.0  VLDL 26  LDLCALC 35    Studies/Results: Ct Head Wo Contrast  Result Date: 10/17/2018 CLINICAL DATA:  Altered level of consciousness. EXAM: CT HEAD WITHOUT CONTRAST TECHNIQUE: Contiguous axial images were obtained from the base of the skull through the vertex without intravenous contrast. COMPARISON:  02/10/2017 MRI FINDINGS: Brain: There is concern for several slightly hyperdense lesions within both cerebral hemispheres. Surrounding edema noted in the left frontal and posterior right parietal lobes. Findings are concerning for metastases. Old lacunar infarct in the right periventricular white matter. There is atrophy and chronic small vessel disease changes. Vascular: No hyperdense vessel or  unexpected calcification. Skull: No acute calvarial abnormality. Sinuses/Orbits: Visualized paranasal sinuses and mastoids clear. Orbital soft tissues unremarkable. Other: None IMPRESSION: Concern for several scattered hyperdense lesions within the cerebral hemispheres bilaterally concerning for metastases. Some of these areas demonstrate surrounding vasogenic edema. These could be further evaluated with MRI. Electronically Signed  By: Rolm Baptise M.D.   On: 09/27/2018 13:32   Mr Jeri Cos XB Contrast  Result Date: 10/19/2018 CLINICAL DATA:  Found unresponsive, status post CPR. History of metastatic lung cancer, hypertension, diabetes and stroke. EXAM: MRI HEAD WITHOUT AND WITH CONTRAST TECHNIQUE: Multiplanar, multiecho pulse sequences of the brain and surrounding structures were obtained without and with intravenous contrast. CONTRAST:  7.5 cc Gadavist COMPARISON:  CT HEAD October 18, 2018 and MRI of the head January 28, 2018. FINDINGS: INTRACRANIAL CONTENTS: Subcentimeter RIGHT cerebellar reduced diffusion with low ADC values. At least 8 infratentorial and 20 supratentorial enhancing metastasis with associated reduced diffusion seen with hypercellular tumor. Multiple metastasis demonstrate susceptibility artifact consistent with blood products. Proportional vasogenic edema with regional mass effect. Dominant lesions include 18 mm LEFT basal ganglia and 16 mm LEFT cerebellum. No midline shift. Old bilateral basal ganglia lacunar infarcts. Patchy supratentorial white matter and pontine FLAIR T2 hyperintensities compatible with moderate chronic small vessel ischemic changes. No advanced parenchymal brain volume loss for age. No hydrocephalus. No abnormal extra-axial fluid collections. No abnormal extra-axial fluid collections. VASCULAR: Normal major intracranial vascular flow voids present at skull base. SKULL AND UPPER CERVICAL SPINE: No abnormal sellar expansion. Multiple cervical osseous metastasis. SINUSES/ORBITS:  The mastoid air-cells and included paranasal sinuses are well-aerated.The included ocular globes and orbital contents are non-suspicious. OTHER: None. IMPRESSION: 1. At least 8 infratentorial and 20 supratentorial new intraparenchymal metastasis, some of which are hemorrhagic. Regional vasogenic edema and mass effect without midline shift. 2. Acute subcentimeter RIGHT cerebellar infarct. 3. Old basal ganglia lacunar infarcts and moderate chronic small vessel ischemic changes. Electronically Signed   By: Elon Alas M.D.   On: 10/19/2018 03:30   Dg Chest Port 1 View  Result Date: 10/07/2018 CLINICAL DATA:  Recent cardiac arrest EXAM: PORTABLE CHEST 1 VIEW COMPARISON:  09/29/2018 FINDINGS: Cardiac shadow is enlarged. Endotracheal tube is noted 2 cm above the carina. Nasogastric catheter is coiled within the proximal esophagus. Aortic calcifications are noted. Small right-sided pleural effusion is seen stable from prior CT examination. The lungs are well aerated without focal confluent infiltrate. Mild central vascular congestion is noted. Old rib fractures are noted on the left as well as a prior right scapular fracture stable from the previous exam. IMPRESSION: Endotracheal tube in satisfactory position. Gastric catheter is looped upon itself within the proximal esophagus. Mild vascular congestion and right-sided pleural effusion Electronically Signed   By: Inez Catalina M.D.   On: 10/05/2018 12:46    Medications:  Scheduled: . chlorhexidine gluconate (MEDLINE KIT)  15 mL Mouth Rinse BID  . dexamethasone  4 mg Intravenous Q6H  . insulin aspart  0-20 Units Subcutaneous Q4H  . mouth rinse  15 mL Mouth Rinse 10 times per day  . pantoprazole (PROTONIX) IV  40 mg Intravenous Q24H   Continuous: . sodium chloride 50 mL/hr at 10/19/18 0700  . sodium chloride 10 mL/hr at 09/28/2018 2100  . levETIRAcetam Stopped (10/05/2018 2207)  . norepinephrine (LEVOPHED) Adult infusion Stopped (10/19/18 0622)  .  propofol (DIPRIVAN) infusion 50 mcg/kg/min (10/19/18 0700)    MRI brain: 1. At least 8 infratentorial and 20 supratentorial new intraparenchymal metastasis, some of which are hemorrhagic. Regional vasogenic edema and mass effect without midline shift. 2. Acute subcentimeter RIGHT cerebellar infarct. 3. Old basal ganglia lacunar infarcts and moderate chronic small vessel ischemic changes.  Assessment: 78 year old man with widely metastatic small cell lung cancer to the lymph nodes, left adrenals and right scapula, CKD, coronary disease,  AAA, hyperlipidemia, tobacco abuse brought in by EMS for unresponsiveness. Upon EMS arrival he was pulseless and the rhythm was asystole. Unclear downtime. Time from EMS arrival to ROSC was approximately 10 minutes. 1. Myoclonus now resolved on propofol sedation and scheduled Keppra 2. MRI brain reveals at least 8 infratentorial and 20 supratentorial new intraparenchymal metastasis, some of which are hemorrhagic. There is regional vasogenic edema and mass effect without midline shift. A possible acute subcentimeter RIGHT cerebellar infarct versus metastatic lesion is noted. There are old basal ganglia lacunar infarcts and moderate chronic small vessel ischemic changes. 3. Prognosis likely to be poor from a neurological standpoint. Will reassess in 48 hours.   Recommendations: 1. Continue propofol drip - can go up to 120 if blood pressures allow. 2. Continue Keppra 500 mg twice daily. 3. If the myoclonic activity recrudesces, the next up would be to load Depakote at 20 mg/kg and then start Depakote 500 mg twice daily. 4. Neurology will continue to follow with you.  35 minutes spent in the neurological evaluation and management of this critically ill patient.   LOS: 1 day   @Electronically  signed: Dr. Kerney Elbe 10/19/2018  7:29 AM

## 2018-10-19 NOTE — Progress Notes (Signed)
78 year old man with metastatic small cell lung cancer admitted with PEA arrest on 3/31, unclear downtime.  He had severe myoclonus requiring propofol coma. Head CT and MRI confirmed multiple brain metastases.  EEG showed theta activity during myoclonus but no obvious epileptiform activity  He has required low-dose Levophed for hypotension. Remains critically ill, orally intubated on propofol, unresponsive, RA SS -5, decreased breath sounds bilateral, S1-S2 regular, sinus on monitor, soft and nontender abdomen, 1+ edema  Chest x-ray from 3/31 was personally reviewed which shows NG tube coiled in looking back, ET tube in position and small right effusion. Labs show mild hyponatremia, slight increase creatinine to 1.9, mild anemia and leukocytosis. Impression/plan  Severe myoclonus concerning for anoxic encephalopathy-continue with propofol coma Continue Keppra May need Depakote, will start Klonopin via tube  PEA arrest -unclear cause whether this was primary cardiac or respiratory event related to seizure  Acute hypoxic respiratory failure-ventilator settings reviewed and adjusted  Metastatic small cell lung cancer to brain -Decadron IV.  Myoclonic seizures in the setting portends a very poor prognosis and this is been communicated to her daughter, waiting for her to come to terms with this, DNR issued  The patient is critically ill with multiple organ systems failure and requires high complexity decision making for assessment and support, frequent evaluation and titration of therapies, application of advanced monitoring technologies and extensive interpretation of multiple databases. Critical Care Time devoted to patient care services described in this note independent of APP/resident  time is 35 minutes.   Leanna Sato Elsworth Soho MD

## 2018-10-19 NOTE — Progress Notes (Signed)
Critical ABG results given to RN.  

## 2018-10-19 DEATH — deceased

## 2018-10-20 ENCOUNTER — Inpatient Hospital Stay (HOSPITAL_COMMUNITY): Payer: Medicare Other

## 2018-10-20 DIAGNOSIS — C7931 Secondary malignant neoplasm of brain: Secondary | ICD-10-CM

## 2018-10-20 LAB — BASIC METABOLIC PANEL
Anion gap: 11 (ref 5–15)
BUN: 50 mg/dL — ABNORMAL HIGH (ref 8–23)
CO2: 16 mmol/L — ABNORMAL LOW (ref 22–32)
Calcium: 8.4 mg/dL — ABNORMAL LOW (ref 8.9–10.3)
Chloride: 109 mmol/L (ref 98–111)
Creatinine, Ser: 2.44 mg/dL — ABNORMAL HIGH (ref 0.61–1.24)
GFR calc Af Amer: 28 mL/min — ABNORMAL LOW (ref >60)
GFR calc non Af Amer: 24 mL/min — ABNORMAL LOW (ref >60)
Glucose, Bld: 172 mg/dL — ABNORMAL HIGH (ref 70–99)
Potassium: 4.1 mmol/L (ref 3.5–5.1)
Sodium: 136 mmol/L (ref 135–145)

## 2018-10-20 LAB — GLUCOSE, CAPILLARY
Glucose-Capillary: 121 mg/dL — ABNORMAL HIGH (ref 70–99)
Glucose-Capillary: 133 mg/dL — ABNORMAL HIGH (ref 70–99)
Glucose-Capillary: 139 mg/dL — ABNORMAL HIGH (ref 70–99)
Glucose-Capillary: 155 mg/dL — ABNORMAL HIGH (ref 70–99)
Glucose-Capillary: 161 mg/dL — ABNORMAL HIGH (ref 70–99)
Glucose-Capillary: 178 mg/dL — ABNORMAL HIGH (ref 70–99)

## 2018-10-20 LAB — CBC
HCT: 30.8 % — ABNORMAL LOW (ref 39.0–52.0)
Hemoglobin: 10.4 g/dL — ABNORMAL LOW (ref 13.0–17.0)
MCH: 31 pg (ref 26.0–34.0)
MCHC: 33.8 g/dL (ref 30.0–36.0)
MCV: 91.9 fL (ref 80.0–100.0)
Platelets: 73 10*3/uL — ABNORMAL LOW (ref 150–400)
RBC: 3.35 MIL/uL — ABNORMAL LOW (ref 4.22–5.81)
RDW: 13.8 % (ref 11.5–15.5)
WBC: 18.7 10*3/uL — ABNORMAL HIGH (ref 4.0–10.5)
nRBC: 0 % (ref 0.0–0.2)

## 2018-10-20 LAB — LACTIC ACID, PLASMA: Lactic Acid, Venous: 1.8 mmol/L (ref 0.5–1.9)

## 2018-10-20 NOTE — Progress Notes (Signed)
NAME:  Arthur [name], MRN:  [id number], DOB:  09-18-1940, LOS: 2 ADMISSION DATE:  10/13/2018, CONSULTATION DATE: 3/31 REFERRING MD: Arthur [name] CHIEF COMPLAINT: Cardiac arrest  Brief History   78 year old male patient with history of metastatic small cell lung cancer admitted 3/31 status post PEA arrest time to return of spontaneous circulation estimated at 10 minutes, downtime prior to [name] arrival unknown.  Unresponsive exhibiting myoclonus on presentation.  History of present illness   This is a 78 year old male patient with history of widely metastatic lung cancer, he is status post 6 cycles of systemic chemotherapy with carboplatin, etoposide and Tecentriq.  He is now is post 4 cycles of Tecentriq most recent imaging showed stable disease with notes from cancer center indicating the plan was to continue current maintenance regimen with plan for referral to radiation oncology.  He had apparently been in usual health up until day of admission. From medical records apparently he contacted Arthur [name] with concern about blood glucose reportedly.  Upon their arrival they found him unresponsive in a chair.  He was was pulseless and in asystole on presentation.  Downtime was unclear.  CPR was initiated, he received 3 doses of epinephrine during CPR he was intubated, reported time to return of spontaneous circulation estimated at 10 minutes.  Initial glucose on [name] arrival was 226. On arrival to the emergency room he was unresponsive, hypotensive requiring titration of epinephrine infusion.  He was exhibiting occasional episodes of myoclonus but was otherwise unresponsive.  Pulmonary asked to admit  Past Medical History  Widely metastatic small cell lung cancer with initial mass right upper lobe with extension to right hilum supraclavicular lymphadenopathy and retroperitoneal as well as adrenal gland and right subscapular metastasis. CKD Diabetes type 2 GERD Prior stroke Pulmonary artery disease   Significant Hospital Events   3/31: Admitted status post PEA arrest.  Placed on epinephrine infusion for shock  Consults:  Neuro  ID Procedures:  Endotracheal tube 3/31>>>  Significant Diagnostic Tests:  CT brain 3/31>>>Concern for several scattered hyperdense lesions within the cerebral hemispheres bilaterally concerning for metastases. Some of these areas demonstrate surrounding vasogenic edema. EEG 3/31> Per Dr. Merlene [name], " 1.  Multiple episodes of high voltage mostly theta activity occurring concurrently with clinical myogenic activity of unclear clinical significance but unlikely to be epileptic in nature. 2.  Occasional frontal central rhythmic delta slowing typically seen and toxic metabolic processes " MRI Arthur [name] 4/1> numerous infratentorial and suptratentorial metastasis, some are hemorrhagic. Edema and mass effect. No midline shift. Acute R cerebellar infarction, old basal ganglia infarct. Some ischemic small vessel changes, Personally reviewed    Micro Data:  Blood culture 3/31>>> Urine culture 3/31>>> neg Sputum culture 3/31>>> RVP 3/31 >>> neg  Antimicrobials:    Interim history/subjective:  Remains on propofol due to myoclonus. Discussed with daughter this morning. She is waiting to discuss with one more family member before making decision regarding comfort measures.   Objective   Blood pressure 131/75, pulse 73, temperature 98.1 F (36.7 C), temperature source Oral, resp. rate 18, height 6' (1.829 m), weight 81.7 kg, SpO2 100 %.    Vent Mode: PRVC FiO2 (%):  [40 %] 40 % Set Rate:  [18 bmp] 18 bmp Vt Set:  [600 mL] 600 mL PEEP:  [5 cmH20] 5 cmH20 Plateau Pressure:  [20 cmH20-23 cmH20] 23 cmH20   Intake/Output Summary (Last 24 hours) at 10/20/2018 0811 Last data filed at 10/20/2018 0745 Gross per 24 hour  Intake 2032.02 ml  Output 1150 ml  Net 882.02 ml   Filed Weights   09/28/2018 1141 10/19/18 0500 10/20/18 0345  Weight: 77.1 kg 78.4 kg 81.7 kg     Examination:  General: Adult male, resting in bed, in NAD. Neuro: Sedated, not responsive.  No spontaneous respiratory effort. HEENT: [place]/AT. Sclerae anicteric, ETT in place. Cardiovascular: RRR, no M/R/G.  Lungs: Respirations even and unlabored.  CTA bilaterally, No W/R/R. Abdomen: BS x 4, soft, NT/ND.  Musculoskeletal: No gross deformities, no edema.  Skin: Intact, warm, no rashes.  Assessment & Plan:   PEA cardiac arrest. -etiology unclear: ? Seizure ? Primary cardiac event . Plan Continue supportive care No role for TTM MAP goal > 65, titrate NE for goal (currently off, appears to mainly have needed given high doses of propofol)  Acute hypoxic respiratory failure status post cardiac arrest. - history of small cell lung cancer and chronic right pleural effusion Portable chest x-ray personally reviewed: This shows continued right effusion -PCT 0.47 Plan Continue vent support No weaning given no respiratory effort Bronchial hygiene Awaiting daughters / family decision regarding terminal extubation CXR intermittently   Acute metabolic encephalopathy, exhibiting evidence of anoxic brain injury.  Severe myoclonus. Cerebral edema ? Anoxic related or associated w/ metastasis -EEG 3/31 some evidence of myogenic activity, front central delta slowing seen with toxic metabolic processes   -MRI Brain 4/1 numerous brain mets, some hemorrhagic  Plan Continue decadron IV Neuro following, appreciate recs Keppra BID  Propofol / versed  AoCKD. Plan Follow BMP  Mild leukocytosis - likely reactive / due to steroids. Plan No interventions required  Anemia, macrocytic Plan Continue B vitamin supplementation Trend CBC  Transfuse for hemoglobin less than 7  History of diabetes with hyperglycemia Plan Sliding scale insulin   Best practice:  Diet: NPO Pain/Anxiety/Delirium protocol (if indicated): PAD VAP protocol (if indicated): 3/31 DVT prophylaxis: SCD GI prophylaxis: PPI  Glucose control: ssi Mobility: BR Code Status: DNR  Family Communication: updated daughter Arthur [name] [phone].   She also discussed with Arthur [name] 4/1.  She is going to discuss with one more family member today 4/2 and will call RN back with plan of care.  Recommended full comfort measures which she is leaning towards. Disposition: Continue ICU level of care.   Critical care time: 40 minutes     Montey Hora, [facility] Pager: [phone].  If no answer, [phone] 10/20/2018, 8:22 AM

## 2018-10-20 NOTE — Progress Notes (Signed)
78 year old man with metastatic small cell lung cancer admitted with PEA arrest on 3/31, unclear downtime.  He had severe myoclonus requiring propofol coma. Head CT and MRI confirmed multiple brain metastases.  EEG showed theta activity during myoclonus but no obvious epileptiform activity  Propofol has been decreased to 20 mics so far this morning and no seizure activity so far. He is off Levophed. On exam-comatose, no response to verbal commands or deep pain stimulus, orally intubated, clear breath sounds bilateral, S1-S2 regular sinus on monitor, soft nontender abdomen, 1+ edema.   Chest x-ray 4/2 was personally reviewed which shows slight increase right lower lobe airspace disease/effusion  Labs show worsening creatinine to 2.4, normal electrolytes, creasing leukocytosis.  Impression/plan  Severe myoclonus concerning for anoxic encephalopathy-propofol being tapered to off. Continue Keppra and low-dose Klonopin via tube. We will need neuro exam off propofol for neuro prognostication but myoclonic seizures is a poor sign.  If myoclonus recurs, then will need to load with Depakote and use Versed as needed Continue Decadron for brain metastases  Acute respiratory failure-no weaning as of now  PEA cardiac arrest-unclear whether this was primary cardiac or respiratory arrest  AKI -related to arrest.  Poor prognosis for meaningful recovery has been given to daughter.  She seems to be leaning towards terminal extubation.  Continue supportive care meantime.   The patient is critically ill with multiple organ systems failure and requires high complexity decision making for assessment and support, frequent evaluation and titration of therapies, application of advanced monitoring technologies and extensive interpretation of multiple databases. Critical Care Time devoted to patient care services described in this note independent of APP/resident  time is 32 minutes.   Leanna Sato Elsworth Soho MD

## 2018-10-20 NOTE — Progress Notes (Signed)
PCCM Interval Progress Note  Called pt's daughter, Sharyn Lull to discuss current circumstances.  We discussed pt's wishes under circumstances such as this.  Sharyn Lull has agreed to wait until tomorrow morning 4/3 and if still no improvement in mental status despite no sedation (propofol turned off this AM), then will move to withdrawal and comfort measures .   Montey Hora, [facility] Pager: [phone].  If no answer, [phone] 10/20/2018, 11:51 AM

## 2018-10-20 NOTE — Plan of Care (Signed)
  Problem: Elimination: Goal: Will not experience complications related to bowel motility Outcome: Progressing   Problem: Respiratory: Goal: Ability to maintain a clear airway and adequate ventilation will improve Outcome: Progressing

## 2018-10-20 NOTE — Progress Notes (Signed)
Subjective: Patient is sedated on 30 mcg/kg/minute of propofol.   Objective: Current vital signs: BP 131/75   Pulse 73   Temp (!) 96.5 F (35.8 C) (Oral)   Resp 18   Ht 6' (1.829 m)   Wt 81.7 kg   SpO2 100%   BMI 24.43 kg/m  Vital signs in last 24 hours: Temp:  [96.5 F (35.8 C)-99.2 F (37.3 C)] 96.5 F (35.8 C) (04/02 0745) Pulse Rate:  [69-95] 73 (04/02 0737) Resp:  [18-21] 18 (04/02 0745) BP: (86-137)/(58-84) 131/75 (04/02 0745) SpO2:  [98 %-100 %] 100 % (04/02 0745) FiO2 (%):  [40 %] 40 % (04/02 0737) Weight:  [81.7 kg] 81.7 kg (04/02 0345)  Intake/Output from previous day: 04/01 0701 - 04/02 0700 In: 1987.6 [I.V.:1697.2; NG/GT:90; IV Piggyback:200.3] Out: 1150 [Urine:1050; Stool:100] Intake/Output this shift: Total I/O In: 119.8 [I.V.:119.8] Out: -  Nutritional status:  Diet Order            Diet NPO time specified  Diet effective now              Neurologic Exam: Sedated on 30 mcg/kg/hr of propofol Ment: No spontaneous eye opening or to stimuli. No extremity movement to any stimuli. No response to verbal questions or commands.  CN: Pupils unreactive. Weak doll's eye reflex. Eyes conjugate at midline with no nystagmus. Face flaccidly symmetric.  Motor/Sensory: Flaccid tone x 4. No asymmetry. No movement of extremities to any stimuli except for weak dorsiflexion at ankles with plantar stimulation Reflexes: Trace brachioradialis reflexes bilaterally. Areflexic patellae and achilles reflexes. Toes mute.   Lab Results: Results for orders placed or performed during the hospital encounter of 10/01/2018 (from the past 48 hour(s))  CBC with Differential/Platelet     Status: Abnormal   Collection Time: 09/22/2018 11:43 AM  Result Value Ref Range   WBC 10.6 (H) 4.0 - 10.5 K/uL   RBC 3.60 (L) 4.22 - 5.81 MIL/uL   Hemoglobin 10.8 (L) 13.0 - 17.0 g/dL   HCT 36.6 (L) 39.0 - 52.0 %   MCV 101.7 (H) 80.0 - 100.0 fL   MCH 30.0 26.0 - 34.0 pg   MCHC 29.5 (L) 30.0 - 36.0  g/dL   RDW 12.7 11.5 - 15.5 %   Platelets 125 (L) 150 - 400 K/uL   nRBC 0.0 0.0 - 0.2 %   Neutrophils Relative % 56 %   Neutro Abs 6.0 1.7 - 7.7 K/uL   Lymphocytes Relative 36 %   Lymphs Abs 3.8 0.7 - 4.0 K/uL   Monocytes Relative 4 %   Monocytes Absolute 0.4 0.1 - 1.0 K/uL   Eosinophils Relative 1 %   Eosinophils Absolute 0.1 0.0 - 0.5 K/uL   Basophils Relative 0 %   Basophils Absolute 0.0 0.0 - 0.1 K/uL   Immature Granulocytes 3 %   Abs Immature Granulocytes 0.32 (H) 0.00 - 0.07 K/uL    Comment: Performed at [facility], [address] [place], [place] [address]  Protime-INR     Status: Abnormal   Collection Time: 10/15/2018 11:43 AM  Result Value Ref Range   Prothrombin Time 18.0 (H) 11.4 - 15.2 seconds   INR 1.5 (H) 0.8 - 1.2    Comment: (NOTE) INR goal varies based on device and disease states. Performed at [facility], [place] [address], [place], [place] [address]   APTT     Status: Abnormal   Collection Time: 09/27/2018 11:43 AM  Result Value Ref Range  aPTT 53 (H) 24 - 36 seconds    Comment:        IF BASELINE aPTT IS ELEVATED, SUGGEST PATIENT RISK ASSESSMENT BE USED TO DETERMINE APPROPRIATE ANTICOAGULANT THERAPY. Performed at [facility], [place] [address] [place], [place] [address]   Comprehensive metabolic panel     Status: Abnormal   Collection Time: 10/10/2018 11:43 AM  Result Value Ref Range   Sodium 138 135 - 145 mmol/L   Potassium 4.3 3.5 - 5.1 mmol/L   Chloride 103 98 - 111 mmol/L   CO2 12 (L) 22 - 32 mmol/L   Glucose, Bld 281 (H) 70 - 99 mg/dL   BUN 20 8 - 23 mg/dL   Creatinine, Ser 1.74 (H) 0.61 - 1.24 mg/dL   Calcium 9.0 8.9 - 10.3 mg/dL   Total Protein 6.3 (L) 6.5 - 8.1 g/dL   Albumin 3.2 (L) 3.5 - 5.0 g/dL   AST 64 (H) 15 - 41 U/L   ALT 32 0 - 44 U/L   Alkaline Phosphatase 81 38 - 126 U/L   Total Bilirubin 0.8 0.3 - 1.2 mg/dL   GFR calc non Af Amer 36 (L) >60 mL/min   GFR calc Af Amer 42 (L) >60 mL/min   Anion gap 23 (H) 5 -  15    Comment: Performed at [facility], [place] [address] [place], [place] [address]  Troponin I - ONCE - STAT     Status: None   Collection Time: 10/15/2018 11:43 AM  Result Value Ref Range   Troponin I <0.03 <0.03 ng/mL    Comment: Performed at [facility] [address] [place], [place] [address]  Lipid panel     Status: Abnormal   Collection Time: 09/24/2018 11:43 AM  Result Value Ref Range   Cholesterol 92 0 - 200 mg/dL   Triglycerides 132 <150 mg/dL   HDL 31 (L) >40 mg/dL   Total CHOL/HDL Ratio 3.0 RATIO   VLDL 26 0 - 40 mg/dL   LDL Cholesterol 35 0 - 99 mg/dL    Comment:        Total Cholesterol/HDL:CHD Risk Coronary Heart Disease Risk Table                     Men   Women  1/2 Average Risk   3.4   3.3  Average Risk       5.0   4.4  2 X Average Risk   9.6   7.1  3 X Average Risk  23.4   11.0        Use the calculated Patient Ratio above and the CHD Risk Table to determine the patient's CHD Risk.        ATP III CLASSIFICATION (LDL):  <100     mg/dL   Optimal  100-129  mg/dL   Near or Above                    Optimal  130-159  mg/dL   Borderline  160-189  mg/dL   High  >190     mg/dL   Very High Performed at [facility] [address], [place], [place] [address]   Magnesium     Status: None   Collection Time: 09/20/2018 11:43 AM  Result Value Ref Range   Magnesium 1.8 1.7 - 2.4 mg/dL    Comment: Performed at [facility] [address] [place], [place]  [address]  CBG monitoring, ED     Status: Abnormal   Collection Time: 09/20/2018 11:43 AM  Result Value Ref Range   Glucose-Capillary 222 (H) 70 - 99 mg/dL   Comment 1 Notify RN    Comment 2 Document in Chart   I-STAT 7, (LYTES, BLD GAS, ICA, H+H)     Status: Abnormal   Collection Time: 10/13/2018  1:39 PM  Result Value Ref Range   pH, Arterial 7.149 (LL) 7.350 - 7.450   pCO2 arterial 48.5 (H) 32.0 - 48.0 mmHg   pO2, Arterial 277.0 (H) 83.0 - 108.0 mmHg   Bicarbonate 16.9 (L) 20.0 - 28.0  mmol/L   TCO2 18 (L) 22 - 32 mmol/L   O2 Saturation 100.0 %   Acid-base deficit 12.0 (H) 0.0 - 2.0 mmol/L   Sodium 139 135 - 145 mmol/L   Potassium 3.2 (L) 3.5 - 5.1 mmol/L   Calcium, Ion 1.18 1.15 - 1.40 mmol/L   HCT 32.0 (L) 39.0 - 52.0 %   Hemoglobin 10.9 (L) 13.0 - 17.0 g/dL   Patient temperature HIDE    Collection site RADIAL, ALLEN'S TEST ACCEPTABLE    Drawn by RT    Sample type ARTERIAL    Comment NOTIFIED PHYSICIAN   Culture, blood (routine x 2)     Status: None (Preliminary result)   Collection Time: 10/13/2018  3:03 PM  Result Value Ref Range   Specimen Description BLOOD LEFT HAND    Special Requests      BOTTLES DRAWN AEROBIC ONLY Blood Culture results may not be optimal due to an inadequate volume of blood received in culture bottles   Culture      NO GROWTH < 24 HOURS Performed at [facility], [address], [place], [place] [address]    Report Status PENDING   Culture, blood (routine x 2)     Status: None (Preliminary result)   Collection Time: 09/29/2018  3:10 PM  Result Value Ref Range   Specimen Description BLOOD RIGHT HAND    Special Requests      BOTTLES DRAWN AEROBIC ONLY Blood Culture results may not be optimal due to an inadequate volume of blood received in culture bottles   Culture      NO GROWTH < 24 HOURS Performed at [facility] [address] [place], [place] [address]    Report Status PENDING   Lactic acid, plasma     Status: Abnormal   Collection Time: 09/24/2018  3:20 PM  Result Value Ref Range   Lactic Acid, Venous 9.3 (HH) 0.5 - 1.9 mmol/L    Comment: CRITICAL RESULT CALLED TO, READ BACK BY AND VERIFIED WITH: A PAYNE RN AT [id number] ON [id number] BY K FORSYTH Performed at [facility], [place] [address] [place], [place] [address]   Procalcitonin     Status: None   Collection Time: 10/14/2018  3:20 PM  Result Value Ref Range   Procalcitonin 0.47 ng/mL    Comment:        Interpretation: PCT (Procalcitonin) <= 0.5 ng/mL: Systemic  infection (sepsis) is not likely. Local bacterial infection is possible. (NOTE)       Sepsis PCT Algorithm           Lower Respiratory Tract  Infection PCT Algorithm    ----------------------------     ----------------------------         PCT < 0.25 ng/mL                PCT < 0.10 ng/mL         Strongly encourage             Strongly discourage   discontinuation of antibiotics    initiation of antibiotics    ----------------------------     -----------------------------       PCT 0.25 - 0.50 ng/mL            PCT 0.10 - 0.25 ng/mL               OR       >80% decrease in PCT            Discourage initiation of                                            antibiotics      Encourage discontinuation           of antibiotics    ----------------------------     -----------------------------         PCT >= 0.50 ng/mL              PCT 0.26 - 0.50 ng/mL               AND        <80% decrease in PCT             Encourage initiation of                                             antibiotics       Encourage continuation           of antibiotics    ----------------------------     -----------------------------        PCT >= 0.50 ng/mL                  PCT > 0.50 ng/mL               AND         increase in PCT                  Strongly encourage                                      initiation of antibiotics    Strongly encourage escalation           of antibiotics                                     -----------------------------                                           PCT <= 0.25 ng/mL  OR                                        > 80% decrease in PCT                                     Discontinue / Do not initiate                                             antibiotics Performed at [facility], [place] [address] [place], [place] [address]   Urine culture     Status: None   Collection Time: 10/13/2018  3:31  PM  Result Value Ref Range   Specimen Description URINE, CATHETERIZED    Special Requests NONE    Culture      NO GROWTH Performed at [facility], [place] [address], [place],  [address]    Report Status 10/19/2018 FINAL   Strep pneumoniae urinary antigen  (not at [facility])     Status: None   Collection Time: 10/13/2018  3:31 PM  Result Value Ref Range   Strep Pneumo Urinary Antigen NEGATIVE NEGATIVE    Comment:        Infection due to S. pneumoniae cannot be absolutely ruled out since the antigen present may be below the detection limit of the test. Performed at [facility], [address], [place],  [address]   Legionella Pneumophila Serogp 1 Ur Ag     Status: None   Collection Time: 09/22/2018  3:31 PM  Result Value Ref Range   L. pneumophila Serogp 1 Ur Ag Negative Negative    Comment: (NOTE) Presumptive negative for L. pneumophila serogroup 1 antigen in urine, suggesting no recent or current infection. Legionnaires' disease cannot be ruled out since other serogroups and species may also cause disease. Performed At: [facility] [place], [place] [id number] Rush Farmer MD RF:[id number]    Source of Sample URINE, CATHETERIZED     Comment: Performed at [facility], [place] [address], [place], [place] [address]  Glucose, capillary     Status: Abnormal   Collection Time: 10/07/2018  3:31 PM  Result Value Ref Range   Glucose-Capillary 220 (H) 70 - 99 mg/dL  I-STAT 7, (LYTES, BLD GAS, ICA, H+H)     Status: Abnormal   Collection Time: 10/06/2018  3:55 PM  Result Value Ref Range   pH, Arterial 7.335 (L) 7.350 - 7.450   pCO2 arterial 31.8 (L) 32.0 - 48.0 mmHg   pO2, Arterial 181.0 (H) 83.0 - 108.0 mmHg   Bicarbonate 16.9 (L) 20.0 - 28.0 mmol/L   TCO2 18 (L) 22 - 32 mmol/L   O2 Saturation 100.0 %   Acid-base deficit 8.0 (H) 0.0 - 2.0 mmol/L   Sodium 137 135 - 145 mmol/L   Potassium 3.7 3.5 - 5.1 mmol/L   Calcium, Ion 1.18 1.15 -  1.40 mmol/L   HCT 29.0 (L) 39.0 - 52.0 %   Hemoglobin 9.9 (L) 13.0 - 17.0 g/dL   Patient temperature 98.6 F    Collection site RADIAL, ALLEN'S TEST ACCEPTABLE    Drawn by RT    Sample type  ARTERIAL   Respiratory Panel by PCR     Status: None   Collection Time: 09/19/2018  4:02 PM  Result Value Ref Range   Adenovirus NOT DETECTED NOT DETECTED   Coronavirus 229E NOT DETECTED NOT DETECTED    Comment: (NOTE) The Coronavirus on the Respiratory Panel, DOES NOT test for the novel  Coronavirus (2019 nCoV)    Coronavirus HKU1 NOT DETECTED NOT DETECTED   Coronavirus NL63 NOT DETECTED NOT DETECTED   Coronavirus OC43 NOT DETECTED NOT DETECTED   Metapneumovirus NOT DETECTED NOT DETECTED   Rhinovirus / Enterovirus NOT DETECTED NOT DETECTED   Influenza A NOT DETECTED NOT DETECTED   Influenza B NOT DETECTED NOT DETECTED   Parainfluenza Virus 1 NOT DETECTED NOT DETECTED   Parainfluenza Virus 2 NOT DETECTED NOT DETECTED   Parainfluenza Virus 3 NOT DETECTED NOT DETECTED   Parainfluenza Virus 4 NOT DETECTED NOT DETECTED   Respiratory Syncytial Virus NOT DETECTED NOT DETECTED   Bordetella pertussis NOT DETECTED NOT DETECTED   Chlamydophila pneumoniae NOT DETECTED NOT DETECTED   Mycoplasma pneumoniae NOT DETECTED NOT DETECTED    Comment: Performed at [facility], [place] [address] [place], [place] [address]  MRSA PCR Screening     Status: None   Collection Time: 09/22/2018  4:02 PM  Result Value Ref Range   MRSA by PCR NEGATIVE NEGATIVE    Comment:        The GeneXpert MRSA Assay (FDA approved for NASAL specimens only), is one component of a comprehensive MRSA colonization surveillance program. It is not intended to diagnose MRSA infection nor to guide or monitor treatment for MRSA infections. Performed at [facility], [place] [address], [place], [place] [address]   Glucose, capillary     Status: Abnormal   Collection Time: 09/30/2018  8:45 PM  Result Value Ref Range    Glucose-Capillary 174 (H) 70 - 99 mg/dL  Lactic acid, plasma     Status: Abnormal   Collection Time: 10/01/2018  9:00 PM  Result Value Ref Range   Lactic Acid, Venous 3.4 (HH) 0.5 - 1.9 mmol/L    Comment: CRITICAL RESULT CALLED TO, READ BACK BY AND VERIFIED WITH: B.RUDD,RN 2143 10/10/2018 M.CAMPBELL Performed at [facility], [place] [address], [place], [place] [address]   Glucose, capillary     Status: Abnormal   Collection Time: 10/19/18 12:06 AM  Result Value Ref Range   Glucose-Capillary 156 (H) 70 - 99 mg/dL  CBC     Status: Abnormal   Collection Time: 10/19/18  1:43 AM  Result Value Ref Range   WBC 13.0 (H) 4.0 - 10.5 K/uL   RBC 3.69 (L) 4.22 - 5.81 MIL/uL   Hemoglobin 11.1 (L) 13.0 - 17.0 g/dL   HCT 33.2 (L) 39.0 - 52.0 %   MCV 90.0 80.0 - 100.0 fL    Comment: REPEATED TO VERIFY DELTA CHECK NOTED    MCH 30.1 26.0 - 34.0 pg   MCHC 33.4 30.0 - 36.0 g/dL   RDW 13.0 11.5 - 15.5 %   Platelets 91 (L) 150 - 400 K/uL    Comment: REPEATED TO VERIFY PLATELET COUNT CONFIRMED BY SMEAR SPECIMEN CHECKED FOR CLOTS Immature Platelet Fraction may be clinically indicated, consider ordering this additional test [id number]    nRBC 0.0 0.0 - 0.2 %    Comment: Performed at [facility], [place] [address], [place], [place] [address]  Basic metabolic panel     Status: Abnormal  Collection Time: 10/19/18  1:43 AM  Result Value Ref Range   Sodium 134 (L) 135 - 145 mmol/L   Potassium 3.7 3.5 - 5.1 mmol/L   Chloride 105 98 - 111 mmol/L   CO2 15 (L) 22 - 32 mmol/L   Glucose, Bld 169 (H) 70 - 99 mg/dL   BUN 35 (H) 8 - 23 mg/dL   Creatinine, Ser 1.90 (H) 0.61 - 1.24 mg/dL   Calcium 8.8 (L) 8.9 - 10.3 mg/dL   GFR calc non Af Amer 33 (L) >60 mL/min   GFR calc Af Amer 38 (L) >60 mL/min   Anion gap 14 5 - 15    Comment: Performed at [facility] [address], [place], [place] [address]  Magnesium     Status: None   Collection Time: 10/19/18  1:43 AM  Result Value Ref Range    Magnesium 1.8 1.7 - 2.4 mg/dL    Comment: Performed at [facility] [address], [place], [place] [address]  Phosphorus     Status: None   Collection Time: 10/19/18  1:43 AM  Result Value Ref Range   Phosphorus 3.1 2.5 - 4.6 mg/dL    Comment: Performed at [facility] [address] [place], [place] [address]  Blood gas, arterial     Status: Abnormal   Collection Time: 10/19/18  4:25 AM  Result Value Ref Range   FIO2 50.00    Delivery systems VENTILATOR    Mode PRESSURE REGULATED VOLUME CONTROL    VT 600 mL   LHR 24 resp/min   Peep/cpap 5.0 cm H20   pH, Arterial 7.578 (H) 7.350 - 7.450   pCO2 arterial  32.0 - 48.0 mmHg    CRITICAL RESULT CALLED TO, READ BACK BY AND VERIFIED WITH:    Comment:  VALUES BELOW REPORTABLE RANGE  JESSICA HALEY RT AT 0430 ON [id number] BY CARRIE MCCOLLUM    pO2, Arterial 75.0 (L) 83.0 - 108.0 mmHg   Bicarbonate 15.9 (L) 20.0 - 28.0 mmol/L   Acid-base deficit 6.2 (H) 0.0 - 2.0 mmol/L   O2 Saturation 99.0 %   Patient temperature 95.5    Collection site LEFT RADIAL    Drawn by [id number]    Sample type ARTERIAL    Allens test (pass/fail) PASS PASS  Glucose, capillary     Status: Abnormal   Collection Time: 10/19/18  4:38 AM  Result Value Ref Range   Glucose-Capillary 163 (H) 70 - 99 mg/dL  I-STAT 7, (LYTES, BLD GAS, ICA, H+H)     Status: Abnormal   Collection Time: 10/19/18  7:57 AM  Result Value Ref Range   pH, Arterial 7.468 (H) 7.350 - 7.450   pCO2 arterial 23.4 (L) 32.0 - 48.0 mmHg   pO2, Arterial 127.0 (H) 83.0 - 108.0 mmHg   Bicarbonate 17.0 (L) 20.0 - 28.0 mmol/L   TCO2 18 (L) 22 - 32 mmol/L   O2 Saturation 99.0 %   Acid-base deficit 6.0 (H) 0.0 - 2.0 mmol/L   Sodium 137 135 - 145 mmol/L   Potassium 3.8 3.5 - 5.1 mmol/L   Calcium, Ion 1.68 (HH) 1.15 - 1.40 mmol/L   HCT 29.0 (L) 39.0 - 52.0 %   Hemoglobin 9.9 (L) 13.0 - 17.0 g/dL   Patient temperature 97.5 F    Collection site RADIAL, ALLEN'S TEST ACCEPTABLE    Drawn by RT     Sample type ARTERIAL    Comment NOTIFIED PHYSICIAN  Glucose, capillary     Status: Abnormal   Collection Time: 10/19/18  8:09 AM  Result Value Ref Range   Glucose-Capillary 140 (H) 70 - 99 mg/dL  Glucose, capillary     Status: Abnormal   Collection Time: 10/19/18 12:06 PM  Result Value Ref Range   Glucose-Capillary 154 (H) 70 - 99 mg/dL  Glucose, capillary     Status: Abnormal   Collection Time: 10/19/18  3:46 PM  Result Value Ref Range   Glucose-Capillary 135 (H) 70 - 99 mg/dL  Glucose, capillary     Status: Abnormal   Collection Time: 10/19/18  9:10 PM  Result Value Ref Range   Glucose-Capillary 153 (H) 70 - 99 mg/dL  Glucose, capillary     Status: Abnormal   Collection Time: 10/20/18 12:16 AM  Result Value Ref Range   Glucose-Capillary 178 (H) 70 - 99 mg/dL  Glucose, capillary     Status: Abnormal   Collection Time: 10/20/18  4:05 AM  Result Value Ref Range   Glucose-Capillary 155 (H) 70 - 99 mg/dL  Basic metabolic panel     Status: Abnormal   Collection Time: 10/20/18  6:02 AM  Result Value Ref Range   Sodium 136 135 - 145 mmol/L   Potassium 4.1 3.5 - 5.1 mmol/L   Chloride 109 98 - 111 mmol/L   CO2 16 (L) 22 - 32 mmol/L   Glucose, Bld 172 (H) 70 - 99 mg/dL   BUN 50 (H) 8 - 23 mg/dL   Creatinine, Ser 2.44 (H) 0.61 - 1.24 mg/dL   Calcium 8.4 (L) 8.9 - 10.3 mg/dL   GFR calc non Af Amer 24 (L) >60 mL/min   GFR calc Af Amer 28 (L) >60 mL/min   Anion gap 11 5 - 15    Comment: Performed at [facility], [address] [place], [place] [address]  CBC     Status: Abnormal   Collection Time: 10/20/18  6:02 AM  Result Value Ref Range   WBC 18.7 (H) 4.0 - 10.5 K/uL   RBC 3.35 (L) 4.22 - 5.81 MIL/uL   Hemoglobin 10.4 (L) 13.0 - 17.0 g/dL   HCT 30.8 (L) 39.0 - 52.0 %   MCV 91.9 80.0 - 100.0 fL   MCH 31.0 26.0 - 34.0 pg   MCHC 33.8 30.0 - 36.0 g/dL   RDW 13.8 11.5 - 15.5 %   Platelets 73 (L) 150 - 400 K/uL    Comment: REPEATED TO VERIFY Immature Platelet Fraction  may be clinically indicated, consider ordering this additional test [id number] CONSISTENT WITH PREVIOUS RESULT    nRBC 0.0 0.0 - 0.2 %    Comment: Performed at [facility], [place] [address] [place], [place] [address]  Lactic acid, plasma     Status: None   Collection Time: 10/20/18  6:02 AM  Result Value Ref Range   Lactic Acid, Venous 1.8 0.5 - 1.9 mmol/L    Comment: Performed at [facility] [address] [place], [place] [address]  Glucose, capillary     Status: Abnormal   Collection Time: 10/20/18  8:28 AM  Result Value Ref Range   Glucose-Capillary 161 (H) 70 - 99 mg/dL    Recent Results (from the past 240 hour(s))  Culture, blood (routine x 2)     Status: None (Preliminary result)   Collection Time: 09/22/2018  3:03 PM  Result Value Ref Range Status   Specimen Description BLOOD LEFT HAND  Final  Special Requests   Final    BOTTLES DRAWN AEROBIC ONLY Blood Culture results may not be optimal due to an inadequate volume of blood received in culture bottles   Culture   Final    NO GROWTH < 24 HOURS Performed at [facility] [address] [place], [place] [address]    Report Status PENDING  Incomplete  Culture, blood (routine x 2)     Status: None (Preliminary result)   Collection Time: 09/20/2018  3:10 PM  Result Value Ref Range Status   Specimen Description BLOOD RIGHT HAND  Final   Special Requests   Final    BOTTLES DRAWN AEROBIC ONLY Blood Culture results may not be optimal due to an inadequate volume of blood received in culture bottles   Culture   Final    NO GROWTH < 24 HOURS Performed at [facility], [place] [address] [place], [place] [address]    Report Status PENDING  Incomplete  Urine culture     Status: None   Collection Time: 09/27/2018  3:31 PM  Result Value Ref Range Status   Specimen Description URINE, CATHETERIZED  Final   Special Requests NONE  Final   Culture   Final    NO GROWTH Performed at [facility], [address], [place], [place] [address]    Report Status 10/19/2018 FINAL  Final  Respiratory Panel by PCR     Status: None   Collection Time: 10/17/2018  4:02 PM  Result Value Ref Range Status   Adenovirus NOT DETECTED NOT DETECTED Final   Coronavirus 229E NOT DETECTED NOT DETECTED Final    Comment: (NOTE) The Coronavirus on the Respiratory Panel, DOES NOT test for the novel  Coronavirus (2019 nCoV)    Coronavirus HKU1 NOT DETECTED NOT DETECTED Final   Coronavirus NL63 NOT DETECTED NOT DETECTED Final   Coronavirus OC43 NOT DETECTED NOT DETECTED Final   Metapneumovirus NOT DETECTED NOT DETECTED Final   Rhinovirus / Enterovirus NOT DETECTED NOT DETECTED Final   Influenza A NOT DETECTED NOT DETECTED Final   Influenza B NOT DETECTED NOT DETECTED Final   Parainfluenza Virus 1 NOT DETECTED NOT DETECTED Final   Parainfluenza Virus 2 NOT DETECTED NOT DETECTED Final   Parainfluenza Virus 3 NOT DETECTED NOT DETECTED Final   Parainfluenza Virus 4 NOT DETECTED NOT DETECTED Final   Respiratory Syncytial Virus NOT DETECTED NOT DETECTED Final   Bordetella pertussis NOT DETECTED NOT DETECTED Final   Chlamydophila pneumoniae NOT DETECTED NOT DETECTED Final   Mycoplasma pneumoniae NOT DETECTED NOT DETECTED Final    Comment: Performed at [facility], [address] [place], [place] [address]  MRSA PCR Screening     Status: None   Collection Time: 09/18/2018  4:02 PM  Result Value Ref Range Status   MRSA by PCR NEGATIVE NEGATIVE Final    Comment:        The GeneXpert MRSA Assay (FDA approved for NASAL specimens only), is one component of a comprehensive MRSA colonization surveillance program. It is not intended to diagnose MRSA infection nor to guide or monitor treatment for MRSA infections. Performed at [facility], [place] [address], [place], [place] [address]     Lipid Panel Recent Labs    10/17/2018 1143  CHOL 92  TRIG 132  HDL 31*  CHOLHDL 3.0  VLDL 26  LDLCALC 35     Studies/Results: Ct Head Wo Contrast  Result Date: 09/21/2018 CLINICAL  DATA:  Altered level of consciousness. EXAM: CT HEAD WITHOUT CONTRAST TECHNIQUE: Contiguous axial images were obtained from the base of the skull through the vertex without intravenous contrast. COMPARISON:  02/10/2017 MRI FINDINGS: Brain: There is concern for several slightly hyperdense lesions within both cerebral hemispheres. Surrounding edema noted in the left frontal and posterior right parietal lobes. Findings are concerning for metastases. Old lacunar infarct in the right periventricular white matter. There is atrophy and chronic small vessel disease changes. Vascular: No hyperdense vessel or unexpected calcification. Skull: No acute calvarial abnormality. Sinuses/Orbits: Visualized paranasal sinuses and mastoids clear. Orbital soft tissues unremarkable. Other: None IMPRESSION: Concern for several scattered hyperdense lesions within the cerebral hemispheres bilaterally concerning for metastases. Some of these areas demonstrate surrounding vasogenic edema. These could be further evaluated with MRI. Electronically Signed   By: Rolm Baptise M.D.   On: 10/05/2018 13:32   Mr Jeri Cos ML Contrast  Result Date: 10/19/2018 CLINICAL DATA:  Found unresponsive, status post CPR. History of metastatic lung cancer, hypertension, diabetes and stroke. EXAM: MRI HEAD WITHOUT AND WITH CONTRAST TECHNIQUE: Multiplanar, multiecho pulse sequences of the brain and surrounding structures were obtained without and with intravenous contrast. CONTRAST:  7.5 cc Gadavist COMPARISON:  CT HEAD October 18, 2018 and MRI of the head January 28, 2018. FINDINGS: INTRACRANIAL CONTENTS: Subcentimeter RIGHT cerebellar reduced diffusion with low ADC values. At least 8 infratentorial and 20 supratentorial enhancing metastasis with associated reduced diffusion seen with hypercellular tumor. Multiple metastasis demonstrate susceptibility artifact consistent with blood  products. Proportional vasogenic edema with regional mass effect. Dominant lesions include 18 mm LEFT basal ganglia and 16 mm LEFT cerebellum. No midline shift. Old bilateral basal ganglia lacunar infarcts. Patchy supratentorial white matter and pontine FLAIR T2 hyperintensities compatible with moderate chronic small vessel ischemic changes. No advanced parenchymal brain volume loss for age. No hydrocephalus. No abnormal extra-axial fluid collections. No abnormal extra-axial fluid collections. VASCULAR: Normal major intracranial vascular flow voids present at skull base. SKULL AND UPPER CERVICAL SPINE: No abnormal sellar expansion. Multiple cervical osseous metastasis. SINUSES/ORBITS: The mastoid air-cells and included paranasal sinuses are well-aerated.The included ocular globes and orbital contents are non-suspicious. OTHER: None. IMPRESSION: 1. At least 8 infratentorial and 20 supratentorial new intraparenchymal metastasis, some of which are hemorrhagic. Regional vasogenic edema and mass effect without midline shift. 2. Acute subcentimeter RIGHT cerebellar infarct. 3. Old basal ganglia lacunar infarcts and moderate chronic small vessel ischemic changes. Electronically Signed   By: Elon Alas M.D.   On: 10/19/2018 03:30   Dg Chest Port 1 View  Result Date: 10/20/2018 CLINICAL DATA:  ET tube, respiratory failure EXAM: PORTABLE CHEST 1 VIEW COMPARISON:  10/19/2018 FINDINGS: Support devices are stable. Cardiomegaly. Moderate right pleural effusion. Right lower lobe atelectasis or infiltrate. Effusion and airspace opacity have slightly increased since prior study. No confluent opacity on the left. No acute bony abnormality. IMPRESSION: Moderate right pleural effusion and right lower lobe airspace disease, both slightly increased since prior study. Electronically Signed   By: Rolm Baptise M.D.   On: 10/20/2018 07:42   Dg Chest Port 1 View  Result Date: 10/19/2018 CLINICAL DATA:  Acute respiratory failure  EXAM: PORTABLE CHEST 1 VIEW COMPARISON:  10/08/2018 FINDINGS: Interval repositioning of the NG tube into the stomach. Endotracheal tube is unchanged. Cardiomegaly with vascular congestion. Small right pleural effusion with right lower lobe atelectasis. Left lung clear. IMPRESSION: Cardiomegaly, vascular congestion. Small right effusion with right lower lobe atelectasis. Electronically Signed   By: Lennette Bihari  [place] M.D.   On: 10/19/2018 11:37   Dg Chest Port 1 View  Result Date: 09/28/2018 CLINICAL DATA:  Recent cardiac arrest EXAM: PORTABLE CHEST 1 VIEW COMPARISON:  09/29/2018 FINDINGS: Cardiac shadow is enlarged. Endotracheal tube is noted 2 cm above the carina. Nasogastric catheter is coiled within the proximal esophagus. Aortic calcifications are noted. Small right-sided pleural effusion is seen stable from prior CT examination. The lungs are well aerated without focal confluent infiltrate. Mild central vascular congestion is noted. Old rib fractures are noted on the left as well as a prior right scapular fracture stable from the previous exam. IMPRESSION: Endotracheal tube in satisfactory position. Gastric catheter is looped upon itself within the proximal esophagus. Mild vascular congestion and right-sided pleural effusion Electronically Signed   By: Inez Catalina M.D.   On: 09/20/2018 12:46    Medications:  Scheduled: . chlorhexidine gluconate (MEDLINE KIT)  15 mL Mouth Rinse BID  . clonazepam  0.5 mg Per Tube BID  . dexamethasone  4 mg Intravenous Q6H  . insulin aspart  0-20 Units Subcutaneous Q4H  . mouth rinse  15 mL Mouth Rinse 10 times per day  . pantoprazole (PROTONIX) IV  40 mg Intravenous Q24H   Continuous: . sodium chloride 50 mL/hr at 10/20/18 0745  . sodium chloride 10 mL/hr at 10/17/2018 2100  . levETIRAcetam Stopped (10/19/18 2119)  . norepinephrine (LEVOPHED) Adult infusion Stopped (10/19/18 1431)  . 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 Neurology was consulted for seizures versus myoclonus status post cardiac arrest. Unknown downtime; time from EMS to [place] was approximately 10 minutes. Multiple metastatic brain lesions were seen on CT and verified by MRI.  1. Myoclonus now resolved on propofol sedation and scheduled Keppra 2. MRI brain revealed at least 8 infratentorial and 20 supratentorial new intraparenchymal metastasis, some of which are hemorrhagic. There is regional vasogenic edema and mass effect without midline shift. A possible acute subcentimeter RIGHT cerebellar infarct versus metastatic lesion is noted. There are old basal ganglia lacunar infarcts and moderate chronic small vessel ischemic changes. 3. Prognosis likely to be poor from a neurological standpoint.   Recommendations: 1. Currently on propofol drip at 30 mcg/kg/min. Will need to switch to fentanyl in order to obtain a prognostic neurological exam (see below).  2. Continue Keppra 500 mg twice daily. 3. On Friday, 72 hours will have elapsed since the cardiac arrest and a repeat exam for prognostication can be made at that time. Will need to be off propofol for at least 4 hours prior to neurology reassessment. Would switch propofol to fentanyl for sedation at this time.  4. If stopping propofol results in recrudescence of myoclonus, call neurology for further recommendations, but most likely next step would be to load Depakote at 20 mg/kg and then start Depakote 500 mg twice daily.  35 minutes spent in the neurological evaluation and management of this critically ill patient.   LOS: 2 days   '@Electronically'$  signed: Dr. Kerney Elbe 10/20/2018  9:17 AM

## 2018-10-21 LAB — GLUCOSE, CAPILLARY
Glucose-Capillary: 127 mg/dL — ABNORMAL HIGH (ref 70–99)
Glucose-Capillary: 146 mg/dL — ABNORMAL HIGH (ref 70–99)
Glucose-Capillary: 179 mg/dL — ABNORMAL HIGH (ref 70–99)

## 2018-10-21 MED ORDER — DIPHENHYDRAMINE HCL 50 MG/ML IJ SOLN: 25.0000 mg | INTRAMUSCULAR | Status: DC | PRN

## 2018-10-21 MED ORDER — MORPHINE BOLUS VIA INFUSION
2.0000 mg | INTRAVENOUS | Status: DC | PRN
  Administered 2018-10-21 (×3): 2 mg via INTRAVENOUS
  Filled 2018-10-21: qty 2

## 2018-10-21 MED ORDER — DEXTROSE 5 % IV SOLN: INTRAVENOUS | Status: DC

## 2018-10-21 MED ORDER — POLYVINYL ALCOHOL 1.4 % OP SOLN: 1.0000 [drp] | Freq: Four times a day (QID) | OPHTHALMIC | Status: DC | PRN

## 2018-10-21 MED ORDER — ACETAMINOPHEN 650 MG RE SUPP: 650.0000 mg | Freq: Four times a day (QID) | RECTAL | Status: DC | PRN

## 2018-10-21 MED ORDER — ACETAMINOPHEN 325 MG PO TABS: 650.0000 mg | ORAL_TABLET | Freq: Four times a day (QID) | ORAL | Status: DC | PRN

## 2018-10-21 MED ORDER — GLYCOPYRROLATE 1 MG PO TABS
1.0000 mg | ORAL_TABLET | ORAL | Status: DC | PRN
  Filled 2018-10-21: qty 1

## 2018-10-21 MED ORDER — MORPHINE 100MG IN NS 100ML (1MG/ML) PREMIX INFUSION
0.0000 mg/h | INTRAVENOUS | Status: DC
  Administered 2018-10-21: 2 mg/h via INTRAVENOUS
  Filled 2018-10-21: qty 100

## 2018-10-21 MED ORDER — GLYCOPYRROLATE 0.2 MG/ML IJ SOLN
0.2000 mg | INTRAMUSCULAR | Status: DC | PRN
  Administered 2018-10-21: 11:00:00 0.2 mg via INTRAVENOUS
  Filled 2018-10-21: qty 1

## 2018-10-21 MED ORDER — GLYCOPYRROLATE 0.2 MG/ML IJ SOLN: 0.2000 mg | INTRAMUSCULAR | Status: DC | PRN

## 2018-10-23 LAB — CULTURE, BLOOD (ROUTINE X 2)
Culture: NO GROWTH
Culture: NO GROWTH

## 2018-10-25 ENCOUNTER — Inpatient Hospital Stay: Payer: Medicare Other

## 2018-10-25 ENCOUNTER — Inpatient Hospital Stay: Payer: Medicare Other | Admitting: Internal Medicine

## 2018-11-15 ENCOUNTER — Ambulatory Visit: Payer: Medicare Other

## 2018-11-15 ENCOUNTER — Ambulatory Visit: Payer: Medicare Other | Admitting: Internal Medicine

## 2018-11-15 ENCOUNTER — Other Ambulatory Visit: Payer: Medicare Other

## 2018-11-18 NOTE — Progress Notes (Signed)
Wasted remainder Morphine (est 60 ml) with Robinette Haines, RN.

## 2018-11-18 NOTE — Death Summary Note (Signed)
DEATH SUMMARY   Patient Details  Name: Arthur [name] MRN: [id number] DOB: [birth year]  Admission/Discharge Information   Admit Date:  11/04/2018  Date of Death: Date of Death: 07-Nov-2018  Time of Death: Time of Death: Oct 15, 1318  Length of Stay: 3  Referring Physician: Dione Housekeeper, MD   Reason(s) for Hospitalization  Cardiac arrest  Diagnoses  Preliminary cause of death:  Secondary Diagnoses (including complications and co-morbidities):  Active Problems:   Cardiac arrest (HCC)   Pressure injury of skin Metastatic small cell lung cancer Anoxic myoclonus  Brief Hospital Course (including significant findings, care, treatment, and services provided and events leading to death)  Arthur [name] is a 78 y.o. year old male who suffered an unwitnessed PEA arrest. He had severe myoclonus on presentation consistent with anoxic encephalopathy. Given his comorbidities and poor neurological recovery he was compassionately extubated and passed away.    Pertinent Labs and Studies  Significant Diagnostic Studies Ct Head Wo Contrast  Result Date: 11-04-2018 CLINICAL DATA:  Altered level of consciousness. EXAM: CT HEAD WITHOUT CONTRAST TECHNIQUE: Contiguous axial images were obtained from the base of the skull through the vertex without intravenous contrast. COMPARISON:  02/10/2017 MRI FINDINGS: Brain: There is concern for several slightly hyperdense lesions within both cerebral hemispheres. Surrounding edema noted in the left frontal and posterior right parietal lobes. Findings are concerning for metastases. Old lacunar infarct in the right periventricular white matter. There is atrophy and chronic small vessel disease changes. Vascular: No hyperdense vessel or unexpected calcification. Skull: No acute calvarial abnormality. Sinuses/Orbits: Visualized paranasal sinuses and mastoids clear. Orbital soft tissues unremarkable. Other: None IMPRESSION: Concern for several scattered hyperdense lesions within  the cerebral hemispheres bilaterally concerning for metastases. Some of these areas demonstrate surrounding vasogenic edema. These could be further evaluated with MRI. Electronically Signed   By: Rolm Baptise M.D.   On: 04-Nov-2018 13:32   Ct Chest W Contrast  Result Date: 09/29/2018 CLINICAL DATA:  Small-cell lung cancer. Right upper lobe lung mass with extension to hilum. Diagnosed in August of 2019. Chemotherapy. Evaluate treatment response. EXAM: CT CHEST, ABDOMEN, AND PELVIS WITH CONTRAST TECHNIQUE: Multidetector CT imaging of the chest, abdomen and pelvis was performed following the standard protocol during bolus administration of intravenous contrast. CONTRAST:  172mL OMNIPAQUE IOHEXOL 300 MG/ML  SOLN COMPARISON:  08/01/2018 FINDINGS: CT CHEST FINDINGS Cardiovascular: Advanced aortic and branch vessel atherosclerosis. Ulcer plaque in the descending thoracic aorta. Mild cardiomegaly with similar small pericardial effusion. Multivessel coronary artery atherosclerosis. No central pulmonary embolism, on this non-dedicated study. Mediastinum/Nodes: Bilateral thyroid nodules are similar and of doubtful clinical significance. No mediastinal adenopathy. Right infrahilar node of 9 mm is borderline sized and not significantly changed. Esophageal fluid level on image 17/2. Lungs/Pleura: Right-sided pleural effusion is decreased in small. No left effusion. Azygos fissure.  Probable secretions in the dependent trachea. Improved right lower lobe atelectasis. Musculoskeletal: Similar widespread sclerotic osseous metastasis. Remote fracture involving the right scapular body. Remote left rib fractures. CT ABDOMEN PELVIS FINDINGS Hepatobiliary: Normal liver. Multiple small gallstones without acute cholecystitis or biliary duct dilatation. Pancreas: Pancreatic atrophy, without acute inflammation or duct dilatation. Spleen: Old granulomatous disease in the spleen. Adrenals/Urinary Tract: Normal right adrenal gland. New left  adrenal nodularity including at 1.7 cm on image 60/2. Bilateral too small to characterize renal lesions, without hydronephrosis. The left ureter is borderline prominent, without cause identified. There is left perinephric nodularity, consistent with metastatic disease. Example medially at 1.6 cm on image 79/2, new. Anteriorly  at 7 mm on image 79/2. Median lobe impression into the urinary bladder is unchanged and slightly asymmetric to the left. Stomach/Bowel: Normal stomach, without wall thickening. Extensive colonic diverticulosis. Normal small bowel. Vascular/Lymphatic: Aortic and branch vessel atherosclerosis. Left perinephric nodularity/adenopathy. No other abdominopelvic adenopathy identified. Reproductive: Moderate prostatomegaly. Other: No significant free fluid. Musculoskeletal: Similar widespread sclerotic osseous metastasis. A mild L1 superior endplate compression deformity at is without ventral canal encroachment and not significantly changed. IMPRESSION: 1. Disease progression, as evidenced by new left adrenal and perinephric nodularity indicative of soft tissue metastasis. 2. Similar osseous metastasis. 3. Decrease in right pleural effusion with improved atelectasis. 4. Aortic atherosclerosis (ICD10-I70.0), coronary artery atherosclerosis and emphysema (ICD10-J43.9). 5. Similar small pericardial effusion. 6. Cholelithiasis. 7. Esophageal air fluid level suggests dysmotility or gastroesophageal reflux. Electronically Signed   By: Abigail Miyamoto M.D.   On: 09/29/2018 16:42   Arthur [name] NK Contrast  Result Date: 10/19/2018 CLINICAL DATA:  Found unresponsive, status post CPR. History of metastatic lung cancer, hypertension, diabetes and stroke. EXAM: MRI HEAD WITHOUT AND WITH CONTRAST TECHNIQUE: Multiplanar, multiecho pulse sequences of the brain and surrounding structures were obtained without and with intravenous contrast. CONTRAST:  7.5 cc Gadavist COMPARISON:  CT HEAD October 18, 2018 and MRI of the head  January 28, 2018. FINDINGS: INTRACRANIAL CONTENTS: Subcentimeter RIGHT cerebellar reduced diffusion with low ADC values. At least 8 infratentorial and 20 supratentorial enhancing metastasis with associated reduced diffusion seen with hypercellular tumor. Multiple metastasis demonstrate susceptibility artifact consistent with blood products. Proportional vasogenic edema with regional mass effect. Dominant lesions include 18 mm LEFT basal ganglia and 16 mm LEFT cerebellum. No midline shift. Old bilateral basal ganglia lacunar infarcts. Patchy supratentorial white matter and pontine FLAIR T2 hyperintensities compatible with moderate chronic small vessel ischemic changes. No advanced parenchymal brain volume loss for age. No hydrocephalus. No abnormal extra-axial fluid collections. No abnormal extra-axial fluid collections. VASCULAR: Normal major intracranial vascular flow voids present at skull base. SKULL AND UPPER CERVICAL SPINE: No abnormal sellar expansion. Multiple cervical osseous metastasis. SINUSES/ORBITS: The mastoid air-cells and included paranasal sinuses are well-aerated.The included ocular globes and orbital contents are non-suspicious. OTHER: None. IMPRESSION: 1. At least 8 infratentorial and 20 supratentorial new intraparenchymal metastasis, some of which are hemorrhagic. Regional vasogenic edema and mass effect without midline shift. 2. Acute subcentimeter RIGHT cerebellar infarct. 3. Old basal ganglia lacunar infarcts and moderate chronic small vessel ischemic changes. Electronically Signed   By: Elon Alas M.D.   On: 10/19/2018 03:30   Ct Abdomen Pelvis W Contrast  Result Date: 09/29/2018 CLINICAL DATA:  Small-cell lung cancer. Right upper lobe lung mass with extension to hilum. Diagnosed in August of 2019. Chemotherapy. Evaluate treatment response. EXAM: CT CHEST, ABDOMEN, AND PELVIS WITH CONTRAST TECHNIQUE: Multidetector CT imaging of the chest, abdomen and pelvis was performed following the  standard protocol during bolus administration of intravenous contrast. CONTRAST:  152mL OMNIPAQUE IOHEXOL 300 MG/ML  SOLN COMPARISON:  08/01/2018 FINDINGS: CT CHEST FINDINGS Cardiovascular: Advanced aortic and branch vessel atherosclerosis. Ulcer plaque in the descending thoracic aorta. Mild cardiomegaly with similar small pericardial effusion. Multivessel coronary artery atherosclerosis. No central pulmonary embolism, on this non-dedicated study. Mediastinum/Nodes: Bilateral thyroid nodules are similar and of doubtful clinical significance. No mediastinal adenopathy. Right infrahilar node of 9 mm is borderline sized and not significantly changed. Esophageal fluid level on image 17/2. Lungs/Pleura: Right-sided pleural effusion is decreased in small. No left effusion. Azygos fissure.  Probable secretions in the dependent trachea. Improved  right lower lobe atelectasis. Musculoskeletal: Similar widespread sclerotic osseous metastasis. Remote fracture involving the right scapular body. Remote left rib fractures. CT ABDOMEN PELVIS FINDINGS Hepatobiliary: Normal liver. Multiple small gallstones without acute cholecystitis or biliary duct dilatation. Pancreas: Pancreatic atrophy, without acute inflammation or duct dilatation. Spleen: Old granulomatous disease in the spleen. Adrenals/Urinary Tract: Normal right adrenal gland. New left adrenal nodularity including at 1.7 cm on image 60/2. Bilateral too small to characterize renal lesions, without hydronephrosis. The left ureter is borderline prominent, without cause identified. There is left perinephric nodularity, consistent with metastatic disease. Example medially at 1.6 cm on image 79/2, new. Anteriorly at 7 mm on image 79/2. Median lobe impression into the urinary bladder is unchanged and slightly asymmetric to the left. Stomach/Bowel: Normal stomach, without wall thickening. Extensive colonic diverticulosis. Normal small bowel. Vascular/Lymphatic: Aortic and branch  vessel atherosclerosis. Left perinephric nodularity/adenopathy. No other abdominopelvic adenopathy identified. Reproductive: Moderate prostatomegaly. Other: No significant free fluid. Musculoskeletal: Similar widespread sclerotic osseous metastasis. A mild L1 superior endplate compression deformity at is without ventral canal encroachment and not significantly changed. IMPRESSION: 1. Disease progression, as evidenced by new left adrenal and perinephric nodularity indicative of soft tissue metastasis. 2. Similar osseous metastasis. 3. Decrease in right pleural effusion with improved atelectasis. 4. Aortic atherosclerosis (ICD10-I70.0), coronary artery atherosclerosis and emphysema (ICD10-J43.9). 5. Similar small pericardial effusion. 6. Cholelithiasis. 7. Esophageal air fluid level suggests dysmotility or gastroesophageal reflux. Electronically Signed   By: Abigail Miyamoto M.D.   On: 09/29/2018 16:42   Dg Chest Port 1 View  Result Date: 10/20/2018 CLINICAL DATA:  ET tube, respiratory failure EXAM: PORTABLE CHEST 1 VIEW COMPARISON:  10/19/2018 FINDINGS: Support devices are stable. Cardiomegaly. Moderate right pleural effusion. Right lower lobe atelectasis or infiltrate. Effusion and airspace opacity have slightly increased since prior study. No confluent opacity on the left. No acute bony abnormality. IMPRESSION: Moderate right pleural effusion and right lower lobe airspace disease, both slightly increased since prior study. Electronically Signed   By: Rolm Baptise M.D.   On: 10/20/2018 07:42   Dg Chest Port 1 View  Result Date: 10/19/2018 CLINICAL DATA:  Acute respiratory failure EXAM: PORTABLE CHEST 1 VIEW COMPARISON:  10/12/2018 FINDINGS: Interval repositioning of the NG tube into the stomach. Endotracheal tube is unchanged. Cardiomegaly with vascular congestion. Small right pleural effusion with right lower lobe atelectasis. Left lung clear. IMPRESSION: Cardiomegaly, vascular congestion. Small right effusion  with right lower lobe atelectasis. Electronically Signed   By: Rolm Baptise M.D.   On: 10/19/2018 11:37   Dg Chest Port 1 View  Result Date: 10/16/2018 CLINICAL DATA:  Recent cardiac arrest EXAM: PORTABLE CHEST 1 VIEW COMPARISON:  09/29/2018 FINDINGS: Cardiac shadow is enlarged. Endotracheal tube is noted 2 cm above the carina. Nasogastric catheter is coiled within the proximal esophagus. Aortic calcifications are noted. Small right-sided pleural effusion is seen stable from prior CT examination. The lungs are well aerated without focal confluent infiltrate. Mild central vascular congestion is noted. Old rib fractures are noted on the left as well as a prior right scapular fracture stable from the previous exam. IMPRESSION: Endotracheal tube in satisfactory position. Gastric catheter is looped upon itself within the proximal esophagus. Mild vascular congestion and right-sided pleural effusion Electronically Signed   By: Inez Catalina M.D.   On: 10/17/2018 12:46    Microbiology Recent Results (from the past 240 hour(s))  Culture, blood (routine x 2)     Status: None   Collection Time: 09/28/2018  3:03 PM  Result Value Ref Range Status   Specimen Description BLOOD LEFT HAND  Final   Special Requests   Final    BOTTLES DRAWN AEROBIC ONLY Blood Culture results may not be optimal due to an inadequate volume of blood received in culture bottles   Culture   Final    NO GROWTH 5 DAYS Performed at [facility] [address], [place], [place] [address]    Report Status 10/23/2018 FINAL  Final  Culture, blood (routine x 2)     Status: None   Collection Time: 09/23/2018  3:10 PM  Result Value Ref Range Status   Specimen Description BLOOD RIGHT HAND  Final   Special Requests   Final    BOTTLES DRAWN AEROBIC ONLY Blood Culture results may not be optimal due to an inadequate volume of blood received in culture bottles   Culture   Final    NO GROWTH 5 DAYS Performed at [facility], [place]  [address] [place], [place] [address]    Report Status 10/23/2018 FINAL  Final  Urine culture     Status: None   Collection Time: 09/19/2018  3:31 PM  Result Value Ref Range Status   Specimen Description URINE, CATHETERIZED  Final   Special Requests NONE  Final   Culture   Final    NO GROWTH Performed at [facility], [place] [address], [place], [place] [address]    Report Status 10/19/2018 FINAL  Final  Respiratory Panel by PCR     Status: None   Collection Time: 09/27/2018  4:02 PM  Result Value Ref Range Status   Adenovirus NOT DETECTED NOT DETECTED Final   Coronavirus 229E NOT DETECTED NOT DETECTED Final    Comment: (NOTE) The Coronavirus on the Respiratory Panel, DOES NOT test for the novel  Coronavirus (2019 nCoV)    Coronavirus HKU1 NOT DETECTED NOT DETECTED Final   Coronavirus NL63 NOT DETECTED NOT DETECTED Final   Coronavirus OC43 NOT DETECTED NOT DETECTED Final   Metapneumovirus NOT DETECTED NOT DETECTED Final   Rhinovirus / Enterovirus NOT DETECTED NOT DETECTED Final   Influenza A NOT DETECTED NOT DETECTED Final   Influenza B NOT DETECTED NOT DETECTED Final   Parainfluenza Virus 1 NOT DETECTED NOT DETECTED Final   Parainfluenza Virus 2 NOT DETECTED NOT DETECTED Final   Parainfluenza Virus 3 NOT DETECTED NOT DETECTED Final   Parainfluenza Virus 4 NOT DETECTED NOT DETECTED Final   Respiratory Syncytial Virus NOT DETECTED NOT DETECTED Final   Bordetella pertussis NOT DETECTED NOT DETECTED Final   Chlamydophila pneumoniae NOT DETECTED NOT DETECTED Final   Mycoplasma pneumoniae NOT DETECTED NOT DETECTED Final    Comment: Performed at [facility], [address] [place], [place] [address]  MRSA PCR Screening     Status: None   Collection Time: 10/03/2018  4:02 PM  Result Value Ref Range Status   MRSA by PCR NEGATIVE NEGATIVE Final    Comment:        The GeneXpert MRSA Assay (FDA approved for NASAL specimens only), is one component of a comprehensive MRSA  colonization surveillance program. It is not intended to diagnose MRSA infection nor to guide or monitor treatment for MRSA infections. Performed at [facility], [place] [address], [place], [place] [address]     Lab Basic Metabolic Panel: Recent Labs  Lab 10/01/2018 1143 10/03/2018 1339 09/26/2018 1555 10/19/18 0143 10/19/18 0757 10/20/18 0602  NA 138 139 137 134*  137 136  K 4.3 3.2* 3.7 3.7 3.8 4.1  CL 103  --   --  105  --  109  CO2 12*  --   --  15*  --  16*  GLUCOSE 281*  --   --  169*  --  172*  BUN 20  --   --  35*  --  50*  CREATININE 1.74*  --   --  1.90*  --  2.44*  CALCIUM 9.0  --   --  8.8*  --  8.4*  MG 1.8  --   --  1.8  --   --   PHOS  --   --   --  3.1  --   --    Liver Function Tests: Recent Labs  Lab 10/06/2018 1143  AST 64*  ALT 32  ALKPHOS 81  BILITOT 0.8  PROT 6.3*  ALBUMIN 3.2*   No results for input(s): LIPASE, AMYLASE in the last 168 hours. No results for input(s): AMMONIA in the last 168 hours. CBC: Recent Labs  Lab 09/26/2018 1143 10/11/2018 1339 09/20/2018 1555 10/19/18 0143 10/19/18 0757 10/20/18 0602  WBC 10.6*  --   --  13.0*  --  18.7*  NEUTROABS 6.0  --   --   --   --   --   HGB 10.8* 10.9* 9.9* 11.1* 9.9* 10.4*  HCT 36.6* 32.0* 29.0* 33.2* 29.0* 30.8*  MCV 101.7*  --   --  90.0  --  91.9  PLT 125*  --   --  91*  --  73*   Cardiac Enzymes: Recent Labs  Lab 09/18/2018 1143  TROPONINI <0.03   Sepsis Labs: Recent Labs  Lab 10/02/2018 1143 09/22/2018 1520 09/18/2018 2100 10/19/18 0143 10/20/18 0602  PROCALCITON  --  0.47  --   --   --   WBC 10.6*  --   --  13.0* 18.7*  LATICACIDVEN  --  9.3* 3.4*  --  1.8    Procedures/Operations  Mechanical ventilation   Deziyah Arvin 10/24/2018, 10:08 AM

## 2018-11-18 NOTE — Progress Notes (Signed)
Subjective: Family wishes comfort care.  Objective: Current vital signs: BP (!) 148/84   Pulse 92   Temp 98.2 F (36.8 C) (Oral)   Resp (!) 26   Ht 6' (1.829 m)   Wt 75 kg   SpO2 98%   BMI 22.42 kg/m  Vital signs in last 24 hours: Temp:  [97.7 F (36.5 C)-98.3 F (36.8 C)] 98.2 F (36.8 C) (04/03 0400) Pulse Rate:  [90-94] 92 (04/03 0730) Resp:  [18-26] 26 (04/03 1100) BP: (144-166)/(68-93) 148/84 (04/03 1100) SpO2:  [96 %-100 %] 98 % (04/03 1100) FiO2 (%):  [40 %] 40 % (04/03 0730) Weight:  [75 kg] 75 kg (04/03 0346)  Intake/Output from previous day: 04/02 0701 - 04/03 0700 In: 1339.6 [I.V.:1139.6; IV Piggyback:200] Out: 1490 [Urine:1440; Emesis/NG output:50] Intake/Output this shift: Total I/O In: -  Out: 125 [Urine:125] Nutritional status:  Diet Order            Diet NPO time specified  Diet effective now              Neurologic Exam: Ment: Eyes are spontaneously open with upward deviation of globes and no purposeful response to visual stimuli. No extremity movement spontaneously or to any stimuli. No response to verbal questions or commands. No attempts to communicate.  CN: Pupils unreactive. Weak doll's eye reflex. Eyes conjugate at midline with no nystagmus. Face flaccidly symmetric.  Motor/Sensory: Flaccid tone x 4. No asymmetry. No movement of extremities to any stimuli. Reflexes: Hypoactive. Toes mute.    Lab Results: Results for orders placed or performed during the hospital encounter of 10/06/2018 (from the past 48 hour(s))  Glucose, capillary     Status: Abnormal   Collection Time: 10/19/18  3:46 PM  Result Value Ref Range   Glucose-Capillary 135 (H) 70 - 99 mg/dL  Glucose, capillary     Status: Abnormal   Collection Time: 10/19/18  9:10 PM  Result Value Ref Range   Glucose-Capillary 153 (H) 70 - 99 mg/dL  Glucose, capillary     Status: Abnormal   Collection Time: 10/20/18 12:16 AM  Result Value Ref Range   Glucose-Capillary 178 (H) 70 - 99  mg/dL  Glucose, capillary     Status: Abnormal   Collection Time: 10/20/18  4:05 AM  Result Value Ref Range   Glucose-Capillary 155 (H) 70 - 99 mg/dL  Basic metabolic panel     Status: Abnormal   Collection Time: 10/20/18  6:02 AM  Result Value Ref Range   Sodium 136 135 - 145 mmol/L   Potassium 4.1 3.5 - 5.1 mmol/L   Chloride 109 98 - 111 mmol/L   CO2 16 (L) 22 - 32 mmol/L   Glucose, Bld 172 (H) 70 - 99 mg/dL   BUN 50 (H) 8 - 23 mg/dL   Creatinine, Ser 2.44 (H) 0.61 - 1.24 mg/dL   Calcium 8.4 (L) 8.9 - 10.3 mg/dL   GFR calc non Af Amer 24 (L) >60 mL/min   GFR calc Af Amer 28 (L) >60 mL/min   Anion gap 11 5 - 15    Comment: Performed at [facility], [address] [place] [address]  CBC     Status: Abnormal   Collection Time: 10/20/18  6:02 AM  Result Value Ref Range   WBC 18.7 (H) 4.0 - 10.5 K/uL   RBC 3.35 (L) 4.22 - 5.81 MIL/uL   Hemoglobin 10.4 (L) 13.0 - 17.0 g/dL   HCT 30.8 (L) 39.0 -  52.0 %   MCV 91.9 80.0 - 100.0 fL   MCH 31.0 26.0 - 34.0 pg   MCHC 33.8 30.0 - 36.0 g/dL   RDW 13.8 11.5 - 15.5 %   Platelets 73 (L) 150 - 400 K/uL    Comment: REPEATED TO VERIFY Immature Platelet Fraction may be clinically indicated, consider ordering this additional test [id number] CONSISTENT WITH PREVIOUS RESULT    nRBC 0.0 0.0 - 0.2 %    Comment: Performed at [facility], [place] [address] [place], [place] [address]  Lactic acid, plasma     Status: None   Collection Time: 10/20/18  6:02 AM  Result Value Ref Range   Lactic Acid, Venous 1.8 0.5 - 1.9 mmol/L    Comment: Performed at [facility] [address] [place], [place] [address]  Glucose, capillary     Status: Abnormal   Collection Time: 10/20/18  8:28 AM  Result Value Ref Range   Glucose-Capillary 161 (H) 70 - 99 mg/dL  Glucose, capillary     Status: Abnormal   Collection Time: 10/20/18 12:15 PM  Result Value Ref Range   Glucose-Capillary 139 (H) 70 - 99 mg/dL  Glucose, capillary      Status: Abnormal   Collection Time: 10/20/18  4:00 PM  Result Value Ref Range   Glucose-Capillary 121 (H) 70 - 99 mg/dL  Glucose, capillary     Status: Abnormal   Collection Time: 10/20/18  8:31 PM  Result Value Ref Range   Glucose-Capillary 133 (H) 70 - 99 mg/dL  Glucose, capillary     Status: Abnormal   Collection Time: 10-30-2018 12:06 AM  Result Value Ref Range   Glucose-Capillary 146 (H) 70 - 99 mg/dL  Glucose, capillary     Status: Abnormal   Collection Time: October 30, 2018  4:11 AM  Result Value Ref Range   Glucose-Capillary 127 (H) 70 - 99 mg/dL  Glucose, capillary     Status: Abnormal   Collection Time: 10/30/18  8:34 AM  Result Value Ref Range   Glucose-Capillary 179 (H) 70 - 99 mg/dL   Comment 1 Notify RN     Recent Results (from the past 240 hour(s))  Culture, blood (routine x 2)     Status: None (Preliminary result)   Collection Time: 09/20/2018  3:03 PM  Result Value Ref Range Status   Specimen Description BLOOD LEFT HAND  Final   Special Requests   Final    BOTTLES DRAWN AEROBIC ONLY Blood Culture results may not be optimal due to an inadequate volume of blood received in culture bottles   Culture   Final    NO GROWTH 3 DAYS Performed at [facility], [place] [address] [place], [place] [address]    Report Status PENDING  Incomplete  Culture, blood (routine x 2)     Status: None (Preliminary result)   Collection Time: 10/07/2018  3:10 PM  Result Value Ref Range Status   Specimen Description BLOOD RIGHT HAND  Final   Special Requests   Final    BOTTLES DRAWN AEROBIC ONLY Blood Culture results may not be optimal due to an inadequate volume of blood received in culture bottles   Culture   Final    NO GROWTH 3 DAYS Performed at [facility], [place] [address] [place], [place] [address]    Report Status PENDING  Incomplete  Urine culture     Status: None   Collection Time: 10/14/2018  3:31 PM  Result Value Ref Range Status   Specimen Description URINE, CATHETERIZED   Final   Special Requests NONE  Final   Culture   Final    NO GROWTH Performed at [facility], [address] [place], [place] [address]    Report Status 10/19/2018 FINAL  Final  Respiratory Panel by PCR     Status: None   Collection Time: 09/18/2018  4:02 PM  Result Value Ref Range Status   Adenovirus NOT DETECTED NOT DETECTED Final   Coronavirus 229E NOT DETECTED NOT DETECTED Final    Comment: (NOTE) The Coronavirus on the Respiratory Panel, DOES NOT test for the novel  Coronavirus (2019 nCoV)    Coronavirus HKU1 NOT DETECTED NOT DETECTED Final   Coronavirus NL63 NOT DETECTED NOT DETECTED Final   Coronavirus OC43 NOT DETECTED NOT DETECTED Final   Metapneumovirus NOT DETECTED NOT DETECTED Final   Rhinovirus / Enterovirus NOT DETECTED NOT DETECTED Final   Influenza A NOT DETECTED NOT DETECTED Final   Influenza B NOT DETECTED NOT DETECTED Final   Parainfluenza Virus 1 NOT DETECTED NOT DETECTED Final   Parainfluenza Virus 2 NOT DETECTED NOT DETECTED Final   Parainfluenza Virus 3 NOT DETECTED NOT DETECTED Final   Parainfluenza Virus 4 NOT DETECTED NOT DETECTED Final   Respiratory Syncytial Virus NOT DETECTED NOT DETECTED Final   Bordetella pertussis NOT DETECTED NOT DETECTED Final   Chlamydophila pneumoniae NOT DETECTED NOT DETECTED Final   Mycoplasma pneumoniae NOT DETECTED NOT DETECTED Final    Comment: Performed at [facility], [address], [place], [place] [address]  MRSA PCR Screening     Status: None   Collection Time: 10/17/2018  4:02 PM  Result Value Ref Range Status   MRSA by PCR NEGATIVE NEGATIVE Final    Comment:        The GeneXpert MRSA Assay (FDA approved for NASAL specimens only), is one component of a comprehensive MRSA colonization surveillance program. It is not intended to diagnose MRSA infection nor to guide or monitor treatment for MRSA infections. Performed at [facility], [place] [address] [place], [place] [address]     Lipid  Panel No results for input(s): CHOL, TRIG, HDL, CHOLHDL, VLDL, LDLCALC in the last 72 hours.  Studies/Results: Dg Chest Port 1 View  Result Date: 10/20/2018 CLINICAL DATA:  ET tube, respiratory failure EXAM: PORTABLE CHEST 1 VIEW COMPARISON:  10/19/2018 FINDINGS: Support devices are stable. Cardiomegaly. Moderate right pleural effusion. Right lower lobe atelectasis or infiltrate. Effusion and airspace opacity have slightly increased since prior study. No confluent opacity on the left. No acute bony abnormality. IMPRESSION: Moderate right pleural effusion and right lower lobe airspace disease, both slightly increased since prior study. Electronically Signed   By: Rolm Baptise M.D.   On: 10/20/2018 07:42    Medications:  Scheduled: . chlorhexidine gluconate (MEDLINE KIT)  15 mL Mouth Rinse BID  . clonazepam  0.5 mg Per Tube BID  . dexamethasone  4 mg Intravenous Q6H  . insulin aspart  0-20 Units Subcutaneous Q4H  . mouth rinse  15 mL Mouth Rinse 10 times per day  . pantoprazole (PROTONIX) IV  40 mg Intravenous Q24H   Continuous: . sodium chloride 50 mL/hr at 11-Nov-2018 0400  . sodium chloride 10 mL/hr at 10/17/2018 2100  . dextrose    . levETIRAcetam Stopped (10/20/18 2128)  . morphine 2 mg/hr (Nov 11, 2018 1103)  . norepinephrine (LEVOPHED) Adult infusion Stopped (10/19/18 1431)  . propofol (DIPRIVAN)  infusion Stopped (10/20/18 1050)    Assessment:78 year old man with anoxic brain injury. He has widely metastatic small cell lung cancer including mets to brain. 1. Family wishes comfort care measures.  3. Prognosis is poor from a neurological standpoint.   Recommendations: 1. Comfort care measures per family. 2. Neurology will sign off   LOS: 3 days   _0  signed: Dr. Kerney Elbe 11/05/2018  12:09 PM

## 2018-11-18 NOTE — Progress Notes (Signed)
Nutrition Brief Note  Chart reviewed. Pt now transitioning to comfort care.  No further nutrition interventions warranted at this time.  Please re-consult as needed.   Mariana Single RD, LDN Clinical Nutrition Pager # [phone]

## 2018-11-18 NOTE — Procedures (Signed)
Extubation Procedure Note  Patient Details:   Name: Arthur [name] DOB: 1941-02-18 MRN: [id number]   Airway Documentation:    Vent end date: 2018/11/14 Vent end time: 1112   Evaluation  O2 sats: currently acceptable Complications: No apparent complications Patient did not tolerate procedure well. Bilateral Breath Sounds: Diminished, Rhonchi   No.  Pt extubated to comfort care per physician order and family request. Pt suctioned via ETT and orally prior to extubation. No stridor noted, no cough and pt unable to state name at this time. RN at bedside.   Sharla Kidney 11/14/18, 11:31 AM

## 2018-11-18 NOTE — Progress Notes (Signed)
PCCM Interval Progress Note  Called pt's daughter, Arthur [name] again.  Unfortunately no improvement in pt's status today.   Per Arthur [name], we will proceed with terminal extubation and full comfort measures.  RN called and updated on plan of care.  RN to call Arthur [name] after pt expires (she does not wish to come to hospital and see her father in his current state).   Montey Hora, [facility] Pager: [phone].  If no answer, [phone] 10/25/18, 10:56 AM

## 2018-11-18 DEATH — deceased

## 2018-11-21 ENCOUNTER — Other Ambulatory Visit: Payer: Self-pay | Admitting: Cardiovascular Disease

## 2018-11-22 ENCOUNTER — Telehealth: Payer: Self-pay | Admitting: *Deleted

## 2018-11-22 NOTE — Telephone Encounter (Signed)
Received original D/C from [facility]-D/C will be forwarded to [facility] on 11/24/18 ([place]).

## 2018-11-24 NOTE — Telephone Encounter (Signed)
Received original signed D/C-D/C Mailed to [facility] vital records in self stamped envelope as requested

## 2018-12-06 ENCOUNTER — Ambulatory Visit: Payer: Medicare Other | Admitting: Internal Medicine

## 2018-12-06 ENCOUNTER — Ambulatory Visit: Payer: Medicare Other

## 2018-12-06 ENCOUNTER — Other Ambulatory Visit: Payer: Medicare Other

## 2018-12-27 ENCOUNTER — Ambulatory Visit: Payer: Medicare Other

## 2018-12-27 ENCOUNTER — Ambulatory Visit: Payer: Medicare Other | Admitting: Internal Medicine

## 2018-12-27 ENCOUNTER — Other Ambulatory Visit: Payer: Medicare Other

## 2020-01-24 IMAGING — MR MR HEAD WO/W CM
10 of 13 series · 37 of 48 positions shown · IV contrast (Yes)
Comparison: 02/10/2017

CLINICAL DATA: Small-cell lung cancer staging.

EXAM:
MRI HEAD WITHOUT AND WITH CONTRAST
TECHNIQUE: Multiplanar, multiecho pulse sequences of the brain and surrounding
structures were obtained without and with intravenous contrast.
CONTRAST:  7 mL Gadavist

[Series 3: DWI · axial · 3.0mm · 1.09mm/px · z∈[-29,+133]mm · 9 of 110 slices shown (1 of 4)]
[im 1/110]
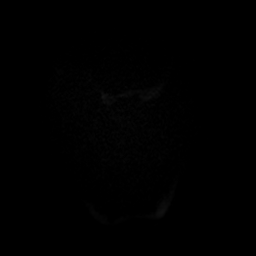
[im 14/110]
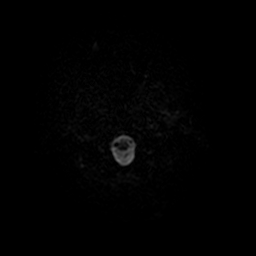
[im 28/110]
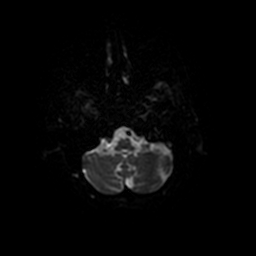
[im 41/110]
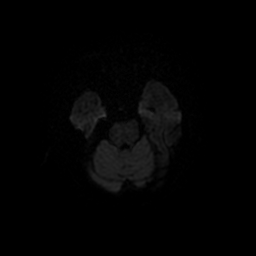
[im 55/110]
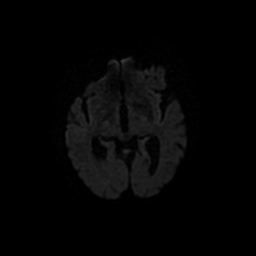
[im 69/110]
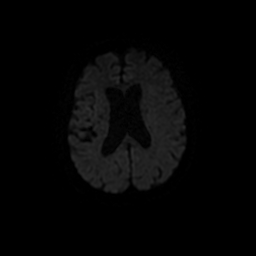
[im 82/110]
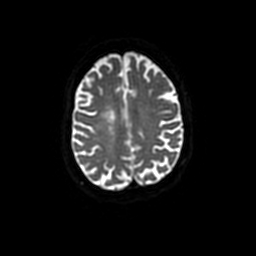
[im 96/110]
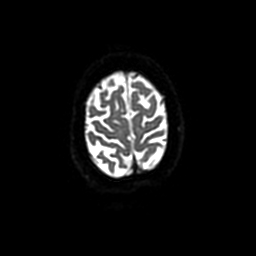
[im 110/110]
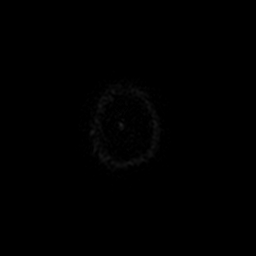

[Series 4: T1 · sagittal · 5.0mm · 0.47mm/px · 1 of 24 slices shown]
[im 1/24]
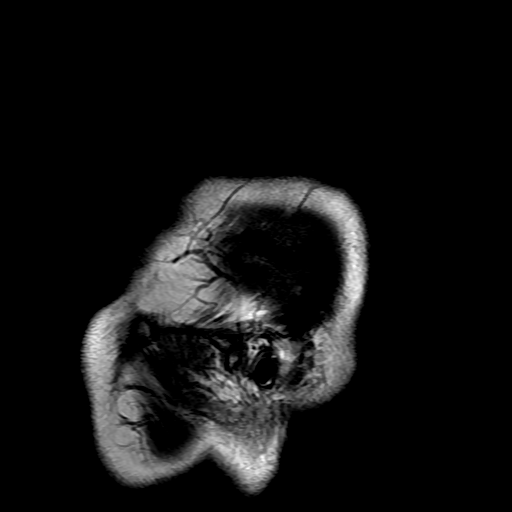

[Series 5: DWI · coronal · 3.0mm · 1.09mm/px · 9 of 110 slices shown (2 of 4)]
[im 1/110]
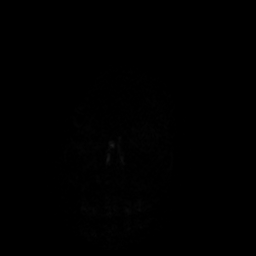
[im 14/110]
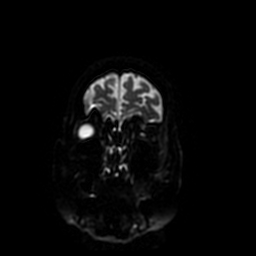
[im 28/110]
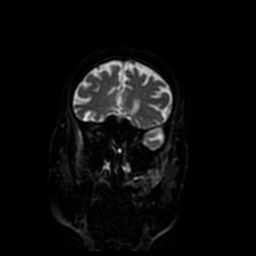
[im 41/110]
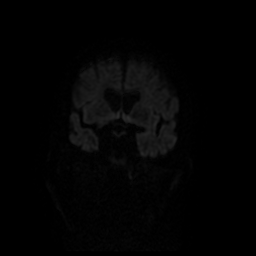
[im 55/110]
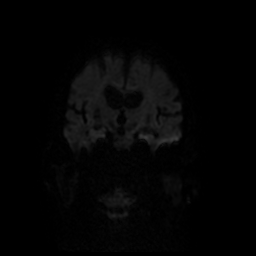
[im 69/110]
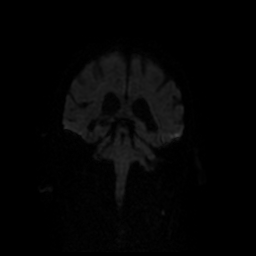
[im 82/110]
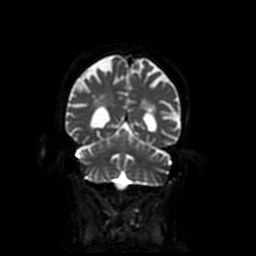
[im 96/110]
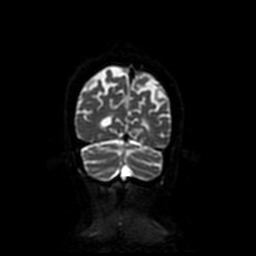
[im 110/110]
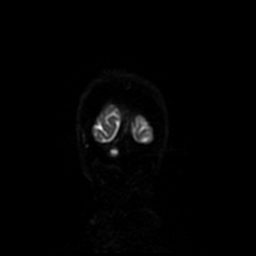

[Series 6: T2 · axial · 5.0mm · 0.43mm/px · z∈[-24,+137]mm · 2 of 24 slices shown]
[im 1/24]
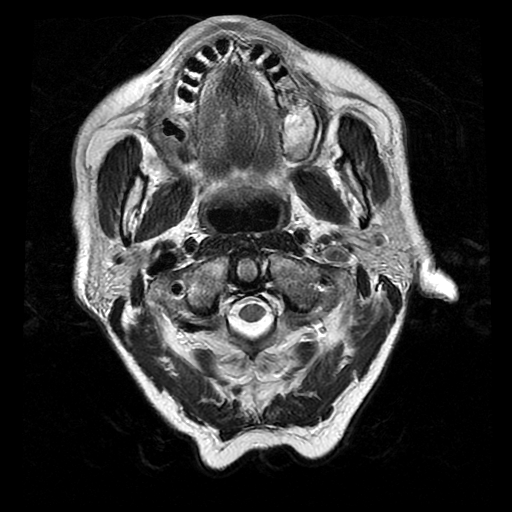
[im 24/24]
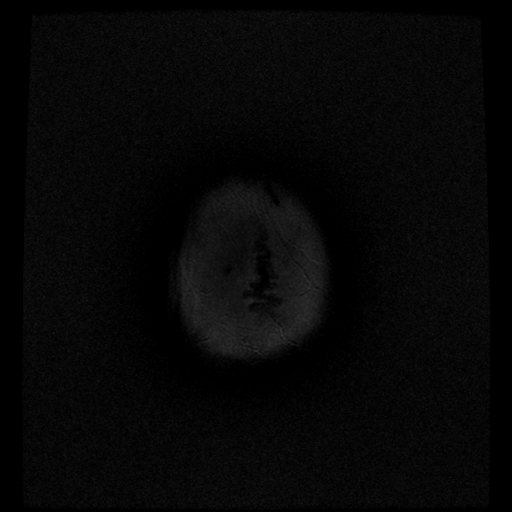

[Series 7: FLAIR · axial · 3.0mm · 0.43mm/px · z∈[-31,+131]mm · 2 of 28 slices shown]
[im 1/28]
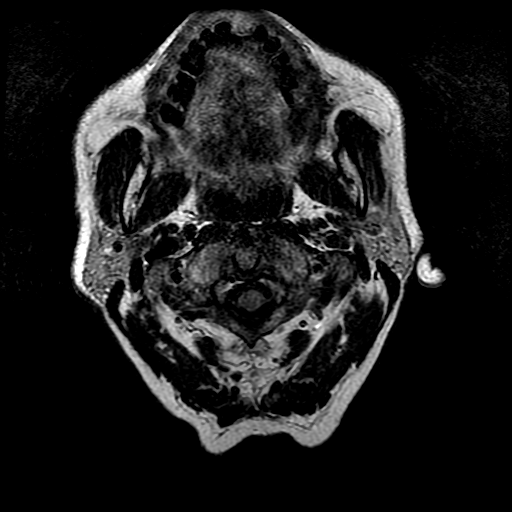
[im 28/28]
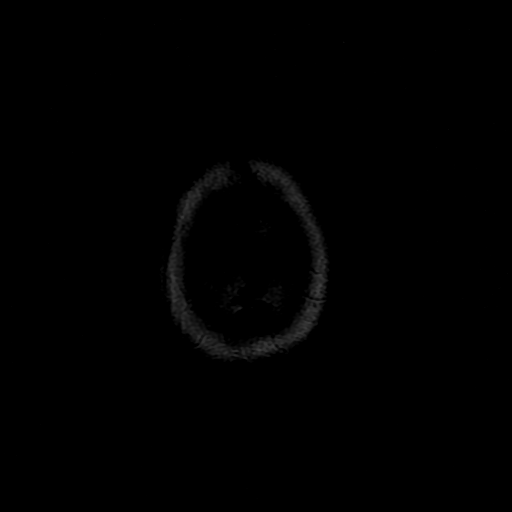

[Series 10: T2 post-contrast · coronal · 5.0mm · 0.45mm/px · 2 of 26 slices shown]
[im 1/26]
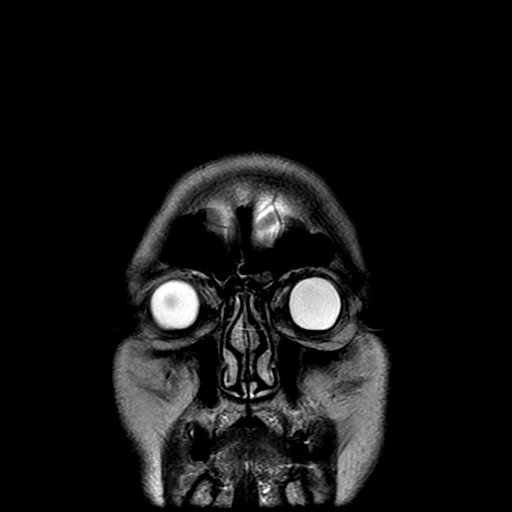
[im 26/26]
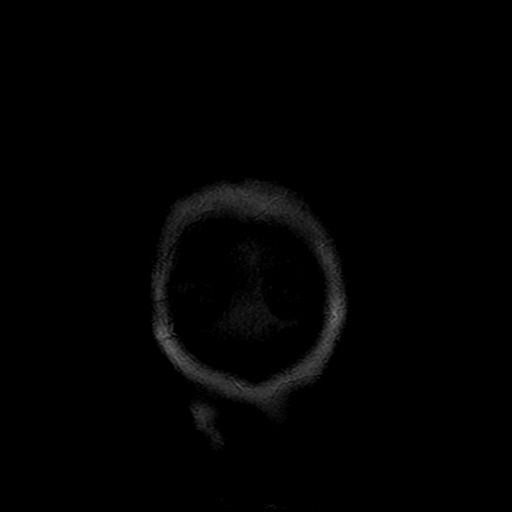

[Series 12: T1 post-contrast · coronal · 5.0mm · 0.45mm/px · 2 of 26 slices shown (1 of 2)]
[im 1/26]
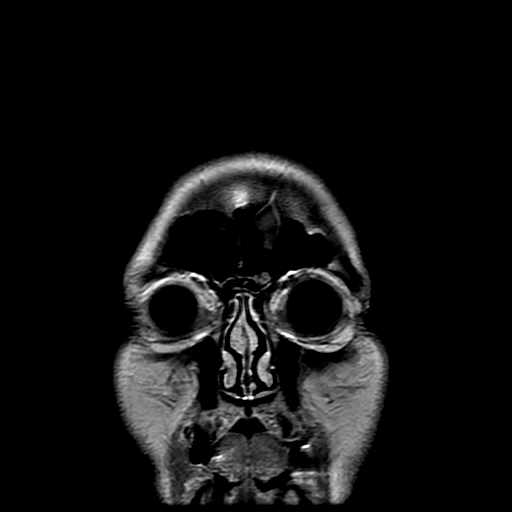
[im 26/26]
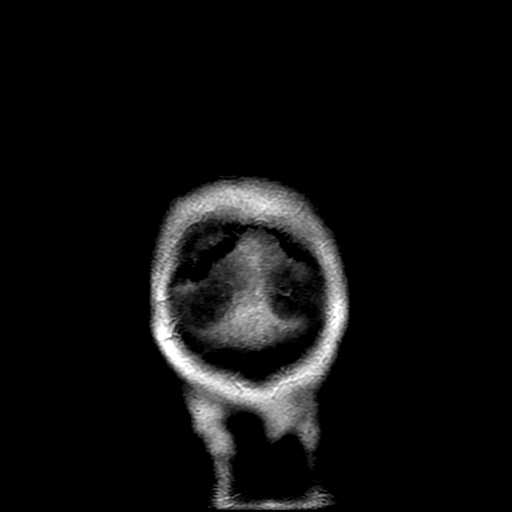

[Series 13: T1 post-contrast · sagittal · 5.0mm · 0.47mm/px · 2 of 24 slices shown (2 of 2)]
[im 1/24]
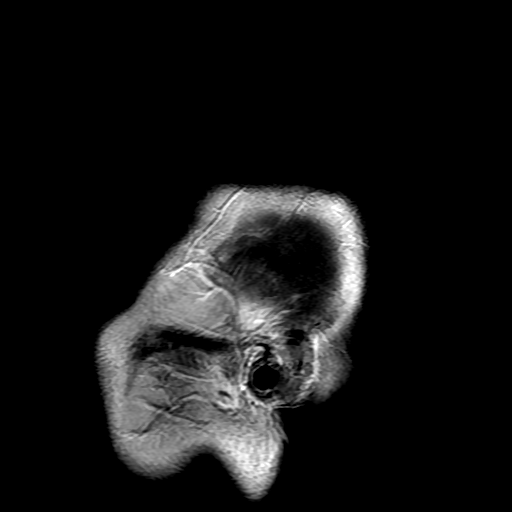
[im 24/24]
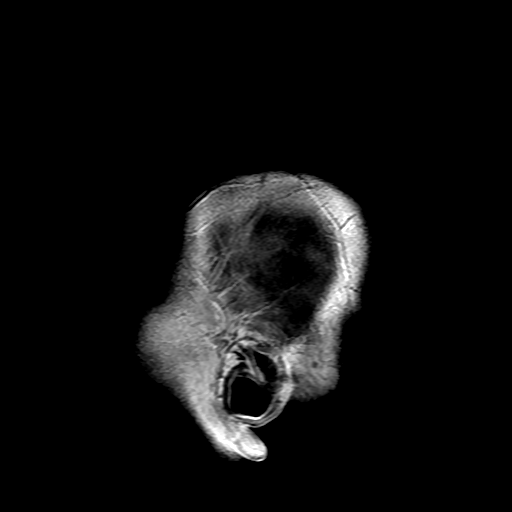

[Series 300: DWI · axial · 3.0mm · 1.09mm/px · z∈[-29,+133]mm · 4 of 55 slices shown (3 of 4)]
[im 1/55]
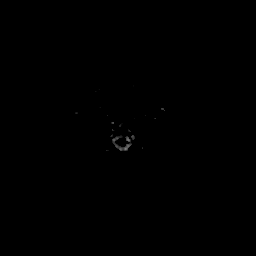
[im 19/55]
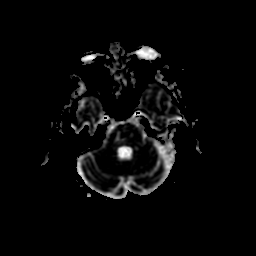
[im 37/55]
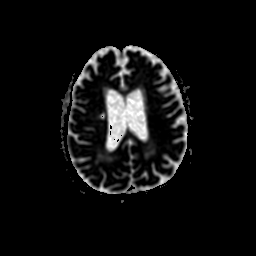
[im 55/55]
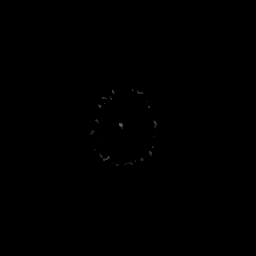

[Series 500: DWI · coronal · 3.0mm · 1.09mm/px · 4 of 55 slices shown (4 of 4)]
[im 1/55]
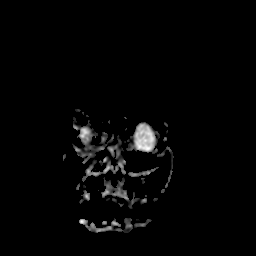
[im 19/55]
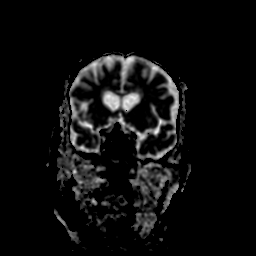
[im 37/55]
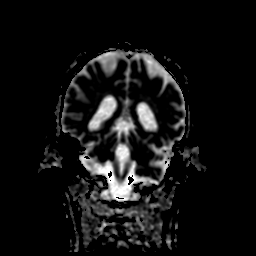
[im 55/55]
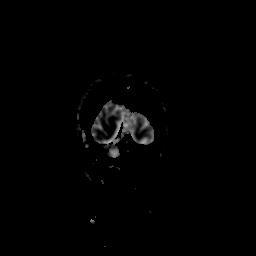

[37 of 48 positions shown; findings below may reference images not displayed]

FINDINGS: Brain: There is no evidence of acute infarct, intracranial
hemorrhage, mass, midline shift, or extra-axial fluid collection.
There is mild to moderate cerebral atrophy. Chronic lacunar infarcts
are present in the corona radiata bilaterally, left basal ganglia,
left periatrial white matter, and right pons, unchanged. Additional
patchy T2 hyperintensities in the cerebral white matter and pons are
nonspecific but compatible with moderate chronic small vessel
ischemic disease, progressed in the pons from the prior study with a
new chronic lacunar infarct on the left. No abnormal enhancement is
identified.

Vascular: Major intracranial vascular flow voids are preserved.

Skull and upper cervical spine: No suspicious calvarial lesion.
Extensive marrow abnormality with heterogeneous enhancement
involving the C2 vertebral body and portions of the dens and
posterior elements, new from the prior study. Suspected new marrow
abnormality in the included superior portion of the C3 vertebral
body is well.

Sinuses/Orbits: Unremarkable orbits. Minimal scattered paranasal
sinus mucosal thickening. Clear mastoid air cells.

Other: None.
IMPRESSION: 1. No evidence of intracranial metastases.
2. Bone metastases in the upper cervical spine.
3. Mild-to-moderate chronic small vessel ischemic disease and
cerebral atrophy.
